# Patient Record
Sex: Female | Born: 1935
Health system: Southern US, Community
[De-identification: ages and names within clinical notes are randomized; demographics above are authoritative.]

## PROBLEM LIST (undated history)

## (undated) DIAGNOSIS — F329 Major depressive disorder, single episode, unspecified: Secondary | ICD-10-CM

## (undated) DIAGNOSIS — I509 Heart failure, unspecified: Secondary | ICD-10-CM

## (undated) DIAGNOSIS — I10 Essential (primary) hypertension: Secondary | ICD-10-CM

## (undated) DIAGNOSIS — I739 Peripheral vascular disease, unspecified: Secondary | ICD-10-CM

## (undated) DIAGNOSIS — L570 Actinic keratosis: Secondary | ICD-10-CM

## (undated) DIAGNOSIS — I8390 Asymptomatic varicose veins of unspecified lower extremity: Secondary | ICD-10-CM

## (undated) DIAGNOSIS — W009XXA Unspecified fall due to ice and snow, initial encounter: Secondary | ICD-10-CM

## (undated) DIAGNOSIS — I499 Cardiac arrhythmia, unspecified: Secondary | ICD-10-CM

## (undated) DIAGNOSIS — I6529 Occlusion and stenosis of unspecified carotid artery: Secondary | ICD-10-CM

## (undated) DIAGNOSIS — K635 Polyp of colon: Secondary | ICD-10-CM

## (undated) DIAGNOSIS — R7611 Nonspecific reaction to tuberculin skin test without active tuberculosis: Secondary | ICD-10-CM

## (undated) DIAGNOSIS — E079 Disorder of thyroid, unspecified: Secondary | ICD-10-CM

## (undated) DIAGNOSIS — I350 Nonrheumatic aortic (valve) stenosis: Secondary | ICD-10-CM

## (undated) DIAGNOSIS — R011 Cardiac murmur, unspecified: Secondary | ICD-10-CM

## (undated) DIAGNOSIS — J189 Pneumonia, unspecified organism: Secondary | ICD-10-CM

## (undated) DIAGNOSIS — J029 Acute pharyngitis, unspecified: Secondary | ICD-10-CM

## (undated) DIAGNOSIS — T7840XA Allergy, unspecified, initial encounter: Secondary | ICD-10-CM

## (undated) DIAGNOSIS — Z780 Asymptomatic menopausal state: Secondary | ICD-10-CM

## (undated) DIAGNOSIS — C9000 Multiple myeloma not having achieved remission: Secondary | ICD-10-CM

## (undated) HISTORY — DX: Cardiac arrhythmia, unspecified: I49.9

## (undated) HISTORY — DX: Pneumonia, unspecified organism: J18.9

## (undated) HISTORY — DX: Acute pharyngitis, unspecified: J02.9

## (undated) HISTORY — DX: Actinic keratosis: L57.0

## (undated) HISTORY — DX: Asymptomatic varicose veins of unspecified lower extremity: I83.90

## (undated) HISTORY — DX: Peripheral vascular disease, unspecified: I73.9

## (undated) HISTORY — DX: Cardiac murmur, unspecified: R01.1

## (undated) HISTORY — DX: Allergy, unspecified, initial encounter: T78.40XA

## (undated) HISTORY — DX: Occlusion and stenosis of unspecified carotid artery: I65.29

## (undated) HISTORY — DX: Nonrheumatic aortic (valve) stenosis: I35.0

## (undated) HISTORY — PX: TONSILLECTOMY: SUR1361

## (undated) HISTORY — PX: ABDOMINAL HYSTERECTOMY: SHX81

## (undated) HISTORY — DX: Essential (primary) hypertension: I10

## (undated) HISTORY — DX: Unspecified fall due to ice and snow, initial encounter: W00.9XXA

## (undated) HISTORY — DX: Polyp of colon: K63.5

## (undated) HISTORY — DX: Major depressive disorder, single episode, unspecified: F32.9

## (undated) HISTORY — DX: Nonspecific reaction to tuberculin skin test without active tuberculosis: R76.11

## (undated) HISTORY — DX: Disorder of thyroid, unspecified: E07.9

## (undated) HISTORY — DX: Multiple myeloma not having achieved remission: C90.00

## (undated) HISTORY — DX: Asymptomatic menopausal state: Z78.0

## (undated) HISTORY — PX: APPENDECTOMY: SHX54

## (undated) HISTORY — PX: VEIN SURGERY: SHX48

## (undated) HISTORY — DX: Heart failure, unspecified: I50.9

---

## 1962-10-02 HISTORY — PX: BILATERAL SALPINGOOPHORECTOMY: SHX1223

## 2001-12-17 ENCOUNTER — Encounter: Payer: Self-pay | Admitting: Orthopedic Surgery

## 2001-12-17 ENCOUNTER — Ambulatory Visit (HOSPITAL_COMMUNITY): Admission: RE | Admit: 2001-12-17 | Discharge: 2001-12-17 | Payer: Self-pay | Admitting: Orthopedic Surgery

## 2001-12-19 ENCOUNTER — Inpatient Hospital Stay (HOSPITAL_COMMUNITY): Admission: RE | Admit: 2001-12-19 | Discharge: 2001-12-24 | Payer: Self-pay | Admitting: Orthopedic Surgery

## 2001-12-23 ENCOUNTER — Encounter: Payer: Self-pay | Admitting: Orthopedic Surgery

## 2004-09-06 ENCOUNTER — Ambulatory Visit: Payer: Self-pay | Admitting: Unknown Physician Specialty

## 2004-11-23 ENCOUNTER — Ambulatory Visit: Payer: Self-pay | Admitting: Internal Medicine

## 2006-03-20 ENCOUNTER — Ambulatory Visit: Payer: Self-pay | Admitting: Internal Medicine

## 2006-04-01 ENCOUNTER — Emergency Department: Payer: Self-pay | Admitting: General Practice

## 2007-05-09 ENCOUNTER — Ambulatory Visit: Payer: Self-pay | Admitting: Internal Medicine

## 2007-12-13 ENCOUNTER — Ambulatory Visit: Payer: Self-pay | Admitting: Family Medicine

## 2008-05-11 ENCOUNTER — Ambulatory Visit: Payer: Self-pay | Admitting: Internal Medicine

## 2008-11-09 ENCOUNTER — Ambulatory Visit: Payer: Self-pay | Admitting: General Surgery

## 2008-11-18 ENCOUNTER — Ambulatory Visit: Payer: Self-pay | Admitting: General Surgery

## 2009-02-20 LAB — HM COLONOSCOPY: HM COLON: NORMAL

## 2009-05-12 ENCOUNTER — Ambulatory Visit: Payer: Self-pay | Admitting: General Surgery

## 2009-08-12 ENCOUNTER — Ambulatory Visit: Payer: Self-pay | Admitting: Internal Medicine

## 2009-10-07 ENCOUNTER — Ambulatory Visit: Payer: Self-pay | Admitting: Internal Medicine

## 2010-05-31 ENCOUNTER — Ambulatory Visit: Payer: Self-pay | Admitting: Internal Medicine

## 2011-06-19 ENCOUNTER — Ambulatory Visit: Payer: Medicare Other | Admitting: Internal Medicine

## 2011-08-04 ENCOUNTER — Other Ambulatory Visit: Payer: Self-pay

## 2011-08-04 DIAGNOSIS — I6529 Occlusion and stenosis of unspecified carotid artery: Secondary | ICD-10-CM

## 2011-08-18 ENCOUNTER — Encounter: Payer: Self-pay | Admitting: Vascular Surgery

## 2011-09-14 ENCOUNTER — Encounter: Payer: Self-pay | Admitting: Vascular Surgery

## 2011-09-15 ENCOUNTER — Other Ambulatory Visit (INDEPENDENT_AMBULATORY_CARE_PROVIDER_SITE_OTHER): Payer: Medicare Other | Admitting: *Deleted

## 2011-09-15 ENCOUNTER — Encounter: Payer: Self-pay | Admitting: Vascular Surgery

## 2011-09-15 ENCOUNTER — Ambulatory Visit (INDEPENDENT_AMBULATORY_CARE_PROVIDER_SITE_OTHER): Payer: Medicare Other | Admitting: Vascular Surgery

## 2011-09-15 VITALS — BP 170/87 | HR 65 | Resp 16 | Ht 64.0 in | Wt 127.0 lb

## 2011-09-15 DIAGNOSIS — I6529 Occlusion and stenosis of unspecified carotid artery: Secondary | ICD-10-CM

## 2011-09-15 DIAGNOSIS — I6523 Occlusion and stenosis of bilateral carotid arteries: Secondary | ICD-10-CM

## 2011-09-15 DIAGNOSIS — I739 Peripheral vascular disease, unspecified: Secondary | ICD-10-CM | POA: Insufficient documentation

## 2011-09-15 NOTE — Progress Notes (Signed)
[facility] [place]  New Carotid Patient  Referred by:  Dr. Beverely Risen  Reason for referral: B carotid stenosis  History of Present Illness  Deanna White is a 77 y.o. female who presents with chief complaint: abnormal carotid duplex.  Previous carotid studies demonstrated: RICA  minimal stenosis, LICA 50-60% stenosis.  Patient has no history of TIA or stroke symptom.  The patient has never had amaurosis fugax or monocular blindness.  The patient has never had facial drooping or hemiplegia.  The patient has never had receptive or expressive aphasia.   The patient's previous neurologic deficits have resolved.  The patient's risks factors for carotid disease include: HTN.  Past Medical History  Diagnosis Date  . Carotid artery occlusion   . Depression   . Hypertension   . Thyroid disease   . Menopause   . Allergy   . Heart murmur   . Varicose veins   . Sore throat   . Pneumonia     Past Surgical History  Procedure Date  . Tonsillectomy   . Cesarean section   . Appendectomy   . Abdominal hysterectomy     History   Social History  . Marital Status: Divorced    Spouse Name: N/A    Number of Children: N/A  . Years of Education: N/A   Occupational History  . Not on file.   Social History Main Topics  . Smoking status: Never Smoker   . Smokeless tobacco: Not on file  . Alcohol Use: No  . Drug Use: No  . Sexually Active:    Other Topics Concern  . Not on file   Social History Narrative  . No narrative on file    Family History  Problem Relation Age of Onset  . Heart disease Mother   . Kidney disease Father   . Alcohol abuse Brother   . Cancer Brother   . Other Other     epilepsy  . Hypertension Other     Current Outpatient Prescriptions on File Prior to Visit  Medication Sig Dispense Refill  . Ascorbic Acid (VITAMIN C PO) Take by mouth.        Marland Kitchen aspirin EC 81 MG tablet Take 81 mg by mouth daily.        . Cholecalciferol (VITAMIN  D PO) Take by mouth daily.        Marland Kitchen levothyroxine (SYNTHROID, LEVOTHROID) 75 MCG tablet Take 75 mcg by mouth daily.        . Multiple Vitamins-Calcium (ONE-A-DAY WOMENS PO) Take by mouth daily.        Marland Kitchen telmisartan (MICARDIS) 40 MG tablet Take 40 mg by mouth daily.        Marland Kitchen HYDRALAZINE-HCTZ PO Take by mouth daily.          Allergies  Allergen Reactions  . Adhesive (Tape)     REVIEW OF SYSTEMS:  (Positives checked otherwise negative)  CARDIOVASCULAR: [ ]  chest pain    [ ]  chest pressure    [ ]  palpitations    [ ]  orthopnea   [x]  dyspnea on exert. [ ]  claudication    [ ]  rest pain     [ ]  DVT     [ ]  phlebitis  PULMONARY:    [ ]  productive cough [ ]  asthma  [ ]  wheezing  NEUROLOGIC:    [ ]  weakness    [ ]  paresthesias   [ ]  aphasia    [ ]  amaurosis    [ ]   dizziness  HEMATOLOGIC:    [ ]  bleeding problems  [ ]  clotting disorders  MUSCULOSKEL: [ ]  joint pain     [ ]  joint swelling  GASTROINTEST:  [ ]   blood in stool   [ ]   hematemesis  GENITOURINARY:   [ ]   dysuria    [ ]   hematuria  PSYCHIATRIC:   [ ]  history of major depression  INTEGUMENTARY: [ ]  rashes    [ ]  ulcers  CONSTITUTIONAL:  [ ]  fever     [ ]  chills  Physical Examination  Filed Vitals:   09/15/11 1117 09/15/11 1120  BP: 167/76 170/87  Pulse: 70 65  Resp: 16   Height: 5\' 4"  (1.626 m)   Weight: 127 lb (57.607 kg)   SpO2: 99% 100%   Body mass index is 21.80 kg/(m^2).  General: A&O x 3, WDWN, elderly, Cachectic   Head: [place]/AT, B temporalis wasting  Ear/Nose/Throat: Hearing grossly intact, nares w/o erythema or drainage, oropharynx w/o Erythema/Exudate  Eyes: PERRLA, EOMI  Neck: Supple, no nuchal rigidity, no palpable LAD  Pulmonary: Sym exp, good air movt, CTAB, no rales, rhonchi, & wheezing  Cardiac: RRR, Nl S1, S2, no Murmurs, rubs or gallops  Vascular: Vessel Right Left  Radial Palpable Palpable  Brachial Palpable Palpable  Carotid Palpable, without bruit Palpable, without bruit  Aorta  Non-palpable N/A  Femoral Palpable Palpable  Popliteal Non-palpable Non-palpable  PT Palpable Palpable  DP Palpable Palpable   Gastrointestinal: soft, NTND, -G/R, - HSM, - masses, - CVAT B  Musculoskeletal: M/S 5/5 throughout , Extremities without ischemic changes   Neurologic: CN 2-12 intact , Pain and light touch intact in extremities , Motor exam as listed above  Psychiatric: Judgment intact, Mood & affect appropriate for pt's clinical situation  Dermatologic: See M/S exam for extremity exam, no rashes otherwise noted  Lymph : No Cervical, Axillary, or Inguinal lymphadenopathy   Non-Invasive Vascular Imaging  CAROTID DUPLEX (Date: 09/15/11):   R ICA stenosis: <50%  R VA: patent and antegrade  L ICA stenosis: <50%  L VA: patent and antegrade  Outside Studies/Documentation 6 pages of outside documents were reviewed including: clinic chart and outside duplex report.  Medical Decision Making  Deanna White is a 77 y.o. female who presents with: <50% B ICA stenosis.   Based on the patient's vascular studies and examination, I have offered the patient: continued carotid surveillance. Based on ACAS data, there is no indication for intervention with <50% lesions bilaterally.  I discussed in depth with the patient the nature of atherosclerosis, and emphasized the importance of maximal medical management including strict control of blood pressure, blood glucose, and lipid levels, obtaining regular exercise, antiplatelet agents, and cessation of smoking.  The patient is aware that without maximal medical management the underlying atherosclerotic disease process will progress, limiting the benefit of any interventions.  Thank you for allowing [place] to participate in this patient's care.  Leonides Sake, MD [facility] [place] Office: [phone] Pager: [phone]  09/15/2011, 12:37 PM

## 2011-09-22 NOTE — Procedures (Unsigned)
CAROTID DUPLEX EXAM  INDICATION:  Carotid disease.  HISTORY: Diabetes:  No. Cardiac:  No. Hypertension:  Yes. Smoking:  No. Previous Surgery:  No. CV History:  Currently asymptomatic. Amaurosis Fugax No, Paresthesias No, Hemiparesis No.                                      RIGHT             LEFT Brachial systolic pressure:         180               182 Brachial Doppler waveforms:         Normal            Normal Vertebral direction of flow:        Antegrade         Antegrade DUPLEX VELOCITIES (cm/sec) CCA peak systolic                   69                69 ECA peak systolic                   96                87 ICA peak systolic                   61                P = 81/M = 121 ICA end diastolic                   17                T = 20/M = 34 PLAQUE MORPHOLOGY:                  Heterogenous      Heterogenous PLAQUE AMOUNT:                      Mild              Mild PLAQUE LOCATION:                    Proximal ICA      CCA/proximal ICA  IMPRESSION:  No hemodynamically significant stenoses of the bilateral proximal internal carotid arteries with plaque formations as described above.  There is a mild increase in velocity of the left mid internal carotid artery which appears to be due to vessel tortuosity.    ___________________________________________ Fransisco Hertz, MD  CH/MEDQ  D:  09/15/2011  T:  09/15/2011  Job:  [id number]

## 2012-06-19 ENCOUNTER — Ambulatory Visit: Payer: Self-pay | Admitting: Internal Medicine

## 2012-09-18 ENCOUNTER — Other Ambulatory Visit: Payer: Self-pay | Admitting: *Deleted

## 2012-09-18 DIAGNOSIS — I6529 Occlusion and stenosis of unspecified carotid artery: Secondary | ICD-10-CM

## 2012-09-19 ENCOUNTER — Encounter: Payer: Self-pay | Admitting: Vascular Surgery

## 2012-09-20 ENCOUNTER — Other Ambulatory Visit (INDEPENDENT_AMBULATORY_CARE_PROVIDER_SITE_OTHER): Payer: Medicare Other | Admitting: *Deleted

## 2012-09-20 ENCOUNTER — Encounter: Payer: Self-pay | Admitting: Vascular Surgery

## 2012-09-20 ENCOUNTER — Ambulatory Visit (INDEPENDENT_AMBULATORY_CARE_PROVIDER_SITE_OTHER): Payer: Medicare Other | Admitting: Vascular Surgery

## 2012-09-20 VITALS — BP 191/72 | HR 72 | Ht 64.0 in | Wt 128.0 lb

## 2012-09-20 DIAGNOSIS — I6529 Occlusion and stenosis of unspecified carotid artery: Secondary | ICD-10-CM

## 2012-09-20 DIAGNOSIS — I658 Occlusion and stenosis of other precerebral arteries: Secondary | ICD-10-CM

## 2012-09-20 DIAGNOSIS — I6523 Occlusion and stenosis of bilateral carotid arteries: Secondary | ICD-10-CM

## 2012-09-20 NOTE — Progress Notes (Signed)
[facility] [place]  Established Carotid Patient  History of Present Illness  Deanna White is a 78 y.o. (1925/08/29) female who presents with chief complaint: left ear tinnitus.  Previous carotid studies demonstrated: RICA <50% stenosis, LICA <50% stenosis.  Patient has no history of TIA or stroke symptom.  The patient has never had amaurosis fugax or monocular blindness.  The patient has never had facial drooping or hemiplegia.  The patient has never had receptive or expressive aphasia.    Past Medical History, Past Surgical History, Social History, Family History, Medications, Allergies, and Review of Systems are unchanged from previous evaluation on 09/15/11.  Physical Examination  Filed Vitals:   09/20/12 1002 09/20/12 1004  BP: 186/57 191/72  Pulse: 72   Height: 5\' 4"  (1.626 m)   Weight: 128 lb (58.06 kg)   SpO2: 100%    Body mass index is 21.97 kg/(m^2).  General: A&O x 3, WD , elderly  Eyes: PERRLA, EOMI  Pulmonary: Sym exp, good air movt, CTAB, no rales, rhonchi, & wheezing  Cardiac: RRR, Nl S1, S2, no Murmurs, rubs or gallops  Vascular: Vessel Right Left  Radial Palpable Palpable  Ulnar Palpable Palpable  Brachial Palpable Palpable  Carotid Palpable, without bruit Palpable, without bruit  Aorta Not palpable N/A  Femoral Palpable Palpable  Popliteal Not palpable Not palpable  PT Palpable Palpable  DP Palpable Palpable   Gastrointestinal: soft, NTND, -G/R, - HSM, - masses, - CVAT B  Musculoskeletal: M/S 5/5 throughout , Extremities without ischemic changes   Neurologic: CN 2-12 intact , Pain and light touch intact in extremities , Motor exam as listed above  Non-Invasive Vascular Imaging  CAROTID DUPLEX (Date: 09/20/12  R ICA stenosis: <40%  R VA:  patent and antegrade  L ICA stenosis: <40%  L VA: patent and antegrade  Medical Decision Making  Deanna White is a 78 y.o. female who presents with: minimal B ICA  stenosis.   Based on the patient's vascular studies and examination, I have offered the patient: q2 year surveillance.  I discussed in depth with the patient the nature of atherosclerosis, and emphasized the importance of maximal medical management including strict control of blood pressure, blood glucose, and lipid levels, antiplatelet agents, obtaining regular exercise, and cessation of smoking.  The patient is aware that without maximal medical management the underlying atherosclerotic disease process will progress, limiting the benefit of any interventions.  Thank you for allowing [place] to participate in this patient's care.  Leonides Sake, MD [facility] [place] Office: [phone] Pager: [phone]  09/20/2012, 10:20 AM

## 2012-09-23 NOTE — Addendum Note (Signed)
Addended by: Sharee Pimple on: 09/23/2012 02:30 PM   Modules accepted: Orders

## 2013-06-20 ENCOUNTER — Ambulatory Visit: Payer: Self-pay

## 2013-06-23 LAB — HM MAMMOGRAPHY: HM Mammogram: NORMAL

## 2013-11-02 DIAGNOSIS — W009XXA Unspecified fall due to ice and snow, initial encounter: Secondary | ICD-10-CM

## 2013-11-02 HISTORY — DX: Unspecified fall due to ice and snow, initial encounter: W00.9XXA

## 2013-12-04 ENCOUNTER — Ambulatory Visit: Payer: Self-pay

## 2014-02-20 ENCOUNTER — Encounter: Payer: Self-pay | Admitting: Internal Medicine

## 2014-02-20 ENCOUNTER — Encounter (INDEPENDENT_AMBULATORY_CARE_PROVIDER_SITE_OTHER): Payer: Self-pay

## 2014-02-20 ENCOUNTER — Ambulatory Visit (INDEPENDENT_AMBULATORY_CARE_PROVIDER_SITE_OTHER): Payer: Medicare Other | Admitting: Internal Medicine

## 2014-02-20 VITALS — BP 150/60 | HR 70 | Temp 97.9°F | Resp 16 | Ht 62.0 in | Wt 119.5 lb

## 2014-02-20 DIAGNOSIS — Z8781 Personal history of (healed) traumatic fracture: Secondary | ICD-10-CM

## 2014-02-20 DIAGNOSIS — E785 Hyperlipidemia, unspecified: Secondary | ICD-10-CM

## 2014-02-20 DIAGNOSIS — Z23 Encounter for immunization: Secondary | ICD-10-CM

## 2014-02-20 DIAGNOSIS — E559 Vitamin D deficiency, unspecified: Secondary | ICD-10-CM

## 2014-02-20 DIAGNOSIS — M810 Age-related osteoporosis without current pathological fracture: Secondary | ICD-10-CM

## 2014-02-20 DIAGNOSIS — I658 Occlusion and stenosis of other precerebral arteries: Secondary | ICD-10-CM

## 2014-02-20 DIAGNOSIS — E039 Hypothyroidism, unspecified: Secondary | ICD-10-CM

## 2014-02-20 DIAGNOSIS — I10 Essential (primary) hypertension: Secondary | ICD-10-CM

## 2014-02-20 DIAGNOSIS — M5430 Sciatica, unspecified side: Secondary | ICD-10-CM

## 2014-02-20 DIAGNOSIS — I6529 Occlusion and stenosis of unspecified carotid artery: Secondary | ICD-10-CM

## 2014-02-20 DIAGNOSIS — R5383 Other fatigue: Secondary | ICD-10-CM

## 2014-02-20 DIAGNOSIS — R319 Hematuria, unspecified: Secondary | ICD-10-CM

## 2014-02-20 DIAGNOSIS — M5431 Sciatica, right side: Secondary | ICD-10-CM

## 2014-02-20 DIAGNOSIS — M5440 Lumbago with sciatica, unspecified side: Secondary | ICD-10-CM

## 2014-02-20 DIAGNOSIS — I6523 Occlusion and stenosis of bilateral carotid arteries: Secondary | ICD-10-CM

## 2014-02-20 DIAGNOSIS — R5381 Other malaise: Secondary | ICD-10-CM

## 2014-02-20 LAB — LIPID PANEL
Cholesterol: 171 mg/dL (ref 0–200)
HDL: 59 mg/dL (ref >39.00)
LDL Cholesterol: 96 mg/dL (ref 0–99)
TRIGLYCERIDES: 78 mg/dL (ref 0.0–149.0)
Total CHOL/HDL Ratio: 3
VLDL: 15.6 mg/dL (ref 0.0–40.0)

## 2014-02-20 LAB — COMPREHENSIVE METABOLIC PANEL
ALT: 14 U/L (ref 0–35)
AST: 20 U/L (ref 0–37)
Albumin: 3.9 g/dL (ref 3.5–5.2)
Alkaline Phosphatase: 54 U/L (ref 39–117)
BUN: 23 mg/dL (ref 6–23)
CHLORIDE: 105 meq/L (ref 96–112)
CO2: 26 meq/L (ref 19–32)
Calcium: 9.3 mg/dL (ref 8.4–10.5)
Creatinine, Ser: 0.9 mg/dL (ref 0.4–1.2)
GFR: 65.55 mL/min (ref >60.00)
Glucose, Bld: 87 mg/dL (ref 70–99)
Potassium: 4.7 mEq/L (ref 3.5–5.1)
Sodium: 142 mEq/L (ref 135–145)
Total Bilirubin: 0.7 mg/dL (ref 0.2–1.2)
Total Protein: 5.8 g/dL — ABNORMAL LOW (ref 6.0–8.3)

## 2014-02-20 LAB — TSH: TSH: 0.1 u[IU]/mL — ABNORMAL LOW (ref 0.35–4.50)

## 2014-02-20 MED ORDER — LOSARTAN POTASSIUM 50 MG PO TABS: 50.0000 mg | ORAL_TABLET | Freq: Every day | ORAL | Status: DC

## 2014-02-20 NOTE — Progress Notes (Signed)
Pre visit review using our clinic review tool, if applicable. No additional management support is needed unless otherwise documented below in the visit note. 

## 2014-02-20 NOTE — Patient Instructions (Addendum)
I recommend getting the majority of your calcium and Vitamin D  through diet rather than supplements given the recent association of calcium supplements with increased coronary artery calcium scores  Unsweetened almond/coconut milk is a great low calorie low carb way to increase your dietary calcium and vitamin D.  Try the [employer]    I recommend you try taking 2 ibuprofen and 1 tylenol at bedtime for you back pain.  You can repeat this in the morning and afternoon if it helps your back.  If this does not help, we should consider getting an MRI of yoru lumbar spine to rule out spinal stenosis   You revieced the new pneumonia vaccine  I have changed your blood pressure medication to losartan.  It is less strong and less $$$ than telmisartan

## 2014-02-20 NOTE — Progress Notes (Addendum)
[name], female   DOB: August 14, 1925, 80 y.o.   MRN: [id number] Patient Active Problem List   Diagnosis Date Noted  . Essential hypertension, benign 02/20/2014  . Carotid stenosis, bilateral 09/15/2011    Subjective:  CC:   Chief Complaint  Patient presents with  . Establish Care    HPI:   Deanna White a 80 y.o. female who presents with a Chief complaint of fatigue.    Occasional insomnia.  .  Starts feeling tired with activity "tired in my back"  Can't vacuum the house any more, with out having to stop to rest. Gets worn out grocery shopping,   Can't do her yard work anymore which included mowing her lawn with a riding lawn mower, still manageymptoms s to rake her own leaves.   Symptoms started after a fall on February 22 down icy steps,  Fell backward in her carport which resulted in vertebral fractures in the lumbar spine which were found several weeks later during an evaluation for proteinuria and hematuria found during screening by a home health RN .  Her pain is brought on by twisting, turning and stooping over. Pain is focused in the Right sciatic and goes to the  knee but not below.  She has had a prior trial of  Some daily medication for treatment of  osteopenia,  (not sure of name) but stopped it after a month,  Takes a One a Day vitamin,  No calcium or vitamin D supplementation .    Does drive.   Sisters are [employer] foster and Customer service manager.  Prior PCP was Dr Chancy Milroy but has been seeing her PA for many years.     Past Medical History  Diagnosis Date  . Carotid artery occlusion   . Depression   . Hypertension   . Thyroid disease   . Menopause   . Allergy   . Heart murmur   . Varicose veins   . Sore throat   . Pneumonia   . Colon polyps   . Positive TB test       Allergies  Allergen Reactions  . Adhesive [Tape]      Past Surgical History  Procedure Laterality Date  . Tonsillectomy    . Cesarean section    . Appendectomy    . Abdominal  hysterectomy    . Bilateral salpingoophorectomy  1964    History   Social History  . Marital Status: Divorced    Spouse Name: N/A    Number of Children: N/A  . Years of Education: N/A   Occupational History  . Not on file.   Social History Main Topics  . Smoking status: Never Smoker   . Smokeless tobacco: Never Used  . Alcohol Use: No  . Drug Use: No  . Sexual Activity: Not on file   Other Topics Concern  . Not on file   Social History Narrative  . No narrative on file   Family History  Problem Relation Age of Onset  . Heart disease Mother   . Hypertension Mother   . Kidney disease Father   . Alcohol abuse Brother   . Other Other     epilepsy  . Hypertension Other   . Cancer Paternal Grandmother     breast cancer  . Cancer Brother     Lung cancer  . Cancer Brother     kidney cancer   Review of Systems:   The rest of the review of systems was  negative except those addressed in the HPI.      Objective:  BP 150/60  Pulse 70  Temp(Src) 97.9 F (36.6 C) (Oral)  Resp 16  Ht 5\' 2"  (1.575 m)  Wt 119 lb 8 oz (54.205 kg)  BMI 21.85 kg/m2  SpO2 98%  General appearance: alert, cooperative and appears stated age Ears: normal TM's and external ear canals both ears Throat: lips, mucosa, and tongue normal; teeth and gums normal Neck: no adenopathy, no carotid bruit, supple, symmetrical, trachea midline and thyroid not enlarged, symmetric, no tenderness/mass/nodules Back: symmetric, no curvature. ROM normal. No CVA tenderness. Lungs: clear to auscultation bilaterally Heart: regular rate and rhythm, S1, S2 normal, no murmur, click, rub or gallop Abdomen: soft, non-tender; bowel sounds normal; no masses,  no organomegaly Pulses: 2+ and symmetric Skin: Skin color, texture, turgor normal. No rashes or lesions Lymph nodes: Cervical, supraclavicular, and axillary nodes normal.  Assessment and Plan:  Essential hypertension, benign Well controlled on current  regimen. Renal function normal, changing to losartan for cost savings.  Lab Results  Component Value Date   CREATININE 0.9 02/20/2014    Lab Results  Component Value Date   NA 142 02/20/2014   K 4.7 02/20/2014   CL 105 02/20/2014   CO2 26 02/20/2014     Carotid stenosis, bilateral Lipids are normal.  continue aspirin   Osteoporosis, unspecified With prior vertebral fracture after a fall.  Records requested,  Will need a DEXA scan   Lumbago with sciatica Neurologic exam is normal despite symptoms of sciatica .  Referral to PT/   Updated Medication List Outpatient Encounter Prescriptions as of 02/20/2014  Medication Sig  . Ascorbic Acid (VITAMIN C PO) Take 500 mg by mouth daily.   Marland Kitchen aspirin EC 81 MG tablet Take 81 mg by mouth daily.    Marland Kitchen ketoconazole (NIZORAL) 2 % cream Apply 1 application topically daily. Feet  . levothyroxine (SYNTHROID, LEVOTHROID) 75 MCG tablet Take 75 mcg by mouth daily.    . Multiple Vitamins-Minerals ([employer] SPECTRAVITE PO) Take 1 tablet by mouth daily.  . [DISCONTINUED] telmisartan (MICARDIS) 40 MG tablet Take 40 mg by mouth daily. 1/2 tablet  . losartan (COZAAR) 50 MG tablet Take 1 tablet (50 mg total) by mouth daily.  . [DISCONTINUED] calcium-vitamin D (OSCAL WITH D) 500-200 MG-UNIT per tablet Take 1 tablet by mouth daily.    . [DISCONTINUED] Cholecalciferol (VITAMIN D PO) Take by mouth daily.    . [DISCONTINUED] HYDRALAZINE-HCTZ PO Take by mouth daily.    . [DISCONTINUED] Multiple Vitamins-Calcium (ONE-A-DAY WOMENS PO) Take by mouth daily.

## 2014-02-21 LAB — VITAMIN D 25 HYDROXY (VIT D DEFICIENCY, FRACTURES): Vit D, 25-Hydroxy: 56 ng/mL (ref 30–89)

## 2014-02-22 DIAGNOSIS — M810 Age-related osteoporosis without current pathological fracture: Secondary | ICD-10-CM | POA: Insufficient documentation

## 2014-02-22 MED ORDER — LEVOTHYROXINE SODIUM 50 MCG PO TABS: 50.0000 ug | ORAL_TABLET | Freq: Every day | ORAL | Status: DC

## 2014-02-22 NOTE — Assessment & Plan Note (Signed)
Lipids are normal.  continue aspirin

## 2014-02-22 NOTE — Addendum Note (Signed)
Addended by: Crecencio Mc on: 02/22/2014 10:27 PM   Modules accepted: Orders

## 2014-02-22 NOTE — Assessment & Plan Note (Addendum)
Well controlled on current regimen. Renal function normal, changing to losartan for cost savings.  Lab Results  Component Value Date   CREATININE 0.9 02/20/2014    Lab Results  Component Value Date   NA 142 02/20/2014   K 4.7 02/20/2014   CL 105 02/20/2014   CO2 26 02/20/2014

## 2014-02-22 NOTE — Assessment & Plan Note (Signed)
With prior vertebral fracture after a fall.  Records requested,  Will need a DEXA scan

## 2014-02-22 NOTE — Assessment & Plan Note (Signed)
Neurologic exam is normal despite symptoms of sciatica .  Referral to PT/

## 2014-02-24 ENCOUNTER — Telehealth: Payer: Self-pay | Admitting: Internal Medicine

## 2014-02-24 NOTE — Telephone Encounter (Signed)
Relevant patient education mailed to patient.  

## 2014-03-04 ENCOUNTER — Encounter: Payer: Self-pay | Admitting: Internal Medicine

## 2014-04-06 ENCOUNTER — Other Ambulatory Visit (INDEPENDENT_AMBULATORY_CARE_PROVIDER_SITE_OTHER): Payer: Medicare Other

## 2014-04-06 ENCOUNTER — Telehealth: Payer: Self-pay | Admitting: *Deleted

## 2014-04-06 DIAGNOSIS — E039 Hypothyroidism, unspecified: Secondary | ICD-10-CM

## 2014-04-06 DIAGNOSIS — I10 Essential (primary) hypertension: Secondary | ICD-10-CM

## 2014-04-06 DIAGNOSIS — I6523 Occlusion and stenosis of bilateral carotid arteries: Secondary | ICD-10-CM

## 2014-04-06 DIAGNOSIS — Z7901 Long term (current) use of anticoagulants: Secondary | ICD-10-CM

## 2014-04-06 DIAGNOSIS — Z79899 Other long term (current) drug therapy: Secondary | ICD-10-CM

## 2014-04-06 DIAGNOSIS — E785 Hyperlipidemia, unspecified: Secondary | ICD-10-CM

## 2014-04-06 DIAGNOSIS — M810 Age-related osteoporosis without current pathological fracture: Secondary | ICD-10-CM

## 2014-04-06 LAB — COMPREHENSIVE METABOLIC PANEL
ALK PHOS: 52 U/L (ref 39–117)
ALT: 16 U/L (ref 0–35)
AST: 23 U/L (ref 0–37)
Albumin: 3.9 g/dL (ref 3.5–5.2)
BILIRUBIN TOTAL: 0.4 mg/dL (ref 0.2–1.2)
BUN: 16 mg/dL (ref 6–23)
CO2: 28 mEq/L (ref 19–32)
CREATININE: 1 mg/dL (ref 0.4–1.2)
Calcium: 9.6 mg/dL (ref 8.4–10.5)
Chloride: 104 mEq/L (ref 96–112)
GFR: 54.54 mL/min — ABNORMAL LOW (ref >60.00)
Glucose, Bld: 97 mg/dL (ref 70–99)
Potassium: 4.9 mEq/L (ref 3.5–5.1)
Sodium: 140 mEq/L (ref 135–145)
Total Protein: 5.8 g/dL — ABNORMAL LOW (ref 6.0–8.3)

## 2014-04-06 LAB — LIPID PANEL
Cholesterol: 189 mg/dL (ref 0–200)
HDL: 69 mg/dL (ref >39.00)
LDL CALC: 100 mg/dL — AB (ref 0–99)
NonHDL: 120
TRIGLYCERIDES: 99 mg/dL (ref 0.0–149.0)
Total CHOL/HDL Ratio: 3
VLDL: 19.8 mg/dL (ref 0.0–40.0)

## 2014-04-06 NOTE — Telephone Encounter (Signed)
What labs and dx?  

## 2014-04-09 ENCOUNTER — Encounter: Payer: Self-pay | Admitting: *Deleted

## 2014-05-28 ENCOUNTER — Ambulatory Visit (INDEPENDENT_AMBULATORY_CARE_PROVIDER_SITE_OTHER): Payer: Medicare Other | Admitting: Internal Medicine

## 2014-05-28 ENCOUNTER — Encounter: Payer: Self-pay | Admitting: Internal Medicine

## 2014-05-28 VITALS — BP 152/60 | HR 60 | Temp 98.1°F | Resp 14 | Ht 62.0 in | Wt 118.5 lb

## 2014-05-28 DIAGNOSIS — B9789 Other viral agents as the cause of diseases classified elsewhere: Secondary | ICD-10-CM

## 2014-05-28 DIAGNOSIS — E039 Hypothyroidism, unspecified: Secondary | ICD-10-CM

## 2014-05-28 DIAGNOSIS — I10 Essential (primary) hypertension: Secondary | ICD-10-CM

## 2014-05-28 DIAGNOSIS — R5381 Other malaise: Secondary | ICD-10-CM

## 2014-05-28 DIAGNOSIS — J029 Acute pharyngitis, unspecified: Secondary | ICD-10-CM

## 2014-05-28 DIAGNOSIS — M810 Age-related osteoporosis without current pathological fracture: Secondary | ICD-10-CM

## 2014-05-28 DIAGNOSIS — R5383 Other fatigue: Secondary | ICD-10-CM

## 2014-05-28 DIAGNOSIS — J028 Acute pharyngitis due to other specified organisms: Secondary | ICD-10-CM

## 2014-05-28 LAB — CBC WITH DIFFERENTIAL/PLATELET
BASOS PCT: 0.2 % (ref 0.0–3.0)
Basophils Absolute: 0 10*3/uL (ref 0.0–0.1)
Eosinophils Absolute: 0.4 10*3/uL (ref 0.0–0.7)
Eosinophils Relative: 4.6 % (ref 0.0–5.0)
HCT: 35.2 % — ABNORMAL LOW (ref 36.0–46.0)
HEMOGLOBIN: 11.7 g/dL — AB (ref 12.0–15.0)
LYMPHS ABS: 1.6 10*3/uL (ref 0.7–4.0)
Lymphocytes Relative: 20.8 % (ref 12.0–46.0)
MCHC: 33.3 g/dL (ref 30.0–36.0)
MCV: 97.5 fl (ref 78.0–100.0)
MONO ABS: 0.8 10*3/uL (ref 0.1–1.0)
Monocytes Relative: 10.5 % (ref 3.0–12.0)
Neutro Abs: 4.9 10*3/uL (ref 1.4–7.7)
Neutrophils Relative %: 63.9 % (ref 43.0–77.0)
Platelets: 158 10*3/uL (ref 150.0–400.0)
RBC: 3.61 Mil/uL — ABNORMAL LOW (ref 3.87–5.11)
RDW: 13.7 % (ref 11.5–15.5)
WBC: 7.7 10*3/uL (ref 4.0–10.5)

## 2014-05-28 MED ORDER — LOSARTAN POTASSIUM 100 MG PO TABS: 100.0000 mg | ORAL_TABLET | Freq: Every day | ORAL | Status: DC

## 2014-05-28 NOTE — Patient Instructions (Signed)
You have a viral pharyngitis.  You do not need antibiotics.  You can Try gargling with salt water to relieve the irritation in your throat  If you develop fever (t > 100.4) facial pain or ear pain,  You can Call for an antibiotic   You can try benadryl (dipenhydramine 25 mg ) 1 hour before bedtime for your sleep problems  I have increased your blood pressure medication to 100 mg daily.  You can take it at bedtime if you prefer.  I am ordering a bone density test to evaluate your risk for fracture   Please return in  afew weeks for your flu shot  6 months for your annual wellness exam   Hypertension Hypertension, commonly called high blood pressure, is when the force of blood pumping through your arteries is too strong. Your arteries are the blood vessels that carry blood from your heart throughout your body. A blood pressure reading consists of a higher number over a lower number, such as 110/72. The higher number (systolic) is the pressure inside your arteries when your heart pumps. The lower number (diastolic) is the pressure inside your arteries when your heart relaxes. Ideally you want your blood pressure below 120/80. Hypertension forces your heart to work harder to pump blood. Your arteries may become narrow or stiff. Having hypertension puts you at risk for heart disease, stroke, and other problems.  RISK FACTORS Some risk factors for high blood pressure are controllable. Others are not.  Risk factors you cannot control include:   Race. You may be at higher risk if you are African American.  Age. Risk increases with age.  Gender. Men are at higher risk than women before age 21 years. After age 40, women are at higher risk than men. Risk factors you can control include:  Not getting enough exercise or physical activity.  Being overweight.  Getting too much fat, sugar, calories, or salt in your diet.  Drinking too much alcohol. SIGNS AND SYMPTOMS Hypertension does not usually  cause signs or symptoms. Extremely high blood pressure (hypertensive crisis) may cause headache, anxiety, shortness of breath, and nosebleed. DIAGNOSIS  To check if you have hypertension, your health care provider will measure your blood pressure while you are seated, with your arm held at the level of your heart. It should be measured at least twice using the same arm. Certain conditions can cause a difference in blood pressure between your right and left arms. A blood pressure reading that is higher than normal on one occasion does not mean that you need treatment. If one blood pressure reading is high, ask your health care provider about having it checked again. TREATMENT  Treating high blood pressure includes making lifestyle changes and possibly taking medicine. Living a healthy lifestyle can help lower high blood pressure. You may need to change some of your habits. Lifestyle changes may include:  Following the DASH diet. This diet is high in fruits, vegetables, and whole grains. It is low in salt, red meat, and added sugars.  Getting at least 2 hours of brisk physical activity every week.  Losing weight if necessary.  Not smoking.  Limiting alcoholic beverages.  Learning ways to reduce stress. If lifestyle changes are not enough to get your blood pressure under control, your health care provider may prescribe medicine. You may need to take more than one. Work closely with your health care provider to understand the risks and benefits. HOME CARE INSTRUCTIONS  Have your blood pressure rechecked  as directed by your health care provider.   Take medicines only as directed by your health care provider. Follow the directions carefully. Blood pressure medicines must be taken as prescribed. The medicine does not work as well when you skip doses. Skipping doses also puts you at risk for problems.   Do not smoke.   Monitor your blood pressure at home as directed by your health care  provider. SEEK MEDICAL CARE IF:   You think you are having a reaction to medicines taken.  You have recurrent headaches or feel dizzy.  You have swelling in your ankles.  You have trouble with your vision. SEEK IMMEDIATE MEDICAL CARE IF:  You develop a severe headache or confusion.  You have unusual weakness, numbness, or feel faint.  You have severe chest or abdominal pain.  You vomit repeatedly.  You have trouble breathing. MAKE SURE YOU:   Understand these instructions.  Will watch your condition.  Will get help right away if you are not doing well or get worse. Document Released: 09/18/2005 Document Revised: 02/02/2014 Document Reviewed: 07/11/2013 [facility] Patient Information 2015 [place], [place]. This information is not intended to replace advice given to you by your health care provider. Make sure you discuss any questions you have with your health care provider.

## 2014-05-28 NOTE — Progress Notes (Signed)
Pre-visit discussion using our clinic review tool. No additional management support is needed unless otherwise documented below in the visit note.  

## 2014-05-28 NOTE — Assessment & Plan Note (Addendum)
With prior vertebral fracture after a fall.  Records requested,  Will repeat her  DEXA scan and order Prolia for therapy

## 2014-05-28 NOTE — Progress Notes (Addendum)
[name], female   DOB: [birth year], 80 y.o.   MRN: [id number]  Patient Active Problem List   Diagnosis Date Noted  . Acute viral pharyngitis 05/31/2014  . Postmenopausal osteoporosis 02/22/2014  . Vertebral fracture, osteoporotic 02/22/2014  . Essential hypertension, benign 02/20/2014  . Carotid stenosis, bilateral 09/15/2011    Subjective:  CC:   Chief Complaint  Patient presents with  . Acute Visit  . Laryngitis    scratchy throat.   . Cough    dry    HPI:   Deanna White is a 80 y.o. female who presents for Follow up on multiple chronic and acute conditions.  1) sore throat with cough,  Has been present for 2 or 3 days.  No fevers,  No trouble swallowing..  Not dyspneic no cough. sich contacts at [employer].   2) HTN: taking her medication as directed but BP remains high both here and at home.  3) Back pain: chronic, improved somewhat after PT for recent vertebral fracture in Feburary after fall down icy steps. Does not radiate down either leg but does make household chores difficult.    Past Medical History  Diagnosis Date  . Carotid artery occlusion   . Depression   . Hypertension   . Thyroid disease   . Menopause   . Allergy   . Heart murmur   . Varicose veins   . Sore throat   . Pneumonia   . Colon polyps   . Positive TB test   . Fall from slipping on ice Feb. 2015    Past Surgical History  Procedure Laterality Date  . Tonsillectomy    . Cesarean section    . Appendectomy    . Abdominal hysterectomy    . Bilateral salpingoophorectomy  1964       The following portions of the patient's history were reviewed and updated as appropriate: Allergies, current medications, and problem list.    Review of Systems:   Patient denies headache, fevers, malaise, unintentional weight loss, skin rash, eye pain, sinus congestion and sinus pain, sore throat, dysphagia,  hemoptysis , cough, dyspnea, wheezing, chest pain, palpitations, orthopnea,  edema, abdominal pain, nausea, melena, diarrhea, constipation, flank pain, dysuria, hematuria, urinary  Frequency, nocturia, numbness, tingling, seizures,  Focal weakness, Loss of consciousness,  Tremor, insomnia, depression, anxiety, and suicidal ideation.     History   Social History  . Marital Status: Widowed    Spouse Name: N/A    Number of Children: N/A  . Years of Education: N/A   Occupational History  . Not on file.   Social History Main Topics  . Smoking status: Never Smoker   . Smokeless tobacco: Never Used  . Alcohol Use: No  . Drug Use: No  . Sexual Activity: Not on file   Other Topics Concern  . Not on file   Social History Narrative    Objective:  Filed Vitals:   05/28/14 1041  BP: 152/60  Pulse: 60  Temp: 98.1 F (36.7 C)  Resp: 14     General appearance: alert, cooperative and appears stated age Ears: normal TM's and external ear canals both ears Throat: lips, mucosa, and tongue normal; teeth and gums normal Neck: no adenopathy, no carotid bruit, supple, symmetrical, trachea midline and thyroid not enlarged, symmetric, no tenderness/mass/nodules Back: symmetric, no curvature. ROM normal. No CVA tenderness. Lungs: clear to auscultation bilaterally Heart: regular rate and rhythm, S1, S2 normal, no murmur, click, rub or gallop Abdomen:  soft, non-tender; bowel sounds normal; no masses,  no organomegaly Pulses: 2+ and symmetric Skin: Skin color, texture, turgor normal. No rashes or lesions Lymph nodes: Cervical, supraclavicular, and axillary nodes normal.  Assessment and Plan:  Postmenopausal osteoporosis With prior vertebral fracture after a fall.  Records requested,  Will repeat her  DEXA scan and order Prolia for therapy      Essential hypertension, benign Elevated today.  Will increase losartan to 100 mg daily. Previously on telmisartan which was $$$   Acute viral pharyngitis Reassurance provided,  OTC meds discussed     Updated  Medication List Outpatient Encounter Prescriptions as of 05/28/2014  Medication Sig  . Ascorbic Acid (VITAMIN C PO) Take 500 mg by mouth daily.   Marland Kitchen aspirin EC 81 MG tablet Take 81 mg by mouth daily.    Marland Kitchen ketoconazole (NIZORAL) 2 % cream Apply 1 application topically daily. Feet  . Multiple Vitamins-Minerals ([employer]S SPECTRAVITE PO) Take 1 tablet by mouth daily.  . [DISCONTINUED] levothyroxine (SYNTHROID, LEVOTHROID) 50 MCG tablet Take 1 tablet (50 mcg total) by mouth daily.  . [DISCONTINUED] losartan (COZAAR) 100 MG tablet Take 1 tablet (100 mg total) by mouth daily.  . [DISCONTINUED] losartan (COZAAR) 50 MG tablet Take 1 tablet (50 mg total) by mouth daily.     Orders Placed This Encounter  Procedures  . DG Bone Density  . T4 AND TSH  . CBC with Differential    No Follow-up on file.

## 2014-05-29 LAB — T4 AND TSH
T4, Total: 5.4 ug/dL (ref 4.5–12.0)
TSH: 3.06 u[IU]/mL (ref 0.450–4.500)

## 2014-05-31 ENCOUNTER — Telehealth: Payer: Self-pay | Admitting: Internal Medicine

## 2014-05-31 ENCOUNTER — Encounter: Payer: Self-pay | Admitting: Internal Medicine

## 2014-05-31 DIAGNOSIS — B9789 Other viral agents as the cause of diseases classified elsewhere: Secondary | ICD-10-CM

## 2014-05-31 DIAGNOSIS — J028 Acute pharyngitis due to other specified organisms: Secondary | ICD-10-CM

## 2014-05-31 DIAGNOSIS — J029 Acute pharyngitis, unspecified: Secondary | ICD-10-CM | POA: Insufficient documentation

## 2014-05-31 NOTE — Assessment & Plan Note (Signed)
Elevated today.  Will increase losartan to 100 mg daily. Previously on telmisartan which was $$$

## 2014-05-31 NOTE — Assessment & Plan Note (Signed)
Reassurance provided,  OTC meds discussed

## 2014-05-31 NOTE — Telephone Encounter (Signed)
I have ordered a DEXA scan for this patient.  Please order.  thanks

## 2014-06-01 ENCOUNTER — Encounter: Payer: Self-pay | Admitting: *Deleted

## 2014-07-23 ENCOUNTER — Ambulatory Visit: Payer: Self-pay | Admitting: Internal Medicine

## 2014-07-25 LAB — HM DEXA SCAN

## 2014-07-26 ENCOUNTER — Telehealth: Payer: Self-pay | Admitting: Internal Medicine

## 2014-07-26 DIAGNOSIS — M810 Age-related osteoporosis without current pathological fracture: Secondary | ICD-10-CM

## 2014-07-26 NOTE — Telephone Encounter (Signed)
Patient has had recent DEXA at [facility] on Oct 22 .  Her T scores -2.4,  Prior vertebral fractures,  Requesting Prolia authorization .

## 2014-07-27 ENCOUNTER — Telehealth: Payer: Self-pay | Admitting: Internal Medicine

## 2014-07-27 NOTE — Telephone Encounter (Signed)
I have electronically sent pt's info for [facility] verification and will notify you as soon as I have a response. Thank you.

## 2014-07-27 NOTE — Telephone Encounter (Signed)
Patient notified as requested. 

## 2014-08-10 ENCOUNTER — Other Ambulatory Visit: Payer: Self-pay | Admitting: Internal Medicine

## 2014-08-10 NOTE — Telephone Encounter (Signed)
I have rec'd pt's Prolia insurance verification. Whether an OV is billed or not, pt will have an estimated responsibility of a $10 co-pay for the admin and a $50 co-pay for the Prolia, which means pt will have an estimated total responsibility of $60 co-pay. Please make pt aware this in an estimate and we won't know an exact amt until her insurance has paid. I have sent a copy of the summary of benefits to be scanned into pt's chart. If you have any questions, please let me know. Thank you.

## 2014-08-11 NOTE — Telephone Encounter (Signed)
Notified patient of cost of Prolia and patient prefers to wait til after January to decide as to proceed with Prolia patient is to call office with decision FYI.

## 2014-08-24 ENCOUNTER — Encounter: Payer: Self-pay | Admitting: Internal Medicine

## 2014-10-05 ENCOUNTER — Other Ambulatory Visit: Payer: Self-pay | Admitting: Internal Medicine

## 2014-10-08 ENCOUNTER — Encounter: Payer: Self-pay | Admitting: Family

## 2014-10-09 ENCOUNTER — Encounter: Payer: Self-pay | Admitting: Family

## 2014-10-09 ENCOUNTER — Other Ambulatory Visit: Payer: Self-pay | Admitting: Vascular Surgery

## 2014-10-09 ENCOUNTER — Other Ambulatory Visit: Payer: Self-pay | Admitting: Internal Medicine

## 2014-10-09 ENCOUNTER — Ambulatory Visit (INDEPENDENT_AMBULATORY_CARE_PROVIDER_SITE_OTHER): Payer: 59 | Admitting: Family

## 2014-10-09 ENCOUNTER — Ambulatory Visit (HOSPITAL_COMMUNITY)
Admission: RE | Admit: 2014-10-09 | Discharge: 2014-10-09 | Disposition: A | Discharge status: Home / Self Care | Payer: Medicare Other | Source: Ambulatory Visit | Attending: Family | Admitting: Family

## 2014-10-09 VITALS — BP 172/72 | HR 70 | Resp 16 | Ht 62.5 in | Wt 117.0 lb

## 2014-10-09 DIAGNOSIS — I6523 Occlusion and stenosis of bilateral carotid arteries: Secondary | ICD-10-CM | POA: Insufficient documentation

## 2014-10-09 DIAGNOSIS — R0989 Other specified symptoms and signs involving the circulatory and respiratory systems: Secondary | ICD-10-CM

## 2014-10-09 NOTE — Progress Notes (Addendum)
Established Carotid Patient   History of Present Illness  Deanna White is a 81 y.o. female patient of Dr. Bridgett Larsson who presents with chief complaint: left ear tinnitus. She returns for 2 year follow up of history of minimal carotid artery stenosis.  This is the patient's first evaluation by me.  Previous carotid studies demonstrated: RICA <02% stenosis, LICA <63% stenosis. Patient has no history of TIA or stroke symptom. The patient has never had amaurosis fugax or monocular blindness. The patient has never had facial drooping or hemiplegia. The patient has never had receptive or expressive aphasia.  The patient denies tingling, numbness, pain, or weakness in either upper extremity. She is a vegetarian, does her own yard work, grows her own food as much as possible.  Patient has not had previous carotid artery intervention.  Pt denies claudication symptoms in legs with walking, denies non healing wounds.  The patient denies New Medical or Surgical History.  Pt Diabetic: No Pt smoker: non-smoker  Pt meds include: Statin : No, states her cholesterol is good ASA: Yes Other anticoagulants/antiplatelets: no   Past Medical History  Diagnosis Date  . Carotid artery occlusion   . Depression   . Hypertension   . Thyroid disease   . Menopause   . Allergy   . Heart murmur   . Varicose veins   . Sore throat   . Pneumonia   . Colon polyps   . Positive TB test   . Fall from slipping on ice Feb. 2015    Social History History  Substance Use Topics  . Smoking status: Never Smoker   . Smokeless tobacco: Never Used  . Alcohol Use: No    Family History Family History  Problem Relation Age of Onset  . Heart disease Mother     After age 46  . Hypertension Mother   . Deep vein thrombosis Mother   . Heart attack Mother   . Kidney disease Father   . Alcohol abuse Brother   . Heart disease Brother   . Other Other     epilepsy  . Hypertension Other   . Cancer Paternal  Grandmother     breast cancer  . Cancer Brother     Lung cancer  . Cancer Brother     kidney cancer  . Varicose Veins Sister   . Heart disease Sister     After age 43  . Heart attack Sister   . Heart disease Sister     After age 82    Surgical History Past Surgical History  Procedure Laterality Date  . Tonsillectomy    . Cesarean section    . Appendectomy    . Abdominal hysterectomy    . Bilateral salpingoophorectomy  1964    Allergies  Allergen Reactions  . Adhesive [Tape] Rash    Peels skin    Current Outpatient Prescriptions  Medication Sig Dispense Refill  . Ascorbic Acid (VITAMIN C PO) Take 500 mg by mouth daily.     Marland Kitchen aspirin EC 81 MG tablet Take 81 mg by mouth daily.      Marland Kitchen ketoconazole (NIZORAL) 2 % cream Apply 1 application topically daily. Feet    . levothyroxine (SYNTHROID, LEVOTHROID) 50 MCG tablet TAKE 1 TABLET BY MOUTH EVERY DAY 90 tablet 1  . levothyroxine (SYNTHROID, LEVOTHROID) 50 MCG tablet TAKE 1 TABLET BY MOUTH EVERY DAY 90 tablet 1  . losartan (COZAAR) 100 MG tablet TAKE 1 TABLET BY MOUTH ONCE A DAY 30 tablet  5  . Multiple Vitamins-Minerals (CVS SPECTRAVITE PO) Take 1 tablet by mouth daily.     No current facility-administered medications for this visit.    Review of Systems : See HPI for pertinent positives and negatives.  Physical Examination  Filed Vitals:   10/09/14 1122 10/09/14 1128  BP: 157/74 172/72  Pulse: 70 70  Resp:  16  Height:  5' 2.5" (1.588 m)  Weight:  117 lb (53.071 kg)  SpO2:  98%   Body mass index is 21.05 kg/(m^2).  General: A&O x 3, WD , elderly  Eyes: PERRLA, EOMI  Pulmonary: Sym exp, good air movt, CTAB, no rales, rhonchi, & wheezing  Cardiac: RRR, Nl S1, S2, + murmur  Vascular: Vessel Right Left  Radial Palpable Palpable  Carotid Palpable, with bruit Palpable, with bruit  Aorta Not palpable N/A  Femoral Palpable Palpable  Popliteal Not palpable Not palpable  PT Not Palpable Not  Palpable  DP Not Palpable Not Palpable   Gastrointestinal: soft, NTND, -G/R, - HSM, - palpable masses, - CVAT B  Musculoskeletal: M/S 5/5 throughout , Extremities without ischemic changes   Neurologic: CN 2-12 intact , Pain and light touch intact in extremities , Motor exam as listed above    Non-Invasive Vascular Imaging CAROTID DUPLEX 10/09/2014   CEREBROVASCULAR DUPLEX EVALUATION    INDICATION: Carotid disease    PREVIOUS INTERVENTION(S):     DUPLEX EXAM:     RIGHT  LEFT  Peak Systolic Velocities (cm/s) End Diastolic Velocities (cm/s) Plaque LOCATION Peak Systolic Velocities (cm/s) End Diastolic Velocities (cm/s) Plaque  83 11  CCA PROXIMAL 73 13   74 11  CCA MID 84 14   73 13  CCA DISTAL 86 16 HT  120 11  ECA 98 8   68 18 HT ICA PROXIMAL 62 17 HT  143 30  ICA MID 94 21   79 18  ICA DISTAL 138 27     1.96 ICA / CCA Ratio (PSV) 1.6  Antegrade Vertebral Flow Antegrade  741 Brachial Systolic Pressure (mmHg) 638  Multiphasic (subclavian artery) Brachial Artery Waveforms Multiphasic (subclavian artery)    Plaque Morphology:  HM = Homogeneous, HT = Heterogeneous, CP = Calcific Plaque, SP = Smooth Plaque, IP = Irregular Plaque     ADDITIONAL FINDINGS: . No significant stenosis of the bilateral external or common carotid arteries. . Tortuous bilateral mid to distal internal carotid arteries noted.    IMPRESSION: Doppler velocities suggest less than 40% stenoses of the bilateral proximal internal carotid arteries.    Compared to the previous exam:  No significant change noted when compared to the previous exam on 09/20/12.         Assessment: Deanna White is a 81 y.o. female who presents with asymptomatic minimal bilateral ICA stenoses; no significant change noted when compared to the previous exam on 09/20/12. However, multiphasic bilateral brachial artery waveforms are a new finding. She denies dizziness, blood pressure systolic gradient is not significant at this  time. Greater than 50% of the 30 minutes face to face time with pt was spent on counseling and coordination of care.  Plan: Follow-up in 1 year with Carotid Duplex.   I discussed in depth with the patient the nature of atherosclerosis, and emphasized the importance of maximal medical management including strict control of blood pressure, blood glucose, and lipid levels, obtaining regular exercise, and continued cessation of smoking.  The patient is aware that without maximal medical management the underlying atherosclerotic disease process  will progress, limiting the benefit of any interventions. The patient was given information about stroke prevention and what symptoms should prompt the patient to seek immediate medical care. Thank you for allowing [place] to participate in this patient's care.  Clemon Chambers, RN, MSN, FNP-C [facility] Office: [phone]  Clinic Physician: Bridgett Larsson  10/09/2014 11:41 AM

## 2014-10-09 NOTE — Addendum Note (Signed)
Addended by: Mena Goes on: 10/09/2014 02:38 PM   Modules accepted: Orders

## 2014-10-09 NOTE — Patient Instructions (Signed)
Stroke Prevention Some medical conditions and behaviors are associated with an increased chance of having a stroke. You may prevent a stroke by making healthy choices and managing medical conditions. HOW CAN I REDUCE MY RISK OF HAVING A STROKE?   Stay physically active. Get at least 30 minutes of activity on most or all days.  Do not smoke. It may also be helpful to avoid exposure to secondhand smoke.  Limit alcohol use. Moderate alcohol use is considered to be:  No more than 2 drinks per day for men.  No more than 1 drink per day for nonpregnant women.  Eat healthy foods. This involves:  Eating 5 or more servings of fruits and vegetables a day.  Making dietary changes that address high blood pressure (hypertension), high cholesterol, diabetes, or obesity.  Manage your cholesterol levels.  Making food choices that are high in fiber and low in saturated fat, trans fat, and cholesterol may control cholesterol levels.  Take any prescribed medicines to control cholesterol as directed by your health care provider.  Manage your diabetes.  Controlling your carbohydrate and sugar intake is recommended to manage diabetes.  Take any prescribed medicines to control diabetes as directed by your health care provider.  Control your hypertension.  Making food choices that are low in salt (sodium), saturated fat, trans fat, and cholesterol is recommended to manage hypertension.  Take any prescribed medicines to control hypertension as directed by your health care provider.  Maintain a healthy weight.  Reducing calorie intake and making food choices that are low in sodium, saturated fat, trans fat, and cholesterol are recommended to manage weight.  Stop drug abuse.  Avoid taking birth control pills.  Talk to your health care provider about the risks of taking birth control pills if you are over 35 years old, smoke, get migraines, or have ever had a blood clot.  Get evaluated for sleep  disorders (sleep apnea).  Talk to your health care provider about getting a sleep evaluation if you snore a lot or have excessive sleepiness.  Take medicines only as directed by your health care provider.  For some people, aspirin or blood thinners (anticoagulants) are helpful in reducing the risk of forming abnormal blood clots that can lead to stroke. If you have the irregular heart rhythm of atrial fibrillation, you should be on a blood thinner unless there is a good reason you cannot take them.  Understand all your medicine instructions.  Make sure that other conditions (such as anemia or atherosclerosis) are addressed. SEEK IMMEDIATE MEDICAL CARE IF:   You have sudden weakness or numbness of the face, arm, or leg, especially on one side of the body.  Your face or eyelid droops to one side.  You have sudden confusion.  You have trouble speaking (aphasia) or understanding.  You have sudden trouble seeing in one or both eyes.  You have sudden trouble walking.  You have dizziness.  You have a loss of balance or coordination.  You have a sudden, severe headache with no known cause.  You have new chest pain or an irregular heartbeat. Any of these symptoms may represent a serious problem that is an emergency. Do not wait to see if the symptoms will go away. Get medical help at once. Call your local emergency services (911 in [place] Do not drive yourself to the hospital. Document Released: 10/26/2004 Document Revised: 02/02/2014 Document Reviewed: 03/21/2013 ExitCare Patient Information 2015 [employer]. This information is not intended to replace advice given   to you by your health care provider. Make sure you discuss any questions you have with your health care provider.  

## 2014-10-27 ENCOUNTER — Telehealth: Payer: Self-pay | Admitting: Internal Medicine

## 2014-11-03 ENCOUNTER — Encounter: Payer: Self-pay | Admitting: Internal Medicine

## 2014-12-01 HISTORY — PX: EYE SURGERY: SHX253

## 2015-01-20 DIAGNOSIS — H59022 Cataract (lens) fragments in eye following cataract surgery, left eye: Secondary | ICD-10-CM | POA: Insufficient documentation

## 2015-02-22 ENCOUNTER — Ambulatory Visit (INDEPENDENT_AMBULATORY_CARE_PROVIDER_SITE_OTHER): Payer: Medicare Other | Admitting: Internal Medicine

## 2015-02-22 ENCOUNTER — Encounter: Payer: Self-pay | Admitting: Internal Medicine

## 2015-02-22 VITALS — BP 154/60 | HR 70 | Temp 98.0°F | Resp 14 | Ht 63.0 in | Wt 117.8 lb

## 2015-02-22 DIAGNOSIS — I10 Essential (primary) hypertension: Secondary | ICD-10-CM | POA: Diagnosis not present

## 2015-02-22 DIAGNOSIS — E034 Atrophy of thyroid (acquired): Secondary | ICD-10-CM | POA: Diagnosis not present

## 2015-02-22 DIAGNOSIS — M810 Age-related osteoporosis without current pathological fracture: Secondary | ICD-10-CM

## 2015-02-22 DIAGNOSIS — D692 Other nonthrombocytopenic purpura: Secondary | ICD-10-CM

## 2015-02-22 DIAGNOSIS — R5382 Chronic fatigue, unspecified: Secondary | ICD-10-CM | POA: Diagnosis not present

## 2015-02-22 DIAGNOSIS — E038 Other specified hypothyroidism: Secondary | ICD-10-CM

## 2015-02-22 DIAGNOSIS — R636 Underweight: Secondary | ICD-10-CM

## 2015-02-22 DIAGNOSIS — E559 Vitamin D deficiency, unspecified: Secondary | ICD-10-CM

## 2015-02-22 DIAGNOSIS — Z23 Encounter for immunization: Secondary | ICD-10-CM

## 2015-02-22 LAB — CBC WITH DIFFERENTIAL/PLATELET
Basophils Absolute: 0 10*3/uL (ref 0.0–0.1)
Basophils Relative: 0.2 % (ref 0.0–3.0)
EOS ABS: 0.2 10*3/uL (ref 0.0–0.7)
Eosinophils Relative: 3.1 % (ref 0.0–5.0)
HCT: 31.4 % — ABNORMAL LOW (ref 36.0–46.0)
Hemoglobin: 10.4 g/dL — ABNORMAL LOW (ref 12.0–15.0)
LYMPHS ABS: 1.5 10*3/uL (ref 0.7–4.0)
Lymphocytes Relative: 29.3 % (ref 12.0–46.0)
MCHC: 33.3 g/dL (ref 30.0–36.0)
MCV: 97.4 fl (ref 78.0–100.0)
Monocytes Absolute: 0.6 10*3/uL (ref 0.1–1.0)
Monocytes Relative: 10.8 % (ref 3.0–12.0)
Neutro Abs: 3 10*3/uL (ref 1.4–7.7)
Neutrophils Relative %: 56.6 % (ref 43.0–77.0)
Platelets: 142 10*3/uL — ABNORMAL LOW (ref 150.0–400.0)
RBC: 3.22 Mil/uL — ABNORMAL LOW (ref 3.87–5.11)
RDW: 13.8 % (ref 11.5–15.5)
WBC: 5.3 10*3/uL (ref 4.0–10.5)

## 2015-02-22 LAB — COMPREHENSIVE METABOLIC PANEL
ALT: 13 U/L (ref 0–35)
AST: 21 U/L (ref 0–37)
Albumin: 4.1 g/dL (ref 3.5–5.2)
Alkaline Phosphatase: 54 U/L (ref 39–117)
BILIRUBIN TOTAL: 0.2 mg/dL (ref 0.2–1.2)
BUN: 19 mg/dL (ref 6–23)
CHLORIDE: 104 meq/L (ref 96–112)
CO2: 31 meq/L (ref 19–32)
CREATININE: 0.95 mg/dL (ref 0.40–1.20)
Calcium: 9.3 mg/dL (ref 8.4–10.5)
GFR: 59.08 mL/min — ABNORMAL LOW (ref >60.00)
GLUCOSE: 83 mg/dL (ref 70–99)
Potassium: 4.8 mEq/L (ref 3.5–5.1)
SODIUM: 138 meq/L (ref 135–145)
TOTAL PROTEIN: 5.9 g/dL — AB (ref 6.0–8.3)

## 2015-02-22 NOTE — Patient Instructions (Addendum)
There is no evidence of a tick bite or other insect bite on your left eyelid  You may have scratched yourself , and the redness is due to bruising   You can use benadryl for the itching, an 1% hydrocortisone cream if needed     You are considered underweight:  Please add more protein to your daily regimen :  Try Ensure,   Boost,  Or Premier Protein  As a daily supplement

## 2015-02-22 NOTE — Progress Notes (Signed)
Pre-visit discussion using our clinic review tool. No additional management support is needed unless otherwise documented below in the visit note.  

## 2015-02-22 NOTE — Progress Notes (Signed)
Subjective:  [name], female    DOB: [birth year]  Age: 81 y.o. MRN: [id number]  CC: The primary encounter diagnosis was Vitamin D deficiency. Diagnoses of Hypothyroidism due to acquired atrophy of thyroid, Accelerated hypertension, Chronic fatigue, Need for prophylactic vaccination against Streptococcus pneumoniae (pneumococcus), Patient underweight, Postmenopausal osteoporosis, and Purpura were also pertinent to this visit.  HPI LATIFFANY HARWICK presents for follow up on chronic conditions including hypothyroidism. She also reports   sudden onset of left inner eyelid redness which started after working out in the yard this morning pickup up limbs,  picking up compost.  She reports pruritus but no history of insect bite,  Although she has fond nonengorged tiks in the rectn past on her legs.   Some joint pain aggravted by yard work but not enough to take medications or limit her activity  No history of falls in the past year. Appetite good,   Outpatient Prescriptions Prior to Visit  Medication Sig Dispense Refill  . aspirin EC 81 MG tablet Take 81 mg by mouth daily.      Marland Kitchen ketoconazole (NIZORAL) 2 % cream Apply 1 application topically daily. Feet    . levothyroxine (SYNTHROID, LEVOTHROID) 50 MCG tablet TAKE 1 TABLET BY MOUTH EVERY DAY 90 tablet 1  . losartan (COZAAR) 100 MG tablet TAKE 1 TABLET BY MOUTH ONCE A DAY 30 tablet 5  . Ascorbic Acid (VITAMIN C PO) Take 500 mg by mouth daily.     Marland Kitchen levothyroxine (SYNTHROID, LEVOTHROID) 50 MCG tablet TAKE 1 TABLET BY MOUTH EVERY DAY 90 tablet 1  . Multiple Vitamins-Minerals (CVS SPECTRAVITE PO) Take 1 tablet by mouth daily.     No facility-administered medications prior to visit.    Review of Systems;  Patient denies headache, fevers, malaise, unintentional weight loss, skin rash, eye pain, sinus congestion and sinus pain, sore throat, dysphagia,  hemoptysis , cough, dyspnea, wheezing, chest pain, palpitations, orthopnea, edema,  abdominal pain, nausea, melena, diarrhea, constipation, flank pain, dysuria, hematuria, urinary  Frequency, nocturia, numbness, tingling, seizures,  Focal weakness, Loss of consciousness,  Tremor, insomnia, depression, anxiety, and suicidal ideation.      Objective:  BP 154/60 mmHg  Pulse 70  Temp(Src) 98 F (36.7 C) (Oral)  Resp 14  Ht 5\' 3"  (1.6 m)  Wt 117 lb 12 oz (53.411 kg)  BMI 20.86 kg/m2  SpO2 98%  BP Readings from Last 3 Encounters:  02/22/15 154/60  10/09/14 172/72  05/28/14 152/60    Wt Readings from Last 3 Encounters:  02/22/15 117 lb 12 oz (53.411 kg)  10/09/14 117 lb (53.071 kg)  05/28/14 118 lb 8 oz (53.751 kg)    General appearance: alert, cooperative and appears stated age Ears: normal TM's and external ear canals both ears Eyes: clear noninjected,  Left upper eyelid inner canthus with small ecchymosis surrounded by bruising withut edema or warmth. So gins of insect bite. Throat: lips, mucosa, and tongue normal; teeth and gums normal Neck: no adenopathy, no carotid bruit, supple, symmetrical, trachea midline and thyroid not enlarged, symmetric, no tenderness/mass/nodules Back: symmetric, no curvature. ROM normal. No CVA tenderness. Lungs: clear to auscultation bilaterally Heart: regular rate and rhythm, S1, S2 normal, no murmur, click, rub or gallop Abdomen: soft, non-tender; bowel sounds normal; no masses,  no organomegaly Pulses: 2+ and symmetric Skin: Skin color, texture, turgor normal. No rashes or lesions Lymph nodes: Cervical, supraclavicular, and axillary nodes normal.  No results found for: HGBA1C  Lab Results  Component Value Date   CREATININE 1.0 04/06/2014   CREATININE 0.9 02/20/2014    Lab Results  Component Value Date   WBC 7.7 05/28/2014   HGB 11.7* 05/28/2014   HCT 35.2* 05/28/2014   PLT 158.0 05/28/2014   GLUCOSE 97 04/06/2014   CHOL 189 04/06/2014   TRIG 99.0 04/06/2014   HDL 69.00 04/06/2014   LDLCALC 100* 04/06/2014   ALT  16 04/06/2014   AST 23 04/06/2014   NA 140 04/06/2014   K 4.9 04/06/2014   CL 104 04/06/2014   CREATININE 1.0 04/06/2014   BUN 16 04/06/2014   CO2 28 04/06/2014   TSH 3.060 05/28/2014    No results found.  Assessment & Plan:   Problem List Items Addressed This Visit    Postmenopausal osteoporosis    With prior vertebral fracture after a fall. Repeat  DEXA scan Oct 2015 T score -2.4 will order Prolia for therapy           Relevant Medications   denosumab (PROLIA) 60 MG/ML SOLN injection   Patient underweight     I have reviewed her diet and recommended that she increase her protein and fat intake while monitoring her carbohydrates.       Hypothyroidism    Thyroid function is WNL on current dose.  No current changes, repeat tsh is  needed.   Lab Results  Component Value Date   TSH 3.060 05/28/2014         Relevant Orders   TSH   Purpura    Left upper eyelid, inner canthus,  Likely secondary to trauma from scratching,   No tick or insect bite seem,  No evidence of cellulitis. Checking CBC for platelet count.  Patient advised to call for doxycycline rx if she develops fever and headache        Other Visit Diagnoses    Vitamin D deficiency    -  Primary    Relevant Orders    Vit D  25 hydroxy (rtn osteoporosis monitoring)    Accelerated hypertension        Relevant Orders    Comprehensive metabolic panel    Chronic fatigue        Relevant Orders    CBC with Differential/Platelet    Need for prophylactic vaccination against Streptococcus pneumoniae (pneumococcus)        Relevant Orders    Pneumococcal polysaccharide vaccine 23-valent greater than or equal to 2yo subcutaneous/IM (Completed)       I have discontinued Ms. Moreland's Multiple Vitamins-Minerals (CVS SPECTRAVITE PO). I am also having her start on denosumab. Additionally, I am having her maintain her aspirin EC, Ascorbic Acid (VITAMIN C PO), ketoconazole, levothyroxine, losartan, PRESERVISION AREDS 2,  and Polyethyl Glycol-Propyl Glycol.  Meds ordered this encounter  Medications  . Multiple Vitamins-Minerals (PRESERVISION AREDS 2) CAPS    Sig: Take 2 capsules by mouth 2 (two) times daily.  Vladimir Faster Glycol-Propyl Glycol 0.4-0.3 % SOLN    Sig: Apply 1 drop to eye 3 (three) times daily as needed.  Marland Kitchen denosumab (PROLIA) 60 MG/ML SOLN injection    Sig: Inject 60 mg into the skin every 6 (six) months. Administer in upper arm, thigh, or abdomen    Dispense:  180 mL    Refill:  1    Medications Discontinued During This Encounter  Medication Reason  . levothyroxine (SYNTHROID, LEVOTHROID) 50 MCG tablet Duplicate  . Multiple Vitamins-Minerals (CVS SPECTRAVITE PO) Change in therapy    Follow-up: No Follow-up  on file.   Crecencio Mc, MD

## 2015-02-23 ENCOUNTER — Telehealth: Payer: Self-pay | Admitting: Internal Medicine

## 2015-02-23 DIAGNOSIS — R636 Underweight: Secondary | ICD-10-CM | POA: Insufficient documentation

## 2015-02-23 DIAGNOSIS — D692 Other nonthrombocytopenic purpura: Secondary | ICD-10-CM | POA: Insufficient documentation

## 2015-02-23 DIAGNOSIS — E039 Hypothyroidism, unspecified: Secondary | ICD-10-CM | POA: Insufficient documentation

## 2015-02-23 LAB — VITAMIN D 25 HYDROXY (VIT D DEFICIENCY, FRACTURES): VITD: 39.88 ng/mL (ref 30.00–100.00)

## 2015-02-23 LAB — TSH: TSH: 2.2 u[IU]/mL (ref 0.35–4.50)

## 2015-02-23 MED ORDER — DENOSUMAB 60 MG/ML ~~LOC~~ SOLN: 60.0000 mg | SUBCUTANEOUS | Status: DC

## 2015-02-23 NOTE — Telephone Encounter (Signed)
Need a PA started for patient for Prolia.

## 2015-02-23 NOTE — Assessment & Plan Note (Addendum)
With prior vertebral fracture after a fall. Repeat  DEXA scan Oct 2015 T score -2.4 will order Prolia for therapy

## 2015-02-23 NOTE — Assessment & Plan Note (Signed)
Thyroid function is WNL on current dose.  No current changes, repeat tsh is  needed.   Lab Results  Component Value Date   TSH 3.060 05/28/2014

## 2015-02-23 NOTE — Assessment & Plan Note (Signed)
I have reviewed her diet and recommended that she increase her protein and fat intake while monitoring her carbohydrates.  

## 2015-02-23 NOTE — Assessment & Plan Note (Addendum)
Left upper eyelid, inner canthus,  Likely secondary to trauma from scratching,   No tick or insect bite seem,  No evidence of cellulitis. Checking CBC for platelet count.  Patient advised to call for doxycycline rx if she develops fever and headache

## 2015-02-24 NOTE — Telephone Encounter (Signed)
FYI

## 2015-02-24 NOTE — Telephone Encounter (Signed)
I have electronically sent pt's info for Prolia insurance verification and will notify you once I have a response. Thank you. °

## 2015-02-25 ENCOUNTER — Other Ambulatory Visit: Payer: Self-pay | Admitting: Internal Medicine

## 2015-02-25 DIAGNOSIS — D649 Anemia, unspecified: Secondary | ICD-10-CM

## 2015-02-25 DIAGNOSIS — D519 Vitamin B12 deficiency anemia, unspecified: Secondary | ICD-10-CM | POA: Insufficient documentation

## 2015-02-25 HISTORY — DX: Anemia, unspecified: D64.9

## 2015-02-26 ENCOUNTER — Telehealth: Payer: Self-pay | Admitting: Internal Medicine

## 2015-02-26 NOTE — Telephone Encounter (Signed)
Pt returning call about lab results [phone] 02/26/15 maf

## 2015-02-26 NOTE — Telephone Encounter (Signed)
Deanna White Please advise?

## 2015-02-26 NOTE — Telephone Encounter (Signed)
Patient advised of results.

## 2015-03-04 NOTE — Telephone Encounter (Signed)
Left message for patient to return call to office. 

## 2015-03-04 NOTE — Telephone Encounter (Signed)
I have rec'd pt's insurance verification for Prolia.  Whether an OV is billed or not pt will have an estimated responsibility for a $15 co-pay which covers the admin and OV (if billed) plus a $50 co-pay which covers Prolia for a total estimated pt responsibility of $65.  Please advise her this is an estimate and we will not know an exact amt until her insurance has paid.  I have sent a copy of the summary of benefits to be scanned into her chart. Please let me know if you have any questions. Thank you.

## 2015-03-04 NOTE — Telephone Encounter (Signed)
Left message for patient to call office.  

## 2015-03-08 ENCOUNTER — Telehealth: Payer: Self-pay

## 2015-03-08 NOTE — Telephone Encounter (Signed)
Pt notified, would like to think about cost and will call us back.

## 2015-03-08 NOTE — Telephone Encounter (Signed)
Pt notified of lab results, appt scheduled for 03/11/15

## 2015-03-08 NOTE — Telephone Encounter (Signed)
The pt called and is hoping to have her lab work results given to her.  Pt callback - [phone]

## 2015-03-11 ENCOUNTER — Other Ambulatory Visit (INDEPENDENT_AMBULATORY_CARE_PROVIDER_SITE_OTHER): Payer: Medicare Other

## 2015-03-11 DIAGNOSIS — D649 Anemia, unspecified: Secondary | ICD-10-CM | POA: Diagnosis not present

## 2015-03-11 DIAGNOSIS — Z79899 Other long term (current) drug therapy: Secondary | ICD-10-CM | POA: Diagnosis not present

## 2015-03-11 LAB — FERRITIN: Ferritin: 13.2 ng/mL (ref 10.0–291.0)

## 2015-03-11 LAB — VITAMIN B12: Vitamin B-12: 582 pg/mL (ref 211–911)

## 2015-03-12 LAB — IBC PANEL
IRON: 64 ug/dL (ref 42–145)
Saturation Ratios: 18.7 % — ABNORMAL LOW (ref 20.0–50.0)
Transferrin: 245 mg/dL (ref 212.0–360.0)

## 2015-03-12 LAB — FOLATE RBC: RBC FOLATE: 1556 ng/mL (ref >280)

## 2015-03-12 NOTE — Addendum Note (Signed)
Addended by: Karlene Einstein D on: 03/12/2015 11:16 AM   Modules accepted: Orders

## 2015-03-15 ENCOUNTER — Encounter: Payer: Self-pay | Admitting: *Deleted

## 2015-03-15 LAB — IRON AND TIBC

## 2015-03-17 ENCOUNTER — Other Ambulatory Visit (INDEPENDENT_AMBULATORY_CARE_PROVIDER_SITE_OTHER): Payer: Medicare Other

## 2015-03-17 DIAGNOSIS — D649 Anemia, unspecified: Secondary | ICD-10-CM

## 2015-03-17 LAB — FECAL OCCULT BLOOD, IMMUNOCHEMICAL: Fecal Occult Bld: NEGATIVE

## 2015-04-08 ENCOUNTER — Other Ambulatory Visit: Payer: Self-pay | Admitting: Internal Medicine

## 2015-04-25 ENCOUNTER — Other Ambulatory Visit: Payer: Self-pay | Admitting: Internal Medicine

## 2015-06-07 NOTE — Telephone Encounter (Signed)
Encounter closed

## 2015-07-15 ENCOUNTER — Other Ambulatory Visit: Payer: Self-pay

## 2015-07-15 MED ORDER — LOSARTAN POTASSIUM 100 MG PO TABS: 100.0000 mg | ORAL_TABLET | Freq: Every day | ORAL | Status: DC

## 2015-10-08 ENCOUNTER — Encounter: Payer: Self-pay | Admitting: Family

## 2015-10-14 ENCOUNTER — Other Ambulatory Visit: Payer: Self-pay | Admitting: Internal Medicine

## 2015-10-15 ENCOUNTER — Ambulatory Visit (HOSPITAL_COMMUNITY)
Admission: RE | Admit: 2015-10-15 | Discharge: 2015-10-15 | Disposition: A | Discharge status: Home / Self Care | Payer: Medicare Other | Source: Ambulatory Visit | Attending: Family | Admitting: Family

## 2015-10-15 ENCOUNTER — Ambulatory Visit (INDEPENDENT_AMBULATORY_CARE_PROVIDER_SITE_OTHER): Payer: Medicare Other | Admitting: Family

## 2015-10-15 ENCOUNTER — Encounter: Payer: Self-pay | Admitting: Family

## 2015-10-15 VITALS — BP 172/74 | HR 68 | Temp 97.6°F | Resp 16 | Ht 62.5 in | Wt 120.0 lb

## 2015-10-15 DIAGNOSIS — I6523 Occlusion and stenosis of bilateral carotid arteries: Secondary | ICD-10-CM | POA: Diagnosis present

## 2015-10-15 DIAGNOSIS — R0989 Other specified symptoms and signs involving the circulatory and respiratory systems: Secondary | ICD-10-CM

## 2015-10-15 DIAGNOSIS — IMO0001 Reserved for inherently not codable concepts without codable children: Secondary | ICD-10-CM

## 2015-10-15 DIAGNOSIS — R030 Elevated blood-pressure reading, without diagnosis of hypertension: Secondary | ICD-10-CM | POA: Diagnosis not present

## 2015-10-15 NOTE — Progress Notes (Signed)
Filed Vitals:   10/15/15 1126 10/15/15 1130 10/15/15 1137 10/15/15 1138  BP: 162/71 162/67 151/71 172/74  Pulse: 70 69 69 68  Temp:  97.6 F (36.4 C)    TempSrc:  Oral    Resp:  16    Height:  5' 2.5" (1.588 m)    Weight:  120 lb (54.432 kg)    SpO2:  100%

## 2015-10-15 NOTE — Patient Instructions (Signed)
Stroke Prevention Some medical conditions and behaviors are associated with an increased chance of having a stroke. You may prevent a stroke by making healthy choices and managing medical conditions. HOW CAN I REDUCE MY RISK OF HAVING A STROKE?   Stay physically active. Get at least 30 minutes of activity on most or all days.  Do not smoke. It may also be helpful to avoid exposure to secondhand smoke.  Limit alcohol use. Moderate alcohol use is considered to be:  No more than 2 drinks per day for men.  No more than 1 drink per day for nonpregnant women.  Eat healthy foods. This involves:  Eating 5 or more servings of fruits and vegetables a day.  Making dietary changes that address high blood pressure (hypertension), high cholesterol, diabetes, or obesity.  Manage your cholesterol levels.  Making food choices that are high in fiber and low in saturated fat, trans fat, and cholesterol may control cholesterol levels.  Take any prescribed medicines to control cholesterol as directed by your health care provider.  Manage your diabetes.  Controlling your carbohydrate and sugar intake is recommended to manage diabetes.  Take any prescribed medicines to control diabetes as directed by your health care provider.  Control your hypertension.  Making food choices that are low in salt (sodium), saturated fat, trans fat, and cholesterol is recommended to manage hypertension.  Ask your health care provider if you need treatment to lower your blood pressure. Take any prescribed medicines to control hypertension as directed by your health care provider.  If you are 18-39 years of age, have your blood pressure checked every 3-5 years. If you are 40 years of age or older, have your blood pressure checked every year.  Maintain a healthy weight.  Reducing calorie intake and making food choices that are low in sodium, saturated fat, trans fat, and cholesterol are recommended to manage  weight.  Stop drug abuse.  Avoid taking birth control pills.  Talk to your health care provider about the risks of taking birth control pills if you are over 35 years old, smoke, get migraines, or have ever had a blood clot.  Get evaluated for sleep disorders (sleep apnea).  Talk to your health care provider about getting a sleep evaluation if you snore a lot or have excessive sleepiness.  Take medicines only as directed by your health care provider.  For some people, aspirin or blood thinners (anticoagulants) are helpful in reducing the risk of forming abnormal blood clots that can lead to stroke. If you have the irregular heart rhythm of atrial fibrillation, you should be on a blood thinner unless there is a good reason you cannot take them.  Understand all your medicine instructions.  Make sure that other conditions (such as anemia or atherosclerosis) are addressed. SEEK IMMEDIATE MEDICAL CARE IF:   You have sudden weakness or numbness of the face, arm, or leg, especially on one side of the body.  Your face or eyelid droops to one side.  You have sudden confusion.  You have trouble speaking (aphasia) or understanding.  You have sudden trouble seeing in one or both eyes.  You have sudden trouble walking.  You have dizziness.  You have a loss of balance or coordination.  You have a sudden, severe headache with no known cause.  You have new chest pain or an irregular heartbeat. Any of these symptoms may represent a serious problem that is an emergency. Do not wait to see if the symptoms will   go away. Get medical help at once. Call your local emergency services (911 in [place] Do not drive yourself to the hospital.   This information is not intended to replace advice given to you by your health care provider. Make sure you discuss any questions you have with your health care provider.   Document Released: 10/26/2004 Document Revised: 10/09/2014 Document Reviewed:  03/21/2013 Elsevier Interactive Patient Education 2016 Elsevier Inc.  

## 2015-10-15 NOTE — Progress Notes (Signed)
Chief Complaint: Extracranial Carotid Artery Stenosis   History of Present Illness  Deanna White is a 82 y.o. female patient of Dr. Bridgett Larsson who presents with chief complaint:  follow up for history of minimal carotid artery stenosis.  Previous carotid studies demonstrated: RICA Q000111Q stenosis, LICA Q000111Q stenosis.   The patient denies any history of TIA or stroke symptoms. Specifically she denies a hx of amaurosis fugax or monocular blindness, unilateral facial drooping, hemiparesis, or receptive or expressive aphasia.   The patient denies tingling, numbness, pain, or weakness in either upper extremity, but both hands have always felt cold.  She is a vegetarian since about 2003, does her own yard work, grows her own food as much as possible.  Patient has not had previous carotid artery intervention.  Pt denies claudication symptoms in legs with walking, denies non healing wounds.  The patient reports New Medical or Surgical History: cataract extraction left eye with IOL.  Pt Diabetic: No Pt smoker: non-smoker  Pt meds include: Statin : No, states her cholesterol is good ASA: Yes Other anticoagulants/antiplatelets: no   Past Medical History  Diagnosis Date  . Carotid artery occlusion   . Depression   . Hypertension   . Thyroid disease   . Menopause   . Allergy   . Heart murmur   . Varicose veins   . Sore throat   . Pneumonia   . Colon polyps   . Positive TB test   . Fall from slipping on ice Feb. 2015    Social History Social History  Substance Use Topics  . Smoking status: Never Smoker   . Smokeless tobacco: Never Used  . Alcohol Use: No    Family History Family History  Problem Relation Age of Onset  . Heart disease Mother     After age 53  . Hypertension Mother   . Deep vein thrombosis Mother   . Heart attack Mother   . Kidney disease Father   . Alcohol abuse Brother   . Heart disease Brother   . Other Other     epilepsy  . Hypertension Other    . Cancer Paternal Grandmother     breast cancer  . Cancer Brother     Lung cancer  . Cancer Brother     kidney cancer  . Varicose Veins Sister   . Heart disease Sister     After age 24  . Heart attack Sister   . Heart disease Sister     After age 73    Surgical History Past Surgical History  Procedure Laterality Date  . Tonsillectomy    . Cesarean section    . Appendectomy    . Abdominal hysterectomy    . Bilateral salpingoophorectomy  1964  . Eye surgery Right March 2016    Cataract    Allergies  Allergen Reactions  . Brimonidine Other (See Comments)    Pt felt as if throat was closing up, pt also had rash  . Adhesive [Tape] Rash    Peels skin    Current Outpatient Prescriptions  Medication Sig Dispense Refill  . aspirin EC 81 MG tablet Take 81 mg by mouth daily.      Marland Kitchen ketoconazole (NIZORAL) 2 % cream Apply 1 application topically daily. Feet    . levothyroxine (SYNTHROID, LEVOTHROID) 50 MCG tablet TAKE 1 TABLET BY MOUTH EVERY DAY 90 tablet 1  . losartan (COZAAR) 100 MG tablet Take 1 tablet (100 mg total) by mouth daily. Morrison  tablet 3  . Multiple Vitamins-Minerals (PRESERVISION AREDS 2) CAPS Take 2 capsules by mouth 2 (two) times daily.    Vladimir Faster Glycol-Propyl Glycol 0.4-0.3 % SOLN Apply 1 drop to eye 3 (three) times daily as needed.    . Ascorbic Acid (VITAMIN C PO) Take 500 mg by mouth daily. Reported on 10/15/2015    . denosumab (PROLIA) 60 MG/ML SOLN injection Inject 60 mg into the skin every 6 (six) months. Administer in upper arm, thigh, or abdomen (Patient not taking: Reported on 10/15/2015) 180 mL 1  . levothyroxine (SYNTHROID, LEVOTHROID) 50 MCG tablet TAKE 1 TABLET BY MOUTH EVERY DAY (Patient not taking: Reported on 10/15/2015) 90 tablet 1   No current facility-administered medications for this visit.    Review of Systems : See HPI for pertinent positives and negatives.  Physical Examination  Filed Vitals:   10/15/15 1126 10/15/15 1130 10/15/15 1137  10/15/15 1138  BP: 162/71 162/67 151/71 172/74  Pulse: 70 69 69 68  Temp:  97.6 F (36.4 C)    TempSrc:  Oral    Resp:  16    Height:  5' 2.5" (1.588 m)    Weight:  120 lb (54.432 kg)    SpO2:  100%     Body mass index is 21.59 kg/(m^2).  General: A&O x 3, WD   Eyes: PERRLA  Pulmonary: Sym exp, good air movt, CTAB, no rales, rhonchi, or wheezing  Cardiac: RRR, Nl S1, S2, + murmur  Vascular: Vessel Right Left  Radial Palpable Palpable  Carotid Palpable, with bruit Palpable, with bruit  Aorta Not palpable N/A  Femoral Palpable Palpable  Popliteal Not palpable Not palpable  PT Not Palpable Not Palpable  DP  Palpable Palpable   Gastrointestinal: soft, NTND, -G/R, - HSM, - palpable masses, - CVAT B  Musculoskeletal: M/S 5/5 throughout , Extremities without ischemic changes   Neurologic: CN 2-12 intact , Pain and light touch intact in extremities , Motor exam as listed above           Non-Invasive Vascular Imaging CAROTID DUPLEX 10/15/2015   Right ICA: 1 - 39 % stenosis. Left ICA: 1 - 39 % stenosis. No significant change compared to exam on 10/09/14.     Assessment: Deanna White is a 82 y.o. female who has no hx of stroke or TIA. She appears younger and healthier than her stated age. Today's carotid duplex suggests minimal bilateral ICA stenoses. No significant change compared to exam on 10/09/14.   I advised pt to see her PCP as soon as possible re her elevated blood pressure.    Plan: Follow-up in 2 years with Carotid Duplex scan, at pt request, as she had been on a 2 year surveillance schedule.    I discussed in depth with the patient the nature of atherosclerosis, and emphasized the importance of maximal medical management including strict control of blood pressure, blood glucose, and lipid levels, obtaining regular exercise, and continued cessation of smoking.  The patient is aware that without maximal medical  management the underlying atherosclerotic disease process will progress, limiting the benefit of any interventions. The patient was given information about stroke prevention and what symptoms should prompt the patient to seek immediate medical care. Thank you for allowing [place] to participate in this patient's care.  Clemon Chambers, RN, MSN, FNP-C [facility] Office: [phone]  Clinic Physician: Scot Dock  10/15/2015 11:58 AM

## 2015-12-22 DIAGNOSIS — H40053 Ocular hypertension, bilateral: Secondary | ICD-10-CM | POA: Insufficient documentation

## 2015-12-22 DIAGNOSIS — H40013 Open angle with borderline findings, low risk, bilateral: Secondary | ICD-10-CM | POA: Insufficient documentation

## 2015-12-22 DIAGNOSIS — H469 Unspecified optic neuritis: Secondary | ICD-10-CM | POA: Insufficient documentation

## 2015-12-22 DIAGNOSIS — H35443 Age-related reticular degeneration of retina, bilateral: Secondary | ICD-10-CM | POA: Insufficient documentation

## 2015-12-22 DIAGNOSIS — H353190 Nonexudative age-related macular degeneration, unspecified eye, stage unspecified: Secondary | ICD-10-CM | POA: Insufficient documentation

## 2015-12-29 ENCOUNTER — Other Ambulatory Visit: Payer: Self-pay | Admitting: *Deleted

## 2015-12-29 MED ORDER — OSELTAMIVIR PHOSPHATE 75 MG PO CAPS: 75.0000 mg | ORAL_CAPSULE | Freq: Every day | ORAL | Status: DC

## 2015-12-29 NOTE — Telephone Encounter (Signed)
Sent!

## 2015-12-29 NOTE — Telephone Encounter (Signed)
Patient stated that she was exposed to the flu, her sister Jesusita Oka was diagnosed with the flu, at this office. Patient has requested tamiflu for preventative care. She currently has no symptoms.  [phone]

## 2015-12-29 NOTE — Telephone Encounter (Signed)
Please advise, thanks.

## 2016-03-01 ENCOUNTER — Ambulatory Visit (INDEPENDENT_AMBULATORY_CARE_PROVIDER_SITE_OTHER): Payer: Medicare Other | Admitting: Internal Medicine

## 2016-03-01 ENCOUNTER — Encounter: Payer: Self-pay | Admitting: Internal Medicine

## 2016-03-01 VITALS — BP 160/70 | HR 64 | Temp 98.3°F | Resp 12 | Ht 62.0 in | Wt 117.5 lb

## 2016-03-01 DIAGNOSIS — E034 Atrophy of thyroid (acquired): Secondary | ICD-10-CM | POA: Diagnosis not present

## 2016-03-01 DIAGNOSIS — Z0000 Encounter for general adult medical examination without abnormal findings: Secondary | ICD-10-CM | POA: Diagnosis not present

## 2016-03-01 DIAGNOSIS — E038 Other specified hypothyroidism: Secondary | ICD-10-CM | POA: Diagnosis not present

## 2016-03-01 DIAGNOSIS — D508 Other iron deficiency anemias: Secondary | ICD-10-CM | POA: Diagnosis not present

## 2016-03-01 DIAGNOSIS — E785 Hyperlipidemia, unspecified: Secondary | ICD-10-CM

## 2016-03-01 DIAGNOSIS — R011 Cardiac murmur, unspecified: Secondary | ICD-10-CM | POA: Diagnosis not present

## 2016-03-01 DIAGNOSIS — I10 Essential (primary) hypertension: Secondary | ICD-10-CM

## 2016-03-01 DIAGNOSIS — E559 Vitamin D deficiency, unspecified: Secondary | ICD-10-CM | POA: Diagnosis not present

## 2016-03-01 DIAGNOSIS — R55 Syncope and collapse: Secondary | ICD-10-CM

## 2016-03-01 LAB — CBC WITH DIFFERENTIAL/PLATELET
BASOS PCT: 0.2 % (ref 0.0–3.0)
Basophils Absolute: 0 10*3/uL (ref 0.0–0.1)
EOS PCT: 3 % (ref 0.0–5.0)
Eosinophils Absolute: 0.2 10*3/uL (ref 0.0–0.7)
HEMATOCRIT: 33 % — AB (ref 36.0–46.0)
Hemoglobin: 11.1 g/dL — ABNORMAL LOW (ref 12.0–15.0)
LYMPHS ABS: 1.6 10*3/uL (ref 0.7–4.0)
LYMPHS PCT: 31.8 % (ref 12.0–46.0)
MCHC: 33.5 g/dL (ref 30.0–36.0)
MCV: 97 fl (ref 78.0–100.0)
MONOS PCT: 12.3 % — AB (ref 3.0–12.0)
Monocytes Absolute: 0.6 10*3/uL (ref 0.1–1.0)
NEUTROS ABS: 2.6 10*3/uL (ref 1.4–7.7)
Neutrophils Relative %: 52.7 % (ref 43.0–77.0)
PLATELETS: 141 10*3/uL — AB (ref 150.0–400.0)
RBC: 3.41 Mil/uL — ABNORMAL LOW (ref 3.87–5.11)
RDW: 14.1 % (ref 11.5–15.5)
WBC: 5 10*3/uL (ref 4.0–10.5)

## 2016-03-01 LAB — COMPREHENSIVE METABOLIC PANEL
ALT: 14 U/L (ref 0–35)
AST: 23 U/L (ref 0–37)
Albumin: 4.1 g/dL (ref 3.5–5.2)
Alkaline Phosphatase: 50 U/L (ref 39–117)
BUN: 19 mg/dL (ref 6–23)
CHLORIDE: 106 meq/L (ref 96–112)
CO2: 28 meq/L (ref 19–32)
Calcium: 9.4 mg/dL (ref 8.4–10.5)
Creatinine, Ser: 1.07 mg/dL (ref 0.40–1.20)
GFR: 51.38 mL/min — AB (ref >60.00)
GLUCOSE: 92 mg/dL (ref 70–99)
POTASSIUM: 4.5 meq/L (ref 3.5–5.1)
SODIUM: 138 meq/L (ref 135–145)
TOTAL PROTEIN: 6 g/dL (ref 6.0–8.3)
Total Bilirubin: 0.3 mg/dL (ref 0.2–1.2)

## 2016-03-01 LAB — LIPID PANEL
CHOL/HDL RATIO: 3
Cholesterol: 178 mg/dL (ref 0–200)
HDL: 64.2 mg/dL (ref >39.00)
LDL Cholesterol: 100 mg/dL — ABNORMAL HIGH (ref 0–99)
NONHDL: 114.14
TRIGLYCERIDES: 73 mg/dL (ref 0.0–149.0)
VLDL: 14.6 mg/dL (ref 0.0–40.0)

## 2016-03-01 LAB — VITAMIN D 25 HYDROXY (VIT D DEFICIENCY, FRACTURES): VITD: 44.52 ng/mL (ref 30.00–100.00)

## 2016-03-01 LAB — TSH: TSH: 2.66 u[IU]/mL (ref 0.35–4.50)

## 2016-03-01 MED ORDER — AMLODIPINE BESYLATE 2.5 MG PO TABS: 2.5000 mg | ORAL_TABLET | Freq: Every day | ORAL | Status: DC

## 2016-03-01 NOTE — Progress Notes (Signed)
Pre-visit discussion using our clinic review tool. No additional management support is needed unless otherwise documented below in the visit note.  

## 2016-03-01 NOTE — Assessment & Plan Note (Signed)
Adding amlodipine 2.5  To losartan 100 mg

## 2016-03-01 NOTE — Progress Notes (Signed)
[name], female    DOB: Jun 09, 1925  Age: 82 y.o. MRN: [id number]  The patient is here for annual Medicare wellness examination and management of other chronic and acute problems.  Carotid dopplers done < 40% stenosis . 2 yr follow up advised  Referred her for elevated blood pressure Never started the Prolia  Due to concerns. About safety and cost Last meal 6 am cereal with flax seed /granola and fresh fruit  Lives alone in a one story house wit basement.  The stairs are wooden and indoors. Well lit,  Keeps light on ,  No falls Wears a Financial controller .  Has recurrent episodes of presyncope with exertion, not occurring daily      The risk factors are reflected in the social history.  The roster of all physicians providing medical care to patient - is listed in the Snapshot section of the chart.  Activities of daily living:  The patient is 100% independent in all ADLs: dressing, toileting, feeding as well as independent mobility  Home safety : The patient has smoke detectors in the home. They wear seatbelts.  There are no firearms at home. There is no violence in the home.   There is no risks for hepatitis, STDs or HIV. There is no   history of blood transfusion. They have no travel history to infectious disease endemic areas of the world.  The patient has seen their dentist in the last six month. They have seen their eye doctor in the last year . Had cataract surgery in one eay with complications,  Q000111Q.  Has been seeing ophthalmology at [facility] ever since,  . They admit to slight hearing difficulty with regard to whispered voices and some television programs.  They have deferred audiologic testing in the last year.  They do not  have excessive sun exposure. Discussed the need for sun protection: hats, long sleeves and use of sunscreen if there is significant sun exposure.   Diet: the importance of a healthy diet is discussed. They do have a healthy diet.  The benefits of  regular aerobic exercise were discussed. She walks 4 times per week ,  20 minutes.   Depression screen: there are no signs or vegative symptoms of depression- irritability, change in appetite, anhedonia, sadness/tearfullness.  Cognitive assessment: the patient manages all their financial and personal affairs and is actively engaged. They could relate day,date,year and events; recalled 2/3 objects at 3 minutes; performed clock-face test normally.  The following portions of the patient's history were reviewed and updated as appropriate: allergies, current medications, past family history, past medical history,  past surgical history, past social history  and problem list.  Visual acuity was not assessed per patient preference since she has regular follow up with her ophthalmologist. Hearing and body mass index were assessed and reviewed.   During the course of the visit the patient was educated and counseled about appropriate screening and preventive services including : fall prevention , diabetes screening, nutrition counseling, colorectal cancer screening, and recommended immunizations.    CC: The primary encounter diagnosis was Essential hypertension, benign. Diagnoses of Heart murmur, systolic, Pre-syncope, Hypothyroidism due to acquired atrophy of thyroid, Other iron deficiency anemias, Vitamin D deficiency, Hyperlipidemia, Medicare annual wellness visit, subsequent, and Encounter for preventive health examination were also pertinent to this visit.  Hypertension:   Elevated on last 2 OV's  .  Home readings not done    History Deanna White has a past medical history of Carotid  artery occlusion; Depression; Hypertension; Thyroid disease; Menopause; Allergy; Heart murmur; Varicose veins; Sore throat; Pneumonia; Colon polyps; Positive TB test; and Fall from slipping on ice (Feb. 2015).   She has past surgical history that includes Tonsillectomy; Cesarean section; Appendectomy; Abdominal hysterectomy;  Bilateral salpingoophorectomy (1964); and Eye surgery (Right, March 2016).   Her family history includes Alcohol abuse in her brother; Cancer in her brother, brother, and paternal grandmother; Deep vein thrombosis in her mother; Heart attack in her mother and sister; Heart disease in her brother, mother, sister, and sister; Hypertension in her mother and other; Kidney disease in her father; Other in her other; Varicose Veins in her sister.She reports that she has never smoked. She has never used smokeless tobacco. She reports that she does not drink alcohol or use illicit drugs.  Outpatient Prescriptions Prior to Visit  Medication Sig Dispense Refill  . aspirin EC 81 MG tablet Take 81 mg by mouth daily.      Marland Kitchen ketoconazole (NIZORAL) 2 % cream Apply 1 application topically daily. Feet    . levothyroxine (SYNTHROID, LEVOTHROID) 50 MCG tablet TAKE 1 TABLET BY MOUTH EVERY DAY 90 tablet 1  . losartan (COZAAR) 100 MG tablet Take 1 tablet (100 mg total) by mouth daily. 90 tablet 3  . Multiple Vitamins-Minerals (PRESERVISION AREDS 2) CAPS Take 2 capsules by mouth 2 (two) times daily.    Deanna White Glycol-Propyl Glycol 0.4-0.3 % SOLN Apply 1 drop to eye 3 (three) times daily as needed.    Marland Kitchen denosumab (PROLIA) 60 MG/ML SOLN injection Inject 60 mg into the skin every 6 (six) months. Administer in upper arm, thigh, or abdomen (Patient not taking: Reported on 10/15/2015) 180 mL 1  . Ascorbic Acid (VITAMIN C PO) Take 500 mg by mouth daily. Reported on 10/15/2015    . levothyroxine (SYNTHROID, LEVOTHROID) 50 MCG tablet TAKE 1 TABLET BY MOUTH EVERY DAY (Patient not taking: Reported on 10/15/2015) 90 tablet 1  . oseltamivir (TAMIFLU) 75 MG capsule Take 1 capsule (75 mg total) by mouth daily. 10 capsule 0   No facility-administered medications prior to visit.    Review of Systems   Patient denies headache, fevers, malaise, unintentional weight loss, skin rash, eye pain, sinus congestion and sinus pain, sore  throat, dysphagia,  hemoptysis , cough, dyspnea, wheezing, chest pain, palpitations, orthopnea, edema, abdominal pain, nausea, melena, diarrhea, constipation, flank pain, dysuria, hematuria, urinary  Frequency, nocturia, numbness, tingling, seizures,  Focal weakness, Loss of consciousness,  Tremor, insomnia, depression, anxiety, and suicidal ideation.      Objective:  BP 160/70 mmHg  Pulse 64  Temp(Src) 98.3 F (36.8 C) (Oral)  Resp 12  Ht 5\' 2"  (1.575 m)  Wt 117 lb 8 oz (53.298 kg)  BMI 21.49 kg/m2  SpO2 98%  Physical Exam  General appearance: alert, cooperative and appears stated age Head: Normocephalic, without obvious abnormality, atraumatic Eyes: conjunctivae/corneas clear. PERRL, EOM's intact. Fundi benign. Ears: normal TM's and external ear canals both ears Nose: Nares normal. Septum midline. Mucosa normal. No drainage or sinus tenderness. Throat: lips, mucosa, and tongue normal; teeth and gums normal Neck: no adenopathy, no carotid bruit, no JVD, supple, symmetrical, trachea midline and thyroid not enlarged, symmetric, no tenderness/mass/nodules Lungs: clear to auscultation bilaterally Breasts: normal appearance, no masses or tenderness Heart: regular rate and rhythm, S1, S2 normal, no murmur, click, rub or gallop Abdomen: soft, non-tender; bowel sounds normal; no masses,  no organomegaly Extremities: extremities normal, atraumatic, no cyanosis or edema Pulses: 2+ and  symmetric Skin: Skin color, texture, turgor normal. No rashes or lesions Neurologic: Alert and oriented X 3, normal strength and tone. Normal symmetric reflexes. Normal coordination and gait.     Assessment & Plan:   Problem List Items Addressed This Visit    Medicare annual wellness visit, subsequent    Annual Medicare wellness  exam was done as well as a comprehensive physical exam and management of acute and chronic conditions .  During the course of the visit the patient was educated and counseled about  appropriate screening and preventive services including : fall prevention , diabetes screening, nutrition counseling, colorectal cancer screening, and recommended immunizations.  Printed recommendations for health maintenance screenings was given.       Encounter for preventive health examination    Annual comprehensive preventive exam was done as well as an evaluation and management of chronic conditions .  During the course of the visit the patient was educated and counseled about appropriate screening and preventive services including :  diabetes screening, lipid analysis with projected  10 year  risk for CAD , nutrition counseling, breast, cervical and colorectal cancer screening, and recommended immunizations.  Printed recommendations for health maintenance screenings was give      Essential hypertension, benign - Primary    Adding amlodipine 2.5  To losartan 100 mg       Relevant Medications   amLODipine (NORVASC) 2.5 MG tablet   Other Relevant Orders   Comprehensive metabolic panel (Completed)   Anemia   Relevant Orders   CBC with Differential/Platelet (Completed)   Hypothyroidism   Relevant Orders   TSH (Completed)    Other Visit Diagnoses    Heart murmur, systolic        Relevant Orders    Ambulatory referral to Cardiology    Pre-syncope        Relevant Medications    amLODipine (NORVASC) 2.5 MG tablet    Other Relevant Orders    Ambulatory referral to Cardiology    Vitamin D deficiency        Relevant Orders    VITAMIN D 25 Hydroxy (Vit-D Deficiency, Fractures) (Completed)    Hyperlipidemia        Relevant Medications    amLODipine (NORVASC) 2.5 MG tablet    Other Relevant Orders    Lipid panel (Completed)       I have discontinued Deanna White's Ascorbic Acid (VITAMIN C PO) and oseltamivir. I am also having her start on amLODipine. Additionally, I am having her maintain her aspirin EC, ketoconazole, levothyroxine, PRESERVISION AREDS 2, Polyethyl Glycol-Propyl Glycol,  denosumab, and losartan.  Meds ordered this encounter  Medications  . amLODipine (NORVASC) 2.5 MG tablet    Sig: Take 1 tablet (2.5 mg total) by mouth daily.    Dispense:  90 tablet    Refill:  3    Medications Discontinued During This Encounter  Medication Reason  . Ascorbic Acid (VITAMIN C PO)   . levothyroxine (SYNTHROID, LEVOTHROID) 50 MCG tablet Duplicate  . oseltamivir (TAMIFLU) 75 MG capsule Completed Course    Follow-up: Return in about 6 months (around 08/31/2016) for follow up hypertension .   Crecencio Mc, MD

## 2016-03-01 NOTE — Patient Instructions (Signed)
Referral to Dr Erin Fulling to check out your heart  I have added amlodipine 2.5 mg daily for your blood pressure.  Continue taking losartan as well  Mammogram has been ordered  Menopause is a normal process in which your reproductive ability comes to an end. This process happens gradually over a span of months to years, usually between the ages of 38 and 80. Menopause is complete when you have missed 12 consecutive menstrual periods. It is important to talk with your health care provider about some of the most common conditions that affect postmenopausal women, such as heart disease, cancer, and bone loss (osteoporosis). Adopting a healthy lifestyle and getting preventive care can help to promote your health and wellness. Those actions can also lower your chances of developing some of these common conditions. WHAT SHOULD I KNOW ABOUT MENOPAUSE? During menopause, you may experience a number of symptoms, such as:  Moderate-to-severe hot flashes.  Night sweats.  Decrease in sex drive.  Mood swings.  Headaches.  Tiredness.  Irritability.  Memory problems.  Insomnia. Choosing to treat or not to treat menopausal changes is an individual decision that you make with your health care provider. WHAT SHOULD I KNOW ABOUT HORMONE REPLACEMENT THERAPY AND SUPPLEMENTS? Hormone therapy products are effective for treating symptoms that are associated with menopause, such as hot flashes and night sweats. Hormone replacement carries certain risks, especially as you become older. If you are thinking about using estrogen or estrogen with progestin treatments, discuss the benefits and risks with your health care provider. WHAT SHOULD I KNOW ABOUT HEART DISEASE AND STROKE? Heart disease, heart attack, and stroke become more likely as you age. This may be due, in part, to the hormonal changes that your body experiences during menopause. These can affect how your body processes dietary fats, triglycerides, and  cholesterol. Heart attack and stroke are both medical emergencies. There are many things that you can do to help prevent heart disease and stroke:  Have your blood pressure checked at least every 1-2 years. High blood pressure causes heart disease and increases the risk of stroke.  If you are 77-21 years old, ask your health care provider if you should take aspirin to prevent a heart attack or a stroke.  Do not use any tobacco products, including cigarettes, chewing tobacco, or electronic cigarettes. If you need help quitting, ask your health care provider.  It is important to eat a healthy diet and maintain a healthy weight.  Be sure to include plenty of vegetables, fruits, low-fat dairy products, and lean protein.  Avoid eating foods that are high in solid fats, added sugars, or salt (sodium).  Get regular exercise. This is one of the most important things that you can do for your health.  Try to exercise for at least 150 minutes each week. The type of exercise that you do should increase your heart rate and make you sweat. This is known as moderate-intensity exercise.  Try to do strengthening exercises at least twice each week. Do these in addition to the moderate-intensity exercise.  Know your numbers.Ask your health care provider to check your cholesterol and your blood glucose. Continue to have your blood tested as directed by your health care provider. WHAT SHOULD I KNOW ABOUT CANCER SCREENING? There are several types of cancer. Take the following steps to reduce your risk and to catch any cancer development as early as possible. Breast Cancer  Practice breast self-awareness.  This means understanding how your breasts normally appear and feel.  It also means doing regular breast self-exams. Let your health care provider know about any changes, no matter how small.  If you are 21 or older, have a clinician do a breast exam (clinical breast exam or CBE) every year. Depending on  your age, family history, and medical history, it may be recommended that you also have a yearly breast X-ray (mammogram).  If you have a family history of breast cancer, talk with your health care provider about genetic screening.  If you are at high risk for breast cancer, talk with your health care provider about having an MRI and a mammogram every year.  Breast cancer (BRCA) gene test is recommended for women who have family members with BRCA-related cancers. Results of the assessment will determine the need for genetic counseling and BRCA1 and for BRCA2 testing. BRCA-related cancers include these types:  Breast. This occurs in males or females.  Ovarian.  Tubal. This may also be called fallopian tube cancer.  Cancer of the abdominal or pelvic lining (peritoneal cancer).  Prostate.  Pancreatic. Cervical, Uterine, and Ovarian Cancer Your health care provider may recommend that you be screened regularly for cancer of the pelvic organs. These include your ovaries, uterus, and vagina. This screening involves a pelvic exam, which includes checking for microscopic changes to the surface of your cervix (Pap test).  For women ages 21-65, health care providers may recommend a pelvic exam and a Pap test every three years. For women ages 40-65, they may recommend the Pap test and pelvic exam, combined with testing for human papilloma virus (HPV), every five years. Some types of HPV increase your risk of cervical cancer. Testing for HPV may also be done on women of any age who have unclear Pap test results.  Other health care providers may not recommend any screening for nonpregnant women who are considered low risk for pelvic cancer and have no symptoms. Ask your health care provider if a screening pelvic exam is right for you.  If you have had past treatment for cervical cancer or a condition that could lead to cancer, you need Pap tests and screening for cancer for at least 20 years after your  treatment. If Pap tests have been discontinued for you, your risk factors (such as having a new sexual partner) need to be reassessed to determine if you should start having screenings again. Some women have medical problems that increase the chance of getting cervical cancer. In these cases, your health care provider may recommend that you have screening and Pap tests more often.  If you have a family history of uterine cancer or ovarian cancer, talk with your health care provider about genetic screening.  If you have vaginal bleeding after reaching menopause, tell your health care provider.  There are currently no reliable tests available to screen for ovarian cancer. Lung Cancer Lung cancer screening is recommended for adults 93-51 years old who are at high risk for lung cancer because of a history of smoking. A yearly low-dose CT scan of the lungs is recommended if you:  Currently smoke.  Have a history of at least 30 pack-years of smoking and you currently smoke or have quit within the past 15 years. A pack-year is smoking an average of one pack of cigarettes per day for one year. Yearly screening should:  Continue until it has been 15 years since you quit.  Stop if you develop a health problem that would prevent you from having lung cancer treatment. Colorectal Cancer  This type of cancer can be detected and can often be prevented.  Routine colorectal cancer screening usually begins at age 47 and continues through age 39.  If you have risk factors for colon cancer, your health care provider may recommend that you be screened at an earlier age.  If you have a family history of colorectal cancer, talk with your health care provider about genetic screening.  Your health care provider may also recommend using home test kits to check for hidden blood in your stool.  A small camera at the end of a tube can be used to examine your colon directly (sigmoidoscopy or colonoscopy). This is  done to check for the earliest forms of colorectal cancer.  Direct examination of the colon should be repeated every 5-10 years until age 65. However, if early forms of precancerous polyps or small growths are found or if you have a family history or genetic risk for colorectal cancer, you may need to be screened more often. Skin Cancer  Check your skin from head to toe regularly.  Monitor any moles. Be sure to tell your health care provider:  About any new moles or changes in moles, especially if there is a change in a mole's shape or color.  If you have a mole that is larger than the size of a pencil eraser.  If any of your family members has a history of skin cancer, especially at a young age, talk with your health care provider about genetic screening.  Always use sunscreen. Apply sunscreen liberally and repeatedly throughout the day.  Whenever you are outside, protect yourself by wearing long sleeves, pants, a wide-brimmed hat, and sunglasses. WHAT SHOULD I KNOW ABOUT OSTEOPOROSIS? Osteoporosis is a condition in which bone destruction happens more quickly than new bone creation. After menopause, you may be at an increased risk for osteoporosis. To help prevent osteoporosis or the bone fractures that can happen because of osteoporosis, the following is recommended:  If you are 59-59 years old, get at least 1,000 mg of calcium and at least 600 mg of vitamin D per day.  If you are older than age 74 but younger than age 79, get at least 1,200 mg of calcium and at least 600 mg of vitamin D per day.  If you are older than age 3, get at least 1,200 mg of calcium and at least 800 mg of vitamin D per day. Smoking and excessive alcohol intake increase the risk of osteoporosis. Eat foods that are rich in calcium and vitamin D, and do weight-bearing exercises several times each week as directed by your health care provider. WHAT SHOULD I KNOW ABOUT HOW MENOPAUSE AFFECTS [place]? Depression may occur at any age, but it is more common as you become older. Common symptoms of depression include:  Low or sad mood.  Changes in sleep patterns.  Changes in appetite or eating patterns.  Feeling an overall lack of motivation or enjoyment of activities that you previously enjoyed.  Frequent crying spells. Talk with your health care provider if you think that you are experiencing depression. WHAT SHOULD I KNOW ABOUT IMMUNIZATIONS? It is important that you get and maintain your immunizations. These include:  Tetanus, diphtheria, and pertussis (Tdap) booster vaccine.  Influenza every year before the flu season begins.  Pneumonia vaccine.  Shingles vaccine. Your health care provider may also recommend other immunizations.   This information is not intended to replace advice given to you by your health care  provider. Make sure you discuss any questions you have with your health care provider.   Document Released: 11/10/2005 Document Revised: 10/09/2014 Document Reviewed: 05/21/2014 Elsevier Interactive Patient Education 2016 [employer]  

## 2016-03-03 DIAGNOSIS — Z0000 Encounter for general adult medical examination without abnormal findings: Secondary | ICD-10-CM | POA: Insufficient documentation

## 2016-03-03 NOTE — Assessment & Plan Note (Signed)

## 2016-03-03 NOTE — Assessment & Plan Note (Signed)
Annual comprehensive preventive exam was done as well as an evaluation and management of chronic conditions .  During the course of the visit the patient was educated and counseled about appropriate screening and preventive services including :  diabetes screening, lipid analysis with projected  10 year  risk for CAD , nutrition counseling, breast, cervical and colorectal cancer screening, and recommended immunizations.  Printed recommendations for health maintenance screenings was give 

## 2016-03-07 ENCOUNTER — Encounter: Payer: Self-pay | Admitting: *Deleted

## 2016-03-27 DIAGNOSIS — E782 Mixed hyperlipidemia: Secondary | ICD-10-CM | POA: Insufficient documentation

## 2016-04-06 ENCOUNTER — Other Ambulatory Visit: Payer: Self-pay | Admitting: Internal Medicine

## 2016-04-13 DIAGNOSIS — I480 Paroxysmal atrial fibrillation: Secondary | ICD-10-CM | POA: Insufficient documentation

## 2016-08-04 ENCOUNTER — Other Ambulatory Visit: Payer: Self-pay | Admitting: Internal Medicine

## 2016-08-31 ENCOUNTER — Ambulatory Visit (INDEPENDENT_AMBULATORY_CARE_PROVIDER_SITE_OTHER): Payer: Medicare Other | Admitting: Internal Medicine

## 2016-08-31 ENCOUNTER — Encounter: Payer: Self-pay | Admitting: Internal Medicine

## 2016-08-31 VITALS — BP 160/68 | HR 64 | Temp 97.6°F | Resp 12 | Ht 62.0 in | Wt 120.0 lb

## 2016-08-31 DIAGNOSIS — R143 Flatulence: Secondary | ICD-10-CM | POA: Diagnosis not present

## 2016-08-31 DIAGNOSIS — I10 Essential (primary) hypertension: Secondary | ICD-10-CM

## 2016-08-31 DIAGNOSIS — I6523 Occlusion and stenosis of bilateral carotid arteries: Secondary | ICD-10-CM | POA: Diagnosis not present

## 2016-08-31 DIAGNOSIS — E034 Atrophy of thyroid (acquired): Secondary | ICD-10-CM

## 2016-08-31 DIAGNOSIS — M810 Age-related osteoporosis without current pathological fracture: Secondary | ICD-10-CM

## 2016-08-31 DIAGNOSIS — I480 Paroxysmal atrial fibrillation: Secondary | ICD-10-CM

## 2016-08-31 LAB — COMPREHENSIVE METABOLIC PANEL
ALBUMIN: 4 g/dL (ref 3.5–5.2)
ALT: 11 U/L (ref 0–35)
AST: 21 U/L (ref 0–37)
Alkaline Phosphatase: 57 U/L (ref 39–117)
BUN: 21 mg/dL (ref 6–23)
CHLORIDE: 107 meq/L (ref 96–112)
CO2: 28 mEq/L (ref 19–32)
CREATININE: 1.02 mg/dL (ref 0.40–1.20)
Calcium: 9.3 mg/dL (ref 8.4–10.5)
GFR: 54.24 mL/min — ABNORMAL LOW (ref >60.00)
GLUCOSE: 87 mg/dL (ref 70–99)
Potassium: 4.8 mEq/L (ref 3.5–5.1)
SODIUM: 141 meq/L (ref 135–145)
TOTAL PROTEIN: 5.9 g/dL — AB (ref 6.0–8.3)
Total Bilirubin: 0.3 mg/dL (ref 0.2–1.2)

## 2016-08-31 LAB — TSH: TSH: 4.08 u[IU]/mL (ref 0.35–4.50)

## 2016-08-31 LAB — LDL CHOLESTEROL, DIRECT: LDL DIRECT: 76 mg/dL

## 2016-08-31 NOTE — Progress Notes (Signed)
Pre-visit discussion using our clinic review tool. No additional management support is needed unless otherwise documented below in the visit note.  

## 2016-08-31 NOTE — Progress Notes (Signed)
Subjective:  [name], female    DOB: [birth year]  Age: 82 y.o. MRN: [id number]  CC: The primary encounter diagnosis was Carotid stenosis, bilateral. Diagnoses of Essential hypertension, benign, Hypothyroidism due to acquired atrophy of thyroid, Flatulence, Paroxysmal atrial fibrillation (HCC), and Postmenopausal osteoporosis were also pertinent to this visit.  HPI Deanna White presents for follow up on hypothyroidism,  Hypertension,osteoporosis,  and presyncope . Last seen in June for wellness visit.   Amlodipine 2.5 mg was added to losartan. Cardiology referral was made for evaluation of presyncope/ severe dizziness occurring with exertion .   Underwent ECHO, stress test and wore a Holter monitor In October for evaluation of syncope  Holter monitor noted atrial fib, PVCs and PACs.  Stress test was normal . ECHO was negative for valvular abnormalities.  A  Beta blocker  Was started  For qhs use  by Nehemiah Massed,  but was stopped  She continues to have  Elevated blood pressure readings in the office.  ,  and amlodipine was also stopped by Dr Nehemiah Massed.   Last BP in his office was 127/78 on losartan alone     Having Right hip pain that radiates to knee. Present for the past week .  Not constant .  attributes it to a remote fall onto ice ,  Fell onto right sacrum,  Years ago.   afradi to take anythign for it.  Has normal Cr   Patient has deferred treatment for osteoporosis (Prolia) due to fear of medication and cost.  Has a history of  vertebral fracture in 2015 and remote left tibial fracture.  After discussion today,  She is willing to reconsider .  Taking Preservision vitamin ,  No additional calcium.  Diet reviewed,she drinks almond milk daily and has several servings od dairy daily as well  .  Vit D was normal in May at 92. She is  also drinking a protein drink   having excessive gas without constipation, nausea or abdominal pain.    Outpatient Medications Prior to Visit    Medication Sig Dispense Refill  . aspirin EC 81 MG tablet Take 81 mg by mouth daily.      Marland Kitchen ketoconazole (NIZORAL) 2 % cream Apply 1 application topically daily. Feet    . levothyroxine (SYNTHROID, LEVOTHROID) 50 MCG tablet TAKE 1 TABLET BY MOUTH EVERY DAY 90 tablet 1  . losartan (COZAAR) 100 MG tablet TAKE 1 TABLET (100 MG TOTAL) BY MOUTH DAILY. 90 tablet 1  . Multiple Vitamins-Minerals (PRESERVISION AREDS 2) CAPS Take 2 capsules by mouth 2 (two) times daily.    Marland Kitchen amLODipine (NORVASC) 2.5 MG tablet Take 1 tablet (2.5 mg total) by mouth daily. (Patient not taking: Reported on 08/31/2016) 90 tablet 3  . denosumab (PROLIA) 60 MG/ML SOLN injection Inject 60 mg into the skin every 6 (six) months. Administer in upper arm, thigh, or abdomen (Patient not taking: Reported on 08/31/2016) 180 mL 1  . levothyroxine (SYNTHROID, LEVOTHROID) 50 MCG tablet TAKE 1 TABLET BY MOUTH EVERY DAY 90 tablet 3  . Polyethyl Glycol-Propyl Glycol 0.4-0.3 % SOLN Apply 1 drop to eye 3 (three) times daily as needed.     No facility-administered medications prior to visit.     Review of Systems;  Patient denies headache, fevers, malaise, unintentional weight loss, skin rash, eye pain, sinus congestion and sinus pain, sore throat, dysphagia,  hemoptysis , cough, dyspnea, wheezing, chest pain, palpitations, orthopnea, edema, abdominal pain, nausea, melena, diarrhea, constipation, flank  pain, dysuria, hematuria, urinary  Frequency, nocturia, numbness, tingling, seizures,  Focal weakness, Loss of consciousness,  Tremor, insomnia, depression, anxiety, and suicidal ideation.      Objective:  BP (!) 160/68   Pulse 64   Temp 97.6 F (36.4 C) (Oral)   Resp 12   Ht 5\' 2"  (1.575 m)   Wt 120 lb (54.4 kg)   SpO2 98%   BMI 21.95 kg/m   BP Readings from Last 3 Encounters:  08/31/16 (!) 160/68  03/01/16 (!) 160/70  10/15/15 (!) 172/74    Wt Readings from Last 3 Encounters:  08/31/16 120 lb (54.4 kg)  03/01/16 117 lb 8 oz  (53.3 kg)  10/15/15 120 lb (54.4 kg)    General appearance: alert, cooperative and appears stated age Ears: normal TM's and external ear canals both ears Throat: lips, mucosa, and tongue normal; teeth and gums normal Neck: no adenopathy, no carotid bruit, supple, symmetrical, trachea midline and thyroid not enlarged, symmetric, no tenderness/mass/nodules Back: symmetric, no curvature. ROM normal. No CVA tenderness. Lungs: clear to auscultation bilaterally Heart: regular rate and rhythm, S1, S2 normal, no murmur, click, rub or gallop Abdomen: soft, non-tender; bowel sounds normal; no masses,  no organomegaly Pulses: 2+ and symmetric Skin: Skin color, texture, turgor normal. No rashes or lesions Lymph nodes: Cervical, supraclavicular, and axillary nodes normal.  No results found for: HGBA1C  Lab Results  Component Value Date   CREATININE 1.02 08/31/2016   CREATININE 1.07 03/01/2016   CREATININE 0.95 02/22/2015    Lab Results  Component Value Date   WBC 5.0 03/01/2016   HGB 11.1 (L) 03/01/2016   HCT 33.0 (L) 03/01/2016   PLT 141.0 (L) 03/01/2016   GLUCOSE 87 08/31/2016   CHOL 178 03/01/2016   TRIG 73.0 03/01/2016   HDL 64.20 03/01/2016   LDLDIRECT 76.0 08/31/2016   LDLCALC 100 (H) 03/01/2016   ALT 11 08/31/2016   AST 21 08/31/2016   NA 141 08/31/2016   K 4.8 08/31/2016   CL 107 08/31/2016   CREATININE 1.02 08/31/2016   BUN 21 08/31/2016   CO2 28 08/31/2016   TSH 4.08 08/31/2016    No results found.  Assessment & Plan:   Problem List Items Addressed This Visit    Carotid stenosis, bilateral - Primary   Relevant Orders   LDL cholesterol, direct (Completed)   Essential hypertension, benign    Elevated in office , but not at home per patient.  she reports compliance with medication regimen  And has been asked to bring her home monitor to the office for calibration  Renal function is unchanged and low end of normal on losartan   Lab Results  Component Value Date    CREATININE 1.02 08/31/2016   Lab Results  Component Value Date   NA 141 08/31/2016   K 4.8 08/31/2016   CL 107 08/31/2016   CO2 28 08/31/2016         Relevant Orders   Comprehensive metabolic panel (Completed)   Flatulence    Advised to try adding Beano with any meal containing legumes (hard beans,  Or cruciferous vegetables  (broccoli, cauliflower, brussel sprouts); Lactase with any meal containing  Dairy; and Phasyme or Gas X for gas that occurs after a meal       Hypothyroidism    Thyroid function is WNL on current dose.  No current changes, repeat tsh  in May.   Lab Results  Component Value Date   TSH 4.08 08/31/2016  Relevant Orders   TSH (Completed)   Paroxysmal atrial fibrillation ([place])    She did not tolerate a beta blockade trial by dr Nehemiah Massed after comprehensive noninvasive workup showed nonvalvular atrial fibrillation . Anticoagulation is contraindicated due to history of falls.       Postmenopausal osteoporosis    She has had prior fractures .  Given her age, only [place] can be considered safe.  She has had concern about side effects that  Have been allayed today and is willing to consider treatment if affordable.        A total of 40 minutes was spent with patient more than half of which was spent in counseling patient on the above mentioned issues , reviewing and explaining recent labs and imaging studies done, and coordination of care.  I have discontinued Ms. Randa's Polyethyl Glycol-Propyl Glycol, denosumab, and amLODipine. I am also having her maintain her aspirin EC, ketoconazole, levothyroxine, PRESERVISION AREDS 2, losartan, and dorzolamide.  Meds ordered this encounter  Medications  . dorzolamide (TRUSOPT) 2 % ophthalmic solution    Sig: PLACE 1 DROP INTO BOTH EYES 2 (TWO) TIMES DAILY.    Refill:  12    Medications Discontinued During This Encounter  Medication Reason  . levothyroxine (SYNTHROID, LEVOTHROID) 50 MCG tablet Duplicate    . denosumab (PROLIA) 60 MG/ML SOLN injection Change in therapy  . Polyethyl Glycol-Propyl Glycol 0.4-0.3 % SOLN Change in therapy  . amLODipine (NORVASC) 2.5 MG tablet Change in therapy    Follow-up: Return in about 6 months (around 02/28/2017).   Crecencio Mc, MD

## 2016-08-31 NOTE — Patient Instructions (Addendum)
1) Your blood pressure is HIGH TODAY   Bring your home machine back to the office for a nurse visit to calibrate your machine   2) Please reconsider treatment for your osteoporosis with Prolia,  We will find out the cost   1000 Ius D3 daily  Is needed for bone health, and 1800 mg calcium through  diet and  supplements    3) To prevent gas:  Take Beano with any meal containing legumes (hard beans0,  Or cruciferous vegetables  (broccoli, cauliflower, brussel sprouts)  Take Lactase with any meal containing  dairy  You can try Phasyme or Gas X for gas that occurs after a meal

## 2016-09-03 DIAGNOSIS — I480 Paroxysmal atrial fibrillation: Secondary | ICD-10-CM

## 2016-09-03 DIAGNOSIS — R143 Flatulence: Secondary | ICD-10-CM | POA: Insufficient documentation

## 2016-09-03 NOTE — Assessment & Plan Note (Signed)
She has had prior fractures .  Given her age, only Deanna White can be considered safe.  She has had concern about side effects that  Have been allayed today and is willing to consider treatment if affordable.

## 2016-09-03 NOTE — Assessment & Plan Note (Signed)
Advised to try adding Beano with any meal containing legumes (hard beans,  Or cruciferous vegetables  (broccoli, cauliflower, brussel sprouts); Lactase with any meal containing  Dairy; and Phasyme or Gas X for gas that occurs after a meal

## 2016-09-03 NOTE — Assessment & Plan Note (Addendum)
She did not tolerate a beta blockade trial by dr kowalski after comprehensive noninvasive workup showed nonvalvular atrial fibrillation . Anticoagulation is contraindicated due to history of falls.  

## 2016-09-03 NOTE — Assessment & Plan Note (Signed)
Thyroid function is WNL on current dose.  No current changes, repeat tsh  in May.   Lab Results  Component Value Date   TSH 4.08 08/31/2016

## 2016-09-03 NOTE — Assessment & Plan Note (Signed)
Elevated in office , but not at home per patient.  she reports compliance with medication regimen  And has been asked to bring her home monitor to the office for calibration  Renal function is unchanged and low end of normal on losartan   Lab Results  Component Value Date   CREATININE 1.02 08/31/2016   Lab Results  Component Value Date   NA 141 08/31/2016   K 4.8 08/31/2016   CL 107 08/31/2016   CO2 28 08/31/2016

## 2016-09-05 ENCOUNTER — Encounter: Payer: Self-pay | Admitting: *Deleted

## 2016-09-21 NOTE — Progress Notes (Signed)
prolia will be submitted in January.

## 2016-10-12 ENCOUNTER — Telehealth: Payer: Self-pay | Admitting: Internal Medicine

## 2016-10-12 DIAGNOSIS — M810 Age-related osteoporosis without current pathological fracture: Secondary | ICD-10-CM

## 2016-10-12 NOTE — Telephone Encounter (Signed)
Called to schedule patient for prolia injection , patient declined due to all the side effects she read about and saw on television , feels at her age she is better to proceed without Prolia, patient is approved for Prolia cost would be $80

## 2016-10-12 NOTE — Assessment & Plan Note (Signed)
Patient was approved for Prolia but declined therapy due to risk of side effects

## 2017-01-29 ENCOUNTER — Other Ambulatory Visit: Payer: Self-pay | Admitting: Internal Medicine

## 2017-02-28 ENCOUNTER — Encounter: Payer: Self-pay | Admitting: Internal Medicine

## 2017-02-28 ENCOUNTER — Ambulatory Visit (INDEPENDENT_AMBULATORY_CARE_PROVIDER_SITE_OTHER): Payer: Medicare Other | Admitting: Internal Medicine

## 2017-02-28 VITALS — BP 162/70 | HR 69 | Temp 97.8°F | Resp 15 | Ht 62.0 in | Wt 117.8 lb

## 2017-02-28 DIAGNOSIS — I739 Peripheral vascular disease, unspecified: Secondary | ICD-10-CM

## 2017-02-28 DIAGNOSIS — M810 Age-related osteoporosis without current pathological fracture: Secondary | ICD-10-CM

## 2017-02-28 DIAGNOSIS — E034 Atrophy of thyroid (acquired): Secondary | ICD-10-CM | POA: Diagnosis not present

## 2017-02-28 DIAGNOSIS — I10 Essential (primary) hypertension: Secondary | ICD-10-CM

## 2017-02-28 DIAGNOSIS — K5901 Slow transit constipation: Secondary | ICD-10-CM | POA: Diagnosis not present

## 2017-02-28 LAB — BASIC METABOLIC PANEL
BUN: 16 mg/dL (ref 6–23)
CHLORIDE: 106 meq/L (ref 96–112)
CO2: 29 meq/L (ref 19–32)
Calcium: 9.2 mg/dL (ref 8.4–10.5)
Creatinine, Ser: 1.01 mg/dL (ref 0.40–1.20)
GFR: 54.8 mL/min — ABNORMAL LOW (ref >60.00)
GLUCOSE: 93 mg/dL (ref 70–99)
POTASSIUM: 4.5 meq/L (ref 3.5–5.1)
Sodium: 139 mEq/L (ref 135–145)

## 2017-02-28 LAB — TSH: TSH: 1.93 u[IU]/mL (ref 0.35–4.50)

## 2017-02-28 NOTE — Progress Notes (Signed)
Subjective:  [name], female    DOB: [birth year]  Age: 83 y.o. MRN: [id number]  CC: The primary encounter diagnosis was Essential hypertension. Diagnoses of Hypothyroidism due to acquired atrophy of thyroid, Osteoporosis of vertebra, Essential hypertension, benign, Peripheral vascular disease ([place]), and Slow transit constipation were also pertinent to this visit.  HPI Deanna White presents for follow up on hypertension and other issues.   Home  readings typically have been <  601 systolic .  Home machine brought in today for calibration.  machine overestimating readings  by 15 pts.    Currently constipated.  Eating home made granola made from oatmeal , nuts, Coconut and 1/2 cup honey  and fruits   history of vertebral  Fracture march 2015, L1 , possibly L2,  Had PT.  Pain has improved significantly. Last DEXA was repeated in 2015. T score -2.4  .  She has refused Prolia injections.   Outpatient Medications Prior to Visit  Medication Sig Dispense Refill  . aspirin EC 81 MG tablet Take 81 mg by mouth daily.      . dorzolamide (TRUSOPT) 2 % ophthalmic solution PLACE 1 DROP INTO BOTH EYES 2 (TWO) TIMES DAILY.  12  . levothyroxine (SYNTHROID, LEVOTHROID) 50 MCG tablet TAKE 1 TABLET BY MOUTH EVERY DAY 90 tablet 1  . losartan (COZAAR) 100 MG tablet TAKE 1 TABLET (100 MG TOTAL) BY MOUTH DAILY. 90 tablet 0  . Multiple Vitamins-Minerals (PRESERVISION AREDS 2) CAPS Take 2 capsules by mouth 2 (two) times daily.    Marland Kitchen ketoconazole (NIZORAL) 2 % cream Apply 1 application topically daily. Feet     No facility-administered medications prior to visit.     Review of Systems;  Patient denies headache, fevers, malaise, unintentional weight loss, skin rash, eye pain, sinus congestion and sinus pain, sore throat, dysphagia,  hemoptysis , cough, dyspnea, wheezing, chest pain, palpitations, orthopnea, edema, abdominal pain, nausea, melena, diarrhea, constipation, flank pain, dysuria,  hematuria, urinary  Frequency, nocturia, numbness, tingling, seizures,  Focal weakness, Loss of consciousness,  Tremor, insomnia, depression, anxiety, and suicidal ideation.      Objective:  BP (!) 162/70 (BP Location: Left Arm, Patient Position: Sitting, Cuff Size: Normal)   Pulse 69   Temp 97.8 F (36.6 C) (Oral)   Resp 15   Ht 5\' 2"  (1.575 m)   Wt 117 lb 12.8 oz (53.4 kg)   SpO2 97%   BMI 21.55 kg/m   BP Readings from Last 3 Encounters:  02/28/17 (!) 162/70  08/31/16 (!) 160/68  03/01/16 (!) 160/70    Wt Readings from Last 3 Encounters:  02/28/17 117 lb 12.8 oz (53.4 kg)  08/31/16 120 lb (54.4 kg)  03/01/16 117 lb 8 oz (53.3 kg)    General appearance: alert, cooperative and appears stated age Ears: normal TM's and external ear canals both ears Throat: lips, mucosa, and tongue normal; teeth and gums normal Neck: no adenopathy, no carotid bruit, supple, symmetrical, trachea midline and thyroid not enlarged, symmetric, no tenderness/mass/nodules Back: symmetric, no curvature. ROM normal. No CVA tenderness. Lungs: clear to auscultation bilaterally Heart: regular rate and rhythm, S1, S2 normal, no murmur, click, rub or gallop Abdomen: soft, non-tender; bowel sounds normal; no masses,  no organomegaly Pulses: 2+ and symmetric Skin: Skin color, texture, turgor normal. No rashes or lesions Lymph nodes: Cervical, supraclavicular, and axillary nodes normal.  No results found for: HGBA1C  Lab Results  Component Value Date   CREATININE 1.01 02/28/2017  CREATININE 1.02 08/31/2016   CREATININE 1.07 03/01/2016    Lab Results  Component Value Date   WBC 5.0 03/01/2016   HGB 11.1 (L) 03/01/2016   HCT 33.0 (L) 03/01/2016   PLT 141.0 (L) 03/01/2016   GLUCOSE 93 02/28/2017   CHOL 178 03/01/2016   TRIG 73.0 03/01/2016   HDL 64.20 03/01/2016   LDLDIRECT 76.0 08/31/2016   LDLCALC 100 (H) 03/01/2016   ALT 11 08/31/2016   AST 21 08/31/2016   NA 139 02/28/2017   K 4.5  02/28/2017   CL 106 02/28/2017   CREATININE 1.01 02/28/2017   BUN 16 02/28/2017   CO2 29 02/28/2017   TSH 1.93 02/28/2017    No results found.  Assessment & Plan:   Problem List Items Addressed This Visit    Peripheral vascular disease ([place])    <40% stenosis bilateral by Jan 2017 carotid ultrasound/dopplers.  No change since 2013.  Continue aspiring,  Statin advised but deferred by patient.       Osteoporosis of vertebra    With prior lumbar fracture during a fall. Urged to reconsider Prolia      Hypothyroidism    Thyroid function is WNL on current dose.  No current changes needed.   Lab Results  Component Value Date   TSH 1.93 02/28/2017         Relevant Orders   TSH (Completed)   Essential hypertension, benign    Readings are elevated here but hoe readings have been < 150   Will not treat more aggressively unless her systolic is over 588 at home       Constipation    Recommended an increase in fiber intake to 25 to 35 grams daily.  Made several dietary suggestions of high fiber content including [place] low carb whole wheat tortillas which have 26 g fiber/serving, Toufayan flatbread which has around 10 g fiber,   Daily serving of any type of nuts,  and Atkins protein bars which have 8 to 10 g fiber.   Dispelled the popularly held notion that [employer] oatmeal, whole grain bread and most commericially made cereals have adequate fiber.  Finally I recommended daily use of Miralax., metamucil, citrucel, benefiber  or fibercon.        Other Visit Diagnoses    Essential hypertension    -  Primary   Relevant Orders   Basic metabolic panel (Completed)    A total of 25 minutes of face to face time was spent with patient more than half of which was spent in counselling about the above mentioned conditions  and coordination of care   I have discontinued Ms. Samons's ketoconazole. I am also having her maintain her aspirin EC, levothyroxine, PRESERVISION AREDS 2, dorzolamide,  and losartan.  No orders of the defined types were placed in this encounter.   Medications Discontinued During This Encounter  Medication Reason  . ketoconazole (NIZORAL) 2 % cream Patient has not taken in last 30 days    Follow-up: No Follow-up on file.   Crecencio Mc, MD

## 2017-02-28 NOTE — Patient Instructions (Addendum)
Your home blood pressure machine is reading about 15 points lower than ours.  So as long as  Your  home readings are < 155/90 ,  You are under good control and can continue your current medications  (losartan)       For your constipation:  The first course is to increase the fiber in your diet to 25 g daily . Fruit and vegetables are good sources,  The [employer] and Atkins protein bars , and the low carb breads   (see below)  are all heavy on  fiber,    You can also take miralax, metamucil, fibercon, or citrucel daily to supplement your fiber. These are gentle and work in 1 to 2 days to relieve constipation, and  you can also combine them daily with colace,  A stool softener .  Also,  make 3 16 ounce servings of wtaer your minimum goal for water intake     HERE ARE THE LOW CARB  BREAD CHOICES  THAT ARE HIGHER IN FIBER THAN THE WELL KNOWN BREADS.  The [place]A is  MADE FROM WHOLE WHEAT and HAS 26 G FIBER ! THESE CAN ALL  ALSO  BE BAKED TO CREATE PITA CHIPS

## 2017-03-02 ENCOUNTER — Encounter: Payer: Self-pay | Admitting: *Deleted

## 2017-03-03 DIAGNOSIS — K5900 Constipation, unspecified: Secondary | ICD-10-CM | POA: Insufficient documentation

## 2017-03-03 NOTE — Assessment & Plan Note (Signed)
Recommended an increase in fiber intake to 25 to 35 grams daily.  Made several dietary suggestions of high fiber content including [place] low carb whole wheat tortillas which have 26 g fiber/serving, Toufayan flatbread which has around 10 g fiber,   Daily serving of any type of nuts,  and Atkins protein bars which have 8 to 10 g fiber.   Dispelled the popularly held notion that Quaker oats oatmeal, whole grain bread and most commericially made cereals have adequate fiber.  Finally I recommended daily use of Miralax., metamucil, citrucel, benefiber  or fibercon.  

## 2017-03-03 NOTE — Assessment & Plan Note (Signed)
With prior lumbar fracture during a fall. Urged to reconsider Prolia

## 2017-03-03 NOTE — Assessment & Plan Note (Signed)
Thyroid function is WNL on current dose.  No current changes needed.   Lab Results  Component Value Date   TSH 1.93 02/28/2017

## 2017-03-03 NOTE — Assessment & Plan Note (Signed)
Readings are elevated here but hoe readings have been < 150   Will not treat more aggressively unless her systolic is over 872 at home

## 2017-03-03 NOTE — Assessment & Plan Note (Signed)
<  40% stenosis bilateral by Jan 2017 carotid ultrasound/dopplers.  No change since 2013.  Continue aspiring,  Statin advised but deferred by patient.

## 2017-03-08 ENCOUNTER — Ambulatory Visit: Payer: Self-pay | Admitting: Podiatry

## 2017-04-02 ENCOUNTER — Other Ambulatory Visit: Payer: Self-pay | Admitting: Internal Medicine

## 2017-04-17 DIAGNOSIS — I340 Nonrheumatic mitral (valve) insufficiency: Secondary | ICD-10-CM | POA: Insufficient documentation

## 2017-04-27 ENCOUNTER — Other Ambulatory Visit: Payer: Self-pay | Admitting: Internal Medicine

## 2017-08-01 ENCOUNTER — Ambulatory Visit (INDEPENDENT_AMBULATORY_CARE_PROVIDER_SITE_OTHER): Payer: Medicare Other

## 2017-08-01 VITALS — BP 160/72 | HR 70 | Temp 98.2°F | Resp 12 | Ht 62.0 in | Wt 119.1 lb

## 2017-08-01 DIAGNOSIS — Z0000 Encounter for general adult medical examination without abnormal findings: Secondary | ICD-10-CM | POA: Diagnosis not present

## 2017-08-01 NOTE — Patient Instructions (Addendum)
  Deanna White , Thank you for taking time to come for your Medicare Wellness Visit. I appreciate your ongoing commitment to your health goals. Please review the following plan we discussed and let me know if I can assist you in the future.   Follow up with Dr. Derrel Nip as needed.    Bring a copy of your [place] and/or Living Will to be scanned into chart once completed.  Have a great day!  These are the goals we discussed: Goals    . Increase water intake          Stay hydrated. Finish entire cup of water when taking medications.       This is a list of the screening recommended for you and due dates:  Health Maintenance  Topic Date Due  . Tetanus Vaccine  10/02/2016  . Flu Shot  Completed  . DEXA scan (bone density measurement)  Completed  . Pneumonia vaccines  Completed

## 2017-08-01 NOTE — Progress Notes (Signed)
Subjective:   Deanna White is a 83 y.o. female who presents for Medicare Annual (Subsequent) preventive examination.  Review of Systems:  No ROS.  Medicare Wellness Visit. Additional risk factors are reflected in the social history.  Cardiac Risk Factors include: advanced age (>49men, >29 women);hypertension     Objective:     Vitals: BP (!) 160/72 (BP Location: Left Arm, Patient Position: Sitting, Cuff Size: Normal)   Pulse 70   Temp 98.2 F (36.8 C) (Oral)   Resp 12   Ht 5\' 2"  (1.575 m)   Wt 119 lb 1.9 oz (54 kg)   SpO2 98%   BMI 21.79 kg/m   Body mass index is 21.79 kg/m.   Tobacco History  Smoking Status  . Never Smoker  Smokeless Tobacco  . Never Used     Counseling given: Not Answered   Past Medical History:  Diagnosis Date  . Allergy   . Carotid artery occlusion   . Colon polyps   . Depression   . Fall from slipping on ice Feb. 2015  . Heart murmur   . Hypertension   . Menopause   . Pneumonia   . Positive TB test   . Sore throat   . Thyroid disease   . Varicose veins    Past Surgical History:  Procedure Laterality Date  . ABDOMINAL HYSTERECTOMY    . APPENDECTOMY    . BILATERAL SALPINGOOPHORECTOMY  1964  . CESAREAN SECTION    . EYE SURGERY Right March 2016   Cataract  . TONSILLECTOMY     Family History  Problem Relation Age of Onset  . Heart disease Mother        After age 68  . Hypertension Mother   . Deep vein thrombosis Mother   . Heart attack Mother   . Kidney disease Father   . Alcohol abuse Brother   . Heart disease Brother   . Cancer Brother        Lung cancer  . Cancer Brother        kidney cancer  . Varicose Veins Sister   . Heart disease Sister        After age 64  . Heart attack Sister   . Heart disease Sister        After age 27  . Other Other        epilepsy  . Hypertension Other   . Cancer Paternal Grandmother        breast cancer   History  Sexual Activity  . Sexual activity: No    Outpatient  Encounter Prescriptions as of 08/01/2017  Medication Sig  . aspirin EC 81 MG tablet Take 81 mg by mouth daily.    . dorzolamide (TRUSOPT) 2 % ophthalmic solution PLACE 1 DROP INTO BOTH EYES 2 (TWO) TIMES DAILY.  Marland Kitchen levothyroxine (SYNTHROID, LEVOTHROID) 50 MCG tablet TAKE 1 TABLET BY MOUTH EVERY DAY  . losartan (COZAAR) 100 MG tablet TAKE 1 TABLET (100 MG TOTAL) BY MOUTH DAILY.  . Multiple Vitamins-Minerals (AIRBORNE GUMMIES) CHEW Chew 1 Piece by mouth daily.  . Multiple Vitamins-Minerals (PRESERVISION AREDS 2) CAPS Take 2 capsules by mouth 2 (two) times daily.  . [DISCONTINUED] levothyroxine (SYNTHROID, LEVOTHROID) 50 MCG tablet TAKE 1 TABLET BY MOUTH EVERY DAY   No facility-administered encounter medications on file as of 08/01/2017.     Activities of Daily Living In your present state of health, do you have any difficulty performing the following activities: 08/01/2017  Hearing?  Y  Comment L ear hearing aid  Vision? N  Difficulty concentrating or making decisions? N  Walking or climbing stairs? N  Dressing or bathing? N  Doing errands, shopping? N  Preparing Food and eating ? N  Using the Toilet? N  In the past six months, have you accidently leaked urine? N  Do you have problems with loss of bowel control? N  Managing your Medications? N  Managing your Finances? N  Housekeeping or managing your Housekeeping? N  Some recent data might be hidden    Patient Care Team: Crecencio Mc, MD as PCP - General (Internal Medicine)    Assessment:    This is a routine wellness examination for [name]. The goal of the wellness visit is to assist the patient how to close the gaps in care and create a preventative care plan for the patient.   The roster of all physicians providing medical care to patient is listed in the Snapshot section of the chart.  Osteoporosis reviewed.    Safety issues reviewed; lives alone. Life alert, smoke and carbon monoxide detectors in the home. No firearms  in the home.  Wears seatbelts when driving or riding with others. Patient does wear sunscreen or protective clothing when in direct sunlight. No violence in the home.  Patient is alert, normal appearance, oriented to person/place/and time. Correctly identified the president of the [place], recall of 2/3 words, and performing simple calculations. Displays appropriate judgement and can read correct time from watch face.   No new identified risk were noted.  No failures at ADL's or IADL's.    BMI- discussed the importance of a healthy diet, water intake and the benefits of aerobic exercise. Educational material provided.   24 hour diet recall: Vegetable diet  Daily fluid intake: 3 cups of caffeine, 1 cups of water  Dental- every 6 months.  Dr. Jacobo Forest.  Eye- Visual acuity not assessed per patient preference since they have regular follow up with the ophthalmologist.  Wears corrective lenses.  Sleep patterns- Sleeps 8 hours at night.  Wakes feeling rested.  TDAP vaccine deferred per patient preference.  Follow up with insurance.  Educational material provided.  Patient Concerns: None at this time. Follow up with PCP as needed.  Exercise Activities and Dietary recommendations Current Exercise Habits: Home exercise routine, Type of exercise: walking (active in her yard/garden), Time (Minutes): 20, Frequency (Times/Week): 4, Weekly Exercise (Minutes/Week): 80, Intensity: Mild  Goals    . Increase water intake          Stay hydrated. Finish entire cup of water when taking medications.      Fall Risk Fall Risk  08/01/2017 03/01/2016 05/28/2014 02/20/2014  Falls in the past year? No No Yes Yes  Number falls in past yr: - - 1 1  Injury with Fall? - - Yes Yes  Risk Factor Category  - - - High Fall Risk  Risk for fall due to : - - - History of fall(s)   Depression Screen PHQ 2/9 Scores 08/01/2017 03/01/2016 05/28/2014 02/20/2014  PHQ - 2 Score 0 0 0 0  PHQ- 9 Score 0 - - -     Cognitive  Function     6CIT Screen 08/01/2017  What Year? 0 points  What month? 0 points  What time? 0 points  Count back from 20 0 points  Months in reverse 0 points  Repeat phrase 0 points  Total Score 0    Immunization History  Administered Date(s)  Administered  . Influenza Split 07/01/2014  . Influenza-Unspecified 07/05/2016, 07/04/2017  . Pneumococcal Conjugate-13 02/20/2014  . Pneumococcal Polysaccharide-23 10/03/2003, 02/22/2015  . Td 10/02/2006   Screening Tests Health Maintenance  Topic Date Due  . TETANUS/TDAP  10/02/2016  . INFLUENZA VACCINE  Completed  . DEXA SCAN  Completed  . PNA vac Low Risk Adult  Completed      Plan:   End of life planning; Advanced aging; Advanced directives discussed.  No HCPOA/Living Will.  Additional information provided to help them start the conversation with family.  Copy of HCPOA/Living Will requested upon completion. Time spent on this topic is 22 minutes.  I have personally reviewed and noted the following in the patient's chart:   . Medical and social history . Use of alcohol, tobacco or illicit drugs  . Current medications and supplements . Functional ability and status . Nutritional status . Physical activity . Advanced directives . List of other physicians . Hospitalizations, surgeries, and ER visits in previous 12 months . Vitals . Screenings to include cognitive, depression, and falls . Referrals and appointments  In addition, I have reviewed and discussed with patient certain preventive protocols, quality metrics, and best practice recommendations. A written personalized care plan for preventive services as well as general preventive health recommendations were provided to patient.     OBrien-Blaney, Alexis Mizuno L, LPN  [id number]    I have reviewed the above information and agree with above.   Deborra Medina, MD

## 2017-08-20 ENCOUNTER — Ambulatory Visit: Payer: Medicare Other | Admitting: Internal Medicine

## 2017-09-05 ENCOUNTER — Ambulatory Visit: Payer: Medicare Other | Admitting: Internal Medicine

## 2017-09-19 ENCOUNTER — Other Ambulatory Visit: Payer: Self-pay

## 2017-09-19 DIAGNOSIS — I6529 Occlusion and stenosis of unspecified carotid artery: Secondary | ICD-10-CM

## 2017-10-16 ENCOUNTER — Encounter: Payer: Self-pay | Admitting: Internal Medicine

## 2017-10-16 ENCOUNTER — Ambulatory Visit: Payer: Medicare Other | Admitting: Internal Medicine

## 2017-10-16 VITALS — BP 166/74 | HR 73 | Temp 98.0°F | Resp 15 | Ht 62.0 in | Wt 117.2 lb

## 2017-10-16 DIAGNOSIS — D649 Anemia, unspecified: Secondary | ICD-10-CM

## 2017-10-16 DIAGNOSIS — I480 Paroxysmal atrial fibrillation: Secondary | ICD-10-CM

## 2017-10-16 DIAGNOSIS — E559 Vitamin D deficiency, unspecified: Secondary | ICD-10-CM

## 2017-10-16 DIAGNOSIS — I10 Essential (primary) hypertension: Secondary | ICD-10-CM

## 2017-10-16 DIAGNOSIS — E034 Atrophy of thyroid (acquired): Secondary | ICD-10-CM | POA: Diagnosis not present

## 2017-10-16 LAB — COMPREHENSIVE METABOLIC PANEL
ALBUMIN: 4 g/dL (ref 3.5–5.2)
ALK PHOS: 60 U/L (ref 39–117)
ALT: 11 U/L (ref 0–35)
AST: 18 U/L (ref 0–37)
BILIRUBIN TOTAL: 0.3 mg/dL (ref 0.2–1.2)
BUN: 17 mg/dL (ref 6–23)
CO2: 29 mEq/L (ref 19–32)
Calcium: 9.1 mg/dL (ref 8.4–10.5)
Chloride: 104 mEq/L (ref 96–112)
Creatinine, Ser: 0.93 mg/dL (ref 0.40–1.20)
GFR: 60.18 mL/min (ref >60.00)
GLUCOSE: 83 mg/dL (ref 70–99)
POTASSIUM: 4.6 meq/L (ref 3.5–5.1)
SODIUM: 139 meq/L (ref 135–145)
TOTAL PROTEIN: 6.3 g/dL (ref 6.0–8.3)

## 2017-10-16 LAB — CBC WITH DIFFERENTIAL/PLATELET
Basophils Absolute: 0 10*3/uL (ref 0.0–0.1)
Basophils Relative: 0.1 % (ref 0.0–3.0)
EOS PCT: 2.2 % (ref 0.0–5.0)
Eosinophils Absolute: 0.1 10*3/uL (ref 0.0–0.7)
HCT: 34.4 % — ABNORMAL LOW (ref 36.0–46.0)
Hemoglobin: 11.5 g/dL — ABNORMAL LOW (ref 12.0–15.0)
LYMPHS ABS: 1.5 10*3/uL (ref 0.7–4.0)
Lymphocytes Relative: 27.1 % (ref 12.0–46.0)
MCHC: 33.3 g/dL (ref 30.0–36.0)
MCV: 100.3 fl — ABNORMAL HIGH (ref 78.0–100.0)
MONO ABS: 0.6 10*3/uL (ref 0.1–1.0)
Monocytes Relative: 11 % (ref 3.0–12.0)
NEUTROS PCT: 59.6 % (ref 43.0–77.0)
Neutro Abs: 3.3 10*3/uL (ref 1.4–7.7)
PLATELETS: 158 10*3/uL (ref 150.0–400.0)
RBC: 3.42 Mil/uL — ABNORMAL LOW (ref 3.87–5.11)
RDW: 12.7 % (ref 11.5–15.5)
WBC: 5.6 10*3/uL (ref 4.0–10.5)

## 2017-10-16 LAB — MICROALBUMIN / CREATININE URINE RATIO
Creatinine,U: 26.2 mg/dL
Microalb Creat Ratio: 2.7 mg/g (ref 0.0–30.0)
Microalb, Ur: <0.7 mg/dL (ref 0.0–1.9)

## 2017-10-16 LAB — VITAMIN D 25 HYDROXY (VIT D DEFICIENCY, FRACTURES): VITD: 55.01 ng/mL (ref 30.00–100.00)

## 2017-10-16 LAB — VITAMIN B12: Vitamin B-12: 814 pg/mL (ref 211–911)

## 2017-10-16 LAB — TSH: TSH: 3.43 u[IU]/mL (ref 0.35–4.50)

## 2017-10-16 MED ORDER — TETANUS-DIPHTH-ACELL PERTUSSIS 5-2.5-18.5 LF-MCG/0.5 IM SUSP: 0.5000 mL | Freq: Once | INTRAMUSCULAR | 0 refills | Status: AC

## 2017-10-16 MED ORDER — ZOSTER VAC RECOMB ADJUVANTED 50 MCG/0.5ML IM SUSR: 0.5000 mL | Freq: Once | INTRAMUSCULAR | 1 refills | Status: AC

## 2017-10-16 MED ORDER — TETANUS-DIPHTH-ACELL PERTUSSIS 5-2.5-18.5 LF-MCG/0.5 IM SUSP: 0.5000 mL | Freq: Once | INTRAMUSCULAR | 0 refills | Status: DC

## 2017-10-16 NOTE — Progress Notes (Signed)
Subjective:  [name], female    DOB: [birth year]  Age: 84 y.o. MRN: [id number]  CC: The primary encounter diagnosis was Anemia, unspecified type. Diagnoses of Hypothyroidism due to acquired atrophy of thyroid, Essential hypertension, benign, Vitamin D deficiency, and Paroxysmal atrial fibrillation ([place]) were also pertinent to this visit.  HPI AMAIA LAVALLIE presents for follow up on hypertension,  Hypothyroidism,  PAF ,  Mild normocytic anemia etc  Hypertension: patient checks blood pressure twice weekly at home.  Readings have been for the most part > 140/80 at rest . Patient is following a reduce salt diet most days and is taking medications as prescribed  Seeing ophthalmology for  Cataracts and glaucoma . No headaches or eye pain .   No falls,   Appetite good ,  bowelsl and bladder ok.  Sleeping well.     Lab Results  Component Value Date   TSH 3.43 10/16/2017   Lab Results  Component Value Date   CREATININE 0.93 10/16/2017   Lab Results  Component Value Date   WBC 5.6 10/16/2017   HGB 11.5 (L) 10/16/2017   HCT 34.4 (L) 10/16/2017   MCV 100.3 (H) 10/16/2017   PLT 158.0 10/16/2017   bp is elevated today     Outpatient Medications Prior to Visit  Medication Sig Dispense Refill  . aspirin EC 81 MG tablet Take 81 mg by mouth daily.      . dorzolamide (TRUSOPT) 2 % ophthalmic solution PLACE 1 DROP INTO BOTH EYES 2 (TWO) TIMES DAILY.  12  . levothyroxine (SYNTHROID, LEVOTHROID) 50 MCG tablet TAKE 1 TABLET BY MOUTH EVERY DAY 90 tablet 3  . losartan (COZAAR) 100 MG tablet TAKE 1 TABLET (100 MG TOTAL) BY MOUTH DAILY. 90 tablet 2  . Multiple Vitamins-Minerals (AIRBORNE GUMMIES) CHEW Chew 1 Piece by mouth daily.    . Multiple Vitamins-Minerals (PRESERVISION AREDS 2) CAPS Take 2 capsules by mouth 2 (two) times daily.     No facility-administered medications prior to visit.     Review of Systems;  Patient denies headache, fevers, malaise, unintentional weight  loss, skin rash, eye pain, sinus congestion and sinus pain, sore throat, dysphagia,  hemoptysis , cough, dyspnea, wheezing, chest pain, palpitations, orthopnea, edema, abdominal pain, nausea, melena, diarrhea, constipation, flank pain, dysuria, hematuria, urinary  Frequency, nocturia, numbness, tingling, seizures,  Focal weakness, Loss of consciousness,  Tremor, insomnia, depression, anxiety, and suicidal ideation.      Objective:  BP (!) 166/74 (BP Location: Left Arm, Patient Position: Sitting, Cuff Size: Normal)   Pulse 73   Temp 98 F (36.7 C) (Oral)   Resp 15   Ht 5\' 2"  (1.575 m)   Wt 117 lb 3.2 oz (53.2 kg)   SpO2 97%   BMI 21.44 kg/m   BP Readings from Last 3 Encounters:  10/16/17 (!) 166/74  08/01/17 (!) 160/72  02/28/17 (!) 162/70    Wt Readings from Last 3 Encounters:  10/16/17 117 lb 3.2 oz (53.2 kg)  08/01/17 119 lb 1.9 oz (54 kg)  02/28/17 117 lb 12.8 oz (53.4 kg)    General appearance: alert, cooperative and appears stated age Ears: normal TM's and external ear canals both ears Throat: lips, mucosa, and tongue normal; teeth and gums normal Neck: no adenopathy, no carotid bruit, supple, symmetrical, trachea midline and thyroid not enlarged, symmetric, no tenderness/mass/nodules Back: symmetric, no curvature. ROM normal. No CVA tenderness. Lungs: clear to auscultation bilaterally Heart: regular rate and rhythm, S1, S2  normal, no murmur, click, rub or gallop Abdomen: soft, non-tender; bowel sounds normal; no masses,  no organomegaly Pulses: 2+ and symmetric Skin: Skin color, texture, turgor normal. No rashes or lesions Lymph nodes: Cervical, supraclavicular, and axillary nodes normal.  No results found for: HGBA1C  Lab Results  Component Value Date   CREATININE 0.93 10/16/2017   CREATININE 1.01 02/28/2017   CREATININE 1.02 08/31/2016    Lab Results  Component Value Date   WBC 5.6 10/16/2017   HGB 11.5 (L) 10/16/2017   HCT 34.4 (L) 10/16/2017   PLT 158.0  10/16/2017   GLUCOSE 83 10/16/2017   CHOL 178 03/01/2016   TRIG 73.0 03/01/2016   HDL 64.20 03/01/2016   LDLDIRECT 76.0 08/31/2016   LDLCALC 100 (H) 03/01/2016   ALT 11 10/16/2017   AST 18 10/16/2017   NA 139 10/16/2017   K 4.6 10/16/2017   CL 104 10/16/2017   CREATININE 0.93 10/16/2017   BUN 17 10/16/2017   CO2 29 10/16/2017   TSH 3.43 10/16/2017   MICROALBUR <0.7 10/16/2017    No results found.  Assessment & Plan:   Problem List Items Addressed This Visit    Anemia - Primary   Relevant Orders   CBC with Differential/Platelet (Completed)   Vitamin B12 (Completed)   Essential hypertension, benign    She reports compliance with medication regimen  but has an elevated reading today in office.  He has been asked to check her home machine against the readings at her vascular appointment for calibration . Renal function will be checked today  Lab Results  Component Value Date   CREATININE 0.93 10/16/2017   Lab Results  Component Value Date   NA 139 10/16/2017   K 4.6 10/16/2017   CL 104 10/16/2017   CO2 29 10/16/2017         Relevant Orders   Microalbumin / creatinine urine ratio (Completed)   Comprehensive metabolic panel (Completed)   Hypothyroidism    Thyroid function is WNL on current dose.  No current changes needed.  Lab Results  Component Value Date   TSH 3.43 10/16/2017         Relevant Orders   TSH (Completed)   Paroxysmal atrial fibrillation ([facility])    She did not tolerate a beta blockade trial by dr Nehemiah Massed after comprehensive noninvasive workup showed nonvalvular atrial fibrillation . Anticoagulation is contraindicated due to history of falls.        Other Visit Diagnoses    Vitamin D deficiency       Relevant Orders   VITAMIN D 25 Hydroxy (Vit-D Deficiency, Fractures) (Completed)      I have discontinued Van Clines. Merkel's Tdap. I am also having her start on Zoster Vaccine Adjuvanted and Tdap. Additionally, I am having her maintain her  aspirin EC, PRESERVISION AREDS 2, dorzolamide, levothyroxine, losartan, and AIRBORNE GUMMIES.  Meds ordered this encounter  Medications  . DISCONTD: Tdap (BOOSTRIX) 5-2.5-18.5 LF-MCG/0.5 injection    Sig: Inject 0.5 mLs into the muscle once for 1 dose.    Dispense:  0.5 mL    Refill:  0  . Zoster Vaccine Adjuvanted [facility]) injection    Sig: Inject 0.5 mLs into the muscle once for 1 dose.    Dispense:  1 each    Refill:  1  . Tdap (BOOSTRIX) 5-2.5-18.5 LF-MCG/0.5 injection    Sig: Inject 0.5 mLs into the muscle once for 1 dose.    Dispense:  0.5 mL    Refill:  0   A total of 25 minutes of face to face time was spent with patient more than half of which was spent in counselling and coordination of care   Medications Discontinued During This Encounter  Medication Reason  . Tdap ([place]) 5-2.5-18.5 LF-MCG/0.5 injection     Follow-up: Return in about 6 months (around 04/15/2018).   Crecencio Mc, MD

## 2017-10-16 NOTE — Patient Instructions (Signed)
I am concerned about your blood pressure being too high  Please take your home machine with you to your vascular appointment and have them check it   A normal reading is < 140/80    You are due for your tetanus-diptheria-pertussis vaccine (TDaP) but you can get it for less $$$ at a local pharmacy with the script I have provided you.    The ShingRx vaccine is now available in local pharmacies and is much more protective thant Zostavaxs,  It is therefore ADVISED for all  Adults who have a history of chicken  pox,  to prevent shingles

## 2017-10-17 NOTE — Assessment & Plan Note (Signed)
She did not tolerate a beta blockade trial by dr Nehemiah Massed after comprehensive noninvasive workup showed nonvalvular atrial fibrillation . Anticoagulation is contraindicated due to history of falls.

## 2017-10-17 NOTE — Assessment & Plan Note (Signed)
She reports compliance with medication regimen  but has an elevated reading today in office.  He has been asked to check her home machine against the readings at her vascular appointment for calibration . Renal function will be checked today  Lab Results  Component Value Date   CREATININE 0.93 10/16/2017   Lab Results  Component Value Date   NA 139 10/16/2017   K 4.6 10/16/2017   CL 104 10/16/2017   CO2 29 10/16/2017

## 2017-10-17 NOTE — Assessment & Plan Note (Signed)
Thyroid function is WNL on current dose.  No current changes needed.  Lab Results  Component Value Date   TSH 3.43 10/16/2017

## 2017-10-19 ENCOUNTER — Encounter: Payer: Self-pay | Admitting: Family

## 2017-10-19 ENCOUNTER — Encounter: Payer: Self-pay | Admitting: *Deleted

## 2017-10-19 ENCOUNTER — Other Ambulatory Visit: Payer: Self-pay

## 2017-10-19 ENCOUNTER — Ambulatory Visit (HOSPITAL_COMMUNITY)
Admission: RE | Admit: 2017-10-19 | Discharge: 2017-10-19 | Disposition: A | Discharge status: Home / Self Care | Payer: Medicare Other | Source: Ambulatory Visit | Attending: Family | Admitting: Family

## 2017-10-19 ENCOUNTER — Ambulatory Visit (INDEPENDENT_AMBULATORY_CARE_PROVIDER_SITE_OTHER): Payer: Medicare Other | Admitting: Family

## 2017-10-19 VITALS — BP 185/77 | HR 70 | Temp 97.5°F | Resp 14 | Ht 62.0 in | Wt 114.0 lb

## 2017-10-19 DIAGNOSIS — I6523 Occlusion and stenosis of bilateral carotid arteries: Secondary | ICD-10-CM

## 2017-10-19 DIAGNOSIS — I6529 Occlusion and stenosis of unspecified carotid artery: Secondary | ICD-10-CM | POA: Diagnosis present

## 2017-10-19 NOTE — Progress Notes (Signed)
Chief Complaint: Follow up Extracranial Carotid Artery Stenosis   History of Present Illness  Deanna White is a 84 y.o. female whom Dr. Bridgett Larsson has been monitoring for minimal carotid artery stenosis.  Previous carotid studies demonstrated: RICA <24% stenosis, LICA <23% stenosis.   The patient denies any history of TIA or stroke symptoms. Specifically she denies a history of amaurosis fugax or monocular blindness, unilateral facial drooping, hemiparesis, or receptive or expressive aphasia.   She denies tingling, numbness, pain, or weakness in either upper extremity, but both hands have always felt cold.  She denies headache, denies dizziness, denies chest pain or dyspnea.  Pt states her cardiologist, Dr. Serafina Royals with [facility], told her she has intermittent atrial fib, she wore a monitor for a while; states she feels unsteady when she has atrial fib, which last only 1-2 seconds.   She is a vegetarian since about 2003, does her own yard work, grows her own food as much as possible.  Patient has not had previous carotid artery intervention.  Pt denies claudication symptoms in legs with walking, denies non healing wounds.  She had cataract extraction left eye with IOL.  Pt Diabetic: No Pt smoker: non-smoker  Pt meds include: Statin : No, states her cholesterol is good ASA: Yes Other anticoagulants/antiplatelets: no  Past Medical History:  Diagnosis Date  . Allergy   . Carotid artery occlusion   . Colon polyps   . Depression   . Fall from slipping on ice Feb. 2015  . Heart murmur   . Hypertension   . Menopause   . Pneumonia   . Positive TB test   . Sore throat   . Thyroid disease   . Varicose veins     Social History Social History   Tobacco Use  . Smoking status: Never Smoker  . Smokeless tobacco: Never Used  Substance Use Topics  . Alcohol use: No  . Drug use: No    Family History Family History  Problem Relation Age of Onset  .  Heart disease Mother        After age 64  . Hypertension Mother   . Deep vein thrombosis Mother   . Heart attack Mother   . Kidney disease Father   . Alcohol abuse Brother   . Heart disease Brother   . Cancer Brother        Lung cancer  . Cancer Brother        kidney cancer  . Varicose Veins Sister   . Heart disease Sister        After age 65  . Heart attack Sister   . Heart disease Sister        After age 28  . Other Other        epilepsy  . Hypertension Other   . Cancer Paternal Grandmother        breast cancer    Surgical History Past Surgical History:  Procedure Laterality Date  . ABDOMINAL HYSTERECTOMY    . APPENDECTOMY    . BILATERAL SALPINGOOPHORECTOMY  1964  . CESAREAN SECTION    . EYE SURGERY Right March 2016   Cataract  . TONSILLECTOMY      Allergies  Allergen Reactions  . Brimonidine Other (See Comments)    Pt felt as if throat was closing up, pt also had rash  . Adhesive [Tape] Rash    Peels skin    Current Outpatient Medications  Medication Sig Dispense Refill  .  aspirin EC 81 MG tablet Take 81 mg by mouth daily.      . dorzolamide (TRUSOPT) 2 % ophthalmic solution PLACE 1 DROP INTO BOTH EYES 2 (TWO) TIMES DAILY.  12  . levothyroxine (SYNTHROID, LEVOTHROID) 50 MCG tablet TAKE 1 TABLET BY MOUTH EVERY DAY 90 tablet 3  . losartan (COZAAR) 100 MG tablet TAKE 1 TABLET (100 MG TOTAL) BY MOUTH DAILY. 90 tablet 2  . Multiple Vitamins-Minerals (AIRBORNE GUMMIES) CHEW Chew 1 Piece by mouth daily.    . Multiple Vitamins-Minerals (PRESERVISION AREDS 2) CAPS Take 2 capsules by mouth 2 (two) times daily.     No current facility-administered medications for this visit.     Review of Systems : See HPI for pertinent positives and negatives.  Physical Examination  Vitals:   10/19/17 1130 10/19/17 1144 10/19/17 1145  BP: (!) 188/77 (!) 186/82 (!) 185/77  Pulse: 70 70 70  Resp: 14    Temp: (!) 97.5 F (36.4 C)    TempSrc: Oral    SpO2: 98%    Weight: 114  lb (51.7 kg)    Height: 5\' 2"  (1.575 m)     Body mass index is 20.85 kg/m.  General: A&O x 3, WD   HENT: No gross abnormalities   Eyes: PERRLA  Pulmonary: Sym exp, good air movt, CTAB, no rales, rhonchi, or wheezing  Cardiac: Regular rhythm and rate, + murmur  Vascular: Vessel Right Left  Radial Palpable Palpable  Carotid Palpable, with bruit Palpable, with bruit  Aorta Not palpable N/A  Femoral Palpable Palpable  Popliteal Not palpable Not palpable  PT Not Palpable Not Palpable  DP  Palpable Palpable   Gastrointestinal: soft, NTND, -G/R, - HSM, - palpable masses, - CVAT B  Musculoskeletal: M/S 5/5 throughout , Extremities without ischemic changes   Skin: No rash, no cellulitis, no ulcers noted.   Neurologic: CN 2-12 intact , Pain and light touch intact in extremities , Motor exam as listed above  Psychiatric: Normal thought content, mood appropriate to clinical situation.       Assessment: Deanna White is a 84 y.o. female who has no hx of stroke or TIA. She appears younger and healthier than her stated age.  I advised pt to see her PCP or cardiologist as soon as possible re her elevated blood pressure; however, she uses and automated wrist sphygmomanometer at home, states her systolic blood pressure this morning was 213, was 086 systolic with her device in our office.   Her internal carotid artery stenosis has remained minimal for several years. She is currently age 16 and very healthy for her age, I offered her and her daughter the option to not return for any more carotid duplex monitoring if she chooses.    DATA Carotid Duplex (10/19/17): Bilateral ICA with 1-39% stenosis. Bilateral vertebral artery flow is antegrade.  No significant change compared to exams on 10/09/14 and 10-15-15.   Plan: Follow-up in 2 years with Carotid Duplex scan, at pt request, as she had been on a 2 year surveillance schedule.      I discussed in depth with the patient the nature of atherosclerosis, and emphasized the importance of maximal medical management including strict control of blood pressure, blood glucose, and lipid levels, obtaining regular exercise, and continued cessation of smoking.  The patient is aware that without maximal medical management the underlying atherosclerotic disease process will progress, limiting the benefit of any interventions. The patient was given information about stroke prevention and what  symptoms should prompt the patient to seek immediate medical care. Thank you for allowing [place] to participate in this patient's care.  Clemon Chambers, RN, MSN, FNP-C [facility] Office: [phone]  Clinic Physician: Bridgett Larsson  10/19/17 11:52 AM

## 2017-10-19 NOTE — Progress Notes (Signed)
Vitals:   10/19/17 1130 10/19/17 1144  BP: (!) 188/77 (!) 186/82  Pulse: 70 70  Resp: 14   Temp: (!) 97.5 F (36.4 C)   TempSrc: Oral   SpO2: 98%   Weight: 114 lb (51.7 kg)   Height: 5\' 2"  (1.575 m)

## 2017-10-19 NOTE — Patient Instructions (Signed)

## 2017-11-06 ENCOUNTER — Ambulatory Visit: Payer: Self-pay | Admitting: *Deleted

## 2017-11-06 MED ORDER — AMLODIPINE BESYLATE 2.5 MG PO TABS: 2.5000 mg | ORAL_TABLET | Freq: Every day | ORAL | 3 refills | Status: DC

## 2017-11-06 NOTE — Telephone Encounter (Signed)
[facility] regarding Ms. Nofsinger's b/p being elevated. She was 197/80 and 185/80. She denied having any cardiac symptoms other then feeling a little weak.  She was advised to go to UC to be evaluated and will schedule an appointment for her to be evaluated at the her primary. I will give her a call back and give her the appointment time. Home care advice given to patient with verbal understanding.  Reason for Disposition . Systolic BP  >= 696 OR Diastolic >= 789  Answer Assessment - Initial Assessment Questions 1. BLOOD PRESSURE: "What is the blood pressure?" "Did you take at least two measurements 5 minutes apart?"     1805/80 and 197/80 2. ONSET: "When did you take your blood pressure?"     now 3. HOW: "How did you obtain the blood pressure?" (e.g., visiting nurse, automatic home BP monitor)     [facility] 4. HISTORY: "Do you have a history of high blood pressure?"     yes 5. MEDICATIONS: "Are you taking any medications for blood pressure?" "Have you missed any doses recently?"     Yes, not missed any doses 6. OTHER SYMPTOMS: "Do you have any symptoms?" (e.g., headache, chest pain, blurred vision, difficulty breathing, weakness)     Little weakness 7. PREGNANCY: "Is there any chance you are pregnant?" "When was your last menstrual period?"     no  Protocols used: HIGH BLOOD PRESSURE-A-AH

## 2017-11-06 NOTE — Telephone Encounter (Signed)
Calling in amlodipine 2.5 mg take a dose tonight.

## 2017-11-06 NOTE — Telephone Encounter (Signed)
Left voicemail on home phone advising script called in. Also left voicemail on cell phone advising script called in.

## 2017-11-06 NOTE — Telephone Encounter (Signed)
fyi

## 2017-11-07 ENCOUNTER — Other Ambulatory Visit: Payer: Self-pay

## 2017-11-07 ENCOUNTER — Ambulatory Visit: Payer: Medicare Other | Admitting: Family Medicine

## 2017-11-07 ENCOUNTER — Encounter: Payer: Self-pay | Admitting: Family Medicine

## 2017-11-07 ENCOUNTER — Telehealth: Payer: Self-pay | Admitting: Internal Medicine

## 2017-11-07 VITALS — BP 214/90 | HR 74 | Temp 97.8°F | Wt 117.2 lb

## 2017-11-07 DIAGNOSIS — I10 Essential (primary) hypertension: Secondary | ICD-10-CM

## 2017-11-07 LAB — COMPREHENSIVE METABOLIC PANEL
ALT: 12 U/L (ref 0–35)
AST: 22 U/L (ref 0–37)
Albumin: 4 g/dL (ref 3.5–5.2)
Alkaline Phosphatase: 56 U/L (ref 39–117)
BUN: 22 mg/dL (ref 6–23)
CALCIUM: 9.1 mg/dL (ref 8.4–10.5)
CHLORIDE: 106 meq/L (ref 96–112)
CO2: 27 mEq/L (ref 19–32)
CREATININE: 0.96 mg/dL (ref 0.40–1.20)
GFR: 58.01 mL/min — ABNORMAL LOW (ref >60.00)
Glucose, Bld: 94 mg/dL (ref 70–99)
Potassium: 4.3 mEq/L (ref 3.5–5.1)
SODIUM: 141 meq/L (ref 135–145)
Total Bilirubin: 0.4 mg/dL (ref 0.2–1.2)
Total Protein: 6.3 g/dL (ref 6.0–8.3)

## 2017-11-07 LAB — CBC
HCT: 33.7 % — ABNORMAL LOW (ref 36.0–46.0)
Hemoglobin: 11.3 g/dL — ABNORMAL LOW (ref 12.0–15.0)
MCHC: 33.4 g/dL (ref 30.0–36.0)
MCV: 99.7 fl (ref 78.0–100.0)
Platelets: 144 10*3/uL — ABNORMAL LOW (ref 150.0–400.0)
RBC: 3.38 Mil/uL — AB (ref 3.87–5.11)
RDW: 13.1 % (ref 11.5–15.5)
WBC: 5.8 10*3/uL (ref 4.0–10.5)

## 2017-11-07 LAB — TSH: TSH: 5.26 u[IU]/mL — AB (ref 0.35–4.50)

## 2017-11-07 MED ORDER — AMLODIPINE BESYLATE 2.5 MG PO TABS: 2.5000 mg | ORAL_TABLET | Freq: Every day | ORAL | 3 refills | Status: DC

## 2017-11-07 MED ORDER — LOSARTAN POTASSIUM 100 MG PO TABS: 100.0000 mg | ORAL_TABLET | Freq: Every day | ORAL | 2 refills | Status: DC

## 2017-11-07 NOTE — Telephone Encounter (Signed)
Contacted [facility] to verify if prescription had been received for Norvasc per Dr Caryl Bis; spoke with Thurmond Butts and he states that they have not yet received prescription; will resent prescription; attempted to contact pt at [phone] but no answer.

## 2017-11-07 NOTE — Telephone Encounter (Signed)
Spoke to [place] at pharmacy and patient has already picked up script.Deanna White

## 2017-11-07 NOTE — Assessment & Plan Note (Signed)
Blood pressure noted to be more elevated recently.  Elevated today into hypertensive urgency range likely related to not taking her losartan.  She is asymptomatic.  She has a benign exam.  Given benign exam and lack of symptoms we will treat as an outpatient.  We will check lab work as outlined below.  We will start her on amlodipine 2.5 mg daily as previously advised by her PCP.  She will continue her losartan and take both of these today.  She will return in 1 week for BP check with nursing.  She will monitor her blood pressure with a new cuff at home given that hers appears to be inaccurate when compared to ours today as it read 144/99.  Discussed that if she were to develop any symptoms she would need to go to the emergency department.  She voiced understanding.

## 2017-11-07 NOTE — Patient Instructions (Addendum)
Nice to see you. We will place you on the amlodipine.  I sent this to your pharmacy again.  Please continue your losartan.  We will check lab work today. If you develop any numbness, focal weakness, vision changes, chest pain, shortness of breath, worsening headache, abdominal pain, or any new or changing symptoms please seek medical attention immediately.

## 2017-11-07 NOTE — Telephone Encounter (Signed)
It appears that the prescription was checked off as phone in.  I have changed this and sent this to her pharmacy.  Please contact the pharmacy and the patient to let them know that this was recent.  Thanks.

## 2017-11-07 NOTE — Telephone Encounter (Signed)
Copied from [place] [phone]. Topic: Quick Communication - See Telephone Encounter >> Nov 07, 2017 11:48 AM Ivar Drape wrote: CRM for notification. See Telephone encounter for:  11/07/17. Patient is at the [facility].  The pharmacist said they have received the losartan medication but they have not received the amLODipine (NORVASC) 2.5 MG tablet medication.  Please send over.

## 2017-11-07 NOTE — Progress Notes (Signed)
Tommi Rumps, MD Phone: [phone]  Deanna White is a 84 y.o. female who presents today for same-day visit.  Patient notes recently her blood pressure has been more elevated when checked not at home.  Notes it has been running up into the 098J-191Y systolically over 78G.  She notes she has had a very mild headache with this.  Overall feels a little weak though no focal weakness.  No numbness.  No vision changes.  No chest pain or shortness of breath.  No abdominal pain. She notes she has been taking her losartan though did not take it today as she did not have it when she went to spend the night at her sister's house last night.  Social History   Tobacco Use  Smoking Status Never Smoker  Smokeless Tobacco Never Used     ROS see history of present illness  Objective  Physical Exam Vitals:   11/07/17 1028  BP: (!) 214/90  Pulse: 74  Temp: 97.8 F (36.6 C)  SpO2: 98%  Blood pressure rechecked on several occasions and was similar  BP Readings from Last 3 Encounters:  11/07/17 (!) 214/90  10/19/17 (!) 185/77  10/16/17 (!) 166/74   Wt Readings from Last 3 Encounters:  11/07/17 117 lb 3.2 oz (53.2 kg)  10/19/17 114 lb (51.7 kg)  10/16/17 117 lb 3.2 oz (53.2 kg)    Physical Exam  Constitutional: No distress.  Eyes: Conjunctivae are normal. Pupils are equal, round, and reactive to light.  Fundus exam attempted though difficult with cataract in right eye and pupillary constriction and left  Cardiovascular: Normal rate, regular rhythm and normal heart sounds.  Pulmonary/Chest: Effort normal and breath sounds normal.  Abdominal: Soft. Bowel sounds are normal. She exhibits no distension. There is no tenderness.  Musculoskeletal: She exhibits no edema.  Neurological: She is alert. Gait normal.  Skin: Skin is warm and dry. She is not diaphoretic.     Assessment/Plan: Please see individual problem list.  Essential hypertension, benign Blood pressure noted to be more  elevated recently.  Elevated today into hypertensive urgency range likely related to not taking her losartan.  She is asymptomatic.  She has a benign exam.  Given benign exam and lack of symptoms we will treat as an outpatient.  We will check lab work as outlined below.  We will start her on amlodipine 2.5 mg daily as previously advised by her PCP.  She will continue her losartan and take both of these today.  She will return in 1 week for BP check with nursing.  She will monitor her blood pressure with a new cuff at home given that hers appears to be inaccurate when compared to ours today as it read 144/99.  Discussed that if she were to develop any symptoms she would need to go to the emergency department.  She voiced understanding.   Orders Placed This Encounter  Procedures  . Comp Met (CMET)  . CBC  . TSH    Meds ordered this encounter  Medications  . DISCONTD: amLODipine (NORVASC) 2.5 MG tablet    Sig: Take 1 tablet (2.5 mg total) by mouth daily.    Dispense:  90 tablet    Refill:  3  . losartan (COZAAR) 100 MG tablet    Sig: Take 1 tablet (100 mg total) by mouth daily.    Dispense:  90 tablet    Refill:  2  . amLODipine (NORVASC) 2.5 MG tablet    Sig: Take 1  tablet (2.5 mg total) by mouth daily.    Dispense:  90 tablet    Refill:  [place], MD [place]

## 2017-11-11 ENCOUNTER — Emergency Department
Admission: EM | Admit: 2017-11-11 | Discharge: 2017-11-11 | Disposition: A | Discharge status: Home / Self Care | Payer: Medicare Other | Attending: Emergency Medicine | Admitting: Emergency Medicine

## 2017-11-11 ENCOUNTER — Other Ambulatory Visit: Payer: Self-pay

## 2017-11-11 ENCOUNTER — Emergency Department: Payer: Medicare Other

## 2017-11-11 DIAGNOSIS — Z79899 Other long term (current) drug therapy: Secondary | ICD-10-CM | POA: Insufficient documentation

## 2017-11-11 DIAGNOSIS — I10 Essential (primary) hypertension: Secondary | ICD-10-CM | POA: Insufficient documentation

## 2017-11-11 DIAGNOSIS — E039 Hypothyroidism, unspecified: Secondary | ICD-10-CM | POA: Diagnosis not present

## 2017-11-11 DIAGNOSIS — Z7982 Long term (current) use of aspirin: Secondary | ICD-10-CM | POA: Diagnosis not present

## 2017-11-11 DIAGNOSIS — R200 Anesthesia of skin: Secondary | ICD-10-CM | POA: Insufficient documentation

## 2017-11-11 LAB — COMPREHENSIVE METABOLIC PANEL
ALK PHOS: 61 U/L (ref 38–126)
ALT: 14 U/L (ref 14–54)
ANION GAP: 11 (ref 5–15)
AST: 27 U/L (ref 15–41)
Albumin: 4.1 g/dL (ref 3.5–5.0)
BUN: 22 mg/dL — ABNORMAL HIGH (ref 6–20)
CALCIUM: 9.3 mg/dL (ref 8.9–10.3)
CHLORIDE: 105 mmol/L (ref 101–111)
CO2: 23 mmol/L (ref 22–32)
Creatinine, Ser: 0.93 mg/dL (ref 0.44–1.00)
GFR calc non Af Amer: 53 mL/min — ABNORMAL LOW (ref >60)
Glucose, Bld: 102 mg/dL — ABNORMAL HIGH (ref 65–99)
Potassium: 3.9 mmol/L (ref 3.5–5.1)
Sodium: 139 mmol/L (ref 135–145)
Total Bilirubin: 0.4 mg/dL (ref 0.3–1.2)
Total Protein: 6.2 g/dL — ABNORMAL LOW (ref 6.5–8.1)

## 2017-11-11 LAB — CBC WITH DIFFERENTIAL/PLATELET
Basophils Absolute: 0 10*3/uL (ref 0–0.1)
Basophils Relative: 0 %
EOS ABS: 0.3 10*3/uL (ref 0–0.7)
EOS PCT: 5 %
HCT: 36.7 % (ref 35.0–47.0)
Hemoglobin: 12.2 g/dL (ref 12.0–16.0)
LYMPHS ABS: 1.7 10*3/uL (ref 1.0–3.6)
Lymphocytes Relative: 29 %
MCH: 32.8 pg (ref 26.0–34.0)
MCHC: 33.2 g/dL (ref 32.0–36.0)
MCV: 98.8 fL (ref 80.0–100.0)
MONOS PCT: 14 %
Monocytes Absolute: 0.8 10*3/uL (ref 0.2–0.9)
Neutro Abs: 3 10*3/uL (ref 1.4–6.5)
Neutrophils Relative %: 52 %
PLATELETS: 158 10*3/uL (ref 150–440)
RBC: 3.71 MIL/uL — ABNORMAL LOW (ref 3.80–5.20)
RDW: 13.2 % (ref 11.5–14.5)
WBC: 5.7 10*3/uL (ref 3.6–11.0)

## 2017-11-11 LAB — TROPONIN I

## 2017-11-11 MED ORDER — AMLODIPINE BESYLATE 5 MG PO TABS: 5.0000 mg | ORAL_TABLET | Freq: Every day | ORAL | 1 refills | Status: DC

## 2017-11-11 MED ORDER — HYDRALAZINE HCL 20 MG/ML IJ SOLN
5.0000 mg | Freq: Once | INTRAMUSCULAR | Status: AC
  Administered 2017-11-11: 5 mg via INTRAVENOUS
  Filled 2017-11-11: qty 1

## 2017-11-11 MED ORDER — AMLODIPINE BESYLATE 5 MG PO TABS
2.5000 mg | ORAL_TABLET | Freq: Once | ORAL | Status: AC
Start: 2017-11-11 — End: 2017-11-11
  Administered 2017-11-11: 2.5 mg via ORAL
  Filled 2017-11-11: qty 1

## 2017-11-11 NOTE — ED Triage Notes (Signed)
Pt states is here with htn. Pt states blood pressure at home of 220/?. Pt denies pain. Pt denies dizziness, pain, shob.

## 2017-11-11 NOTE — ED Notes (Signed)
Pt back from ct and bp updated. Pt has no complaints and is resting comfortably in bed. Sister at bedside.

## 2017-11-11 NOTE — ED Provider Notes (Signed)
[facility] Emergency Department Provider Note    ____________________________________________   I have reviewed the triage vital signs and the nursing notes.   HISTORY  Chief Complaint Hypertension   History limited by: Not Limited   HPI Deanna White is a 84 y.o. female who presents to the emergency department today with primary concern for high blood pressure.  Patient states she does have a history of hypertension and has been on medication for this for a long time.  However 5 days ago when she was in a pharmacy she checked it and it was found to be quite elevated.  She then went and saw her primary care doctor 4 days ago her blood pressure was greater than 200.  They did add on Norvasc and she has been taking Norvasc for the past couple of days.  She is continued to feel somewhat off.  She describes it is sensitive head discomfort although not pain.  She also has had intermittent numbness around her lips.  She denies any chest pain.     Per medical record review patient has a history of HTN  Past Medical History:  Diagnosis Date  . Allergy   . Carotid artery occlusion   . Colon polyps   . Depression   . Fall from slipping on ice Feb. 2015  . Heart murmur   . Hypertension   . Menopause   . Pneumonia   . Positive TB test   . Sore throat   . Thyroid disease   . Varicose veins     Patient Active Problem List   Diagnosis Date Noted  . Constipation 03/03/2017  . Flatulence 09/03/2016  . Paroxysmal atrial fibrillation ([place]) 09/03/2016  . Medicare annual wellness visit, subsequent 03/03/2016  . Encounter for preventive health examination 03/03/2016  . Anemia 02/25/2015  . Patient underweight 02/23/2015  . Hypothyroidism 02/23/2015  . Postmenopausal osteoporosis 02/22/2014  . Osteoporosis of vertebra 02/22/2014  . Essential hypertension, benign 02/20/2014  . Peripheral vascular disease ([place]) 09/15/2011    Past Surgical History:  Procedure  Laterality Date  . ABDOMINAL HYSTERECTOMY    . APPENDECTOMY    . BILATERAL SALPINGOOPHORECTOMY  1964  . CESAREAN SECTION    . EYE SURGERY Right March 2016   Cataract  . TONSILLECTOMY      Prior to Admission medications   Medication Sig Start Date End Date Taking? Authorizing Provider  amLODipine (NORVASC) 2.5 MG tablet Take 1 tablet (2.5 mg total) by mouth daily. 11/07/17   Leone Haven, MD  aspirin EC 81 MG tablet Take 81 mg by mouth daily.      [provider]  dorzolamide (TRUSOPT) 2 % ophthalmic solution PLACE 1 DROP INTO BOTH EYES 2 (TWO) TIMES DAILY. 07/18/16   [provider]  levothyroxine (SYNTHROID, LEVOTHROID) 50 MCG tablet TAKE 1 TABLET BY MOUTH EVERY DAY 04/02/17   Crecencio Mc, MD  losartan (COZAAR) 100 MG tablet Take 1 tablet (100 mg total) by mouth daily. 11/07/17   Leone Haven, MD  Multiple Vitamins-Minerals (AIRBORNE GUMMIES) CHEW Chew 1 Piece by mouth daily.    [provider]  Multiple Vitamins-Minerals (PRESERVISION AREDS 2) CAPS Take 2 capsules by mouth 2 (two) times daily.    [provider]    Allergies Brimonidine and Adhesive [tape]  Family History  Problem Relation Age of Onset  . Heart disease Mother        After age 56  . Hypertension Mother   .  Deep vein thrombosis Mother   . Heart attack Mother   . Kidney disease Father   . Alcohol abuse Brother   . Heart disease Brother   . Cancer Brother        Lung cancer  . Cancer Brother        kidney cancer  . Varicose Veins Sister   . Heart disease Sister        After age 37  . Heart attack Sister   . Heart disease Sister        After age 14  . Other Other        epilepsy  . Hypertension Other   . Cancer Paternal Grandmother        breast cancer    Social History Social History   Tobacco Use  . Smoking status: Never Smoker  . Smokeless tobacco: Never Used  Substance Use Topics  . Alcohol use: No  . Drug use: No    Review of  Systems Constitutional: No fever/chills Eyes: No visual changes. ENT: No sore throat. Cardiovascular: Denies chest pain. Respiratory: Denies shortness of breath. Gastrointestinal: No abdominal pain.  No nausea, no vomiting.  No diarrhea.  Genitourinary: Negative for dysuria. Musculoskeletal: Negative for back pain. Skin: Negative for rash. Neurological: Positive for lip numbness  ____________________________________________   PHYSICAL EXAM:  VITAL SIGNS: ED Triage Vitals  Enc Vitals Group     BP 11/11/17 2215 (!) 270/104     Pulse Rate 11/11/17 2215 77     Resp 11/11/17 2215 16     Temp --      Temp Source 11/11/17 2215 Oral     SpO2 11/11/17 2215 98 %     Weight 11/11/17 2216 117 lb (53.1 kg)     Height 11/11/17 2216 5\' 2"  (1.575 m)    Constitutional: Alert and oriented. Well appearing and in no distress. Eyes: Conjunctivae are normal.  ENT   Head: Normocephalic and atraumatic.   Nose: No congestion/rhinnorhea.   Mouth/Throat: Mucous membranes are moist.   Neck: No stridor. Hematological/Lymphatic/Immunilogical: No cervical lymphadenopathy. Cardiovascular: Normal rate, regular rhythm.  No murmurs, rubs, or gallops. Respiratory: Normal respiratory effort without tachypnea nor retractions. Breath sounds are clear and equal bilaterally. No wheezes/rales/rhonchi. Gastrointestinal: Soft and non tender. No rebound. No guarding.  Genitourinary: Deferred Musculoskeletal: Normal range of motion in all extremities. No lower extremity edema. Neurologic:  Normal speech and language. No gross focal neurologic deficits are appreciated.  Skin:  Skin is warm, dry and intact. No rash noted. Psychiatric: Mood and affect are normal. Speech and behavior are normal. Patient exhibits appropriate insight and judgment.  ____________________________________________    LABS (pertinent positives/negatives)  Trop <0.03 CMP k 3.9, cr 0.93 CBC wbc 5.7, hgb 12.2, plt  158  ____________________________________________   EKG  I, Nance Pear, attending physician, personally viewed and interpreted this EKG  EKG Time: 2217 Rate: 76 Rhythm: sinus rhythm Axis: normal Intervals: qtc 449 QRS: narrow ST changes: no st elevation Impression: normal ekg   ____________________________________________    RADIOLOGY  CT head No acute disease  ____________________________________________   PROCEDURES  Procedures  ____________________________________________   INITIAL IMPRESSION / ASSESSMENT AND PLAN / ED COURSE  Pertinent labs & imaging results that were available during my care of the patient were reviewed by me and considered in my medical decision making (see chart for details).  Patient presented to the emergency department today because of concerns for high blood pressure.  Patient feels a little  off and has some vague numbness around her lips.  This has come and gone over the past week as they have been trying to manage her high blood pressure.  Concern for endorgan injury however blood work and head CT without concerning findings.  Patient's blood pressure did come down with hydralazine.  Will plan on increasing her dose of Norvasc to 5 mg.  Will give her a extra dose of 2.5 here in the emergency department.  Will have patient follow-up with primary care. Discussed plan with patient.    ____________________________________________   FINAL CLINICAL IMPRESSION(S) / ED DIAGNOSES  Final diagnoses:  Hypertension, unspecified type     Note: This dictation was prepared with Dragon dictation. Any transcriptional errors that result from this process are unintentional     Nance Pear, MD 11/11/17 2309

## 2017-11-11 NOTE — Discharge Instructions (Signed)
Please seek medical attention for any high fevers, chest pain, shortness of breath, change in behavior, persistent vomiting, bloody stool or any other new or concerning symptoms.  

## 2017-11-14 ENCOUNTER — Ambulatory Visit (INDEPENDENT_AMBULATORY_CARE_PROVIDER_SITE_OTHER): Payer: Medicare Other | Admitting: *Deleted

## 2017-11-14 VITALS — BP 202/78 | HR 75 | Resp 16

## 2017-11-14 DIAGNOSIS — I10 Essential (primary) hypertension: Secondary | ICD-10-CM

## 2017-11-14 MED ORDER — AMLODIPINE BESYLATE 10 MG PO TABS: 10.0000 mg | ORAL_TABLET | Freq: Every day | ORAL | 3 refills | Status: DC

## 2017-11-14 NOTE — Progress Notes (Addendum)
Patient in for 1 week follow up  on blood pressure, patient BP taken in left arm 210/84 pulse 78, patient allowed to rest additional 15 minutes BP taken in left arm 202/78 pulse 75. Spoke with PCP regarding patient current BP and patient BP at ED on 11/11/17 and that patient amlodipine was increased to 5 mg daily at ED . PCP advised to increase amlodipine to 10 mg daily and follow up in one week in nurse visit or PCP follow up ? Script sent to pharmacy.   I have reviewed the above information and agree with above.   Deborra Medina, MD

## 2017-11-16 ENCOUNTER — Telehealth: Payer: Self-pay | Admitting: Internal Medicine

## 2017-11-16 NOTE — Telephone Encounter (Signed)
Patient scheduled.

## 2017-11-16 NOTE — Telephone Encounter (Signed)
Patient needs RN follow up 1 week after amlodipine increase to 10 mg

## 2017-11-22 ENCOUNTER — Ambulatory Visit (INDEPENDENT_AMBULATORY_CARE_PROVIDER_SITE_OTHER): Payer: Medicare Other | Admitting: Internal Medicine

## 2017-11-22 ENCOUNTER — Encounter: Payer: Self-pay | Admitting: Internal Medicine

## 2017-11-22 ENCOUNTER — Ambulatory Visit: Payer: Medicare Other

## 2017-11-22 VITALS — BP 138/62 | HR 80 | Temp 98.0°F | Resp 15

## 2017-11-22 DIAGNOSIS — I10 Essential (primary) hypertension: Secondary | ICD-10-CM

## 2017-11-22 DIAGNOSIS — I480 Paroxysmal atrial fibrillation: Secondary | ICD-10-CM

## 2017-11-22 DIAGNOSIS — I4891 Unspecified atrial fibrillation: Secondary | ICD-10-CM

## 2017-11-22 DIAGNOSIS — D649 Anemia, unspecified: Secondary | ICD-10-CM | POA: Diagnosis not present

## 2017-11-22 NOTE — Patient Instructions (Signed)
appt cardiology (Dr Nehemiah Massed) - 10:00 11/23/17 (Friday)

## 2017-11-22 NOTE — Progress Notes (Signed)
[name], female   DOB: 1925-08-13, 84 y.o.   MRN: [id number]   Subjective:    [name], female    DOB: Feb 21, 1925, 84 y.o.   MRN: [id number]  HPI  Patient here as a work in for f/u of her blood pressure.  Pt of Dr Derrel Nip.  She is accompanied by her daughter.  History obtained from both of them.  She came in for a nursing visit.  Was asked about her blood pressure and treatment.  On questioning the pt, she reports she has a history of afib.  Was diagnosed approximately one year ago.  States over the last two weeks, she feels she has noticed more.  Describes a sensation of feeling as if "something is coming over her".  She has to hold on to something.  No actual syncope.  No chest pain.  Did have episode of back pain yesterday.  Unclear if related.  No back pain or chest pain now.  Breathing overall stable.  No nausea or vomiting.  No diarrhea.  No abdominal pain.  Brings in outside blood pressure readings.  Varying blood pressure.  Range between - 140-190/70-80.  In reviewing her pressures in the office recently, they have been elevated.  Recently had amlodipine increased to 10mg  q day.  Taking her medication regularly.     Past Medical History:  Diagnosis Date  . Allergy   . Carotid artery occlusion   . Colon polyps   . Depression   . Fall from slipping on ice Feb. 2015  . Heart murmur   . Hypertension   . Menopause   . Pneumonia   . Positive TB test   . Sore throat   . Thyroid disease   . Varicose veins    Past Surgical History:  Procedure Laterality Date  . ABDOMINAL HYSTERECTOMY    . APPENDECTOMY    . BILATERAL SALPINGOOPHORECTOMY  1964  . CESAREAN SECTION    . EYE SURGERY Right March 2016   Cataract  . TONSILLECTOMY     Family History  Problem Relation Age of Onset  . Heart disease Mother        After age 85  . Hypertension Mother   . Deep vein thrombosis Mother   . Heart attack Mother   . Kidney disease Father   . Alcohol abuse  Brother   . Heart disease Brother   . Cancer Brother        Lung cancer  . Cancer Brother        kidney cancer  . Varicose Veins Sister   . Heart disease Sister        After age 30  . Heart attack Sister   . Heart disease Sister        After age 16  . Other Other        epilepsy  . Hypertension Other   . Cancer Paternal Grandmother        breast cancer   Social History   Socioeconomic History  . Marital status: Widowed    Spouse name: None  . Number of children: None  . Years of education: None  . Highest education level: None  Social Needs  . Financial resource strain: None  . Food insecurity - worry: None  . Food insecurity - inability: None  . Transportation needs - medical: None  . Transportation needs - non-medical: None  Occupational History  . None  Tobacco Use  .  Smoking status: Never Smoker  . Smokeless tobacco: Never Used  Substance and Sexual Activity  . Alcohol use: No  . Drug use: No  . Sexual activity: No  Other Topics Concern  . None  Social History Narrative  . None    Outpatient Encounter Medications as of 11/22/2017  Medication Sig  . amLODipine (NORVASC) 10 MG tablet Take 1 tablet (10 mg total) by mouth daily.  Marland Kitchen aspirin EC 81 MG tablet Take 81 mg by mouth daily.    . dorzolamide (TRUSOPT) 2 % ophthalmic solution PLACE 1 DROP INTO BOTH EYES 2 (TWO) TIMES DAILY.  Marland Kitchen levothyroxine (SYNTHROID, LEVOTHROID) 50 MCG tablet TAKE 1 TABLET BY MOUTH EVERY DAY  . losartan (COZAAR) 100 MG tablet Take 1 tablet (100 mg total) by mouth daily.  . Multiple Vitamins-Minerals (AIRBORNE GUMMIES) CHEW Chew 1 Piece by mouth daily.  . Multiple Vitamins-Minerals (PRESERVISION AREDS 2) CAPS Take 2 capsules by mouth 2 (two) times daily.   No facility-administered encounter medications on file as of 11/22/2017.     Review of Systems  Constitutional: Negative for appetite change and unexpected weight change.  HENT: Negative for congestion.   Respiratory: Negative for  cough and chest tightness.   Cardiovascular: Negative for chest pain and leg swelling.  Gastrointestinal: Negative for abdominal pain, diarrhea, nausea and vomiting.  Genitourinary: Negative for difficulty urinating and dysuria.  Musculoskeletal: Negative for myalgias.       One episode of back pain yesterday.  No pain now.    Skin: Negative for rash.  Neurological: Negative for dizziness, light-headedness and headaches.  Psychiatric/Behavioral: Negative for agitation and dysphoric mood.       Objective:    Physical Exam  Constitutional: She appears well-developed and well-nourished. No distress.  Neck: Neck supple.  Cardiovascular: Normal rate and regular rhythm.  Pulmonary/Chest: Breath sounds normal. No respiratory distress. She has no wheezes.  Abdominal: Soft. There is no tenderness.  Musculoskeletal: She exhibits no edema or tenderness.  Lymphadenopathy:    She has no cervical adenopathy.  Skin: No rash noted. No erythema.  Psychiatric: She has a normal mood and affect.    BP 138/62 (BP Location: Left Arm, Patient Position: Sitting, Cuff Size: Normal)   Pulse 80   Temp 98 F (36.7 C) (Oral)   Resp 15   SpO2 97%  Wt Readings from Last 3 Encounters:  11/11/17 117 lb (53.1 kg)  11/07/17 117 lb 3.2 oz (53.2 kg)  10/19/17 114 lb (51.7 kg)     Lab Results  Component Value Date   WBC 5.7 11/11/2017   HGB 12.2 11/11/2017   HCT 36.7 11/11/2017   PLT 158 11/11/2017   GLUCOSE 102 (H) 11/11/2017   CHOL 178 03/01/2016   TRIG 73.0 03/01/2016   HDL 64.20 03/01/2016   LDLDIRECT 76.0 08/31/2016   LDLCALC 100 (H) 03/01/2016   ALT 14 11/11/2017   AST 27 11/11/2017   NA 139 11/11/2017   K 3.9 11/11/2017   CL 105 11/11/2017   CREATININE 0.93 11/11/2017   BUN 22 (H) 11/11/2017   CO2 23 11/11/2017   TSH 5.26 (H) 11/07/2017   MICROALBUR <0.7 10/16/2017    Ct Head Wo Contrast  Result Date: 11/11/2017 CLINICAL DATA:  Pt states is here with htn. Pt states blood pressure  at home of 220/?. Pt denies pain. Pt denies dizziness, pain, shob. EXAM: CT HEAD WITHOUT CONTRAST TECHNIQUE: Contiguous axial images were obtained from the base of the skull through the vertex without  intravenous contrast. COMPARISON:  None. FINDINGS: Brain: No evidence of acute infarction, hemorrhage, hydrocephalus, extra-axial collection or mass lesion/mass effect. Vascular: No hyperdense vessel or unexpected calcification. Skull: Normal. Negative for fracture or focal lesion. Sinuses/Orbits: Globes and orbits are unremarkable. Mucosal thickening mostly opacifies the right sphenoid sinus. Remaining visualized sinuses are clear. Clear mastoid air cells. Other: None. IMPRESSION: 1. No intracranial abnormality. 2. Mucosal thickening mostly opacifies the right sphenoid sinus. Electronically Signed   By: Lajean Manes M.D.   On: 11/11/2017 22:48       Assessment & Plan:   Problem List Items Addressed This Visit    Anemia    Has a documented history of anemia.  Recent cbc wnl (11/11/17).        Essential hypertension, benign    Blood pressures varying as outlined.  BP check today by me 138-142/70s.  Not orthostatic on exam.  Will continue current medication regimen.  Does appear to have varying blood pressures on outside checks and on recent checks here in the office.  Amlodipine was increased to 10mg  q day recently.  Follow pressures and hold on making changes in her medication regimen.  Cardiac w/up as outlined.        Paroxysmal atrial fibrillation (HCC)    Has a history of paroxysmal atrial fib.  States diagnosed approximately one year ago.  Has noticed more symptoms over the last two weeks, which she relates to her afib.  In SR today.  Unclear as to the etiology of her intermittent "episodes" as outlined.  EKG - SR with no acute ischemic changes when compared to recent EKG.  She sees Dr Nehemiah Massed.  Discussed with her today regarding f/u.  Feel needs f/u cardiac evaluation to try and help determine  etiology.  No actual syncopal episodes.  Unclear if related to afib.  May need event monitor and further cardiac w/up.  She is in agreement.  Pt to see Dr Nehemiah Massed tomorrow.         Other Visit Diagnoses    Atrial fibrillation, unspecified type ([place])    -  Primary   Relevant Orders   EKG 12-Lead (Completed)       Einar Pheasant, MD

## 2017-11-23 ENCOUNTER — Encounter: Payer: Self-pay | Admitting: Internal Medicine

## 2017-11-23 NOTE — Assessment & Plan Note (Signed)
Has a documented history of anemia.  Recent cbc wnl (11/11/17).

## 2017-11-23 NOTE — Assessment & Plan Note (Signed)
Has a history of paroxysmal atrial fib.  States diagnosed approximately one year ago.  Has noticed more symptoms over the last two weeks, which she relates to her afib.  In SR today.  Unclear as to the etiology of her intermittent "episodes" as outlined.  EKG - SR with no acute ischemic changes when compared to recent EKG.  She sees Dr Nehemiah Massed.  Discussed with her today regarding f/u.  Feel needs f/u cardiac evaluation to try and help determine etiology.  No actual syncopal episodes.  Unclear if related to afib.  May need event monitor and further cardiac w/up.  She is in agreement.  Pt to see Dr Nehemiah Massed tomorrow.

## 2017-11-23 NOTE — Assessment & Plan Note (Signed)
Blood pressures varying as outlined.  BP check today by me 138-142/70s.  Not orthostatic on exam.  Will continue current medication regimen.  Does appear to have varying blood pressures on outside checks and on recent checks here in the office.  Amlodipine was increased to 10mg  q day recently.  Follow pressures and hold on making changes in her medication regimen.  Cardiac w/up as outlined.

## 2017-12-03 DIAGNOSIS — I119 Hypertensive heart disease without heart failure: Secondary | ICD-10-CM | POA: Insufficient documentation

## 2017-12-04 ENCOUNTER — Telehealth: Payer: Self-pay

## 2017-12-04 DIAGNOSIS — I482 Chronic atrial fibrillation, unspecified: Secondary | ICD-10-CM

## 2017-12-04 NOTE — Telephone Encounter (Signed)
Please advise 

## 2017-12-04 NOTE — Telephone Encounter (Signed)
Copied from [place] [id number]. Topic: Referral - Request >> Dec 04, 2017 11:43 AM Aurelio Brash B wrote: Reason for CRM:  Pt would like a referral to a different cardiologist as she feels that Dr Nehemiah Massed isn't a good fit for her.

## 2017-12-05 NOTE — Telephone Encounter (Signed)
Left detailed message to let pt know that the referral has been placed and she should be hearing from the cardiology appt in the next week and if not to give our office a call so we can check on the referral.

## 2017-12-05 NOTE — Telephone Encounter (Signed)
Please advise 

## 2017-12-05 NOTE — Telephone Encounter (Signed)
Pt called back and is aware of referral.

## 2017-12-05 NOTE — Telephone Encounter (Signed)
Referral is in process as requested 

## 2018-01-16 DIAGNOSIS — Z961 Presence of intraocular lens: Secondary | ICD-10-CM | POA: Insufficient documentation

## 2018-01-26 DIAGNOSIS — R55 Syncope and collapse: Secondary | ICD-10-CM | POA: Insufficient documentation

## 2018-01-26 NOTE — Progress Notes (Addendum)
Cardiology Office Note  Date:  01/28/2018   ID:  [name], DOB 08-22-1925, MRN [id number]  PCP:  Deanna Mc, MD   Chief Complaint  Patient presents with  . other    Former patient of Dr. Nehemiah White with a history A-fib. Meds reviewed by the pt. verbally. Pt. c/o LE edema and shortness of breath with over exertion.     HPI:  Ms Deanna White is a 84 yo woman with PMH of  Svt/atrial fibrillation per Holter monitor read in 2017 by Dr. Louisa White (results not available for review) Previous carotid studies demonstrated: <1-39% Hypertension Anemia Hyperlipidemia Who presents by referral from Dr. Nicki White for consultation of atrial tachycardia, and  possible paroxysmal atrial fibrillation   She reports that several years ago she had syncopal episode while at church Taken to the Er Reports work-up was benign Had follow-up with cardiology [facility],  Had echocardiogram stress test and Holter Was told the Holter showed atrial fibrillation Reading the notes indicates she had short runs of SVT but it was labeled atrial fibrillation Results unavailable for review but have been requested  In general reports no further episodes of syncope or near syncope  In the ER 11/2017 For high blood pressure, did not feel well Given hydralazine norvasc increased for discharge  Monitors blood pressure at home continues to run high 150-160 One episode 967 systolic Has worsening leg swelling on the higher dose amlodipine  In the past had fatigue on metoprolol  blood pressure.   Range between - 140-190/70-80. amlodipine increased to 10mg  q day.   Taking her medication regularly.    Outpatient records reviewed Holter monitor June 2017 short runs of SVT consistent with paroxysmal atrial fibrillation Felt at that time to had CHADS VASC of 2 Not treated with anticoagulation  Carotid ultrasound minimal bilateral carotid disease less than 39%  Echocardiogram ejection fraction of 89% grade 2  diastolic dysfunction moderate MR  Stress test July 2017  EKG personally reviewed by myself on todays visit Shows normal sinus rhythm rate 73 bpm APCs no significant ST or T wave changes  PMH:   has a past medical history of Allergy, Carotid artery occlusion, Colon polyps, Depression, Fall from slipping on ice (Feb. 2015), Heart murmur, Hypertension, Menopause, Pneumonia, Positive TB test, Sore throat, Thyroid disease, and Varicose veins.  PSH:    Past Surgical History:  Procedure Laterality Date  . ABDOMINAL HYSTERECTOMY    . APPENDECTOMY    . BILATERAL SALPINGOOPHORECTOMY  1964  . CESAREAN SECTION    . EYE SURGERY Right March 2016   Cataract  . TONSILLECTOMY      Current Outpatient Medications  Medication Sig Dispense Refill  . amLODipine (NORVASC) 10 MG tablet Take 1 tablet (10 mg total) by mouth daily. 90 tablet 3  . aspirin EC 81 MG tablet Take 81 mg by mouth daily.      . dorzolamide (TRUSOPT) 2 % ophthalmic solution PLACE 1 DROP INTO BOTH EYES 2 (TWO) TIMES DAILY.  12  . levothyroxine (SYNTHROID, LEVOTHROID) 50 MCG tablet TAKE 1 TABLET BY MOUTH EVERY DAY 90 tablet 3  . losartan (COZAAR) 100 MG tablet Take 1 tablet (100 mg total) by mouth daily. 90 tablet 2  . Multiple Vitamins-Minerals (PRESERVISION AREDS 2) CAPS Take 2 capsules by mouth 2 (two) times daily.     No current facility-administered medications for this visit.      Allergies:   Brimonidine and Adhesive [tape]   Social History:  The patient  reports that she has never smoked. She has never used smokeless tobacco. She reports that she does not drink alcohol or use drugs.   Family History:   family history includes Alcohol abuse in her brother; Cancer in her brother, brother, and paternal grandmother; Deep vein thrombosis in her mother; Heart attack in her mother and sister; Heart disease in her brother, mother, sister, and sister; Hypertension in her mother and other; Kidney disease in her father; Other in her  other; Varicose Veins in her sister.    Review of Systems: Review of Systems  Constitutional: Negative.   Respiratory: Negative.   Cardiovascular: Negative.   Gastrointestinal: Negative.   Musculoskeletal: Negative.   Neurological: Negative.   Psychiatric/Behavioral: Negative.   All other systems reviewed and are negative.    PHYSICAL EXAM: VS:  BP (!) 160/60 (BP Location: Right Arm, Patient Position: Sitting, Cuff Size: Normal)   Pulse 73   Ht 5\' 2"  (1.575 m)   Wt 119 lb (54 kg)   BMI 21.77 kg/m  , BMI Body mass index is 21.77 kg/m. GEN: Well nourished, well developed, in no acute distress  HEENT: normal  Neck: no JVD, carotid bruits, or masses Cardiac: RRR; no murmurs, rubs, or gallops,no edema  Respiratory:  clear to auscultation bilaterally, normal work of breathing GI: soft, nontender, nondistended, + BS MS: no deformity or atrophy  Skin: warm and dry, no rash Neuro:  Strength and sensation are intact Psych: euthymic mood, full affect    Recent Labs: 11/07/2017: TSH 5.26 11/11/2017: ALT 14; BUN 22; Creatinine, Ser 0.93; Hemoglobin 12.2; Platelets 158; Potassium 3.9; Sodium 139    Lipid Panel Lab Results  Component Value Date   CHOL 178 03/01/2016   HDL 64.20 03/01/2016   LDLCALC 100 (H) 03/01/2016   TRIG 73.0 03/01/2016      Wt Readings from Last 3 Encounters:  01/28/18 119 lb (54 kg)  11/11/17 117 lb (53.1 kg)  11/07/17 117 lb 3.2 oz (53.2 kg)       ASSESSMENT AND PLAN:  Paroxysmal atrial fibrillation ([place]) - Plan: EKG 12-Lead No clear evidence of atrial fibrillation based on Holter review 2019 Previous Holter monitor has been requested from 2019 (no atrial fibrillation, only short runs of atrial tachycardia noted) we have also requested 2017 Holter.  Notes from outside cardiology indicated SVT but then also reported atrial fibrillation short runs Hopefully they will send rhythm strips from 2017 for review Currently not on anticoagulation agree  with holding any anticoagulation until there is any confirmation of atrial fibrillation.    Peripheral vascular disease ([place]) - Plan: EKG 12-Lead Minimal carotid disease, repeat ultrasound not indicated   Atrial tachycardia  short runs, Asymptomatic We will monitor for now could consider adding low-dose beta-blocker if she becomes symptomatic  Essential hypertension, benign Blood pressure running high and having worsening leg swelling on amlodipine Recommended she stop the amlodipine Discussed various treatment options for her blood pressure including clonidine, Cardura, hydralazine, isosorbide She does not want a beta-blocker We will try clonidine 0.1 mg twice daily and continue losartan She will monitor blood pressure and call us with numbers and for any side effects  Anemia, unspecified type Improved over the past several years most recent hemoglobin 12.2  Near syncope Last episode 2017, etiology unclear, possibly prerenal  ADDENDUM: Holter monitor from February 2019 reviewed showing short runs of atrial tachycardia, rate up to 120 bpm,  rare PVCs,  No atrial fibrillation noted  Holter for 2017 has been requested  but we have difficulty receiving this from [facility].  My concern is that 2017 may have shown atrial tachycardia and not atrial fibrillation  Disposition:   F/U 6 months if needed for blood pressure review  Extensive notes and records reviewed and requested on today's visit  Total encounter time more than 60 minutes  Greater than 50% was spent in counseling and coordination of care with the patient    Orders Placed This Encounter  Procedures  . EKG 12-Lead     Signed, Esmond Plants, M.D., Ph.D. 01/28/2018  [place], [place]

## 2018-01-28 ENCOUNTER — Ambulatory Visit: Payer: Medicare Other | Admitting: Cardiovascular Disease

## 2018-01-28 ENCOUNTER — Encounter: Payer: Self-pay | Admitting: Cardiovascular Disease

## 2018-01-28 VITALS — BP 160/60 | HR 73 | Ht 62.0 in | Wt 119.0 lb

## 2018-01-28 DIAGNOSIS — R55 Syncope and collapse: Secondary | ICD-10-CM

## 2018-01-28 DIAGNOSIS — D649 Anemia, unspecified: Secondary | ICD-10-CM

## 2018-01-28 DIAGNOSIS — I739 Peripheral vascular disease, unspecified: Secondary | ICD-10-CM

## 2018-01-28 DIAGNOSIS — I10 Essential (primary) hypertension: Secondary | ICD-10-CM

## 2018-01-28 DIAGNOSIS — I480 Paroxysmal atrial fibrillation: Secondary | ICD-10-CM

## 2018-01-28 MED ORDER — CLONIDINE HCL 0.1 MG PO TABS: 0.1000 mg | ORAL_TABLET | Freq: Two times a day (BID) | ORAL | 11 refills | Status: DC

## 2018-01-28 MED ORDER — AMLODIPINE BESYLATE 2.5 MG PO TABS: 2.5000 mg | ORAL_TABLET | Freq: Every day | ORAL | 3 refills | Status: DC

## 2018-01-28 NOTE — Patient Instructions (Addendum)
Medication Instructions:  Your physician has recommended you make the following change in your medication:  1. HOLD Amlodipine 10 mg 2. START Clonidine 0.1 mg twice a day 3. IF blood pressure continues to run high, consistently 150-160 on the top number  START Amlodipine 2.5 mg once daily   For emergency high blood pressure You could take 1/2 or whole amlodipine Or take extra clonidine   Labwork:  No new labs needed  Testing/Procedures:  No further testing at this time   Follow-Up: It was a pleasure seeing you in the office today. Please call us if you have new issues that need to be addressed before your next appt.  [phone]  Your physician wants you to follow-up in: 6 months.  You will receive a reminder letter in the mail two months in advance. If you don't receive a letter, please call our office to schedule the follow-up appointment.  If you need a refill on your cardiac medications before your next appointment, please call your pharmacy.  For educational health videos Log in to : [email or web address] Or : [email or web address], password : [email or web address]

## 2018-03-21 ENCOUNTER — Other Ambulatory Visit: Payer: Self-pay | Admitting: Internal Medicine

## 2018-04-15 ENCOUNTER — Ambulatory Visit: Payer: Medicare Other | Admitting: Internal Medicine

## 2018-04-15 VITALS — BP 178/70 | HR 72 | Temp 98.2°F | Resp 18 | Wt 121.2 lb

## 2018-04-15 DIAGNOSIS — I872 Venous insufficiency (chronic) (peripheral): Secondary | ICD-10-CM | POA: Diagnosis not present

## 2018-04-15 DIAGNOSIS — I10 Essential (primary) hypertension: Secondary | ICD-10-CM

## 2018-04-15 LAB — COMPREHENSIVE METABOLIC PANEL
ALBUMIN: 4 g/dL (ref 3.5–5.2)
ALT: 14 U/L (ref 0–35)
AST: 23 U/L (ref 0–37)
Alkaline Phosphatase: 58 U/L (ref 39–117)
BILIRUBIN TOTAL: 0.2 mg/dL (ref 0.2–1.2)
BUN: 20 mg/dL (ref 6–23)
CO2: 26 meq/L (ref 19–32)
CREATININE: 1.15 mg/dL (ref 0.40–1.20)
Calcium: 9.1 mg/dL (ref 8.4–10.5)
Chloride: 103 mEq/L (ref 96–112)
GFR: 47.05 mL/min — ABNORMAL LOW (ref >60.00)
GLUCOSE: 97 mg/dL (ref 70–99)
POTASSIUM: 4.6 meq/L (ref 3.5–5.1)
SODIUM: 136 meq/L (ref 135–145)
TOTAL PROTEIN: 6.1 g/dL (ref 6.0–8.3)

## 2018-04-15 NOTE — Progress Notes (Signed)
Subjective:  [name], female    DOB: 01-Sep-1925  Age: 84 y.o. MRN: [id number]  CC: The primary encounter diagnosis was Essential hypertension. Diagnoses of Essential hypertension, benign and Venous insufficiency of both lower extremities were also pertinent to this visit.  HPI ELNITA SURPRENANT presents for 6 month follow up on hypertension. Accompanied by daughter   Seen by ES in Feb for hypertensive urgency  and advised to start the amlodipine at 2.5 , then seen in ED on Feb 10 for same . Hydralazine   IV given,  Amlodipine Dose  Increased to   5 mg .  At RN visit 3 days later dose increased to 10 mg for SBP  Of 210  Saw Dr Nicki Reaper , referred to Dr Rockey Situ for possible PAF .Marland Kitchen  Saw Nehemiah Massed first   In Feb,  Beta blocker planned pending results of  holter monitor ordered   Saw cardiology,  Amlodipine stopped by TG in April. Decided on trial of 0.1 bid of  clonidine and continued losartan  Has been checking her blood pressure at home and readings are quite labile, usually 545 to 625 systolic,  But dropped  to 90/60  This morning   Takes losartan in the am and the first dose of clonidine.   Not having dizziness   Some LE edema mild, managed with stockings (not worn today ) and leg elevation.  Denies orthopnea and weight gain.   Some ankle pain right lateral at night.    Still very active in the garden .  No falls  No walking stick or cane.    Sleep is interrupted by bladder .  2 trips per night .  Drinks caffeinated tea at lunchttime.  Doesn't drink coffee. Took and extra d1/2 tablet of clonidine last night and had a bladder accident.   Outpatient Medications Prior to Visit  Medication Sig Dispense Refill  . aspirin EC 81 MG tablet Take 81 mg by mouth daily.      . cloNIDine (CATAPRES) 0.1 MG tablet Take 1 tablet (0.1 mg total) by mouth 2 (two) times daily. 60 tablet 11  . dorzolamide (TRUSOPT) 2 % ophthalmic solution PLACE 1 DROP INTO BOTH EYES 2 (TWO) TIMES DAILY.  12  .  levothyroxine (SYNTHROID, LEVOTHROID) 50 MCG tablet TAKE 1 TABLET BY MOUTH EVERY DAY 90 tablet 3  . losartan (COZAAR) 100 MG tablet Take 1 tablet (100 mg total) by mouth daily. 90 tablet 2  . Multiple Vitamins-Minerals (PRESERVISION AREDS 2) CAPS Take 2 capsules by mouth 2 (two) times daily.    Marland Kitchen amLODipine (NORVASC) 2.5 MG tablet Take 1 tablet (2.5 mg total) by mouth daily. 180 tablet 3   No facility-administered medications prior to visit.     Review of Systems;  Patient denies headache, fevers, malaise, unintentional weight loss, skin rash, eye pain, sinus congestion and sinus pain, sore throat, dysphagia,  hemoptysis , cough, dyspnea, wheezing, chest pain, palpitations, orthopnea, edema, abdominal pain, nausea, melena, diarrhea, constipation, flank pain, dysuria, hematuria, urinary  Frequency, nocturia, numbness, tingling, seizures,  Focal weakness, Loss of consciousness,  Tremor, insomnia, depression, anxiety, and suicidal ideation.      Objective:  BP (!) 178/70 (BP Location: Left Arm, Patient Position: Sitting, Cuff Size: Normal)   Pulse 72   Temp 98.2 F (36.8 C) (Oral)   Resp 18   Wt 121 lb 3.2 oz (55 kg)   SpO2 98%   BMI 22.17 kg/m   BP Readings from  Last 3 Encounters:  04/15/18 (!) 178/70  01/28/18 (!) 160/60  11/22/17 138/62    Wt Readings from Last 3 Encounters:  04/15/18 121 lb 3.2 oz (55 kg)  01/28/18 119 lb (54 kg)  11/11/17 117 lb (53.1 kg)    General appearance: alert, cooperative and appears stated age Ears: normal TM's and external ear canals both ears Throat: lips, mucosa, and tongue normal; teeth and gums normal Neck: no adenopathy, no carotid bruit, supple, symmetrical, trachea midline and thyroid not enlarged, symmetric, no tenderness/mass/nodules Back: symmetric, no curvature. ROM normal. No CVA tenderness. Lungs: clear to auscultation bilaterally Heart: regular rate and rhythm, S1, S2 normal, no murmur, click, rub or gallop Abdomen: soft,  non-tender; bowel sounds normal; no masses,  no organomegaly Pulses: 2+ and symmetric Skin: Skin color, texture, turgor normal. No rashes or lesions Lymph nodes: Cervical, supraclavicular, and axillary nodes normal.  No results found for: HGBA1C  Lab Results  Component Value Date   CREATININE 1.15 04/15/2018   CREATININE 0.93 11/11/2017   CREATININE 0.96 11/07/2017    Lab Results  Component Value Date   WBC 5.7 11/11/2017   HGB 12.2 11/11/2017   HCT 36.7 11/11/2017   PLT 158 11/11/2017   GLUCOSE 97 04/15/2018   CHOL 178 03/01/2016   TRIG 73.0 03/01/2016   HDL 64.20 03/01/2016   LDLDIRECT 76.0 08/31/2016   LDLCALC 100 (H) 03/01/2016   ALT 14 04/15/2018   AST 23 04/15/2018   NA 136 04/15/2018   K 4.6 04/15/2018   CL 103 04/15/2018   CREATININE 1.15 04/15/2018   BUN 20 04/15/2018   CO2 26 04/15/2018   TSH 5.26 (H) 11/07/2017   MICROALBUR <0.7 10/16/2017    Ct Head Wo Contrast  Result Date: 11/11/2017 CLINICAL DATA:  Pt states is here with htn. Pt states blood pressure at home of 220/?. Pt denies pain. Pt denies dizziness, pain, shob. EXAM: CT HEAD WITHOUT CONTRAST TECHNIQUE: Contiguous axial images were obtained from the base of the skull through the vertex without intravenous contrast. COMPARISON:  None. FINDINGS: Brain: No evidence of acute infarction, hemorrhage, hydrocephalus, extra-axial collection or mass lesion/mass effect. Vascular: No hyperdense vessel or unexpected calcification. Skull: Normal. Negative for fracture or focal lesion. Sinuses/Orbits: Globes and orbits are unremarkable. Mucosal thickening mostly opacifies the right sphenoid sinus. Remaining visualized sinuses are clear. Clear mastoid air cells. Other: None. IMPRESSION: 1. No intracranial abnormality. 2. Mucosal thickening mostly opacifies the right sphenoid sinus. Electronically Signed   By: Lajean Manes M.D.   On: 11/11/2017 22:48    Assessment & Plan:   Problem List Items Addressed This Visit     Essential hypertension, benign    Labile,  explained to patient that "perfect is the enemy of good" so no dose changes today except to stagger meds and take losartan at lunch time , continue clonidine 0.1 mg bid   Advised to avoid extra doses of clonidine due to side effects.  Will use amlodipine 2.5 mg daily  for SBP > 160       Venous insufficiency of both lower extremities    elevate legs when at home sitting whenever it is possible.  [place] suggested for stockings.        Other Visit Diagnoses    Essential hypertension    -  Primary   Relevant Orders   Comprehensive metabolic panel (Completed)    A total of 25 minutes of face to face time was spent with patient more than  half of which was spent in counselling about the above mentioned conditions  and coordination of care   I have discontinued Tahliyah Anagnos. [place]'s amLODipine. I am also having her maintain her aspirin EC, PRESERVISION AREDS 2, dorzolamide, losartan, cloNIDine, and levothyroxine.  No orders of the defined types were placed in this encounter.   Medications Discontinued During This Encounter  Medication Reason  . amLODipine (NORVASC) 2.5 MG tablet Error    Follow-up: Return in about 6 months (around 10/16/2018) for hypertension .   Crecencio Mc, MD

## 2018-04-15 NOTE — Patient Instructions (Addendum)
You are doing well!  No medication changes were made today.   However,  To avoid low blood pressure in the morning:    Try taking the losartan at lunchtime ,  Continue the clonidine with breakfast and dinner    Your leg swelling is due to venous insufficiency .  This is not managed with diuretics,  But with leg elevation and compression  stockings when  You can tolerate it.    If your blood pressure spikes at night ,  Use amlodipine ,  Not more clonidine to bring it down.

## 2018-04-16 ENCOUNTER — Other Ambulatory Visit: Payer: Self-pay | Admitting: Internal Medicine

## 2018-04-16 DIAGNOSIS — R944 Abnormal results of kidney function studies: Secondary | ICD-10-CM

## 2018-04-17 ENCOUNTER — Encounter: Payer: Self-pay | Admitting: Internal Medicine

## 2018-04-17 DIAGNOSIS — I872 Venous insufficiency (chronic) (peripheral): Secondary | ICD-10-CM | POA: Insufficient documentation

## 2018-04-17 NOTE — Assessment & Plan Note (Signed)
elevate legs when at home sitting whenever it is possible.  [place] suggested for stockings.

## 2018-04-17 NOTE — Assessment & Plan Note (Addendum)
Labile,  explained to patient that "perfect is the enemy of good" so no dose changes today except to stagger meds and take losartan at lunch time , continue clonidine 0.1 mg bid   Advised to avoid extra doses of clonidine due to side effects.  Will use amlodipine 2.5 mg daily  for SBP > 160

## 2018-04-25 ENCOUNTER — Other Ambulatory Visit (INDEPENDENT_AMBULATORY_CARE_PROVIDER_SITE_OTHER): Payer: Medicare Other

## 2018-04-25 DIAGNOSIS — R944 Abnormal results of kidney function studies: Secondary | ICD-10-CM

## 2018-04-25 LAB — BASIC METABOLIC PANEL
BUN: 20 mg/dL (ref 6–23)
CHLORIDE: 100 meq/L (ref 96–112)
CO2: 28 meq/L (ref 19–32)
Calcium: 9 mg/dL (ref 8.4–10.5)
Creatinine, Ser: 1.08 mg/dL (ref 0.40–1.20)
GFR: 50.59 mL/min — ABNORMAL LOW (ref >60.00)
Glucose, Bld: 98 mg/dL (ref 70–99)
Potassium: 4.5 mEq/L (ref 3.5–5.1)
SODIUM: 134 meq/L — AB (ref 135–145)

## 2018-04-28 ENCOUNTER — Other Ambulatory Visit: Payer: Self-pay | Admitting: Internal Medicine

## 2018-04-28 DIAGNOSIS — N183 Chronic kidney disease, stage 3 unspecified: Secondary | ICD-10-CM

## 2018-04-28 MED ORDER — AMLODIPINE BESYLATE 5 MG PO TABS: 5.0000 mg | ORAL_TABLET | Freq: Every day | ORAL | 0 refills | Status: DC

## 2018-05-14 ENCOUNTER — Other Ambulatory Visit (INDEPENDENT_AMBULATORY_CARE_PROVIDER_SITE_OTHER): Payer: Medicare Other

## 2018-05-14 DIAGNOSIS — N183 Chronic kidney disease, stage 3 unspecified: Secondary | ICD-10-CM

## 2018-05-14 LAB — BASIC METABOLIC PANEL
BUN: 18 mg/dL (ref 6–23)
CHLORIDE: 102 meq/L (ref 96–112)
CO2: 28 meq/L (ref 19–32)
CREATININE: 1.2 mg/dL (ref 0.40–1.20)
Calcium: 9.3 mg/dL (ref 8.4–10.5)
GFR: 44.79 mL/min — ABNORMAL LOW (ref >60.00)
Glucose, Bld: 95 mg/dL (ref 70–99)
Potassium: 4.5 mEq/L (ref 3.5–5.1)
Sodium: 136 mEq/L (ref 135–145)

## 2018-05-17 ENCOUNTER — Other Ambulatory Visit: Payer: Self-pay | Admitting: Internal Medicine

## 2018-05-17 DIAGNOSIS — N183 Chronic kidney disease, stage 3 unspecified: Secondary | ICD-10-CM

## 2018-05-21 ENCOUNTER — Telehealth: Payer: Self-pay

## 2018-05-21 DIAGNOSIS — N183 Chronic kidney disease, stage 3 unspecified: Secondary | ICD-10-CM

## 2018-05-21 NOTE — Telephone Encounter (Signed)
Copied from [place] [id number]. Topic: Referral - Request >> May 21, 2018 10:09 AM Yvette Rack wrote: Reason for CRM: pt calling stating that she need a referral to a urologist for kidney function being low she would like to go to the [facility] urologist Dr Elenor Quinones

## 2018-05-22 NOTE — Addendum Note (Signed)
Addended by: Crecencio Mc on: 05/22/2018 05:03 PM   Modules accepted: Orders

## 2018-05-22 NOTE — Telephone Encounter (Signed)
He is not the right kind of kidney specialist . She needs a referral to a nephrologist  I will make the referral to [place]

## 2018-05-22 NOTE — Telephone Encounter (Signed)
Pt would like a referral to Dr. Darlys Gales for low kidney function.

## 2018-05-23 NOTE — Telephone Encounter (Signed)
Spoke with pt and informed her that Dr. Derrel Nip has referred her to [place] because Dr. Darlys Gales was not the specialist she needed to see. Pt gave a verbal understanding. Explained to pt that if she does not here anything from their office by next Thursday to please give our office a call so we can check on the referral. Pt gave a verbal understanding.

## 2018-07-16 ENCOUNTER — Other Ambulatory Visit: Payer: Self-pay | Admitting: Internal Medicine

## 2018-07-27 ENCOUNTER — Emergency Department
Admission: EM | Admit: 2018-07-27 | Discharge: 2018-07-27 | Disposition: A | Discharge status: Home / Self Care | Payer: Medicare Other | Attending: Emergency Medicine | Admitting: Emergency Medicine

## 2018-07-27 ENCOUNTER — Emergency Department: Payer: Medicare Other

## 2018-07-27 ENCOUNTER — Encounter: Payer: Self-pay | Admitting: Emergency Medicine

## 2018-07-27 DIAGNOSIS — Z79899 Other long term (current) drug therapy: Secondary | ICD-10-CM | POA: Diagnosis not present

## 2018-07-27 DIAGNOSIS — I10 Essential (primary) hypertension: Secondary | ICD-10-CM | POA: Diagnosis present

## 2018-07-27 LAB — BASIC METABOLIC PANEL
Anion gap: 9 (ref 5–15)
BUN: 24 mg/dL — ABNORMAL HIGH (ref 8–23)
CALCIUM: 8.8 mg/dL — AB (ref 8.9–10.3)
CO2: 25 mmol/L (ref 22–32)
CREATININE: 1.08 mg/dL — AB (ref 0.44–1.00)
Chloride: 105 mmol/L (ref 98–111)
GFR calc Af Amer: 51 mL/min — ABNORMAL LOW (ref >60)
GFR, EST NON AFRICAN AMERICAN: 44 mL/min — AB (ref >60)
Glucose, Bld: 99 mg/dL (ref 70–99)
POTASSIUM: 3.8 mmol/L (ref 3.5–5.1)
Sodium: 139 mmol/L (ref 135–145)

## 2018-07-27 LAB — CBC
HCT: 34.7 % — ABNORMAL LOW (ref 36.0–46.0)
Hemoglobin: 11.6 g/dL — ABNORMAL LOW (ref 12.0–15.0)
MCH: 32.9 pg (ref 26.0–34.0)
MCHC: 33.4 g/dL (ref 30.0–36.0)
MCV: 98.3 fL (ref 80.0–100.0)
Platelets: 152 10*3/uL (ref 150–400)
RBC: 3.53 MIL/uL — ABNORMAL LOW (ref 3.87–5.11)
RDW: 12.5 % (ref 11.5–15.5)
WBC: 5.7 10*3/uL (ref 4.0–10.5)
nRBC: 0 % (ref 0.0–0.2)

## 2018-07-27 LAB — TROPONIN I

## 2018-07-27 MED ORDER — CLONIDINE HCL 0.2 MG PO TABS: 0.2000 mg | ORAL_TABLET | Freq: Two times a day (BID) | ORAL | 11 refills | Status: DC

## 2018-07-27 MED ORDER — ONDANSETRON 4 MG PO TBDP
4.0000 mg | ORAL_TABLET | Freq: Once | ORAL | Status: DC
  Filled 2018-07-27: qty 1

## 2018-07-27 NOTE — ED Provider Notes (Signed)
[facility] Emergency Department Provider Note       Time seen: ----------------------------------------- 9:56 PM on 07/27/2018 -----------------------------------------   I have reviewed the triage vital signs and the nursing notes.  HISTORY   Chief Complaint Hypertension    HPI Deanna White is a 84 y.o. female with a history of depression, hypertension, pneumonia, thyroid disease who presents to the ED for hypertension.  Family states blood pressure at home was 197/92.  Patient states she is been struggling with her blood pressure intermittently since January.  She reports a headache with some nausea but denies any dizziness or other complaints.  She took an extra amlodipine today.  Past Medical History:  Diagnosis Date  . Allergy   . Carotid artery occlusion   . Colon polyps   . Depression   . Fall from slipping on ice Feb. 2015  . Heart murmur   . Hypertension   . Menopause   . Pneumonia   . Positive TB test   . Sore throat   . Thyroid disease   . Varicose veins     Patient Active Problem List   Diagnosis Date Noted  . Venous insufficiency of both lower extremities 04/17/2018  . Near syncope 01/26/2018  . Constipation 03/03/2017  . Flatulence 09/03/2016  . Paroxysmal atrial fibrillation ([place]) 09/03/2016  . Medicare annual wellness visit, subsequent 03/03/2016  . Encounter for preventive health examination 03/03/2016  . Anemia 02/25/2015  . Patient underweight 02/23/2015  . Hypothyroidism 02/23/2015  . Postmenopausal osteoporosis 02/22/2014  . Osteoporosis of vertebra 02/22/2014  . Essential hypertension, benign 02/20/2014  . Peripheral vascular disease ([place]) 09/15/2011    Past Surgical History:  Procedure Laterality Date  . ABDOMINAL HYSTERECTOMY    . APPENDECTOMY    . BILATERAL SALPINGOOPHORECTOMY  1964  . CESAREAN SECTION    . EYE SURGERY Right March 2016   Cataract  . TONSILLECTOMY      Allergies Brimonidine and  Adhesive [tape]  Social History Social History   Tobacco Use  . Smoking status: Never Smoker  . Smokeless tobacco: Never Used  Substance Use Topics  . Alcohol use: No  . Drug use: No   Review of Systems Constitutional: Negative for fever. Cardiovascular: Negative for chest pain. Respiratory: Negative for shortness of breath. Gastrointestinal: Negative for abdominal pain, positive for nausea Musculoskeletal: Negative for back pain. Skin: Negative for rash. Neurological: Negative for headaches, focal weakness or numbness.  All systems negative/normal/unremarkable except as stated in the HPI  ____________________________________________   PHYSICAL EXAM:  VITAL SIGNS: ED Triage Vitals  Enc Vitals Group     BP 07/27/18 1831 (!) 200/69     Pulse Rate 07/27/18 1831 84     Resp 07/27/18 1831 18     Temp 07/27/18 1831 98.3 F (36.8 C)     Temp Source 07/27/18 1831 Oral     SpO2 07/27/18 1831 99 %     Weight 07/27/18 1832 117 lb (53.1 kg)     Height 07/27/18 1832 5\' 2"  (1.575 m)     Head Circumference --      Peak Flow --      Pain Score 07/27/18 1832 0     Pain Loc --      Pain Edu? --      Excl. in [place]? --    Constitutional: Alert and oriented. Well appearing and in no distress. Eyes: Conjunctivae are normal. Normal extraocular movements. Cardiovascular: Normal rate, regular rhythm. No murmurs, rubs, or  gallops. Respiratory: Normal respiratory effort without tachypnea nor retractions. Breath sounds are clear and equal bilaterally. No wheezes/rales/rhonchi. Gastrointestinal: Soft and nontender. Normal bowel sounds Musculoskeletal: Nontender with normal range of motion in extremities. No lower extremity tenderness nor edema. Neurologic:  Normal speech and language. No gross focal neurologic deficits are appreciated.  Skin:  Skin is warm, dry and intact. No rash noted. Psychiatric: Mood and affect are normal. Speech and behavior are normal.   ____________________________________________  EKG: Interpreted by me.  Sinus rhythm rate 81 bpm, normal PR interval, normal QRS, normal QT  ____________________________________________  ED COURSE:  As part of my medical decision making, I reviewed the following data within the [place] History obtained from family if available, nursing notes, old chart and ekg, as well as notes from prior ED visits. Patient presented for hypertension, we will assess with labs and imaging as indicated at this time.   Procedures ____________________________________________   LABS (pertinent positives/negatives)  Labs Reviewed  BASIC METABOLIC PANEL - Abnormal; Notable for the following components:      Result Value   BUN 24 (*)    Creatinine, Ser 1.08 (*)    Calcium 8.8 (*)    GFR calc non Af Amer 44 (*)    GFR calc Af Amer 51 (*)    All other components within normal limits  CBC - Abnormal; Notable for the following components:   RBC 3.53 (*)    Hemoglobin 11.6 (*)    HCT 34.7 (*)    All other components within normal limits  TROPONIN I    RADIOLOGY  Chest x-ray reveals IMPRESSION: Age-indeterminate compression fracture of a lower thoracic or upper lumbar vertebral body. Recommend clinical correlation. No other acute abnormalities noted. ____________________________________________  DIFFERENTIAL DIAGNOSIS   Essential hypertension, renal insufficiency, medication noncompliance, occult infection, dehydration, electrolyte abnormality  FINAL ASSESSMENT AND PLAN  Hypertension   Plan: The patient had presented for hypertension. Patient's labs did not reveal any acute process. Patient's imaging revealed compression fractures but she is not having any back pain.  Patient blood pressure improved to 157/77 after she took her clonidine.  She is stable for outpatient follow-up.   Laurence Aly, MD   Note: This note was generated in part or whole with voice  recognition software. Voice recognition is usually quite accurate but there are transcription errors that can and very often do occur. I apologize for any typographical errors that were not detected and corrected.     Earleen Newport, MD 07/27/18 [phone]

## 2018-07-27 NOTE — ED Triage Notes (Signed)
Patient presents to the ED with a complaint of hypertension. Family states blood pressure at home was 197/92.   Patient states she has been struggling with her blood pressure being intermittently high since January.  Patient reports headache states not the worst of her life and some nausea.  Denies dizziness and blurry vision.  Patient took an extra amlodipine today which she states improved blood pressure for a little while but then it rose again. Patient states she had "a little chest pain earlier, nothing severe."

## 2018-07-27 NOTE — ED Notes (Signed)
Patient is eating graham crackers and applesauce and denies pain or nausea at this time.

## 2018-07-29 ENCOUNTER — Ambulatory Visit: Payer: Self-pay | Admitting: *Deleted

## 2018-07-29 NOTE — Telephone Encounter (Signed)
Pt called with her b/p continuing to be up since leaving the ED on Saturday. She was prescribed an  increase in her clonidine. She took the extra dose yesterday but only took half the dosage today. Her b/p had dropped to 126/?, and having some lethargy. Now her b/p is up to 177/74. She denies nausea, headache, blurred vision and weakness. She has had some blurry vision when she was trying to read.  FU appointment scheduled for this week.   Pt also reminded that if she starts having other symptoms including headache, blurred vision, nausea, vomiting or weakness to get to the hospital. Pt voiced understanding.  Reason for Disposition . Systolic BP  >= 364 OR Diastolic >= 680  Answer Assessment - Initial Assessment Questions 1. BLOOD PRESSURE: "What is the blood pressure?" "Did you take at least two measurements 5 minutes apart?"     177/74 2. ONSET: "When did you take your blood pressure?"     About an hour ago 3. HOW: "How did you obtain the blood pressure?" (e.g., visiting nurse, automatic home BP monitor)     Automatic home BP monitor 4. HISTORY: "Do you have a history of high blood pressure?"     yes 5. MEDICATIONS: "Are you taking any medications for blood pressure?" "Have you missed any doses recently?"     Yes, not missed any doses 6. OTHER SYMPTOMS: "Do you have any symptoms?" (e.g., headache, chest pain, blurred vision, difficulty breathing, weakness)     Some blurred vision  Protocols used: HIGH BLOOD PRESSURE-A-AH

## 2018-07-30 ENCOUNTER — Ambulatory Visit: Payer: Self-pay

## 2018-07-30 NOTE — Telephone Encounter (Signed)
Pt returned call and note per Dr Derrel Nip  was explained to patient to patient. Pt states her BP this am 180/79 and at noon 164/83. Pt verbalized understanding of all instructions per Dr Lupita Dawn note.

## 2018-07-30 NOTE — Telephone Encounter (Addendum)
Left message to call , [facility] may speak with patient

## 2018-07-30 NOTE — Telephone Encounter (Signed)
Follow up triage call from yesterday. Elevated BP yesterday  Spoke with patient , she states last night BP reading before bedtime 134/72 did take Clonidine 0.2 1 1/2 tablets.  Today am reading 180/79 no BP medication, will take after BP medication after  eating.    ED follow up appointment 08/01/18 See below triage note

## 2018-07-30 NOTE — Telephone Encounter (Signed)
I warned her in July about not taking highre does of clonidine because it would cause lethargy,  She should not take more than 0.2 mg twice daily of clonidine ,  And she was told in July to use the amlodipine 2.5 mg if her bp got too high.

## 2018-08-01 ENCOUNTER — Encounter: Payer: Self-pay | Admitting: Internal Medicine

## 2018-08-01 ENCOUNTER — Ambulatory Visit: Payer: Medicare Other | Admitting: Internal Medicine

## 2018-08-01 VITALS — BP 130/58 | HR 66 | Temp 97.6°F | Resp 14 | Ht 62.0 in | Wt 123.0 lb

## 2018-08-01 DIAGNOSIS — K5901 Slow transit constipation: Secondary | ICD-10-CM

## 2018-08-01 DIAGNOSIS — I10 Essential (primary) hypertension: Secondary | ICD-10-CM | POA: Diagnosis not present

## 2018-08-01 DIAGNOSIS — E034 Atrophy of thyroid (acquired): Secondary | ICD-10-CM

## 2018-08-01 LAB — TSH: TSH: 10.54 u[IU]/mL — ABNORMAL HIGH (ref 0.35–4.50)

## 2018-08-01 MED ORDER — CLONIDINE HCL 0.1 MG PO TABS: 0.1000 mg | ORAL_TABLET | Freq: Two times a day (BID) | ORAL | 11 refills | Status: DC

## 2018-08-01 MED ORDER — TRAZODONE HCL 50 MG PO TABS: 25.0000 mg | ORAL_TABLET | Freq: Every evening | ORAL | 3 refills | Status: DC | PRN

## 2018-08-01 NOTE — Patient Instructions (Addendum)
For today only;  Take 2.5 mg amldoipine at dinner time and 0.1 mg clonidine  Starting tomorrow :  Resume the lower dose clonidine  Continue 0.1 mg twice daily   Increase the amlodipine to 5 mg daily  Use the torsemide every other  daily  For fluid retention  If you do not tolerate this regimen  Or if your blood pressure is still > 140/90,  We will add losartan back.   You can safely Use colace and/or metamucil /miralax daily if needed for constipation     I recommend getting Gaviscon  or Mylanta Gas  To use the next time you have chest pain due to indigestion or gas     Trial of trazodone at bedtime .  Start with 1/2 tablet daily.

## 2018-08-01 NOTE — Progress Notes (Signed)
Subjective:  [name], female    DOB: Mar 29, 1925  Age: 84 y.o. MRN: [id number]  CC: The primary encounter diagnosis was Hypothyroidism due to acquired atrophy of thyroid. Diagnoses of Essential hypertension, benign and Slow transit constipation were also pertinent to this visit.  HPI Deanna White presents for ER follow up.  Treated in ER oct 26 for elevated blood pressure,  Hr dose of clonidine was increased to 0.2 mg bid  ,  Which has made her feel very lethargic   Currently taking 0.2 mg clonidine and 2.5 mg amlodipine daily.   Outpatient Medications Prior to Visit  Medication Sig Dispense Refill  . amLODipine (NORVASC) 5 MG tablet TAKE 1 TABLET BY MOUTH EVERY DAY (Patient taking differently: Take 2.5 mg by mouth daily. ) 90 tablet 1  . aspirin EC 81 MG tablet Take 81 mg by mouth daily.      . dorzolamide (TRUSOPT) 2 % ophthalmic solution PLACE 1 DROP INTO BOTH EYES 2 (TWO) TIMES DAILY.  12  . levothyroxine (SYNTHROID, LEVOTHROID) 50 MCG tablet TAKE 1 TABLET BY MOUTH EVERY DAY 90 tablet 3  . Multiple Vitamins-Minerals (PRESERVISION AREDS 2) CAPS Take 2 capsules by mouth 2 (two) times daily.    Marland Kitchen torsemide (DEMADEX) 5 MG tablet TAKE 1 TABLET BY MOUTH EVERY DAY IN THE MORNING AS NEEDED  5  . cloNIDine (CATAPRES) 0.2 MG tablet Take 1 tablet (0.2 mg total) by mouth 2 (two) times daily. 60 tablet 11   No facility-administered medications prior to visit.     Review of Systems;  Patient denies headache, fevers, malaise, unintentional weight loss, skin rash, eye pain, sinus congestion and sinus pain, sore throat, dysphagia,  hemoptysis , cough, dyspnea, wheezing, chest pain, palpitations, orthopnea, edema, abdominal pain, nausea, melena, diarrhea, constipation, flank pain, dysuria, hematuria, urinary  Frequency, nocturia, numbness, tingling, seizures,  Focal weakness, Loss of consciousness,  Tremor, insomnia, depression, anxiety, and suicidal ideation.      Objective:  BP  (!) 130/58 (BP Location: Left Arm, Patient Position: Sitting, Cuff Size: Normal)   Pulse 66   Temp 97.6 F (36.4 C) (Oral)   Resp 14   Ht 5\' 2"  (1.575 m)   Wt 123 lb (55.8 kg)   SpO2 98%   BMI 22.50 kg/m   BP Readings from Last 3 Encounters:  08/01/18 (!) 130/58  07/27/18 (!) 169/71  04/15/18 (!) 178/70    Wt Readings from Last 3 Encounters:  08/01/18 123 lb (55.8 kg)  07/27/18 117 lb (53.1 kg)  04/15/18 121 lb 3.2 oz (55 kg)    General appearance: alert, cooperative and appears stated age Ears: normal TM's and external ear canals both ears Throat: lips, mucosa, and tongue normal; teeth and gums normal Neck: no adenopathy, no carotid bruit, supple, symmetrical, trachea midline and thyroid not enlarged, symmetric, no tenderness/mass/nodules Back: symmetric, no curvature. ROM normal. No CVA tenderness. Lungs: clear to auscultation bilaterally Heart: regular rate and rhythm, S1, S2 normal, no murmur, click, rub or gallop Abdomen: soft, non-tender; bowel sounds normal; no masses,  no organomegaly Pulses: 2+ and symmetric Skin: Skin color, texture, turgor normal. No rashes or lesions Lymph nodes: Cervical, supraclavicular, and axillary nodes normal.  No results found for: HGBA1C  Lab Results  Component Value Date   CREATININE 1.08 (H) 07/27/2018   CREATININE 1.20 05/14/2018   CREATININE 1.08 04/25/2018    Lab Results  Component Value Date   WBC 5.7 07/27/2018   HGB 11.6 (  L) 07/27/2018   HCT 34.7 (L) 07/27/2018   PLT 152 07/27/2018   GLUCOSE 99 07/27/2018   CHOL 178 03/01/2016   TRIG 73.0 03/01/2016   HDL 64.20 03/01/2016   LDLDIRECT 76.0 08/31/2016   LDLCALC 100 (H) 03/01/2016   ALT 14 04/15/2018   AST 23 04/15/2018   NA 139 07/27/2018   K 3.8 07/27/2018   CL 105 07/27/2018   CREATININE 1.08 (H) 07/27/2018   BUN 24 (H) 07/27/2018   CO2 25 07/27/2018   TSH 10.54 (H) 08/01/2018   MICROALBUR <0.7 10/16/2017    Dg Chest 2 View  Result Date:  07/27/2018 CLINICAL DATA:  Hypertension. EXAM: CHEST - 2 VIEW COMPARISON:  None. FINDINGS: There is a compression fracture of a lower thoracic or upper lumbar vertebral body, age indeterminate. The heart, hila, mediastinum, lungs, and pleura otherwise unremarkable. IMPRESSION: Age-indeterminate compression fracture of a lower thoracic or upper lumbar vertebral body. Recommend clinical correlation. No other acute abnormalities noted. Electronically Signed   By: Dorise Bullion III M.D   On: 07/27/2018 19:25    Assessment & Plan:   Problem List Items Addressed This Visit    Constipation     Encouraged to  increase the fiber in her diet to 25 g daily using Fruit and vegetables , [employer] and Atkins protein bars , and the low carb breads .  Also recommended adding  miralax, metamucil, fibercon, or citrucel daily to supplement her fiber. She was advised that she  can also combine them daily with colace,  Also,  make 4 16 ounce servings of water a daily goal       Essential hypertension, benign    Advised to reduce clonidine dose to 0.1mg  bid and increase amlodipine to 5 mg daily.  Will add back  losartan if another adjustment is needed      Relevant Medications   torsemide (DEMADEX) 5 MG tablet   cloNIDine (CATAPRES) 0.1 MG tablet   Hypothyroidism - Primary   Relevant Orders   TSH (Completed)     A total of 25 minutes of face to face time was spent with patient more than half of which was spent in counselling about the above mentioned conditions  and coordination of care  I have changed Deanna White's cloNIDine. I am also having her start on traZODone. Additionally, I am having her maintain her aspirin EC, PRESERVISION AREDS 2, dorzolamide, levothyroxine, amLODipine, and torsemide.  Meds ordered this encounter  Medications  . cloNIDine (CATAPRES) 0.1 MG tablet    Sig: Take 1 tablet (0.1 mg total) by mouth 2 (two) times daily.    Dispense:  60 tablet    Refill:  11  . traZODone  (DESYREL) 50 MG tablet    Sig: Take 0.5-1 tablets (25-50 mg total) by mouth at bedtime as needed for sleep.    Dispense:  30 tablet    Refill:  3    Medications Discontinued During This Encounter  Medication Reason  . cloNIDine (CATAPRES) 0.2 MG tablet     Follow-up: Return in about 4 weeks (around 08/29/2018).   Crecencio Mc, MD

## 2018-08-01 NOTE — Telephone Encounter (Signed)
Patient advised of below and verbalized understanding.  

## 2018-08-02 ENCOUNTER — Ambulatory Visit: Payer: Medicare Other

## 2018-08-03 NOTE — Assessment & Plan Note (Signed)
Encouraged to  increase the fiber in her diet to 25 g daily using Fruit and vegetables , [employer] and Atkins protein bars , and the low carb breads .  Also recommended adding  miralax, metamucil, fibercon, or citrucel daily to supplement her fiber. She was advised that she  can also combine them daily with colace,  Also,  make 4 16 ounce servings of water a daily goal  

## 2018-08-03 NOTE — Assessment & Plan Note (Addendum)
Advised to reduce clonidine dose to 0.1mg  bid and increase amlodipine to 5 mg daily.  Will add back  losartan if another adjustment is needed

## 2018-08-05 ENCOUNTER — Telehealth: Payer: Self-pay | Admitting: Internal Medicine

## 2018-08-05 DIAGNOSIS — E034 Atrophy of thyroid (acquired): Secondary | ICD-10-CM

## 2018-08-05 MED ORDER — LEVOTHYROXINE SODIUM 75 MCG PO TABS: 75.0000 ug | ORAL_TABLET | Freq: Every day | ORAL | 1 refills | Status: DC

## 2018-08-05 NOTE — Telephone Encounter (Signed)
I am increasing her dose to 75 mcg immediately.  She needs a repeat TSH in 6 weeks.  please schedule lab visit

## 2018-08-06 NOTE — Telephone Encounter (Signed)
LMTCB. PEC may speak with pt.  

## 2018-08-08 ENCOUNTER — Ambulatory Visit (INDEPENDENT_AMBULATORY_CARE_PROVIDER_SITE_OTHER): Payer: Medicare Other

## 2018-08-08 ENCOUNTER — Other Ambulatory Visit (INDEPENDENT_AMBULATORY_CARE_PROVIDER_SITE_OTHER): Payer: Self-pay | Admitting: Nephrology

## 2018-08-08 DIAGNOSIS — N183 Chronic kidney disease, stage 3 unspecified: Secondary | ICD-10-CM

## 2018-08-08 DIAGNOSIS — I129 Hypertensive chronic kidney disease with stage 1 through stage 4 chronic kidney disease, or unspecified chronic kidney disease: Secondary | ICD-10-CM

## 2018-08-23 ENCOUNTER — Other Ambulatory Visit: Payer: Self-pay | Admitting: Internal Medicine

## 2018-08-26 ENCOUNTER — Ambulatory Visit: Payer: Medicare Other

## 2018-09-17 ENCOUNTER — Other Ambulatory Visit (INDEPENDENT_AMBULATORY_CARE_PROVIDER_SITE_OTHER): Payer: Medicare Other

## 2018-09-17 ENCOUNTER — Ambulatory Visit (INDEPENDENT_AMBULATORY_CARE_PROVIDER_SITE_OTHER): Payer: Medicare Other

## 2018-09-17 ENCOUNTER — Ambulatory Visit (INDEPENDENT_AMBULATORY_CARE_PROVIDER_SITE_OTHER): Payer: Medicare Other | Admitting: Family Medicine

## 2018-09-17 ENCOUNTER — Encounter: Payer: Self-pay | Admitting: Family Medicine

## 2018-09-17 VITALS — BP 142/72 | HR 79 | Temp 99.1°F | Resp 16 | Ht 61.2 in | Wt 127.4 lb

## 2018-09-17 VITALS — BP 142/72 | HR 79 | Temp 99.1°F | Ht 61.2 in | Wt 127.4 lb

## 2018-09-17 DIAGNOSIS — Z0000 Encounter for general adult medical examination without abnormal findings: Secondary | ICD-10-CM

## 2018-09-17 DIAGNOSIS — R059 Cough, unspecified: Secondary | ICD-10-CM

## 2018-09-17 DIAGNOSIS — E034 Atrophy of thyroid (acquired): Secondary | ICD-10-CM

## 2018-09-17 DIAGNOSIS — R0982 Postnasal drip: Secondary | ICD-10-CM

## 2018-09-17 DIAGNOSIS — R600 Localized edema: Secondary | ICD-10-CM

## 2018-09-17 DIAGNOSIS — R05 Cough: Secondary | ICD-10-CM | POA: Diagnosis not present

## 2018-09-17 DIAGNOSIS — J90 Pleural effusion, not elsewhere classified: Secondary | ICD-10-CM

## 2018-09-17 DIAGNOSIS — I739 Peripheral vascular disease, unspecified: Secondary | ICD-10-CM

## 2018-09-17 DIAGNOSIS — L03116 Cellulitis of left lower limb: Secondary | ICD-10-CM

## 2018-09-17 DIAGNOSIS — E871 Hypo-osmolality and hyponatremia: Secondary | ICD-10-CM

## 2018-09-17 LAB — BASIC METABOLIC PANEL
BUN: 12 mg/dL (ref 6–23)
CALCIUM: 8.7 mg/dL (ref 8.4–10.5)
CO2: 25 meq/L (ref 19–32)
CREATININE: 0.84 mg/dL (ref 0.40–1.20)
Chloride: 100 mEq/L (ref 96–112)
GFR: 67.55 mL/min (ref >60.00)
Glucose, Bld: 115 mg/dL — ABNORMAL HIGH (ref 70–99)
Potassium: 3.9 mEq/L (ref 3.5–5.1)
SODIUM: 133 meq/L — AB (ref 135–145)

## 2018-09-17 LAB — CBC
HEMATOCRIT: 33.2 % — AB (ref 36.0–46.0)
HEMOGLOBIN: 11.2 g/dL — AB (ref 12.0–15.0)
MCHC: 33.6 g/dL (ref 30.0–36.0)
MCV: 95.2 fl (ref 78.0–100.0)
Platelets: 235 10*3/uL (ref 150.0–400.0)
RBC: 3.49 Mil/uL — ABNORMAL LOW (ref 3.87–5.11)
RDW: 13 % (ref 11.5–15.5)
WBC: 6.9 10*3/uL (ref 4.0–10.5)

## 2018-09-17 LAB — TSH: TSH: 2.8 u[IU]/mL (ref 0.35–4.50)

## 2018-09-17 MED ORDER — TORSEMIDE 5 MG PO TABS: ORAL_TABLET | ORAL | 5 refills | Status: DC

## 2018-09-17 MED ORDER — BENZONATATE 100 MG PO CAPS: 100.0000 mg | ORAL_CAPSULE | Freq: Three times a day (TID) | ORAL | 0 refills | Status: DC | PRN

## 2018-09-17 MED ORDER — MUPIROCIN 2 % EX OINT: TOPICAL_OINTMENT | CUTANEOUS | 0 refills | Status: DC

## 2018-09-17 NOTE — Progress Notes (Signed)
Subjective:    [name], female    DOB: 1925/03/02, 84 y.o.   MRN: [id number]  HPI   Patient presents to clinic complaining of cough with annoying tickle in back of throat, swelling in bilateral lower extremities, left worse than right and some redness and left leg for past week or so.  Patient's daughter reports that she recently finished a course of steroids and antibiotics on 4 December prescribed by the ENT.  Patient states the antibiotics seem to be tough on her stomach, and she would prefer not to have to go through that again.  Patient does take torsemide 5 mg once per day due to chronic lower extremity swelling related to peripheral vascular disease.  Patient states she did bump the side of her left leg and broke the skin on LLE.  Denies any fever or chills.  Denies any nausea, vomiting or diarrhea.  Denies any phlegm production with cough.  Denies any wheezing.  Patient Active Problem List   Diagnosis Date Noted  . Venous insufficiency of both lower extremities 04/17/2018  . Near syncope 01/26/2018  . Constipation 03/03/2017  . Flatulence 09/03/2016  . Paroxysmal atrial fibrillation ([place]) 09/03/2016  . Medicare annual wellness visit, subsequent 03/03/2016  . Encounter for preventive health examination 03/03/2016  . Anemia 02/25/2015  . Patient underweight 02/23/2015  . Hypothyroidism 02/23/2015  . Postmenopausal osteoporosis 02/22/2014  . Osteoporosis of vertebra 02/22/2014  . Essential hypertension, benign 02/20/2014  . Peripheral vascular disease ([place]) 09/15/2011   Social History   Tobacco Use  . Smoking status: Never Smoker  . Smokeless tobacco: Never Used  Substance Use Topics  . Alcohol use: No   Review of Systems   Constitutional: Negative for chills, fatigue and fever.  HENT: Negative for congestion, ear pain, sinus pain and sore throat.   Eyes: Negative.   Respiratory: +cough. Negative for shortness of breath and wheezing.     Cardiovascular: Negative for chest pain, palpitations. +leg swelling.  Gastrointestinal: Negative for abdominal pain, diarrhea, nausea and vomiting.  Genitourinary: Negative for dysuria, frequency and urgency.  Musculoskeletal: Negative for arthralgias and myalgias.  Skin: pink color on LLE w/ swelling Neurological: Negative for syncope, light-headedness and headaches.  Psychiatric/Behavioral: The patient is not nervous/anxious.       Objective:   Physical Exam Constitutional:      General: She is not in acute distress.    Appearance: She is not toxic-appearing or diaphoretic.  HENT:     Nose:     Comments: Mild post nasal drip    Mouth/Throat:     Mouth: Mucous membranes are moist.  Eyes:     General: No scleral icterus.    Extraocular Movements: Extraocular movements intact.     Conjunctiva/sclera: Conjunctivae normal.  Neck:     Musculoskeletal: Normal range of motion and neck supple. No neck rigidity.  Cardiovascular:     Rate and Rhythm: Normal rate and regular rhythm.     Comments: +pedal pulses bilat Pulmonary:     Effort: Pulmonary effort is normal. No respiratory distress.     Breath sounds: Normal breath sounds. No wheezing, rhonchi or rales.  Musculoskeletal:     Right lower leg: Edema (+1 pitting) present.     Left lower leg: Edema (+2 pitting) present.  Skin:    General: Skin is warm and dry.     Comments: Pink coloration of skin on LLE, small scabbed over break in skin consistent with story of patient  bumping leg. Skin is not red or hot  Neurological:     Mental Status: She is alert and oriented to person, place, and time.  Psychiatric:        Mood and Affect: Mood normal.        Behavior: Behavior normal.       BP Readings from Last 3 Encounters:  09/17/18 (!) 142/72  09/17/18 (!) 142/72  08/01/18 (!) 130/58   Today's Vitals   09/17/18 1408  BP: (!) 142/72  Pulse: 79  Temp: 99.1 F (37.3 C)  TempSrc: Oral  SpO2: 93%  Weight: 127 lb 6.4 oz  (57.8 kg)  Height: 5' 1.2" (1.554 m)   Body mass index is 23.91 kg/m.  Assessment & Plan:    Peripheral vascular disease/bilateral leg edema- patient will increase her torsemide to 10 mg/day for 3 doses, then resume 5 mg/day dosing.  Patient also advised to keep legs elevated whenever sitting.  If torsemide is not effective in reducing edema, and she continues to have the redness and swelling of left lower extremity we can do Doppler study.  Cellulitis left lower leg- faint pink discoloration to skin could indicate beginning of a cellulitis infection.  Patient and daughter would prefer not to go back on antibiotic due to her just finishing one from her ENT.  Patient will put a thin layer mupirocin ointment on left lower extremity for 5 days and monitor for any worsening redness.  Cough/postnasal drip- lungs are clear on exam I suspect cough is related to postnasal drip.  We will get chest x-ray in clinic to further evaluate due to patient also having leg edema.  Patient will use Tessalon Perles as needed to help calm cough and also advised she can use a cough drops to calm cough as well.  Patient will keep regularly scheduled follow-up with PCP as planned.  She will return to clinic sooner if new issues arise or if current symptoms persist or worsen.

## 2018-09-17 NOTE — Patient Instructions (Addendum)
  Ms. Mesta , Thank you for taking time to come for your Medicare Wellness Visit. I appreciate your ongoing commitment to your health goals. Please review the following plan we discussed and let me know if I can assist you in the future.   Follow up as needed.    Bring a copy of your [place] and/or Living Will to be scanned into chart.  Keep all maintenance routine appointments.   Happy Holidays!  These are the goals we discussed: Goals    . Eat a Healthy Diet     3 Meals Daily       This is a list of the screening recommended for you and due dates:  Health Maintenance  Topic Date Due  . Tetanus Vaccine  02/06/2028  . Flu Shot  Completed  . DEXA scan (bone density measurement)  Completed  . Pneumonia vaccines  Completed

## 2018-09-17 NOTE — Progress Notes (Addendum)
Subjective:   Deanna White is a 84 y.o. female who presents for Medicare Annual (Subsequent) preventive examination.  Review of Systems:  No ROS.  Medicare Wellness Visit. Additional risk factors are reflected in the social history. Cardiac Risk Factors include: advanced age (>15men, >59 women);hypertension     Objective:     Vitals: BP (!) 142/72 (BP Location: Left Arm, Patient Position: Sitting, Cuff Size: Normal)   Pulse 79   Temp 99.1 F (37.3 C) (Oral)   Resp 16   Ht 5' 1.2" (1.554 m)   Wt 127 lb 6.4 oz (57.8 kg)   SpO2 93% Comment: 91-93  BMI 23.91 kg/m   Body mass index is 23.91 kg/m.  Advanced Directives 09/17/2018 07/27/2018 11/11/2017 08/01/2017 10/15/2015 10/09/2014  Does Patient Have a Medical Advance Directive? Yes No Yes No No No  Type of Advance Directive Deanna White  Does patient want to make changes to medical advance directive? No - Patient declined - - - - -  Copy of Deanna White in Chart? No - copy requested - - - - -  Would patient like information on creating a medical advance directive? - No - Patient declined - Yes (Deanna White/Ambulatory/Procedural Areas - Information given) - Yes - Educational materials given    Tobacco Social History   Tobacco Use  Smoking Status Never Smoker  Smokeless Tobacco Never Used     Counseling given: Not Answered   Clinical Intake:  Pre-visit preparation completed: Yes  Pain : 0-10 Pain Score: 2  Pain Location: Leg Pain Orientation: Left, Right Pain Descriptors / Indicators: Tender, Sore Pain Onset: 1 to 4 weeks ago Pain Relieving Factors: Rest. Taking fluid pill to relieve swelling. Applies vaseline.  Effect of Pain on Daily Activities: It does not affect her ADLs.  Pain Relieving Factors: Rest. Taking fluid pill to relieve swelling. Applies vaseline.   Diabetes: No  How often do you need to have someone help you when you read instructions, pamphlets, or other written  materials from your doctor or pharmacy?: 1 - Never  Interpreter Needed?: No     Past Medical History:  Diagnosis Date  . Allergy   . Carotid artery occlusion   . Colon polyps   . Depression   . Fall from slipping on ice Feb. 2015  . Heart murmur   . Hypertension   . Menopause   . Pneumonia   . Positive TB test   . Sore throat   . Thyroid disease   . Varicose veins    Past Surgical History:  Procedure Laterality Date  . ABDOMINAL HYSTERECTOMY    . APPENDECTOMY    . BILATERAL SALPINGOOPHORECTOMY  1964  . CESAREAN SECTION    . EYE SURGERY Right March 2016   Cataract  . TONSILLECTOMY     Family History  Problem Relation Age of Onset  . Heart disease Mother        After age 58  . Hypertension Mother   . Deep vein thrombosis Mother   . Heart attack Mother   . Kidney disease Father   . Alcohol abuse Brother   . Heart disease Brother   . Cancer Brother        Lung cancer  . Cancer Brother        kidney cancer  . Varicose Veins Sister   . Heart disease Sister        After age 48  . Heart  attack Sister   . Heart disease Sister        After age 88  . Other Other        epilepsy  . Hypertension Other   . Cancer Paternal Grandmother        breast cancer   Social History   Socioeconomic History  . Marital status: Widowed    Spouse name: Not on file  . Number of children: Not on file  . Years of education: Not on file  . Highest education level: Not on file  Occupational History  . Not on file  Social Needs  . Financial resource strain: Not on file  . Food insecurity:    Worry: Not on file    Inability: Not on file  . Transportation needs:    Medical: Not on file    Non-medical: Not on file  Tobacco Use  . Smoking status: Never Smoker  . Smokeless tobacco: Never Used  Substance and Sexual Activity  . Alcohol use: No  . Drug use: No  . Sexual activity: Never  Lifestyle  . Physical activity:    Days per week: Not on file    Minutes per session: Not  on file  . Stress: Not on file  Relationships  . Social connections:    Talks on phone: Not on file    Gets together: Not on file    Attends religious service: Not on file    Active member of club or organization: Not on file    Attends meetings of clubs or organizations: Not on file    Relationship status: Not on file  Other Topics Concern  . Not on file  Social History Narrative  . Not on file    Outpatient Encounter Medications as of 09/17/2018  Medication Sig  . amLODipine (NORVASC) 5 MG tablet TAKE 1 TABLET BY MOUTH EVERY DAY (Patient taking differently: Take 2.5 mg by mouth daily. )  . aspirin EC 81 MG tablet Take 81 mg by mouth daily.    . cloNIDine (CATAPRES) 0.1 MG tablet Take 1 tablet (0.1 mg total) by mouth 2 (two) times daily.  . dorzolamide (TRUSOPT) 2 % ophthalmic solution PLACE 1 DROP INTO BOTH EYES 2 (TWO) TIMES DAILY.  Marland Kitchen levothyroxine (SYNTHROID, LEVOTHROID) 75 MCG tablet Take 1 tablet (75 mcg total) by mouth daily.  Marland Kitchen losartan (COZAAR) 50 MG tablet Take 50 mg by mouth daily.  . Multiple Vitamins-Minerals (PRESERVISION AREDS 2) CAPS Take 2 capsules by mouth 2 (two) times daily.  . traZODone (DESYREL) 50 MG tablet TAKE 1/2 TO 1 TABLET BY MOUTH AT BEDTIME AS NEEDED FOR SLEEP  . [DISCONTINUED] torsemide (DEMADEX) 5 MG tablet TAKE 1 TABLET BY MOUTH EVERY DAY IN THE MORNING AS NEEDED   No facility-administered encounter medications on file as of 09/17/2018.     Activities of Daily Living In your present state of health, do you have any difficulty performing the following activities: 09/17/2018  Hearing? Y  Comment Hearing aid, L ear  Vision? N  Difficulty concentrating or making decisions? N  Walking or climbing stairs? Y  Dressing or bathing? N  Doing errands, shopping? N  Preparing Food and eating ? N  Using the Toilet? N  In the past six months, have you accidently leaked urine? N  Do you have problems with loss of bowel control? N  Managing your  Medications? N  Managing your Finances? N  Housekeeping or managing your Housekeeping? N  Some recent data might  be hidden    Patient Care Team: Deanna Mc, MD as PCP - General (Internal Medicine) Deanna Skains, MD as Consulting Physician (Cardiology)    Assessment:   This is a routine wellness examination for Deanna White.  Presents with daughter Deanna White. HIPAA compliant. Reports Losartan 50mg  added by kidney specialist, Dr. Candiss Norse,  for elevated blood pressure; home readings placed in primary care provider box per patient request. Notes coughing and shortness of breath every other word since medication started.  Decrease in appetite. Lower leg bilateral swelling with redness and tenderness.  Taking torsemide daily. Same day appointment scheduled with Philis Nettle, NP for follow up.   Send for repeat TSH per order.   Keep all maintenance routine appointments.   Next visit with her doctor 10/18/2018.  Health Screenings  Mammogram -06/23/13 Colonoscopy-02/20/2009 Bone Density-07/25/14. Osteoporosis. Glaucoma-yes. Visits every 6 months Hearing-Left ear hearing aid worn Glucose-07/27/18-99 Direct LDL-08/31/16 (76.0) B12-10/16/17 (814)  Social  Alcohol intake-no Smoking history- no Smokers in home?none Illicit drug use?none Exercise walking Diet-low sodium. Vegetarian.   Safety  Patient feels safe at home.  Patient does have smoke detectors at home  Patient does wear sunscreen or protective clothing when in direct sunlight  Patient does wear seat belt when driving or riding with others.   Activities of Daily Living Patient can do their own household chores. Denies needing assistance with: driving, feeding themselves, getting from bed to chair, getting to the toilet, bathing/showering, dressing, managing money, climbing flight of stairs, or preparing meals.   Depression Screen Patient denies losing interest in daily life, feeling hopeless, or crying easily over simple problems.    Fall Screen Patient denies being afraid of falling or falling in the last year.   Memory Screen Patient denies problems with memory, misplacing items, and is able to balance checkbook/bank accounts.  Patient is alert, normal appearance, oriented to person/place/and time. Correctly identified the president of the [place], recall of 3/3 objects, and performing simple calculations.  Patient displays appropriate judgement and can read correct time from watch face.   Immunizations The following Immunizations are up to date: Influenza, shingles, pneumonia, and tetanus.   Other Providers Patient Care Team: Deanna Mc, MD as PCP - General (Internal Medicine) Deanna Skains, MD as Consulting Physician (Cardiology)  Exercise Activities and Dietary recommendations Current Exercise Habits: Home exercise routine, Type of exercise: walking, Intensity: Mild  Goals    . Eat a Healthy Diet     3 Meals Daily       Fall Risk Fall Risk  09/17/2018 08/01/2017 03/01/2016 05/28/2014 02/20/2014  Falls in the past year? 0 No No Yes Yes  Number falls in past yr: - - - 1 1  Injury with Fall? - - - Yes Yes  Risk Factor Category  - - - - High Fall Risk  Risk for fall due to : - - - - History of fall(s)   Depression Screen PHQ 2/9 Scores 09/17/2018 08/01/2017 03/01/2016 05/28/2014  PHQ - 2 Score 0 0 0 0  PHQ- 9 Score - 0 - -     Cognitive Function     6CIT Screen 09/17/2018 08/01/2017  What Year? 0 points 0 points  What month? 0 points 0 points  What time? 0 points 0 points  Count back from 20 0 points 0 points  Months in reverse 0 points 0 points  Repeat phrase 0 points 0 points  Total Score 0 0    Immunization History  Administered Date(s)  Administered  . Influenza Split 07/01/2014  . Influenza-Unspecified 07/05/2016, 07/04/2017, 06/29/2018  . Pneumococcal Conjugate-13 02/20/2014  . Pneumococcal Polysaccharide-23 10/03/2003, 02/22/2015  . Td 10/02/2006  . Tdap 02/05/2018    Screening Tests Health Maintenance  Topic Date Due  . TETANUS/TDAP  02/06/2028  . INFLUENZA VACCINE  Completed  . DEXA SCAN  Completed  . PNA vac Low Risk Adult  Completed      Plan:    End of life planning; Advance aging; Advanced directives discussed. Copy of current HCPOA/Living Will requested.    I have personally reviewed and noted the following in the patient's chart:   . Medical and social history . Use of alcohol, tobacco or illicit drugs  . Current medications and supplements . Functional ability and status . Nutritional status . Physical activity . Advanced directives . List of other physicians . Hospitalizations, surgeries, and ER visits in previous 12 months . Vitals . Screenings to include cognitive, depression, and falls . Referrals and appointments  In addition, I have reviewed and discussed with patient certain preventive protocols, quality metrics, and best practice recommendations. A written personalized care plan for preventive services as well as general preventive health recommendations were provided to patient.     OBrien-Blaney, Cauy Melody L, LPN  [id number]   I have reviewed the above information and agree with above.   Deborra Medina, MD

## 2018-09-18 NOTE — Addendum Note (Signed)
Addended by: Philis Nettle on: 09/18/2018 11:30 AM   Modules accepted: Orders

## 2018-09-23 ENCOUNTER — Ambulatory Visit (INDEPENDENT_AMBULATORY_CARE_PROVIDER_SITE_OTHER): Payer: Medicare Other

## 2018-09-23 ENCOUNTER — Other Ambulatory Visit (INDEPENDENT_AMBULATORY_CARE_PROVIDER_SITE_OTHER): Payer: Medicare Other

## 2018-09-23 DIAGNOSIS — E871 Hypo-osmolality and hyponatremia: Secondary | ICD-10-CM

## 2018-09-23 DIAGNOSIS — R600 Localized edema: Secondary | ICD-10-CM

## 2018-09-23 DIAGNOSIS — R05 Cough: Secondary | ICD-10-CM | POA: Diagnosis not present

## 2018-09-23 DIAGNOSIS — R059 Cough, unspecified: Secondary | ICD-10-CM

## 2018-09-23 DIAGNOSIS — J90 Pleural effusion, not elsewhere classified: Secondary | ICD-10-CM | POA: Diagnosis not present

## 2018-09-23 LAB — BASIC METABOLIC PANEL
BUN: 12 mg/dL (ref 6–23)
CO2: 25 mEq/L (ref 19–32)
Calcium: 8.6 mg/dL (ref 8.4–10.5)
Chloride: 99 mEq/L (ref 96–112)
Creatinine, Ser: 0.91 mg/dL (ref 0.40–1.20)
GFR: 61.58 mL/min (ref >60.00)
Glucose, Bld: 116 mg/dL — ABNORMAL HIGH (ref 70–99)
Potassium: 4.4 mEq/L (ref 3.5–5.1)
Sodium: 133 mEq/L — ABNORMAL LOW (ref 135–145)

## 2018-09-24 ENCOUNTER — Encounter: Payer: Self-pay | Admitting: Family Medicine

## 2018-09-24 ENCOUNTER — Ambulatory Visit: Payer: Medicare Other | Admitting: Family Medicine

## 2018-09-24 VITALS — BP 152/62 | HR 90 | Temp 98.3°F | Ht 61.0 in | Wt 123.4 lb

## 2018-09-24 DIAGNOSIS — R059 Cough, unspecified: Secondary | ICD-10-CM

## 2018-09-24 DIAGNOSIS — R05 Cough: Secondary | ICD-10-CM

## 2018-09-24 DIAGNOSIS — L03116 Cellulitis of left lower limb: Secondary | ICD-10-CM | POA: Diagnosis not present

## 2018-09-24 DIAGNOSIS — J90 Pleural effusion, not elsewhere classified: Secondary | ICD-10-CM

## 2018-09-24 DIAGNOSIS — R600 Localized edema: Secondary | ICD-10-CM

## 2018-09-24 NOTE — Progress Notes (Addendum)
Subjective:    [name], female    DOB: 1925-02-18, 84 y.o.   MRN: [id number]  HPI  Presents to clinic for recheck on leg edema. She was treated with increased dose of torsemide 10mg  for 3 days, after 3 days she resumed 5 mg per day dosing. The skin looked faintly pink, possible cellulitis suspected. However, patient had just finished oral antibiotics from ENT so she did not want oral antibiotics again, but did agree to topical mupirocin ointment.   CXR was done 09/17/18 and showed left pleural effusion, repeat CXR done 09/23/18 did show volume decrease in pleural effusion.   Patient states the swelling in both of her legs has improved, still does have some in the left.  Also has been using the topical mupirocin ointment on left lower extremity, and pinkness of skin has improved as well.  Still does have a little bit of a dry cough, but this seems improved as well.  Recent labs reviewed: BMP Latest Ref Rng & Units 09/23/2018 09/17/2018 07/27/2018  Glucose 70 - 99 mg/dL 116(H) 115(H) 99  BUN 6 - 23 mg/dL 12 12 24(H)  Creatinine 0.40 - 1.20 mg/dL 0.91 0.84 1.08(H)  Sodium 135 - 145 mEq/L 133(L) 133(L) 139  Potassium 3.5 - 5.1 mEq/L 4.4 3.9 3.8  Chloride 96 - 112 mEq/L 99 100 105  CO2 19 - 32 mEq/L 25 25 25   Calcium 8.4 - 10.5 mg/dL 8.6 8.7 8.8(L)   CBC Latest Ref Rng & Units 09/17/2018 07/27/2018 11/11/2017  WBC 4.0 - 10.5 K/uL 6.9 5.7 5.7  Hemoglobin 12.0 - 15.0 g/dL 11.2(L) 11.6(L) 12.2  Hematocrit 36.0 - 46.0 % 33.2(L) 34.7(L) 36.7  Platelets 150.0 - 400.0 K/uL 235.0 152 158   Patient Active Problem List   Diagnosis Date Noted  . Venous insufficiency of both lower extremities 04/17/2018  . Near syncope 01/26/2018  . Constipation 03/03/2017  . Flatulence 09/03/2016  . Paroxysmal atrial fibrillation ([place]) 09/03/2016  . Medicare annual wellness visit, subsequent 03/03/2016  . Encounter for preventive health examination 03/03/2016  . Anemia 02/25/2015  . Patient  underweight 02/23/2015  . Hypothyroidism 02/23/2015  . Postmenopausal osteoporosis 02/22/2014  . Osteoporosis of vertebra 02/22/2014  . Essential hypertension, benign 02/20/2014  . Peripheral vascular disease ([place]) 09/15/2011   Social History   Tobacco Use  . Smoking status: Never Smoker  . Smokeless tobacco: Never Used  Substance Use Topics  . Alcohol use: No   Review of Systems  Constitutional: Negative for chills, fatigue and fever.  HENT: Negative for congestion, ear pain, sinus pain and sore throat.   Eyes: Negative.   Respiratory: +cough. Negative for shortness of breath and wheezing.   Cardiovascular: Negative for chest pain, palpitations. +leg swelling.  Gastrointestinal: Negative for abdominal pain, diarrhea, nausea and vomiting.  Genitourinary: Negative for dysuria, frequency and urgency.  Musculoskeletal: Negative for arthralgias and myalgias.  Skin: Negative for color change, pallor and rash.  Neurological: Negative for syncope, light-headedness and headaches.  Psychiatric/Behavioral: The patient is not nervous/anxious.       Objective:   Physical Exam   Constitutional:      General: She is not in acute distress.    Appearance: She is not toxic-appearing or diaphoretic.  HENT:     Nose:     Comments: Mild post nasal drip    Mouth/Throat:     Mouth: Mucous membranes are moist.  Eyes:     General: No scleral icterus.    Extraocular  Movements: Extraocular movements intact.     Conjunctiva/sclera: Conjunctivae normal.  Neck:     Musculoskeletal: Normal range of motion and neck supple. No neck rigidity.  Cardiovascular:     Rate and Rhythm: Normal rate and regular rhythm.     Comments: +pedal pulses bilat Pulmonary:     Effort: Pulmonary effort is normal. No respiratory distress.     Breath sounds: Normal breath sounds. No wheezing, rhonchi or rales.  Musculoskeletal:     Right lower leg: Edema (trace) present.     Left lower leg: Edema (+1 pitting)  present.  Skin:    General: Skin is warm and dry.     Comments: Pink coloration on LLE looks improved. Skin is not hot or red. No open or draining areas on skin.   Neurological:     Mental Status: She is alert and oriented to person, place, and time. Gait is normal.  Psychiatric:        Mood and Affect: Mood normal.        Behavior: Behavior normal.   Wt Readings from Last 3 Encounters:  09/24/18 123 lb 6.4 oz (56 kg)  09/17/18 127 lb 6.4 oz (57.8 kg)  09/17/18 127 lb 6.4 oz (57.8 kg)   3# weight loss, most likely from diuresis of fluid.  Vitals:   09/24/18 1035  BP: (!) 152/62  Pulse: 90  Temp: 98.3 F (36.8 C)  SpO2: 94%      Assessment & Plan:   Cellulitis left lower leg - very faint pink coloration of skin still present, but is improved from last visit.  Patient will continue mupirocin ointment for the next 3 days.  Bilateral leg edema, left worse than right-edema does appear improved from last visit. Patient again declines doppler to rule out DVT  Left pleural effusion/cough - pleural effusion size is decreased on repeat chest x-ray.  Due to patient still having some swelling and minor cough we will do another 3 days of torsemide 10 mg, then she will go down to 5 mg for 4 days, then after that she will only use torsemide as needed for days when she has increased leg swelling.  Patient has upcoming nephrology appointment on January 4, with repeat lab work planned at that time.  I offered to do repeat lab work in clinic today, but she states she will have this done at her nephrology appointment.  We will also plan to do a repeat chest x-ray to follow-up on left pleural effusion, future order for this placed.  Patient has upcoming appointment with PCP on January 17, she will keep this appointment for her regular follow-up.  Advised to return to clinic sooner if any issues arise

## 2018-09-24 NOTE — Patient Instructions (Signed)
Swelling looks better, but still some present  Take 10 mg of torsemide for 3 days starting today  Then take 5 mg of torsemide for 4 days,  Then go back to using 5 mg torsemide as needed when having leg swelling  Your CXR does show improved pleural effusion when compared to last CXR, I will put in future order for repeat imaging in about 2 weeks to look for continued improvement

## 2018-09-27 ENCOUNTER — Ambulatory Visit (INDEPENDENT_AMBULATORY_CARE_PROVIDER_SITE_OTHER): Payer: Medicare Other

## 2018-09-27 DIAGNOSIS — J90 Pleural effusion, not elsewhere classified: Secondary | ICD-10-CM | POA: Diagnosis not present

## 2018-09-30 ENCOUNTER — Other Ambulatory Visit: Payer: Self-pay | Admitting: Family Medicine

## 2018-09-30 DIAGNOSIS — R05 Cough: Secondary | ICD-10-CM

## 2018-09-30 DIAGNOSIS — R059 Cough, unspecified: Secondary | ICD-10-CM

## 2018-10-01 NOTE — Telephone Encounter (Signed)
Refilled: 09/17/2018 Last OV: 08/01/2018 Next OV: 10/18/2018

## 2018-10-08 ENCOUNTER — Telehealth: Payer: Self-pay | Admitting: Internal Medicine

## 2018-10-08 DIAGNOSIS — M7989 Other specified soft tissue disorders: Secondary | ICD-10-CM

## 2018-10-08 NOTE — Telephone Encounter (Signed)
Her sister Peggie is concerned about the calf pain Deanna White   has been having  /  She may have a DVT which leg is bothering her?  I will order an ultrasound to rule out DVT

## 2018-10-09 ENCOUNTER — Ambulatory Visit
Admission: RE | Admit: 2018-10-09 | Discharge: 2018-10-09 | Disposition: A | Discharge status: Home / Self Care | Payer: Medicare Other | Source: Ambulatory Visit | Attending: Internal Medicine | Admitting: Internal Medicine

## 2018-10-09 DIAGNOSIS — M7989 Other specified soft tissue disorders: Secondary | ICD-10-CM | POA: Insufficient documentation

## 2018-10-09 NOTE — Telephone Encounter (Signed)
Melissa I just ordered  Stat US OF MS  Shutt'S LEFT LOWER LEG.

## 2018-10-09 NOTE — Telephone Encounter (Signed)
I've called her and her sisters number. I have left VM for both to please rtc so I can get this scheduled for them.

## 2018-10-09 NOTE — Telephone Encounter (Addendum)
[facility] sister returned call and states she will wait for Melissa to call her back. Mrs. Rachell Cipro advised please call Chistine on home #  [phone] due to her just leaving her house.

## 2018-10-09 NOTE — Telephone Encounter (Signed)
I have already contacted Ondine and she is aware to go to [facility] for her ultrasound today. She was on her way when I spoke to her.

## 2018-10-09 NOTE — Addendum Note (Signed)
Addended by: Crecencio Mc on: 10/09/2018 11:42 AM   Modules accepted: Orders

## 2018-10-09 NOTE — Telephone Encounter (Signed)
Left leg in the calf that is pretty painful, redness has gone down some and not quite as swollen.

## 2018-10-18 ENCOUNTER — Encounter: Payer: Self-pay | Admitting: Internal Medicine

## 2018-10-18 ENCOUNTER — Ambulatory Visit: Payer: Medicare Other | Admitting: Internal Medicine

## 2018-10-18 DIAGNOSIS — I10 Essential (primary) hypertension: Secondary | ICD-10-CM | POA: Diagnosis not present

## 2018-10-18 DIAGNOSIS — R600 Localized edema: Secondary | ICD-10-CM

## 2018-10-18 DIAGNOSIS — J069 Acute upper respiratory infection, unspecified: Secondary | ICD-10-CM

## 2018-10-18 DIAGNOSIS — I872 Venous insufficiency (chronic) (peripheral): Secondary | ICD-10-CM

## 2018-10-18 DIAGNOSIS — I739 Peripheral vascular disease, unspecified: Secondary | ICD-10-CM | POA: Diagnosis not present

## 2018-10-18 MED ORDER — AMLODIPINE BESYLATE 5 MG PO TABS: 5.0000 mg | ORAL_TABLET | Freq: Every day | ORAL | 1 refills | Status: DC

## 2018-10-18 MED ORDER — TORSEMIDE 5 MG PO TABS: ORAL_TABLET | ORAL | 5 refills | Status: DC

## 2018-10-18 MED ORDER — LOSARTAN POTASSIUM 100 MG PO TABS: 100.0000 mg | ORAL_TABLET | Freq: Every day | ORAL | 1 refills | Status: DC

## 2018-10-18 NOTE — Patient Instructions (Addendum)
Increase the losartan dose to 100 mg daily for your blood pressure  Keep your  leg wound covered with nonstick telfa until it is completely healed.  You do not need any more antibitotic  Continue torsemide every 3rd day     elevate legs when possible and always  Elevated for 15 mintues before putting on  compression stockings  Light pressure is better than no pressure for leg stockings ( 15 mm hg)   [email or web address]   Has a great selection    Your runny nose may be fron a viral infection .  The gentamycin will not help.  .you can use Benadryl at night to help stop the running  Flush sinuses daily with saline to reduce viral and bacterial burden

## 2018-10-18 NOTE — Progress Notes (Signed)
Subjective:  [name], female    DOB: 08/06/1925  Age: 85 y.o. MRN: [id number]  CC: Diagnoses of Peripheral vascular disease ([place]), Bilateral leg edema, Venous insufficiency of both lower extremities, Essential hypertension, benign, and Viral URI were pertinent to this visit.  HPI Deanna White presents for follow up on calf swelling and redness.  Treated for cellulitis Dec 24 by Tiffany Kocher  And diuretics used/  Declined [place] at that time. Complained to sister peggie Royce Macadamia or persistent pain , was sent Jan 7 for U/S ,  No DVT.  Symptoms  now much improved.  Wound is healing but still tender,  Has not started wearing compression stockings yet.    Saw nephrology in December ,  Resumed losartan at 50 mg .  Home readings are high.  has been using torsemide about every 3 days.      URI symptoms with nasal drainage.  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 aspirin EC 81 MG tablet Take 81 mg by mouth daily.      . benzonatate (TESSALON) 100 MG capsule TAKE 1 CAPSULE BY MOUTH THREE TIMES A DAY AS NEEDED 30 capsule 0  . cloNIDine (CATAPRES) 0.1 MG tablet Take 1 tablet (0.1 mg total) by mouth 2 (two) times daily. 60 tablet 11  . dorzolamide (TRUSOPT) 2 % ophthalmic solution PLACE 1 DROP INTO BOTH EYES 2 (TWO) TIMES DAILY.  12  . levothyroxine (SYNTHROID, LEVOTHROID) 75 MCG tablet Take 1 tablet (75 mcg total) by mouth daily. 90 tablet 1  . Multiple Vitamins-Minerals (PRESERVISION AREDS 2) CAPS Take 2 capsules by mouth 2 (two) times daily.    . mupirocin ointment (BACTROBAN) 2 % Apply thin layer to affected area on left lower leg once daily for 5 days 22 g 0  . amLODipine (NORVASC) 5 MG tablet TAKE 1 TABLET BY MOUTH EVERY DAY (Patient taking differently: Take 2.5 mg by mouth daily. )  90 tablet 1  . losartan (COZAAR) 50 MG tablet Take 50 mg by mouth daily.    Marland Kitchen torsemide (DEMADEX) 5 MG tablet Take 2 tablets by mouth daily for 3 days, then take 1 tablet by mouth daily PRN leg swelling. 30 tablet 5  . gentamicin ointment (GARAMYCIN) 0.1 % APPLY TO NOSE THREE TIMES DAILY     No facility-administered medications prior to visit.     Review of Systems;  Patient denies headache, fevers, malaise, unintentional weight loss, skin rash, eye pain,  sinus pain, sore throat, dysphagia,  hemoptysis , cough, dyspnea, wheezing, chest pain, palpitations, orthopnea, edema, abdominal pain, nausea, melena, diarrhea, constipation, flank pain, dysuria, hematuria, urinary  Frequency, nocturia, numbness, tingling, seizures,  Focal weakness, Loss of consciousness,  Tremor, insomnia, depression, anxiety, and suicidal ideation.      Objective:  BP (!) 160/66 (BP Location: Left Arm, Patient Position: Sitting, Cuff Size: Normal)   Pulse 64   Temp 97.9 F (36.6 C) (Oral)   Resp 15   Ht 5\' 1"  (1.549 m)   Wt 124 lb 6.4 oz (56.4 kg)   SpO2 98%   BMI 23.51 kg/m   BP Readings from Last 3 Encounters:  10/18/18 (!) 160/66  09/24/18 (!) 152/62  09/17/18 (!) 142/72    Wt Readings from Last  3 Encounters:  10/18/18 124 lb 6.4 oz (56.4 kg)  09/24/18 123 lb 6.4 oz (56 kg)  09/17/18 127 lb 6.4 oz (57.8 kg)    General appearance: alert, cooperative and appears stated age Ears: normal TM's and external ear canals both ears Throat: lips, mucosa, and tongue normal; teeth and gums normal Neck: no adenopathy, no carotid bruit, supple, symmetrical, trachea midline and thyroid not enlarged, symmetric, no tenderness/mass/nodules Back: symmetric, no curvature. ROM normal. No CVA tenderness. Lungs: clear to auscultation bilaterally Heart: regular rate and rhythm, S1, S2 normal, no murmur, click, rub or gallop Abdomen: soft, non-tender; bowel sounds normal; no masses,  no organomegaly Pulses: 2+ and  symmetric Skin: Left calf with healing venous ulcer, , no erythema. No rashes or lesions Lymph nodes: Cervical, supraclavicular, and axillary nodes normal.  No results found for: HGBA1C  Lab Results  Component Value Date   CREATININE 0.91 09/23/2018   CREATININE 0.84 09/17/2018   CREATININE 1.08 (H) 07/27/2018    Lab Results  Component Value Date   WBC 6.9 09/17/2018   HGB 11.2 (L) 09/17/2018   HCT 33.2 (L) 09/17/2018   PLT 235.0 09/17/2018   GLUCOSE 116 (H) 09/23/2018   CHOL 178 03/01/2016   TRIG 73.0 03/01/2016   HDL 64.20 03/01/2016   LDLDIRECT 76.0 08/31/2016   LDLCALC 100 (H) 03/01/2016   ALT 14 04/15/2018   AST 23 04/15/2018   NA 133 (L) 09/23/2018   K 4.4 09/23/2018   CL 99 09/23/2018   CREATININE 0.91 09/23/2018   BUN 12 09/23/2018   CO2 25 09/23/2018   TSH 2.80 09/17/2018   MICROALBUR <0.7 10/16/2017    US Venous Img Lower Unilateral Left  Result Date: 10/09/2018 CLINICAL DATA:  85 year-old female with left calf tenderness and swelling for 1 month. EXAM: LEFT LOWER EXTREMITY VENOUS DOPPLER ULTRASOUND TECHNIQUE: Gray-scale sonography with graded compression, as well as color Doppler and duplex ultrasound were performed to evaluate the lower extremity deep venous systems from the level of the common femoral vein and including the common femoral, femoral, profunda femoral, popliteal and calf veins including the posterior tibial, peroneal and gastrocnemius veins when visible. The superficial great saphenous vein was also interrogated. Spectral Doppler was utilized to evaluate flow at rest and with distal augmentation maneuvers in the common femoral, femoral and popliteal veins. COMPARISON:  None. FINDINGS: Contralateral Common Femoral Vein: Respiratory phasicity is normal and symmetric with the symptomatic side. No evidence of thrombus. Normal compressibility. Common Femoral Vein: No evidence of thrombus. Normal compressibility, respiratory phasicity and response to  augmentation. Saphenofemoral Junction: No evidence of thrombus. Normal compressibility and flow on color Doppler imaging. Profunda Femoral Vein: No evidence of thrombus. Normal compressibility and flow on color Doppler imaging. Femoral Vein: No evidence of thrombus. Normal compressibility, respiratory phasicity and response to augmentation. Popliteal Vein: No evidence of thrombus. Normal compressibility, respiratory phasicity and response to augmentation. Calf Veins: No evidence of thrombus. Normal compressibility and flow on color Doppler imaging. Superficial Great Saphenous Vein: No evidence of thrombus. Normal compressibility. Venous Reflux:  None. Other Findings:  None. IMPRESSION: No evidence of deep venous thrombosis. Electronically Signed   By: Jacqulynn Cadet M.D.   On: 10/09/2018 16:46    Assessment & Plan:   Problem List Items Addressed This Visit    Essential hypertension, benign    Not at goal.   Advised to increase losartan dose from 50 mg to 100 mg daily.       Relevant Medications  losartan (COZAAR) 100 MG tablet   torsemide (DEMADEX) 5 MG tablet   amLODipine (NORVASC) 5 MG tablet   Peripheral vascular disease ([facility])   Relevant Medications   losartan (COZAAR) 100 MG tablet   torsemide (DEMADEX) 5 MG tablet   amLODipine (NORVASC) 5 MG tablet   Venous insufficiency of both lower extremities    With healing venous ulcer on left calf.  Advised to keep wound covered until completely healed.  Elevate legs whever possible and start using light compression knee highs.  Limit torsemide use to once every 3 days for now.  May need to dc amlodipine in the future if edema becomes difficult to manage      Relevant Medications   losartan (COZAAR) 100 MG tablet   torsemide (DEMADEX) 5 MG tablet   amLODipine (NORVASC) 5 MG tablet   Viral URI    URI is most likely viral given the mild HEENT Symptoms  And normal exam.   I have explained that in viral URIS, an antibiotic will not help the  symptoms and will increase the risk of developing diarrhea.,  Trial of benadryl for PND,  prn tylenol 650 mq 8 hrs for aches and pains,  Sinus flushes with Milta Deiters Med's rinse, and cough suppressant available OTC prn. .         Relevant Medications   gentamicin ointment (GARAMYCIN) 0.1 %    Other Visit Diagnoses    Bilateral leg edema       Relevant Medications   torsemide (DEMADEX) 5 MG tablet    .A total of 25 minutes of face to face time was spent with patient more than half of which was spent in counselling about the above mentioned conditions  and coordination of care   I have changed Inez Catalina S. Vosler's losartan, torsemide, and amLODipine. I am also having her maintain her aspirin EC, PRESERVISION AREDS 2, dorzolamide, cloNIDine, levothyroxine, mupirocin ointment, benzonatate, and gentamicin ointment.  Meds ordered this encounter  Medications  . losartan (COZAAR) 100 MG tablet    Sig: Take 1 tablet (100 mg total) by mouth daily.    Dispense:  90 tablet    Refill:  1    KEEP ON FILE FOR FUTURE REFILLS  . torsemide (DEMADEX) 5 MG tablet    Sig: 1 tablet by mouth daily PRN leg swelling.    Dispense:  30 tablet    Refill:  5    KEEP ON FILE FOR FUTURE REFILLS  . amLODipine (NORVASC) 5 MG tablet    Sig: Take 1 tablet (5 mg total) by mouth daily.    Dispense:  90 tablet    Refill:  1    KEEP ON FILE FOR FUTURE REFILLS    Medications Discontinued During This Encounter  Medication Reason  . losartan (COZAAR) 50 MG tablet   . torsemide (DEMADEX) 5 MG tablet   . amLODipine (NORVASC) 5 MG tablet     Follow-up: Return in about 3 months (around 01/17/2019).   Crecencio Mc, MD

## 2018-10-20 DIAGNOSIS — J069 Acute upper respiratory infection, unspecified: Secondary | ICD-10-CM | POA: Insufficient documentation

## 2018-10-20 NOTE — Assessment & Plan Note (Addendum)
With healing venous ulcer on left calf.  Advised to keep wound covered until completely healed.  Elevate legs whever possible and start using light compression knee highs.  Limit torsemide use to once every 3 days for now.  May need to dc amlodipine in the future if edema becomes difficult to manage

## 2018-10-20 NOTE — Assessment & Plan Note (Addendum)
Not at goal.   Advised to increase losartan dose from 50 mg to 100 mg daily.

## 2018-10-20 NOTE — Assessment & Plan Note (Signed)
URI is most likely viral given the mild HEENT Symptoms  And normal exam.   I have explained that in viral URIS, an antibiotic will not help the symptoms and will increase the risk of developing diarrhea.,  Trial of benadryl for PND,  prn tylenol 650 mq 8 hrs for aches and pains,  Sinus flushes with Milta Deiters Med's rinse, and cough suppressant available OTC prn. Marland Kitchen

## 2018-11-06 DIAGNOSIS — H02051 Trichiasis without entropian right upper eyelid: Secondary | ICD-10-CM | POA: Insufficient documentation

## 2018-12-04 LAB — BASIC METABOLIC PANEL
BUN: 16 (ref 4–21)
Creatinine: 1 (ref 0.5–1.1)
Glucose: 94
Potassium: 4.2 (ref 3.4–5.3)
Sodium: 137 (ref 137–147)

## 2018-12-13 ENCOUNTER — Other Ambulatory Visit: Payer: Self-pay

## 2019-01-14 ENCOUNTER — Other Ambulatory Visit: Payer: Self-pay

## 2019-01-14 NOTE — Telephone Encounter (Signed)
Called patient from recall.  No answer. [place] Patient is past due for a 6 month follow up.  Patient will need an EVISIT

## 2019-01-16 ENCOUNTER — Ambulatory Visit (INDEPENDENT_AMBULATORY_CARE_PROVIDER_SITE_OTHER): Payer: Medicare Other | Admitting: Family Medicine

## 2019-01-16 ENCOUNTER — Other Ambulatory Visit: Payer: Self-pay | Admitting: Internal Medicine

## 2019-01-16 DIAGNOSIS — L03116 Cellulitis of left lower limb: Secondary | ICD-10-CM | POA: Diagnosis not present

## 2019-01-16 MED ORDER — CEPHALEXIN 500 MG PO CAPS: 500.0000 mg | ORAL_CAPSULE | Freq: Two times a day (BID) | ORAL | 0 refills | Status: AC

## 2019-01-16 NOTE — Progress Notes (Signed)
[name], female   DOB: 1925/06/30, 85 y.o.   MRN: [id number]  Virtual Visit via Video Note  This visit type was conducted due to national recommendations for restrictions regarding the COVID-19 pandemic (e.g. social distancing). This format is felt to be most appropriate for this patient at this time.  All issues noted in this document were discussed and addressed.  No physical exam was performed (except for noted visual exam findings with Video Visits).   I connected with Kelton Pillar on 01/16/19 at  2:00 PM EDT by a video enabled telemedicine application and verified that I am speaking with the correct person using two identifiers. Location patient: home Location provider: [place] Persons participating in the virtual visit: patient, provider  I discussed the limitations, risks, security and privacy concerns of performing an evaluation and management service by telephone and the availability of in person appointments. I also discussed with the patient that there may be a patient responsible charge related to this service. The patient expressed understanding and agreed to proceed.  HPI:  Patient and I connected via video to discuss a sore on the back of her left lower leg that has been present off and on since December 2019.  Patient states she did have a small opening of the area that healed up pretty well with a small scab, but then a small scab fell off and on the area has begun to drain clear fluid slightly and is sore to touch with surrounding redness.  Denies fever or chills.  Denies edema in lower extremities that is greater than the sometimes trace edema she will have on occasion.  Denies cough or shortness of breath.  Denies chest pain.  Denies GI or GU issues.   ROS: See pertinent positives and negatives per HPI.  Past Medical History:  Diagnosis Date  . Allergy   . Carotid artery occlusion   . Colon polyps   . Depression   . Fall from slipping on ice  Feb. 2015  . Heart murmur   . Hypertension   . Menopause   . Pneumonia   . Positive TB test   . Sore throat   . Thyroid disease   . Varicose veins     Past Surgical History:  Procedure Laterality Date  . ABDOMINAL HYSTERECTOMY    . APPENDECTOMY    . BILATERAL SALPINGOOPHORECTOMY  1964  . CESAREAN SECTION    . EYE SURGERY Right March 2016   Cataract  . TONSILLECTOMY      Family History  Problem Relation Age of Onset  . Heart disease Mother        After age 82  . Hypertension Mother   . Deep vein thrombosis Mother   . Heart attack Mother   . Kidney disease Father   . Alcohol abuse Brother   . Heart disease Brother   . Cancer Brother        Lung cancer  . Cancer Brother        kidney cancer  . Varicose Veins Sister   . Heart disease Sister        After age 36  . Heart attack Sister   . Heart disease Sister        After age 75  . Other Other        epilepsy  . Hypertension Other   . Cancer Paternal Grandmother        breast cancer   Social History   Tobacco  Use  . Smoking status: Never Smoker  . Smokeless tobacco: Never Used  Substance Use Topics  . Alcohol use: No    Current Outpatient Medications:  .  amLODipine (NORVASC) 5 MG tablet, Take 1 tablet (5 mg total) by mouth daily., Disp: 90 tablet, Rfl: 1 .  aspirin EC 81 MG tablet, Take 81 mg by mouth daily.  , Disp: , Rfl:  .  benzonatate (TESSALON) 100 MG capsule, TAKE 1 CAPSULE BY MOUTH THREE TIMES A DAY AS NEEDED, Disp: 30 capsule, Rfl: 0 .  cloNIDine (CATAPRES) 0.1 MG tablet, Take 1 tablet (0.1 mg total) by mouth 2 (two) times daily., Disp: 60 tablet, Rfl: 11 .  dorzolamide (TRUSOPT) 2 % ophthalmic solution, PLACE 1 DROP INTO BOTH EYES 2 (TWO) TIMES DAILY., Disp: , Rfl: 12 .  gentamicin ointment (GARAMYCIN) 0.1 %, APPLY TO NOSE THREE TIMES DAILY, Disp: , Rfl:  .  levothyroxine (SYNTHROID, LEVOTHROID) 75 MCG tablet, Take 1 tablet (75 mcg total) by mouth daily., Disp: 90 tablet, Rfl: 1 .  losartan  (COZAAR) 100 MG tablet, Take 1 tablet (100 mg total) by mouth daily., Disp: 90 tablet, Rfl: 1 .  Multiple Vitamins-Minerals (PRESERVISION AREDS 2) CAPS, Take 2 capsules by mouth 2 (two) times daily., Disp: , Rfl:  .  mupirocin ointment (BACTROBAN) 2 %, Apply thin layer to affected area on left lower leg once daily for 5 days, Disp: 22 g, Rfl: 0 .  torsemide (DEMADEX) 5 MG tablet, 1 tablet by mouth daily PRN leg swelling., Disp: 30 tablet, Rfl: 5  EXAM:   GENERAL: alert, oriented, appears well and in no acute distress  HEENT: atraumatic, conjunttiva clear, no obvious abnormalities on inspection of external nose and ears  NECK: normal movements of the head and neck  LUNGS: on inspection no signs of respiratory distress, breathing rate appears normal, no obvious gross SOB, gasping or wheezing  CV: no obvious cyanosis  MS: moves all visible extremities without noticeable abnormality.   SKIN: Patient unable to show me the back of her left calf through the video, she has a video camera on her desktop computer and is unable to orient the camera appropriately so we get a visual of the area.  Patient is able to describe area well to me, it is approximately the size of a dime with a small open area in the middle that is draining some clear liquid. The surrounding skin is slightly red and warm.  PSYCH/NEURO: pleasant and cooperative, no obvious depression or anxiety, speech and thought processing grossly intact  ASSESSMENT AND PLAN:  Discussed the following assessment and plan:  Cellulitis of left lower extremity - Plan: cephALEXin (KEFLEX) 500 MG capsule  Due to patient description of skin of left lower extremity, I suspect she does have a cellulitis.  We will treat with Keflex twice daily for 10 days.  Advised patient to keep the skin clean and dry.  Advised to splint skin daily with mild soap, apply thin layer of bacitracin ointment over small open area and cover with either a Band-Aid or a  small piece of gauze.  Advised to monitor for any increasing redness, increase in size of open area, development of fever, increasing edema and to call office right away if any of these issues occur.   I discussed the assessment and treatment plan with the patient. The patient was provided an opportunity to ask questions and all were answered. The patient agreed with the plan and demonstrated an understanding of the  instructions.   The patient was advised to call back or seek an in-person evaluation if the symptoms worsen or if the condition fails to improve as anticipated.    Jodelle Green, FNP

## 2019-01-24 ENCOUNTER — Ambulatory Visit: Payer: Medicare Other | Admitting: Internal Medicine

## 2019-01-29 ENCOUNTER — Ambulatory Visit (INDEPENDENT_AMBULATORY_CARE_PROVIDER_SITE_OTHER): Payer: Medicare Other | Admitting: Internal Medicine

## 2019-01-29 ENCOUNTER — Other Ambulatory Visit: Payer: Self-pay

## 2019-01-29 DIAGNOSIS — L97909 Non-pressure chronic ulcer of unspecified part of unspecified lower leg with unspecified severity: Secondary | ICD-10-CM

## 2019-01-29 DIAGNOSIS — I872 Venous insufficiency (chronic) (peripheral): Secondary | ICD-10-CM

## 2019-01-29 MED ORDER — HYDROCHLOROTHIAZIDE 25 MG PO TABS: 25.0000 mg | ORAL_TABLET | Freq: Every day | ORAL | 0 refills | Status: DC

## 2019-01-29 NOTE — Progress Notes (Signed)
Virtual Visit via Telephone Note  This visit type was conducted due to national recommendations for restrictions regarding the COVID-19 pandemic (e.g. social distancing).  This format is felt to be most appropriate for this patient at this time.  All issues noted in this document were discussed and addressed.  No physical exam was performed (except for noted visual exam findings with Video Visits).   I connected with@ on 01/29/19 at  9:30 AM EDT by telephone  verified that I am speaking with the correct person using two identifiers. Location patient: home Location provider: work or home office Persons participating in the virtual visit: patient, provider  I discussed the limitations, risks, security and privacy concerns of performing an evaluation and management service by telephone and the availability of in person appointments. I also discussed with the patient that there may be a patient responsible charge related to this service. The patient expressed understanding and agreed to proceed.  Interactive audio and video telecommunications were attempted between this provider and patient, however failed, due to patient having technical difficulties OR patient did not have access to video capability.  We continued and completed visit with audio only.   Reason for visit: FOLLOW UP ON HYPERTENSION AND OTHER CHRONIC CONDITIONS   HPI:   TREATED FOR CELLULITIS OF left calf with keflex  April 16 But has now developed a sore spot on her left lateral malleolus  that is not open or draining but is raised slightly red and scab like,  Not warm .  Has been present since before December   that is really tender and disrupted her sleeping .  She has venous insufficiency and ankle swelling. Not elevating her legs because it hurts to prop her legs on pillows.  She is taking  furosemide every other day but is not wearing compression stockings.  Hypertension: patient checks blood pressure twice weekly at home.   Readings have been for the most part > 140/80 at rest . Patient is following a reduce salt diet most days and is taking medications as prescribed  ROS: See pertinent positives and negatives per HPI.  Past Medical History:  Diagnosis Date  . Allergy   . Carotid artery occlusion   . Colon polyps   . Depression   . Fall from slipping on ice Feb. 2015  . Heart murmur   . Hypertension   . Menopause   . Pneumonia   . Positive TB test   . Sore throat   . Thyroid disease   . Varicose veins     Past Surgical History:  Procedure Laterality Date  . ABDOMINAL HYSTERECTOMY    . APPENDECTOMY    . BILATERAL SALPINGOOPHORECTOMY  1964  . CESAREAN SECTION    . EYE SURGERY Right March 2016   Cataract  . TONSILLECTOMY      Family History  Problem Relation Age of Onset  . Heart disease Mother        After age 53  . Hypertension Mother   . Deep vein thrombosis Mother   . Heart attack Mother   . Kidney disease Father   . Alcohol abuse Brother   . Heart disease Brother   . Cancer Brother        Lung cancer  . Cancer Brother        kidney cancer  . Varicose Veins Sister   . Heart disease Sister        After age 22  . Heart attack Sister   .  Heart disease Sister        After age 50  . Other Other        epilepsy  . Hypertension Other   . Cancer Paternal Grandmother        breast cancer    SOCIAL HX: widowed,  Lives alone   Current Outpatient Medications:  .  amLODipine (NORVASC) 5 MG tablet, Take 1 tablet (5 mg total) by mouth daily., Disp: 90 tablet, Rfl: 1 .  aspirin EC 81 MG tablet, Take 81 mg by mouth daily.  , Disp: , Rfl:  .  cloNIDine (CATAPRES) 0.1 MG tablet, Take 1 tablet (0.1 mg total) by mouth 2 (two) times daily., Disp: 60 tablet, Rfl: 11 .  dorzolamide (TRUSOPT) 2 % ophthalmic solution, PLACE 1 DROP INTO BOTH EYES 2 (TWO) TIMES DAILY., Disp: , Rfl: 12 .  ketoconazole (NIZORAL) 2 % cream, APPLY TO FEET, TOE WEBS, AND TOENAILS AT BEDTIME, Disp: , Rfl:  .   levothyroxine (SYNTHROID) 75 MCG tablet, TAKE 1 TABLET BY MOUTH EVERY DAY, Disp: 90 tablet, Rfl: 1 .  losartan (COZAAR) 100 MG tablet, Take 1 tablet (100 mg total) by mouth daily., Disp: 90 tablet, Rfl: 1 .  Multiple Vitamins-Minerals (PRESERVISION AREDS 2) CAPS, Take 2 capsules by mouth 2 (two) times daily., Disp: , Rfl:  .  torsemide (DEMADEX) 5 MG tablet, 1 tablet by mouth daily PRN leg swelling., Disp: 30 tablet, Rfl: 5 .  hydrochlorothiazide (HYDRODIURIL) 25 MG tablet, Take 1 tablet (25 mg total) by mouth daily., Disp: 90 tablet, Rfl: 0 .  triamcinolone cream (KENALOG) 0.1 %, Apply 1 application topically 2 (two) times daily. To left ankle, Disp: 30 g, Rfl: 0  EXAM:  General impression: alert, cooperative and articulate.  No signs of being in distress  Lungs: speech is fluent sentence length suggests that patient is not short of breath and not punctuated by cough, sneezing or sniffing. Marland Kitchen   Psych: affect normal.  speech is articulate and non pressured .  Denies suicidal thoughts   ASSESSMENT AND PLAN:  Discussed the following assessment and plan:  Venous ulcer of lower leg without varicose veins (HCC)  Venous ulcer of lower leg without varicose veins ([place]) She has a persistent ulcer on her calf that was treated with antibiotics with moderate improvement in the erythema .  She may be developing a second ulcer based on her report of increased pain in the ankle and needs to be evaluated in person     I discussed the assessment and treatment plan with the patient. The patient was provided an opportunity to ask questions and all were answered. The patient agreed with the plan and demonstrated an understanding of the instructions.   The patient was advised to call back or seek an in-person evaluation if the symptoms worsen or if the condition fails to improve as anticipated.  I provided 20 minutes of non-face-to-face time during this encounter.   Crecencio Mc, MD

## 2019-01-30 ENCOUNTER — Encounter: Payer: Self-pay | Admitting: Internal Medicine

## 2019-01-30 ENCOUNTER — Ambulatory Visit (INDEPENDENT_AMBULATORY_CARE_PROVIDER_SITE_OTHER): Payer: Medicare Other | Admitting: Internal Medicine

## 2019-01-30 VITALS — BP 170/66 | HR 75 | Temp 97.7°F | Resp 15 | Ht 61.0 in | Wt 120.8 lb

## 2019-01-30 DIAGNOSIS — I872 Venous insufficiency (chronic) (peripheral): Secondary | ICD-10-CM | POA: Insufficient documentation

## 2019-01-30 DIAGNOSIS — L97909 Non-pressure chronic ulcer of unspecified part of unspecified lower leg with unspecified severity: Secondary | ICD-10-CM | POA: Diagnosis not present

## 2019-01-30 DIAGNOSIS — I83023 Varicose veins of left lower extremity with ulcer of ankle: Secondary | ICD-10-CM | POA: Diagnosis not present

## 2019-01-30 DIAGNOSIS — L97329 Non-pressure chronic ulcer of left ankle with unspecified severity: Secondary | ICD-10-CM

## 2019-01-30 MED ORDER — TRIAMCINOLONE ACETONIDE 0.1 % EX CREA: 1.0000 "application " | TOPICAL_CREAM | Freq: Two times a day (BID) | CUTANEOUS | 0 refills | Status: DC

## 2019-01-30 NOTE — Progress Notes (Signed)
Subjective:  [name], female    DOB: 07/19/1925  Age: 85 y.o. MRN: [id number]  CC: The primary encounter diagnosis was Venous ulcer of ankle, left ([place]). A diagnosis of Venous ulcer of lower leg without varicose veins (HCC) was also pertinent to this visit.  HPI Deanna White presents for evaluation of painful left ankle.   She has venous insufficiency and has had a  slow to heal ulcer on the posterior left calf.  She has a new painful nodule on her lateral malleolus that is tender to touch  She has difficulty sleeping due to leg pain that occurs with even the lightest pressure on the ankle and  the enitre lower half of her leg excluding the heel. He pain is relieved by walking   Outpatient Medications Prior to Visit  Medication Sig Dispense Refill   amLODipine (NORVASC) 5 MG tablet Take 1 tablet (5 mg total) by mouth daily. 90 tablet 1   aspirin EC 81 MG tablet Take 81 mg by mouth daily.       cloNIDine (CATAPRES) 0.1 MG tablet Take 1 tablet (0.1 mg total) by mouth 2 (two) times daily. 60 tablet 11   dorzolamide (TRUSOPT) 2 % ophthalmic solution PLACE 1 DROP INTO BOTH EYES 2 (TWO) TIMES DAILY.  12   hydrochlorothiazide (HYDRODIURIL) 25 MG tablet Take 1 tablet (25 mg total) by mouth daily. 90 tablet 0   ketoconazole (NIZORAL) 2 % cream APPLY TO FEET, TOE WEBS, AND TOENAILS AT BEDTIME     levothyroxine (SYNTHROID) 75 MCG tablet TAKE 1 TABLET BY MOUTH EVERY DAY 90 tablet 1   losartan (COZAAR) 100 MG tablet Take 1 tablet (100 mg total) by mouth daily. 90 tablet 1   Multiple Vitamins-Minerals (PRESERVISION AREDS 2) CAPS Take 2 capsules by mouth 2 (two) times daily.     torsemide (DEMADEX) 5 MG tablet 1 tablet by mouth daily PRN leg swelling. 30 tablet 5   No facility-administered medications prior to visit.     Review of Systems;  Patient denies headache, fevers, malaise, unintentional weight loss, skin rash, eye pain, sinus congestion and sinus pain, sore  throat, dysphagia,  hemoptysis , cough, dyspnea, wheezing, chest pain, palpitations, orthopnea, edema, abdominal pain, nausea, melena, diarrhea, constipation, flank pain, dysuria, hematuria, urinary  Frequency, nocturia, numbness, tingling, seizures,  Focal weakness, Loss of consciousness,  Tremor, insomnia, depression, anxiety, and suicidal ideation.      Objective:  BP (!) 170/66 (BP Location: Left Arm, Patient Position: Sitting, Cuff Size: Normal)    Pulse 75    Temp 97.7 F (36.5 C) (Oral)    Resp 15    Ht 5\' 1"  (1.549 m)    Wt 120 lb 12.8 oz (54.8 kg)    SpO2 96%    BMI 22.82 kg/m   BP Readings from Last 3 Encounters:  01/30/19 (!) 170/66  10/18/18 (!) 160/66  09/24/18 (!) 152/62    Wt Readings from Last 3 Encounters:  01/30/19 120 lb 12.8 oz (54.8 kg)  10/18/18 124 lb 6.4 oz (56.4 kg)  09/24/18 123 lb 6.4 oz (56 kg)    General appearance: alert, cooperative and appears stated age  Neck: no adenopathy, no carotid bruit, supple, symmetrical, trachea midline and thyroid not enlarged, symmetric, no tenderness/mass/nodules Back: symmetric, no curvature. ROM normal. No CVA tenderness. Lungs: clear to auscultation bilaterally Heart: regular rate and rhythm, S1, S2 normal, no murmur, click, rub or gallop Abdomen: soft, non-tender; bowel sounds normal; no  masses,  no organomegaly Pulses: 2+ and symmetric Skin: mild venous stasis changes.  Two small hyperkeratotic papules noted on left lateral malleolus, without erythema or drainage.   left posteriori calf with dime sized ulcer with slough covering the granulating bed Lymph nodes: Cervical, supraclavicular, and axillary nodes normal.  No results found for: HGBA1C  Lab Results  Component Value Date   CREATININE 1.0 12/04/2018   CREATININE 0.91 09/23/2018   CREATININE 0.84 09/17/2018    Lab Results  Component Value Date   WBC 6.9 09/17/2018   HGB 11.2 (L) 09/17/2018   HCT 33.2 (L) 09/17/2018   PLT 235.0 09/17/2018   GLUCOSE  116 (H) 09/23/2018   CHOL 178 03/01/2016   TRIG 73.0 03/01/2016   HDL 64.20 03/01/2016   LDLDIRECT 76.0 08/31/2016   LDLCALC 100 (H) 03/01/2016   ALT 14 04/15/2018   AST 23 04/15/2018   NA 137 12/04/2018   K 4.2 12/04/2018   CL 99 09/23/2018   CREATININE 1.0 12/04/2018   BUN 16 12/04/2018   CO2 25 09/23/2018   TSH 2.80 09/17/2018   MICROALBUR <0.7 10/16/2017    US Venous Img Lower Unilateral Left  Result Date: 10/09/2018 CLINICAL DATA:  85 year-old female with left calf tenderness and swelling for 1 month. EXAM: LEFT LOWER EXTREMITY VENOUS DOPPLER ULTRASOUND TECHNIQUE: Gray-scale sonography with graded compression, as well as color Doppler and duplex ultrasound were performed to evaluate the lower extremity deep venous systems from the level of the common femoral vein and including the common femoral, femoral, profunda femoral, popliteal and calf veins including the posterior tibial, peroneal and gastrocnemius veins when visible. The superficial great saphenous vein was also interrogated. Spectral Doppler was utilized to evaluate flow at rest and with distal augmentation maneuvers in the common femoral, femoral and popliteal veins. COMPARISON:  None. FINDINGS: Contralateral Common Femoral Vein: Respiratory phasicity is normal and symmetric with the symptomatic side. No evidence of thrombus. Normal compressibility. Common Femoral Vein: No evidence of thrombus. Normal compressibility, respiratory phasicity and response to augmentation. Saphenofemoral Junction: No evidence of thrombus. Normal compressibility and flow on color Doppler imaging. Profunda Femoral Vein: No evidence of thrombus. Normal compressibility and flow on color Doppler imaging. Femoral Vein: No evidence of thrombus. Normal compressibility, respiratory phasicity and response to augmentation. Popliteal Vein: No evidence of thrombus. Normal compressibility, respiratory phasicity and response to augmentation. Calf Veins: No evidence of  thrombus. Normal compressibility and flow on color Doppler imaging. Superficial Great Saphenous Vein: No evidence of thrombus. Normal compressibility. Venous Reflux:  None. Other Findings:  None. IMPRESSION: No evidence of deep venous thrombosis. Electronically Signed   By: Jacqulynn Cadet M.D.   On: 10/09/2018 16:46    Assessment & Plan:   Problem List Items Addressed This Visit    Venous ulcer of lower leg without varicose veins ([place])    Referring to wound center for management which will likely include debridement of slough filled ulcer on calf.  Treating lateral malleolar pain with leg elevation using ankle floaters and triamcinolone cream for the inflammation       Other Visit Diagnoses    Venous ulcer of ankle, left ([place])    -  Primary   Relevant Orders   Ambulatory referral to Pain Clinic   DME Other see comment      I am having Van Clines. Powless start on triamcinolone cream. I am also having her maintain her aspirin EC, PreserVision AREDS 2, dorzolamide, cloNIDine, losartan, torsemide, amLODipine, levothyroxine, ketoconazole, and  hydrochlorothiazide.  Meds ordered this encounter  Medications   triamcinolone cream (KENALOG) 0.1 %    Sig: Apply 1 application topically 2 (two) times daily. To left ankle    Dispense:  30 g    Refill:  0    There are no discontinued medications.  Follow-up: No follow-ups on file.   Crecencio Mc, MD

## 2019-01-30 NOTE — Assessment & Plan Note (Signed)
She has a persistent ulcer on her calf that was treated with antibiotics with moderate improvement in the erythema .  She may be developing a second ulcer based on her report of increased pain in the ankle and needs to be evaluated in person

## 2019-01-30 NOTE — Patient Instructions (Addendum)
The "roll" that your son I n law  Gave you is TOO HARD and may cause another ulcer   I am referring you to the [facility] to address both spots on your legs   I am prescribing a steroid ointment to use on the bottom spot, NOT THE TOP ONE

## 2019-02-01 NOTE — Assessment & Plan Note (Signed)
Referring to wound center for management which will likely include debridement of slough filled ulcer on calf.  Treating lateral malleolar pain with leg elevation using ankle floaters and triamcinolone cream for the inflammation

## 2019-02-04 ENCOUNTER — Telehealth: Payer: Self-pay

## 2019-02-04 NOTE — Telephone Encounter (Signed)
Melissa do you know if this referral has been sent to the wound center?

## 2019-02-04 NOTE — Telephone Encounter (Signed)
Copied from [place] [id number]. Topic: General - Other >> Feb 04, 2019 11:31 AM Carolyn Stare wrote:   Pt call to say she has not heard from the [facility] is saying pain clinic ,need to clarify please

## 2019-02-05 NOTE — Telephone Encounter (Signed)
I just corrected the referral-it was sent to pain management-not wound. They will call her today to get her scheduled. Lenna Sciara

## 2019-02-12 ENCOUNTER — Encounter: Payer: Medicare Other | Attending: Internal Medicine | Admitting: Internal Medicine

## 2019-02-12 ENCOUNTER — Other Ambulatory Visit: Payer: Self-pay

## 2019-02-12 DIAGNOSIS — I70242 Atherosclerosis of native arteries of left leg with ulceration of calf: Secondary | ICD-10-CM | POA: Diagnosis not present

## 2019-02-12 DIAGNOSIS — Z8249 Family history of ischemic heart disease and other diseases of the circulatory system: Secondary | ICD-10-CM | POA: Diagnosis not present

## 2019-02-12 DIAGNOSIS — H409 Unspecified glaucoma: Secondary | ICD-10-CM | POA: Diagnosis not present

## 2019-02-12 DIAGNOSIS — N183 Chronic kidney disease, stage 3 (moderate): Secondary | ICD-10-CM | POA: Diagnosis not present

## 2019-02-12 DIAGNOSIS — I129 Hypertensive chronic kidney disease with stage 1 through stage 4 chronic kidney disease, or unspecified chronic kidney disease: Secondary | ICD-10-CM | POA: Diagnosis not present

## 2019-02-12 DIAGNOSIS — I87332 Chronic venous hypertension (idiopathic) with ulcer and inflammation of left lower extremity: Secondary | ICD-10-CM | POA: Diagnosis not present

## 2019-02-12 DIAGNOSIS — L97221 Non-pressure chronic ulcer of left calf limited to breakdown of skin: Secondary | ICD-10-CM | POA: Insufficient documentation

## 2019-02-12 NOTE — Progress Notes (Signed)
AVEENA, BARI ([id number]) Visit Report for 02/12/2019 Abuse/Suicide Risk Screen Details Patient Name: Deanna White, Deanna White Date of Service: 02/12/2019 10:45 AM Medical Record Number: [id number] Patient Account Number: [id number] Date of Birth/Sex: 1925/01/07 (85 y.o. F) Treating RN: Harold Barban Primary Care Trayton Szabo: Deborra Medina Other Clinician: Referring Alter Moss: Deborra Medina Treating Domanique Huesman/Extender: Tito Dine in Treatment: 0 Abuse/Suicide Risk Screen Items Answer ABUSE RISK SCREEN: Has anyone close to you tried to hurt or harm you recentlyo No Do you feel uncomfortable with anyone in your familyo No Has anyone forced you do things that you didnot want to doo No Electronic Signature(s) Signed: 02/12/2019 4:23:05 PM By: Harold Barban Entered By: Harold Barban on 02/12/2019 10:57:55 [name] ([id number]) -------------------------------------------------------------------------------- Activities of Daily Living Details Patient Name: [name] Date of Service: 02/12/2019 10:45 AM Medical Record Number: [id number] Patient Account Number: [id number] Date of Birth/Sex: 01-18-1925 (85 y.o. F) Treating RN: Harold Barban Primary Care Stephinie Battisti: Deborra Medina Other Clinician: Referring Jamiracle Avants: Deborra Medina Treating Leonidas Boateng/Extender: Tito Dine in Treatment: 0 Activities of Daily Living Items Answer Activities of Daily Living (Please select one for each item) Drive Automobile Completely Able Take Medications Completely Able Use Telephone Completely Able Care for Appearance Completely Able Use Toilet Completely Able Bath / Shower Completely Able Dress Self Completely Able Feed Self Completely Able Walk Completely Able Get In / Out Bed Completely Able Housework Completely Able Prepare Meals Completely Able Handle Money Completely Able Shop for Self Completely Able Electronic Signature(s) Signed: 02/12/2019 4:23:05 PM By: Harold Barban Entered By: Harold Barban on 02/12/2019 10:58:09 [name] ([id number]) -------------------------------------------------------------------------------- Education Screening Details Patient Name: [name] Date of Service: 02/12/2019 10:45 AM Medical Record Number: [id number] Patient Account Number: [id number] Date of Birth/Sex: 1924/11/11 (85 y.o. F) Treating RN: Harold Barban Primary Care Kemia Wendel: Deborra Medina Other Clinician: Referring Sharene Krikorian: Deborra Medina Treating Sable Knoles/Extender: Tito Dine in Treatment: 0 Primary Learner Assessed: Patient Learning Preferences/Education Level/Primary Language Learning Preference: Explanation Highest Education Level: High School Preferred Language: English Cognitive Barrier Language Barrier: No Translator Needed: No Memory Deficit: No Emotional Barrier: No Cultural/Religious Beliefs Affecting Medical Care: No Physical Barrier Impaired Vision: No Impaired Hearing: No Decreased Hand dexterity: No Knowledge/Comprehension Knowledge Level: High Comprehension Level: High Ability to understand written High instructions: Ability to understand verbal High instructions: Motivation Anxiety Level: Calm Cooperation: Cooperative Education Importance: Acknowledges Need Interest in Health Problems: Asks Questions Perception: Coherent Willingness to Engage in Self- High Management Activities: Readiness to Engage in Self- High Management Activities: Electronic Signature(s) Signed: 02/12/2019 4:23:05 PM By: Harold Barban Entered By: Harold Barban on 02/12/2019 10:58:32 [name] ([id number]) -------------------------------------------------------------------------------- Fall Risk Assessment Details Patient Name: [name] Date of Service: 02/12/2019 10:45 AM Medical Record Number: [id number] Patient Account Number: [id number] Date of Birth/Sex: [birth year] (85 y.o. F) Treating RN:  Harold Barban Primary Care Alveda Vanhorne: Deborra Medina Other Clinician: Referring Kyron Schlitt: Deborra Medina Treating Khamya Topp/Extender: Tito Dine in Treatment: 0 Fall Risk Assessment Items Have you had 2 or more falls in the last 12 monthso 0 No Have you had any fall that resulted in injury in the last 12 monthso 0 No FALLS RISK SCREEN History of falling - immediate or within 3 months 0 No Secondary diagnosis (Do you have 2 or more medical diagnoseso) 0 No Ambulatory aid None/bed rest/wheelchair/nurse 0 No Crutches/cane/walker 0 No Furniture 0 No Intravenous therapy Access/Saline/Heparin Lock 0 No Gait/Transferring Normal/ bed rest/ wheelchair 0 No Weak (short  steps with or without shuffle, stooped but able to lift head while 0 No walking, may seek support from furniture) Impaired (short steps with shuffle, may have difficulty arising from chair, head 0 No down, impaired balance) Mental Status Oriented to own ability 0 No Electronic Signature(s) Signed: 02/12/2019 4:23:05 PM By: Harold Barban Entered By: Harold Barban on 02/12/2019 10:58:44 [name] ([id number]) -------------------------------------------------------------------------------- Foot Assessment Details Patient Name: [name] Date of Service: 02/12/2019 10:45 AM Medical Record Number: [id number] Patient Account Number: [id number] Date of Birth/Sex: [birth year] (85 y.o. F) Treating RN: Harold Barban Primary Care Manasa Spease: Deborra Medina Other Clinician: Referring Angeles Zehner: Deborra Medina Treating Daralyn Bert/Extender: Tito Dine in Treatment: 0 Foot Assessment Items Site Locations + = Sensation present, - = Sensation absent, C = Callus, U = Ulcer R = Redness, W = Warmth, M = Maceration, PU = Pre-ulcerative lesion F = Fissure, S = Swelling, D = Dryness Assessment Right: Left: Other Deformity: No No Prior Foot Ulcer: No No Prior Amputation: No No Charcot Joint: No  No Ambulatory Status: Ambulatory Without Help Gait: Steady Electronic Signature(s) Signed: 02/12/2019 4:23:05 PM By: Harold Barban Entered By: Harold Barban on 02/12/2019 11:00:20 [name] ([id number]) -------------------------------------------------------------------------------- Nutrition Risk Screening Details Patient Name: [name] Date of Service: 02/12/2019 10:45 AM Medical Record Number: [id number] Patient Account Number: [id number] Date of Birth/Sex: Jan 09, 1925 (85 y.o. F) Treating RN: Harold Barban Primary Care Kishana Battey: Deborra Medina Other Clinician: Referring Derelle Cockrell: Deborra Medina Treating Kalani Baray/Extender: Tito Dine in Treatment: 0 Height (in): 62 Weight (lbs): 118 Body Mass Index (BMI): 21.6 Nutrition Risk Screening Items Score Screening NUTRITION RISK SCREEN: I have an illness or condition that made me change the kind and/or amount of 0 No food I eat I eat fewer than two meals per day 0 No I eat few fruits and vegetables, or milk products 0 No I have three or more drinks of beer, liquor or wine almost every day 0 No I have tooth or mouth problems that make it hard for me to eat 0 No I don't always have enough money to buy the food I need 0 No I eat alone most of the time 0 No I take three or more different prescribed or over-the-counter drugs a day 1 Yes Without wanting to, I have lost or gained 10 pounds in the last six months 0 No I am not always physically able to shop, cook and/or feed myself 0 No Nutrition Protocols Good Risk Protocol Moderate Risk Protocol High Risk Proctocol Risk Level: Good Risk Score: 1 Electronic Signature(s) Signed: 02/12/2019 4:23:05 PM By: Harold Barban Entered By: Harold Barban on 02/12/2019 10:58:59

## 2019-02-13 NOTE — Progress Notes (Signed)
Deanna White, Deanna White ([id number]) Visit Report for 02/12/2019 Chief Complaint Document Details Patient Name: Deanna White, Deanna White Date of Service: 02/12/2019 10:45 AM Medical Record Number: [id number] Patient Account Number: [id number] Date of Birth/Sex: Apr 05, 1925 (85 y.o. F) Treating RN: Cornell Barman Primary Care Provider: Deborra Medina Other Clinician: Referring Provider: Deborra Medina Treating Provider/Extender: Tito Dine in Treatment: 0 Information Obtained from: Patient Chief Complaint 02/12/2019; patient is here for review of a wound on her left posterior calf Electronic Signature(s) Signed: 02/12/2019 4:30:12 PM By: Linton Ham MD Entered By: Linton Ham on 02/12/2019 12:03:11 Deanna White ([id number]) -------------------------------------------------------------------------------- Debridement Details Patient Name: Deanna White Date of Service: 02/12/2019 10:45 AM Medical Record Number: [id number] Patient Account Number: [id number] Date of Birth/Sex: 28-Sep-1925 (85 y.o. F) Treating RN: Cornell Barman Primary Care Provider: Deborra Medina Other Clinician: Referring Provider: Deborra Medina Treating Provider/Extender: Tito Dine in Treatment: 0 Debridement Performed for Wound #1 Left,Posterior Lower Leg Assessment: Performed By: Physician Ricard Dillon, MD Debridement Type: Debridement Severity of Tissue Pre Fat layer exposed Debridement: Level of Consciousness (Pre- Awake and Alert procedure): Pre-procedure Verification/Time Yes - 11:25 Out Taken: Start Time: 11:25 Pain Control: Lidocaine Total Area Debrided (L x W): 0.9 (cm) x 0.5 (cm) = 0.45 (cm) Tissue and other material Viable, Non-Viable, Slough, Subcutaneous, Slough debrided: Level: Skin/Subcutaneous Tissue Debridement Description: Excisional Instrument: Curette Bleeding: Moderate Hemostasis Achieved: Pressure End Time: 11:28 Response to Treatment: Procedure was tolerated  well Level of Consciousness Awake and Alert (Post-procedure): Post Debridement Measurements of Total Wound Length: (cm) 0.9 Width: (cm) 0.5 Depth: (cm) 0.1 Volume: (cm) 0.035 Character of Wound/Ulcer Post Debridement: Requires Further Debridement Severity of Tissue Post Debridement: Fat layer exposed Post Procedure Diagnosis Same as Pre-procedure Electronic Signature(s) Signed: 02/12/2019 4:30:12 PM By: Linton Ham MD Signed: 02/13/2019 10:03:11 AM By: Gretta Cool, BSN, RN, CWS, Kim RN, BSN Entered By: Gretta Cool, BSN, RN, CWS, Kim on 02/12/2019 11:30:28 Deanna White, Deanna White ([id number]) -------------------------------------------------------------------------------- HPI Details Patient Name: Deanna White Date of Service: 02/12/2019 10:45 AM Medical Record Number: [id number] Patient Account Number: [id number] Date of Birth/Sex: October 31, 1924 (85 y.o. F) Treating RN: Cornell Barman Primary Care Provider: Deborra Medina Other Clinician: Referring Provider: Deborra Medina Treating Provider/Extender: Tito Dine in Treatment: 0 History of Present Illness HPI Description: 02/12/2019 Admission This is a very functional 85 year old woman who drove herself to our clinic today. She is concerned about a wound on her left posterior calf. This apparently started 4 to 6 weeks ago at which time she developed increasing swelling and redness in her lower leg. She saw her primary doctor in April who thought this was a venous insufficiency ulcer with some degree of good venous stasis. She did receive antibiotics as well as triamcinolone cream for the inflammation. The patient complains of a lot of pain both in the wound and also in the lateral part of her ankle which is especially worse at night and relieved by standing or walking. She is a nondiabetic and non-smoker. The patient had a venous Doppler on 10/09/2018 that was negative for a DVT and no reflux noted in the greater saphenous vein. No comment on  superficial vein thrombosis. Past medical history includes chronic kidney disease stage III, hypothyroidism, glaucoma, history of venous ulcer, hypertension ABIs in our clinic were not obtainable on the left even with the Doppler. 0.56 on the right Electronic Signature(s) Signed: 02/12/2019 4:30:12 PM By: Linton Ham MD Entered By: Linton Ham on 02/12/2019 12:13:28 Deanna White ([id number]) --------------------------------------------------------------------------------  Physical Exam Details Patient Name: Deanna White, Deanna White Date of Service: 02/12/2019 10:45 AM Medical Record Number: [id number] Patient Account Number: [id number] Date of Birth/Sex: October 25, 1924 (85 y.o. F) Treating RN: Cornell Barman Primary Care Provider: Deborra Medina Other Clinician: Referring Provider: Deborra Medina Treating Provider/Extender: Ricard Dillon Weeks in Treatment: 0 Constitutional Respirations regular, non-labored and within target range.. Temperature is normal and within the target range for the patient.Marland Kitchen appears in no distress. Eyes Conjunctivae clear. No discharge. Respiratory Respiratory effort is easy and symmetric bilaterally. Rate is normal at rest and on room air.. Bilateral breath sounds are clear and equal in all lobes with no wheezes, rales or rhonchi.. Cardiovascular I could not feel her popliteal pulses bilaterally. Faint femoral pulses. Pedal pulses absent bilaterally.. Mild edema in the left calf and ankle which is pitting. She has clear evidence of venous hypertension with dilated small veins in her feet.. Lymphatic None palpable in the popliteal or inguinal area. Integumentary (Hair, Skin) No primary skin issue is seen. Psychiatric No evidence of depression, anxiety, or agitation. Calm, cooperative, and communicative. Appropriate interactions and affect.. Notes Wound exam; area in question is a small area in her left posterior mid calf. Tightly adherent debris removed with a #3  curette. Hemostasis with direct pressure. She tolerated this marginally. There is no surrounding erythema. She has a raised callused area on the lateral malleolus area. This is not open but this is where she describes most of her pain. She has no palpable pulses in her feet bilaterally. Very prolonged capillary refill time if you believe in this test. Electronic Signature(s) Signed: 02/12/2019 4:30:12 PM By: Linton Ham MD Entered By: Linton Ham on 02/12/2019 12:11:30 Deanna White ([id number]) -------------------------------------------------------------------------------- Physician Orders Details Patient Name: Deanna White Date of Service: 02/12/2019 10:45 AM Medical Record Number: [id number] Patient Account Number: [id number] Date of Birth/Sex: Sep 19, 1925 (85 y.o. F) Treating RN: Cornell Barman Primary Care Provider: Deborra Medina Other Clinician: Referring Provider: Deborra Medina Treating Provider/Extender: Tito Dine in Treatment: 0 Verbal / Phone Orders: No Diagnosis Coding Wound Cleansing Wound #1 Left,Posterior Lower Leg o Clean wound with Normal Saline. o May Shower, gently pat wound dry prior to applying new dressing. Anesthetic (add to Medication List) Wound #1 Left,Posterior Lower Leg o Topical Lidocaine 4% cream applied to wound bed prior to debridement (In Clinic Only). o Benzocaine Topical Anesthetic Spray applied to wound bed prior to debridement (In Clinic Only). Primary Wound Dressing Wound #1 Left,Posterior Lower Leg o Silver Collagen Secondary Dressing Wound #1 Left,Posterior Lower Leg o Conform/Kerlix - Lightly to secure collagen in place. Dressing Change Frequency Wound #1 Left,Posterior Lower Leg o Change Dressing Monday, Wednesday, Friday Follow-up Appointments Wound #1 Left,Posterior Lower Leg o Return Appointment in 1 week. Edema Control Wound #1 Left,Posterior Lower Leg o Elevate legs to the level of the heart  and pump ankles as often as possible Services and Therapies o Arterial Studies- Bilateral - Non-healing wound, pain, Left and Right Electronic Signature(s) Signed: 02/12/2019 4:30:12 PM By: Linton Ham MD Signed: 02/13/2019 10:03:11 AM By: Gretta Cool, BSN, RN, CWS, Kim RN, BSN Entered By: Gretta Cool, BSN, RN, CWS, Kim on 02/12/2019 11:38:32 Deanna White, Deanna White ([id number]) -------------------------------------------------------------------------------- Problem List Details Patient Name: Deanna White Date of Service: 02/12/2019 10:45 AM Medical Record Number: [id number] Patient Account Number: [id number] Date of Birth/Sex: 1925/03/27 (85 y.o. F) Treating RN: Cornell Barman Primary Care Provider: Deborra Medina Other Clinician: Referring Provider: Deborra Medina Treating Provider/Extender: Tito Dine in Treatment:  0 Active Problems ICD-10 Evaluated Encounter Code Description Active Date Today Diagnosis L97.221 Non-pressure chronic ulcer of left calf limited to breakdown of 02/12/2019 No Yes skin I87.332 Chronic venous hypertension (idiopathic) with ulcer and 02/12/2019 No Yes inflammation of left lower extremity I70.242 Atherosclerosis of native arteries of left leg with ulceration of 02/12/2019 No Yes calf Inactive Problems Resolved Problems Electronic Signature(s) Signed: 02/12/2019 4:30:12 PM By: Linton Ham MD Entered By: Linton Ham on 02/12/2019 11:43:25 Deanna White ([id number]) -------------------------------------------------------------------------------- Progress Note Details Patient Name: Deanna White Date of Service: 02/12/2019 10:45 AM Medical Record Number: [id number] Patient Account Number: [id number] Date of Birth/Sex: [birth year] (85 y.o. F) Treating RN: Cornell Barman Primary Care Provider: Deborra Medina Other Clinician: Referring Provider: Deborra Medina Treating Provider/Extender: Tito Dine in Treatment: 0 Subjective Chief  Complaint Information obtained from Patient 02/12/2019; patient is here for review of a wound on her left posterior calf History of Present Illness (HPI) 02/12/2019 Admission This is a very functional 85 year old woman who drove herself to our clinic today. She is concerned about a wound on her left posterior calf. This apparently started 4 to 6 weeks ago at which time she developed increasing swelling and redness in her lower leg. She saw her primary doctor in April who thought this was a venous insufficiency ulcer with some degree of good venous stasis. She did receive antibiotics as well as triamcinolone cream for the inflammation. The patient complains of a lot of pain both in the wound and also in the lateral part of her ankle which is especially worse at night and relieved by standing or walking. She is a nondiabetic and non-smoker. The patient had a venous Doppler on 10/09/2018 that was negative for a DVT and no reflux noted in the greater saphenous vein. No comment on superficial vein thrombosis. Past medical history includes chronic kidney disease stage III, hypothyroidism, glaucoma, history of venous ulcer, hypertension ABIs in our clinic were not obtainable on the left even with the Doppler. 0.56 on the right Patient History Information obtained from Patient. Allergies No Known Allergies Family History Cancer - Paternal Grandparents, Heart Disease - Mother, Hypertension - Mother, Kidney Disease - Father, Seizures - Siblings, No family history of Diabetes, Hereditary Spherocytosis, Lung Disease, Stroke, Thyroid Problems, Tuberculosis. Social History Never smoker, Marital Status - Widowed, Alcohol Use - Never, Drug Use - No History, Caffeine Use - Daily - coffee/tea. Medical History Eyes Patient has history of Cataracts Denies history of Glaucoma, Optic Neuritis Ear/Nose/Mouth/Throat Denies history of Chronic sinus problems/congestion, Middle ear  problems Hematologic/Lymphatic Deanna White, Deanna White ([id number]) Denies history of Anemia, Hemophilia, Human Immunodeficiency Virus, Lymphedema, Sickle Cell Disease Respiratory Denies history of Aspiration, Asthma, Chronic Obstructive Pulmonary Disease (COPD), Pneumothorax, Tuberculosis Cardiovascular Patient has history of Hypertension Denies history of Angina, Arrhythmia, Congestive Heart Failure, Coronary Artery Disease, Deep Vein Thrombosis, Hypotension, Myocardial Infarction, Peripheral Arterial Disease, Peripheral Venous Disease, Phlebitis, Vasculitis Gastrointestinal Denies history of Cirrhosis , Colitis, Crohn s, Hepatitis A, Hepatitis B, Hepatitis C Endocrine Denies history of Type I Diabetes, Type II Diabetes Genitourinary Denies history of End Stage Renal Disease Immunological Denies history of Lupus Erythematosus, Raynaud s, Scleroderma Integumentary (Skin) Denies history of History of Burn, History of pressure wounds Musculoskeletal Denies history of Gout, Rheumatoid Arthritis, Osteoarthritis, Osteomyelitis Neurologic Denies history of Dementia, Neuropathy, Quadriplegia, Paraplegia, Seizure Disorder Oncologic Denies history of Received Chemotherapy, Received Radiation Psychiatric Denies history of Anorexia/bulimia, Confinement Anxiety Review of Systems (ROS) Constitutional Symptoms (General Health) Denies complaints or symptoms  of Fatigue, Fever, Chills, Marked Weight Change. Eyes Complains or has symptoms of Glasses / Contacts. Denies complaints or symptoms of Dry Eyes, Vision Changes. Ear/Nose/Mouth/Throat Denies complaints or symptoms of Difficult clearing ears, Sinusitis, HOH bilaterally Hematologic/Lymphatic Denies complaints or symptoms of Bleeding / Clotting Disorders, Human Immunodeficiency Virus. Respiratory Denies complaints or symptoms of Chronic or frequent coughs, Shortness of Breath. Cardiovascular Complains or has symptoms of LE edema - Left leg. Denies  complaints or symptoms of Chest pain. Gastrointestinal Denies complaints or symptoms of Frequent diarrhea, Nausea, Vomiting. Endocrine Denies complaints or symptoms of Hepatitis, Thyroid disease, Polydypsia (Excessive Thirst). Genitourinary Denies complaints or symptoms of Kidney failure/ Dialysis, Incontinence/dribbling. Immunological Denies complaints or symptoms of Hives, Itching. Integumentary (Skin) Complains or has symptoms of Wounds - 2, Swelling - Left lower leg. Denies complaints or symptoms of Bleeding or bruising tendency, Breakdown. Musculoskeletal Denies complaints or symptoms of Muscle Pain, Muscle Weakness. Neurologic Denies complaints or symptoms of Numbness/parasthesias, Focal/Weakness. Psychiatric Denies complaints or symptoms of Anxiety, Claustrophobia. Deanna White, Deanna White ([id number]) Objective Constitutional Respirations regular, non-labored and within target range.. Temperature is normal and within the target range for the patient.Marland Kitchen appears in no distress. Vitals Time Taken: 10:47 AM, Height: 62 in, Source: Stated, Weight: 118 lbs, Source: Stated, BMI: 21.6, Temperature: 98.2  F, Respiratory Rate: 16 breaths/min. Eyes Conjunctivae clear. No discharge. Respiratory Respiratory effort is easy and symmetric bilaterally. Rate is normal at rest and on room air.. Bilateral breath sounds are clear and equal in all lobes with no wheezes, rales or rhonchi.. Cardiovascular I could not feel her popliteal pulses bilaterally. Faint femoral pulses. Pedal pulses absent bilaterally.. Mild edema in the left calf and ankle which is pitting. She has clear evidence of venous hypertension with dilated small veins in her feet.. Lymphatic None palpable in the popliteal or inguinal area. Psychiatric No evidence of depression, anxiety, or agitation. Calm, cooperative, and communicative. Appropriate interactions and affect.. General Notes: Wound exam; area in question is a small area in  her left posterior mid calf. Tightly adherent debris removed with a #3 curette. Hemostasis with direct pressure. She tolerated this marginally. There is no surrounding erythema. She has a raised callused area on the lateral malleolus area. This is not open but this is where she describes most of her pain. She has no palpable pulses in her feet bilaterally. Very prolonged capillary refill time if you believe in this test. Integumentary (Hair, Skin) No primary skin issue is seen. Wound #1 status is Open. Original cause of wound was Gradually Appeared. The wound is located on the Left,Posterior Lower Leg. The wound measures 0.9cm length x 0.5cm width x 0.1cm depth; 0.353cm^2 area and 0.035cm^3 volume. There is Fat Layer (Subcutaneous Tissue) Exposed exposed. There is no tunneling or undermining noted. There is a medium amount of sanguinous drainage noted. The wound margin is flat and intact. There is no granulation within the wound bed. There is a large (67-100%) amount of necrotic tissue within the wound bed including Adherent Slough. The periwound skin appearance exhibited: Dry/Scaly. The periwound has tenderness on palpation. Assessment Active Problems ICD-10 Deanna White, Deanna White ([id number]) Non-pressure chronic ulcer of left calf limited to breakdown of skin Chronic venous hypertension (idiopathic) with ulcer and inflammation of left lower extremity Atherosclerosis of native arteries of left leg with ulceration of calf Procedures Wound #1 Pre-procedure diagnosis of Wound #1 is a Venous Leg Ulcer located on the Left,Posterior Lower Leg .Severity of Tissue Pre Debridement is: Fat layer exposed. There was a  Excisional Skin/Subcutaneous Tissue Debridement with a total area of 0.45 sq cm performed by Ricard Dillon, MD. With the following instrument(s): Curette to remove Viable and Non-Viable tissue/material. Material removed includes Subcutaneous Tissue and Slough and after achieving pain control  using Lidocaine. No specimens were taken. A time out was conducted at 11:25, prior to the start of the procedure. A Moderate amount of bleeding was controlled with Pressure. The procedure was tolerated well. Post Debridement Measurements: 0.9cm length x 0.5cm width x 0.1cm depth; 0.035cm^3 volume. Character of Wound/Ulcer Post Debridement requires further debridement. Severity of Tissue Post Debridement is: Fat layer exposed. Post procedure Diagnosis Wound #1: Same as Pre-Procedure Plan Wound Cleansing: Wound #1 Left,Posterior Lower Leg: Clean wound with Normal Saline. May Shower, gently pat wound dry prior to applying new dressing. Anesthetic (add to Medication List): Wound #1 Left,Posterior Lower Leg: Topical Lidocaine 4% cream applied to wound bed prior to debridement (In Clinic Only). Benzocaine Topical Anesthetic Spray applied to wound bed prior to debridement (In Clinic Only). Primary Wound Dressing: Wound #1 Left,Posterior Lower Leg: Silver Collagen Secondary Dressing: Wound #1 Left,Posterior Lower Leg: Conform/Kerlix - Lightly to secure collagen in place. Dressing Change Frequency: Wound #1 Left,Posterior Lower Leg: Change Dressing Monday, Wednesday, Friday Follow-up Appointments: Wound #1 Left,Posterior Lower Leg: Return Appointment in 1 week. Edema Control: Wound #1 Left,Posterior Lower Leg: Elevate legs to the level of the heart and pump ankles as often as possible Services and Therapies ordered were: Arterial Studies- Bilateral - Non-healing wound, pain, Left and Right Deanna White, Deanna S. ([id number]) 1. Debridement done of the calf gave somewhat of a better surface 2. I am going to use silver collagen and conform over this area. I was reluctant to put compression on this leg given the arterial findings 3. I think this patient has a combination of chronic venous insufficiency but also probably chronic peripheral arterial disease. In fact I wonder if most of the pain she  describes as claudication. We have ordered noninvasive arterial studies. 4. I would like to wrap her leg more securely with at least Kerlix Coban but I was again concerned about the complete absence of peripheral pulses and audible Doppler signals 5. The patient will change this herself, she is not eligible for home health Electronic Signature(s) Signed: 02/12/2019 4:30:12 PM By: Linton Ham MD Entered By: Linton Ham on 02/12/2019 12:15:39 Deanna White ([id number]) -------------------------------------------------------------------------------- ROS/PFSH Details Patient Name: Deanna White Date of Service: 02/12/2019 10:45 AM Medical Record Number: [id number] Patient Account Number: [id number] Date of Birth/Sex: 1925-01-17 (85 y.o. F) Treating RN: Harold Barban Primary Care Provider: Deborra Medina Other Clinician: Referring Provider: Deborra Medina Treating Provider/Extender: Tito Dine in Treatment: 0 Information Obtained From Patient Constitutional Symptoms (General Health) Complaints and Symptoms: Negative for: Fatigue; Fever; Chills; Marked Weight Change Eyes Complaints and Symptoms: Positive for: Glasses / Contacts Negative for: Dry Eyes; Vision Changes Medical History: Positive for: Cataracts Negative for: Glaucoma; Optic Neuritis Ear/Nose/Mouth/Throat Complaints and Symptoms: Negative for: Difficult clearing ears; Sinusitis Review of System Notes: HOH bilaterally Medical History: Negative for: Chronic sinus problems/congestion; Middle ear problems Hematologic/Lymphatic Complaints and Symptoms: Negative for: Bleeding / Clotting Disorders; Human Immunodeficiency Virus Medical History: Negative for: Anemia; Hemophilia; Human Immunodeficiency Virus; Lymphedema; Sickle Cell Disease Respiratory Complaints and Symptoms: Negative for: Chronic or frequent coughs; Shortness of Breath Medical History: Negative for: Aspiration; Asthma; Chronic  Obstructive Pulmonary Disease (COPD); Pneumothorax; Tuberculosis Cardiovascular Complaints and Symptoms: Positive for: LE edema - Left leg Negative for: Chest  pain Medical History: Deanna White, Deanna White ([id number]) Positive for: Hypertension Negative for: Angina; Arrhythmia; Congestive Heart Failure; Coronary Artery Disease; Deep Vein Thrombosis; Hypotension; Myocardial Infarction; Peripheral Arterial Disease; Peripheral Venous Disease; Phlebitis; Vasculitis Gastrointestinal Complaints and Symptoms: Negative for: Frequent diarrhea; Nausea; Vomiting Medical History: Negative for: Cirrhosis ; Colitis; Crohnos; Hepatitis A; Hepatitis B; Hepatitis C Endocrine Complaints and Symptoms: Negative for: Hepatitis; Thyroid disease; Polydypsia (Excessive Thirst) Medical History: Negative for: Type I Diabetes; Type II Diabetes Genitourinary Complaints and Symptoms: Negative for: Kidney failure/ Dialysis; Incontinence/dribbling Medical History: Negative for: End Stage Renal Disease Immunological Complaints and Symptoms: Negative for: Hives; Itching Medical History: Negative for: Lupus Erythematosus; Raynaudos; Scleroderma Integumentary (Skin) Complaints and Symptoms: Positive for: Wounds - 2; Swelling - Left lower leg Negative for: Bleeding or bruising tendency; Breakdown Medical History: Negative for: History of Burn; History of pressure wounds Musculoskeletal Complaints and Symptoms: Negative for: Muscle Pain; Muscle Weakness Medical History: Negative for: Gout; Rheumatoid Arthritis; Osteoarthritis; Osteomyelitis Neurologic Complaints and Symptoms: Negative for: Numbness/parasthesias; Focal/Weakness Medical HistoryNOHELY, Deanna White ([id number]) Negative for: Dementia; Neuropathy; Quadriplegia; Paraplegia; Seizure Disorder Psychiatric Complaints and Symptoms: Negative for: Anxiety; Claustrophobia Medical History: Negative for: Anorexia/bulimia; Confinement Anxiety Oncologic Medical  History: Negative for: Received Chemotherapy; Received Radiation HBO Extended History Items Eyes: Cataracts Immunizations Pneumococcal Vaccine: Received Pneumococcal Vaccination: Yes Tetanus Vaccine: Last tetanus shot: 10/02/2017 Implantable Devices None Family and Social History Cancer: Yes - Paternal Grandparents; Diabetes: No; Heart Disease: Yes - Mother; Hereditary Spherocytosis: No; Hypertension: Yes - Mother; Kidney Disease: Yes - Father; Lung Disease: No; Seizures: Yes - Siblings; Stroke: No; Thyroid Problems: No; Tuberculosis: No; Never smoker; Marital Status - Widowed; Alcohol Use: Never; Drug Use: No History; Caffeine Use: Daily - coffee/tea; Financial Concerns: No; Food, Clothing or Shelter Needs: No; Support System Lacking: No; Transportation Concerns: No Electronic Signature(s) Signed: 02/12/2019 4:23:05 PM By: Harold Barban Signed: 02/12/2019 4:30:12 PM By: Linton Ham MD Entered By: Harold Barban on 02/12/2019 10:57:48 Deanna White ([id number]) -------------------------------------------------------------------------------- [facility] Details Patient Name: Deanna White Date of Service: 02/12/2019 Medical Record Number: [id number] Patient Account Number: [id number] Date of Birth/Sex: [birth year] (85 y.o. F) Treating RN: Cornell Barman Primary Care Provider: Deborra Medina Other Clinician: Referring Provider: Deborra Medina Treating Provider/Extender: Tito Dine in Treatment: 0 Diagnosis Coding ICD-10 Codes Code Description [phone] Non-pressure chronic ulcer of left calf limited to breakdown of skin I87.332 Chronic venous hypertension (idiopathic) with ulcer and inflammation of left lower extremity I70.242 Atherosclerosis of native arteries of left leg with ulceration of calf Facility Procedures CPT4 Code: 27741287 Description: 86767 - WOUND CARE VISIT-LEV 3 EST PT Modifier: Quantity: 1 CPT4 Code: 20947096 Description: 28366 - DEB SUBQ TISSUE 20  SQ CM/< ICD-10 Diagnosis Description L97.221 Non-pressure chronic ulcer of left calf limited to breakdown Modifier: of skin Quantity: 1 Physician Procedures CPT4: Description Modifier Quantity Code [id number] WC PHYS LEVEL 3 o NEW PT 25 1 ICD-10 Diagnosis Description L97.221 Non-pressure chronic ulcer of left calf limited to breakdown of skin I87.332 Chronic venous hypertension (idiopathic) with ulcer and  inflammation of left lower extremity I70.242 Atherosclerosis of native arteries of left leg with ulceration of calf CPT4: 6503546 11042 - WC PHYS SUBQ TISS 20 SQ CM 1 ICD-10 Diagnosis Description L97.221 Non-pressure chronic ulcer of left calf limited to breakdown of skin Electronic Signature(s) Signed: 02/12/2019 4:30:12 PM By: Linton Ham MD Entered By: Linton Ham on 02/12/2019 12:16:11

## 2019-02-13 NOTE — Progress Notes (Signed)
COLLYNS, MCQUIGG ([id number]) Visit Report for 02/12/2019 Allergy List Details Patient Name: Deanna White, Deanna White Date of Service: 02/12/2019 10:45 AM Medical Record Number: [id number] Patient Account Number: [id number] Date of Birth/Sex: 10-21-1924 (85 y.o. F) Treating RN: Harold Barban Primary Care Julien Berryman: Deborra Medina Other Clinician: Referring Buford Gayler: Deborra Medina Treating Abriel Geesey/Extender: Ricard Dillon Weeks in Treatment: 0 Allergies Active Allergies No Known Allergies Allergy Notes Electronic Signature(s) Signed: 02/12/2019 4:23:05 PM By: Harold Barban Entered By: Harold Barban on 02/12/2019 10:48:17 Deanna White ([id number]) -------------------------------------------------------------------------------- [place] Details Patient Name: Deanna White Date of Service: 02/12/2019 10:45 AM Medical Record Number: [id number] Patient Account Number: [id number] Date of Birth/Sex: 03/05/1925 (85 y.o. F) Treating RN: Harold Barban Primary Care Valree Feild: Deborra Medina Other Clinician: Referring Nattalie Santiesteban: Deborra Medina Treating Sheccid Lahmann/Extender: Tito Dine in Treatment: 0 Visit Information Patient Arrived: Ambulatory Arrival Time: 10:44 Accompanied By: self Transfer Assistance: None Patient Identification Verified: Yes Secondary Verification Process Completed: Yes Electronic Signature(s) Signed: 02/12/2019 4:23:05 PM By: Harold Barban Entered By: Harold Barban on 02/12/2019 10:46:18 Deanna White ([id number]) -------------------------------------------------------------------------------- Clinic Level of Care Assessment Details Patient Name: Deanna White Date of Service: 02/12/2019 10:45 AM Medical Record Number: [id number] Patient Account Number: [id number] Date of Birth/Sex: 24-Aug-1925 (85 y.o. F) Treating RN: Cornell Barman Primary Care Deny Chevez: Deborra Medina Other Clinician: Referring Nygel Prokop: Deborra Medina Treating  Zayli Villafuerte/Extender: Tito Dine in Treatment: 0 Clinic Level of Care Assessment Items TOOL 1 Quantity Score []  - Use when EandM and Procedure is performed on INITIAL visit 0 ASSESSMENTS - Nursing Assessment / Reassessment X - General Physical Exam (combine w/ comprehensive assessment (listed just below) when 1 20 performed on new pt. evals) X- 1 25 Comprehensive Assessment (HX, ROS, Risk Assessments, Wounds Hx, etc.) ASSESSMENTS - Wound and Skin Assessment / Reassessment []  - Dermatologic / Skin Assessment (not related to wound area) 0 ASSESSMENTS - Ostomy and/or Continence Assessment and Care []  - Incontinence Assessment and Management 0 []  - 0 Ostomy Care Assessment and Management (repouching, etc.) PROCESS - Coordination of Care X - Simple Patient / Family Education for ongoing care 1 15 []  - 0 Complex (extensive) Patient / Family Education for ongoing care []  - 0 Staff obtains Programmer, systems, Records, Test Results / Process Orders []  - 0 Staff telephones HHA, Nursing Homes / Clarify orders / etc []  - 0 Routine Transfer to another Facility (non-emergent condition) []  - 0 Routine Hospital Admission (non-emergent condition) X- 1 15 New Admissions / Biomedical engineer / Ordering NPWT, Apligraf, etc. []  - 0 Emergency Hospital Admission (emergent condition) PROCESS - Special Needs []  - Pediatric / Minor Patient Management 0 []  - 0 Isolation Patient Management []  - 0 Hearing / Language / Visual special needs []  - 0 Assessment of Community assistance (transportation, D/C planning, etc.) []  - 0 Additional assistance / Altered mentation []  - 0 Support Surface(s) Assessment (bed, cushion, seat, etc.) Deanna White, Deanna White. ([id number]) INTERVENTIONS - Miscellaneous []  - External ear exam 0 []  - 0 Patient Transfer (multiple staff / Civil Service fast streamer / Similar devices) []  - 0 Simple Staple / Suture removal (25 or less) []  - 0 Complex Staple / Suture removal (26 or more) []  -  0 Hypo/Hyperglycemic Management (do not check if billed separately) X- 1 15 Ankle / Brachial Index (ABI) - do not check if billed separately Has the patient been seen at the hospital within the last three years: Yes Total Score: 90 Level Of Care: New/Established - Level 3 Electronic  Signature(s) Signed: 02/13/2019 10:03:11 AM By: Gretta Cool, BSN, RN, CWS, Kim RN, BSN Entered By: Gretta Cool, BSN, RN, CWS, Kim on 02/12/2019 11:36:35 Deanna White ([id number]) -------------------------------------------------------------------------------- Encounter Discharge Information Details Patient Name: Deanna White Date of Service: 02/12/2019 10:45 AM Medical Record Number: [id number] Patient Account Number: [id number] Date of Birth/Sex: 05/27/1925 (85 y.o. F) Treating RN: Harold Barban Primary Care Jakub Debold: Deborra Medina Other Clinician: Referring Welma Mccombs: Deborra Medina Treating Christien Berthelot/Extender: Tito Dine in Treatment: 0 Encounter Discharge Information Items Post Procedure Vitals Discharge Condition: Stable Temperature (F): 98.2 Ambulatory Status: Ambulatory Pulse (bpm): 78 Discharge Destination: Home Respiratory Rate (breaths/min): 16 Transportation: Private Auto Blood Pressure (mmHg): 185/62 Accompanied By: self Schedule Follow-up Appointment: Yes Clinical Summary of Care: Electronic Signature(s) Signed: 02/12/2019 4:23:05 PM By: Harold Barban Entered By: Harold Barban on 02/12/2019 11:46:58 Deanna White ([id number]) -------------------------------------------------------------------------------- Lower Extremity Assessment Details Patient Name: Deanna White Date of Service: 02/12/2019 10:45 AM Medical Record Number: [id number] Patient Account Number: [id number] Date of Birth/Sex: Mar 28, 1925 (85 y.o. F) Treating RN: Harold Barban Primary Care [place]: Deborra Medina Other Clinician: Referring Herschell Virani: Deborra Medina Treating Kolyn Rozario/Extender: Tito Dine in Treatment: 0 Edema Assessment Assessed: [Left: No] [Right: No] [Left: Edema] [Right: :] Calf Left: Right: Point of Measurement: 30 cm From Medial Instep 30.5 cm 30 cm Ankle Left: Right: Point of Measurement: 10 cm From Medial Instep 21 cm 20 cm Vascular Assessment Pulses: Dorsalis Pedis Palpable: [Left:No] [Right:Yes] Posterior Tibial Palpable: [Left:No] [Right:Yes] Blood Pressure: Brachial: [Right:180] Dorsalis Pedis: 100 Ankle: Posterior Tibial: 60 Ankle Brachial Index: [Right:0.56] Electronic Signature(s) Signed: 02/12/2019 4:23:05 PM By: Harold Barban Entered By: Harold Barban on 02/12/2019 11:11:01 Deanna White ([id number]) -------------------------------------------------------------------------------- Multi Wound Chart Details Patient Name: Deanna White Date of Service: 02/12/2019 10:45 AM Medical Record Number: [id number] Patient Account Number: [id number] Date of Birth/Sex: 1925-03-17 (85 y.o. F) Treating RN: Cornell Barman Primary Care Taevin Mcferran: Deborra Medina Other Clinician: Referring Timithy Arons: Deborra Medina Treating Xzaviar Maloof/Extender: Tito Dine in Treatment: 0 Vital Signs Height(in): 62 Pulse(bpm): Weight(lbs): 118 Blood Pressure(mmHg): Body Mass Index(BMI): 22 Temperature(F): 98.2 Respiratory Rate 16 (breaths/min): Photos: [N/A:N/A] Wound Location: Left Lower Leg - Posterior N/A N/A Wounding Event: Gradually Appeared N/A N/A Primary Etiology: Venous Leg Ulcer N/A N/A Comorbid History: Cataracts, Hypertension N/A N/A Date Acquired: 10/02/2018 N/A N/A Weeks of Treatment: 0 N/A N/A Wound Status: Open N/A N/A Measurements L x W x D 0.9x0.5x0.1 N/A N/A (cm) Area (cm) : 0.353 N/A N/A Volume (cm) : 0.035 N/A N/A Classification: Full Thickness Without N/A N/A Exposed Support Structures Exudate Amount: Medium N/A N/A Exudate Type: Sanguinous N/A N/A Exudate Color: red N/A N/A Wound Margin: Flat and Intact N/A  N/A Granulation Amount: None Present (0%) N/A N/A Necrotic Amount: Large (67-100%) N/A N/A Exposed Structures: Fat Layer (Subcutaneous N/A N/A Tissue) Exposed: Yes Fascia: No Tendon: No Muscle: No Joint: No Bone: No Epithelialization: None N/A N/A Debridement: Debridement - Excisional N/A N/A 11:25 N/A N/A Deanna White, Deanna White ([id number]) Pre-procedure Verification/Time Out Taken: Pain Control: Lidocaine N/A N/A Tissue Debrided: Subcutaneous, Slough N/A N/A Level: Skin/Subcutaneous Tissue N/A N/A Debridement Area (sq cm): 0.45 N/A N/A Instrument: Curette N/A N/A Bleeding: Moderate N/A N/A Hemostasis Achieved: Pressure N/A N/A Debridement Treatment Procedure was tolerated well N/A N/A Response: Post Debridement 0.9x0.5x0.1 N/A N/A Measurements L x W x D (cm) Post Debridement Volume: 0.035 N/A N/A (cm) Periwound Skin Texture: No Abnormalities Noted N/A N/A Periwound Skin Moisture: Dry/Scaly: Yes N/A N/A Periwound Skin  Color: No Abnormalities Noted N/A N/A Tenderness on Palpation: Yes N/A N/A Procedures Performed: Debridement N/A N/A Treatment Notes Wound #1 (Left, Posterior Lower Leg) Notes silver collagen, conform Electronic Signature(s) Signed: 02/12/2019 4:30:12 PM By: Linton Ham MD Entered By: Linton Ham on 02/12/2019 12:02:46 Deanna White ([id number]) -------------------------------------------------------------------------------- [place] Details Patient Name: Deanna White Date of Service: 02/12/2019 10:45 AM Medical Record Number: [id number] Patient Account Number: [id number] Date of Birth/Sex: 1925/09/23 (85 y.o. F) Treating RN: Cornell Barman Primary Care Lavance Beazer: Deborra Medina Other Clinician: Referring Dominque Marlin: Deborra Medina Treating Emilie Carp/Extender: Tito Dine in Treatment: 0 Active Inactive Necrotic Tissue Nursing Diagnoses: Impaired tissue integrity related to necrotic/devitalized tissue Knowledge deficit  related to management of necrotic/devitalized tissue Goals: Necrotic/devitalized tissue will be minimized in the wound bed Date Initiated: 02/12/2019 Target Resolution Date: 02/19/2019 Goal Status: Active Interventions: Assess patient pain level pre-, during and post procedure and prior to discharge Treatment Activities: Apply topical anesthetic as ordered : 02/12/2019 Excisional debridement : 02/12/2019 Notes: Orientation to the Wound Care Program Nursing Diagnoses: Knowledge deficit related to the wound healing center program Goals: Patient/caregiver will verbalize understanding of the [place] Date Initiated: 02/12/2019 Target Resolution Date: 02/12/2019 Goal Status: Active Interventions: Provide education on orientation to the wound center Notes: Pain, Acute or Chronic Nursing Diagnoses: Pain, acute or chronic: actual or potential Goals: Patient will verbalize adequate pain control and receive pain control interventions during procedures as needed Deanna White, Deanna White ([id number]) Date Initiated: 02/12/2019 Target Resolution Date: 02/19/2019 Goal Status: Active Interventions: Assess comfort goal upon admission Notes: Wound/Skin Impairment Nursing Diagnoses: Impaired tissue integrity Goals: Patient/caregiver will verbalize understanding of skin care regimen Date Initiated: 02/12/2019 Target Resolution Date: 02/19/2019 Goal Status: Active Ulcer/skin breakdown will have a volume reduction of 30% by week 4 Date Initiated: 02/12/2019 Target Resolution Date: 03/15/2019 Goal Status: Active Interventions: Assess ulceration(s) every visit Notes: Electronic Signature(s) Signed: 02/13/2019 10:03:11 AM By: Gretta Cool, BSN, RN, CWS, Kim RN, BSN Entered By: Gretta Cool, BSN, RN, CWS, Kim on 02/12/2019 11:36:00 Deanna White ([id number]) -------------------------------------------------------------------------------- Pain Assessment Details Patient Name: Deanna White Date of  Service: 02/12/2019 10:45 AM Medical Record Number: [id number] Patient Account Number: [id number] Date of Birth/Sex: 07-14-1925 (85 y.o. F) Treating RN: Harold Barban Primary Care Kienan Doublin: Deborra Medina Other Clinician: Referring Ziyan Hillmer: Deborra Medina Treating Gardner Servantes/Extender: Tito Dine in Treatment: 0 Active Problems Location of Pain Severity and Description of Pain Patient Has Paino No Site Locations Pain Management and Medication Current Pain Management: Electronic Signature(s) Signed: 02/12/2019 4:23:05 PM By: Harold Barban Entered By: Harold Barban on 02/12/2019 10:46:49 Deanna White ([id number]) -------------------------------------------------------------------------------- Patient/Caregiver Education Details Patient Name: Deanna White Date of Service: 02/12/2019 10:45 AM Medical Record Number: [id number] Patient Account Number: [id number] Date of Birth/Gender: 01-16-1925 (85 y.o. F) Treating RN: Cornell Barman Primary Care Physician: Deborra Medina Other Clinician: Referring Physician: Deborra Medina Treating Physician/Extender: Tito Dine in Treatment: 0 Education Assessment Education Provided To: Patient Education Topics Provided Wound/Skin Impairment: Handouts: Caring for Your Ulcer Methods: Demonstration, Explain/Verbal Responses: State content correctly Electronic Signature(s) Signed: 02/13/2019 10:03:11 AM By: Gretta Cool, BSN, RN, CWS, Kim RN, BSN Entered By: Gretta Cool, BSN, RN, CWS, Kim on 02/12/2019 11:36:51 Deanna White ([id number]) -------------------------------------------------------------------------------- Wound Assessment Details Patient Name: Deanna White Date of Service: 02/12/2019 10:45 AM Medical Record Number: [id number] Patient Account Number: [id number] Date of Birth/Sex: 1924-11-14 (85 y.o. F) Treating RN: Harold Barban Primary Care Sahasra Belue: Deborra Medina Other Clinician: Referring  Chayil Gantt: Deborra Medina Treating Decari Duggar/Extender: Tito Dine in Treatment: 0 Wound Status Wound Number: 1 Primary Etiology: Venous Leg Ulcer Wound Location: Left Lower Leg - Posterior Wound Status: Open Wounding Event: Gradually Appeared Comorbid History: Cataracts, Hypertension Date Acquired: 10/02/2018 Weeks Of Treatment: 0 Clustered Wound: No Photos Wound Measurements Length: (cm) 0.9 Width: (cm) 0.5 Depth: (cm) 0.1 Area: (cm) 0.353 Volume: (cm) 0.035 % Reduction in Area: % Reduction in Volume: Epithelialization: None Tunneling: No Undermining: No Wound Description Full Thickness Without Exposed Support Classification: Structures Wound Margin: Flat and Intact Exudate Medium Amount: Exudate Type: Sanguinous Exudate Color: red Foul Odor After Cleansing: No Slough/Fibrino Yes Wound Bed Granulation Amount: None Present (0%) Exposed Structure Necrotic Amount: Large (67-100%) Fascia Exposed: No Necrotic Quality: Adherent Slough Fat Layer (Subcutaneous Tissue) Exposed: Yes Tendon Exposed: No Muscle Exposed: No Joint Exposed: No Bone Exposed: No Periwound Skin Texture TANNY, HARNACK. ([id number]) Texture Color No Abnormalities Noted: No No Abnormalities Noted: No Moisture Temperature / Pain No Abnormalities Noted: No Tenderness on Palpation: Yes Dry / Scaly: Yes Treatment Notes Wound #1 (Left, Posterior Lower Leg) Notes silver collagen, conform Electronic Signature(s) Signed: 02/12/2019 4:23:05 PM By: Harold Barban Entered By: Harold Barban on 02/12/2019 11:15:21 Deanna White ([id number]) -------------------------------------------------------------------------------- [place] Details Patient Name: Deanna White Date of Service: 02/12/2019 10:45 AM Medical Record Number: [id number] Patient Account Number: [id number] Date of Birth/Sex: Jul 02, 1925 (85 y.o. F) Treating RN: Harold Barban Primary Care Yenty Bloch: Deborra Medina Other Clinician: Referring  Ezequiel Macauley: Deborra Medina Treating Hermione Havlicek/Extender: Tito Dine in Treatment: 0 Vital Signs Time Taken: 10:47 Temperature (F): 98.2 Height (in): 62 Respiratory Rate (breaths/min): 16 Source: Stated Reference Range: 80 - 120 mg / dl Weight (lbs): 118 Source: Stated Body Mass Index (BMI): 21.6 Electronic Signature(s) Signed: 02/12/2019 4:23:05 PM By: Harold Barban Entered By: Harold Barban on 02/12/2019 10:47:52

## 2019-02-19 ENCOUNTER — Other Ambulatory Visit: Payer: Self-pay

## 2019-02-19 ENCOUNTER — Encounter: Payer: Medicare Other | Admitting: Internal Medicine

## 2019-02-19 DIAGNOSIS — I87332 Chronic venous hypertension (idiopathic) with ulcer and inflammation of left lower extremity: Secondary | ICD-10-CM | POA: Diagnosis not present

## 2019-02-20 NOTE — Progress Notes (Signed)
Deanna White, Deanna White ([id number]) Visit Report for 02/19/2019 Arrival Information Details Patient Name: Deanna White, Deanna White Date of Service: 02/19/2019 12:30 PM Medical Record Number: [id number] Patient Account Number: [id number] Date of Birth/Sex: 26-Jan-1925 (85 y.o. F) Treating RN: Harold Barban Primary Care Romana Deaton: Deborra Medina Other Clinician: Referring Fabiha Rougeau: Deborra Medina Treating Keaden Gunnoe/Extender: Beverly Gust in Treatment: 1 Visit Information History Since Last Visit Added or deleted any medications: No Patient Arrived: Ambulatory Any new allergies or adverse reactions: No Arrival Time: 12:34 Had a fall or experienced change in No Accompanied By: self activities of daily living that may affect Transfer Assistance: None risk of falls: Patient Identification Verified: Yes Signs or symptoms of abuse/neglect since last visito No Secondary Verification Process Completed: Yes Hospitalized since last visit: No Has Dressing in Place as Prescribed: Yes Pain Present Now: No Electronic Signature(s) Signed: 02/19/2019 4:44:13 PM By: Harold Barban Entered By: Harold Barban on 02/19/2019 12:37:15 Deanna White ([id number]) -------------------------------------------------------------------------------- General Visit Notes Details Patient Name: Deanna White Date of Service: 02/19/2019 12:30 PM Medical Record Number: [id number] Patient Account Number: [id number] Date of Birth/Sex: Apr 25, 1925 (85 y.o. F) Treating RN: Cornell Barman Primary Care Chrystel Barefield: Deborra Medina Other Clinician: Referring Shriya Aker: Deborra Medina Treating Adolf Ormiston/Extender: Beverly Gust in Treatment: 1 Notes MD made aware of paient's vascular status. No pulses in left lower extremity. Electronic Signature(s) Signed: 02/19/2019 5:05:53 PM By: Gretta Cool, BSN, RN, CWS, Kim RN, BSN Entered By: Gretta Cool, BSN, RN, CWS, Kim on 02/19/2019 12:55:59 Deanna White  ([id number]) -------------------------------------------------------------------------------- Lower Extremity Assessment Details Patient Name: Deanna White Date of Service: 02/19/2019 12:30 PM Medical Record Number: [id number] Patient Account Number: [id number] Date of Birth/Sex: 1925-07-29 (85 y.o. F) Treating RN: Harold Barban Primary Care Mckenleigh Tarlton: Deborra Medina Other Clinician: Referring Carrington Mullenax: Deborra Medina Treating Mikaiah Stoffer/Extender: Beverly Gust in Treatment: 1 Vascular Assessment Pulses: Dorsalis Pedis Palpable: [Left:Yes] Posterior Tibial Palpable: [Left:Yes] Electronic Signature(s) Signed: 02/19/2019 4:44:13 PM By: Harold Barban Entered By: Harold Barban on 02/19/2019 12:43:47 Deanna White ([id number]) -------------------------------------------------------------------------------- Multi Wound Chart Details Patient Name: Deanna White Date of Service: 02/19/2019 12:30 PM Medical Record Number: [id number] Patient Account Number: [id number] Date of Birth/Sex: Apr 27, 1925 (85 y.o. F) Treating RN: Cornell Barman Primary Care Pascha Fogal: Deborra Medina Other Clinician: Referring Savannha Welle: Deborra Medina Treating Micheale Schlack/Extender: Beverly Gust in Treatment: 1 Vital Signs Height(in): 62 Pulse(bpm): 56 Weight(lbs): 118 Blood Pressure(mmHg): 162/53 Body Mass Index(BMI): 22 Temperature(F): 97.7 Respiratory Rate 16 (breaths/min): Photos: [N/A:N/A] Wound Location: Left Lower Leg - Posterior N/A N/A Wounding Event: Gradually Appeared N/A N/A Primary Etiology: Venous Leg Ulcer N/A N/A Comorbid History: Cataracts, Hypertension N/A N/A Date Acquired: 10/02/2018 N/A N/A Weeks of Treatment: 1 N/A N/A Wound Status: Open N/A N/A Measurements L x W x D 0.9x0.6x0.1 N/A N/A (cm) Area (cm) : 0.424 N/A N/A Volume (cm) : 0.042 N/A N/A % Reduction in Area: -20.10% N/A N/A % Reduction in Volume: -20.00% N/A N/A Classification: Full Thickness Without N/A  N/A Exposed Support Structures Exudate Amount: Medium N/A N/A Exudate Type: Sanguinous N/A N/A Exudate Color: red N/A N/A Wound Margin: Flat and Intact N/A N/A Granulation Amount: None Present (0%) N/A N/A Necrotic Amount: Large (67-100%) N/A N/A Exposed Structures: Fat Layer (Subcutaneous N/A N/A Tissue) Exposed: Yes Fascia: No Tendon: No Muscle: No Joint: No Bone: No Epithelialization: None N/A N/A Treatment Notes Electronic Signature(s) Signed: 02/19/2019 5:05:53 PM By: Gretta Cool, BSN, RN, CWS, Kim RN, BSN Entered By: Gretta Cool, BSN, RN, CWS, Kim on 02/19/2019 12:56:10 Deanna White, Deanna S. (  [id number], Deanna White ([id number]) -------------------------------------------------------------------------------- Campus Details Patient Name: Deanna White, Deanna White Date of Service: 02/19/2019 12:30 PM Medical Record Number: [id number] Patient Account Number: [id number] Date of Birth/Sex: Jun 03, 1925 (85 y.o. F) Treating RN: Cornell Barman Primary Care Chermaine Schnyder: Deborra Medina Other Clinician: Referring Chloe Flis: Deborra Medina Treating Octavion Mollenkopf/Extender: Beverly Gust in Treatment: 1 Active Inactive Necrotic Tissue Nursing Diagnoses: Impaired tissue integrity related to necrotic/devitalized tissue Knowledge deficit related to management of necrotic/devitalized tissue Goals: Necrotic/devitalized tissue will be minimized in the wound bed Date Initiated: 02/12/2019 Target Resolution Date: 02/19/2019 Goal Status: Active Interventions: Assess patient pain level pre-, during and post procedure and prior to discharge Treatment Activities: Apply topical anesthetic as ordered : 02/12/2019 Excisional debridement : 02/12/2019 Notes: Orientation to the Wound Care Program Nursing Diagnoses: Knowledge deficit related to the wound healing center program Goals: Patient/caregiver will verbalize understanding of the [facility] Date Initiated: 02/12/2019 Target  Resolution Date: 02/12/2019 Goal Status: Active Interventions: Provide education on orientation to the wound center Notes: Pain, Acute or Chronic Nursing Diagnoses: Pain, acute or chronic: actual or potential Goals: Patient will verbalize adequate pain control and receive pain control interventions during procedures as needed Date Initiated: 02/12/2019 Target Resolution Date: 02/19/2019 Goal Status: Active Interventions: Assess comfort goal upon admission Notes: Deanna White, Deanna White ([id number]) Wound/Skin Impairment Nursing Diagnoses: Impaired tissue integrity Goals: Patient/caregiver will verbalize understanding of skin care regimen Date Initiated: 02/12/2019 Target Resolution Date: 02/19/2019 Goal Status: Active Ulcer/skin breakdown will have a volume reduction of 30% by week 4 Date Initiated: 02/12/2019 Target Resolution Date: 03/15/2019 Goal Status: Active Interventions: Assess ulceration(s) every visit Notes: Electronic Signature(s) Signed: 02/19/2019 5:05:53 PM By: Gretta Cool, BSN, RN, CWS, Kim RN, BSN Entered By: Gretta Cool, BSN, RN, CWS, Kim on 02/19/2019 12:54:17 Deanna White ([id number]) -------------------------------------------------------------------------------- Pain Assessment Details Patient Name: Deanna White Date of Service: 02/19/2019 12:30 PM Medical Record Number: [id number] Patient Account Number: [id number] Date of Birth/Sex: June 17, 1925 (85 y.o. F) Treating RN: Harold Barban Primary Care Maecy Podgurski: Deborra Medina Other Clinician: Referring Carmel Garfield: Deborra Medina Treating Gretel Cantu/Extender: Beverly Gust in Treatment: 1 Active Problems Location of Pain Severity and Description of Pain Patient Has Paino No Site Locations Pain Management and Medication Current Pain Management: Electronic Signature(s) Signed: 02/19/2019 4:44:13 PM By: Harold Barban Entered By: Harold Barban on 02/19/2019 12:37:22 Deanna White  ([id number]) -------------------------------------------------------------------------------- Patient/Caregiver Education Details Patient Name: Deanna White Date of Service: 02/19/2019 12:30 PM Medical Record Number: [id number] Patient Account Number: [id number] Date of Birth/Gender: 02-10-1925 (85 y.o. F) Treating RN: Cornell Barman Primary Care Physician: Deborra Medina Other Clinician: Referring Physician: Deborra Medina Treating Physician/Extender: Beverly Gust in Treatment: 1 Education Assessment Education Provided To: Patient Education Topics Provided Wound/Skin Impairment: Handouts: Caring for Your Ulcer Methods: Demonstration, Explain/Verbal Responses: State content correctly Electronic Signature(s) Signed: 02/19/2019 5:05:53 PM By: Gretta Cool, BSN, RN, CWS, Kim RN, BSN Entered By: Gretta Cool, BSN, RN, CWS, Kim on 02/19/2019 12:59:02 Deanna White ([id number]) -------------------------------------------------------------------------------- Wound Assessment Details Patient Name: Deanna White Date of Service: 02/19/2019 12:30 PM Medical Record Number: [id number] Patient Account Number: [id number] Date of Birth/Sex: [birth year] (85 y.o. F) Treating RN: Harold Barban Primary Care Yesenia Fontenette: Deborra Medina Other Clinician: Referring Devina Bezold: Deborra Medina Treating Jarold Macomber/Extender: Beverly Gust in Treatment: 1 Wound Status Wound Number: 1 Primary Etiology: Venous Leg Ulcer Wound Location: Left Lower Leg - Posterior Wound Status: Open Wounding Event: Gradually Appeared Comorbid History: Cataracts, Hypertension Date Acquired: 10/02/2018 Weeks Of Treatment: 1 Clustered Wound:  No Photos Wound Measurements Length: (cm) 0.9 Width: (cm) 0.6 Depth: (cm) 0.1 Area: (cm) 0.424 Volume: (cm) 0.042 % Reduction in Area: -20.1% % Reduction in Volume: -20% Epithelialization: None Tunneling: No Undermining: No Wound Description Full Thickness Without Exposed  Support Classification: Structures Wound Margin: Flat and Intact Exudate Medium Amount: Exudate Type: Sanguinous Exudate Color: red Foul Odor After Cleansing: No Slough/Fibrino Yes Wound Bed Granulation Amount: None Present (0%) Exposed Structure Necrotic Amount: Large (67-100%) Fascia Exposed: No Necrotic Quality: Adherent Slough Fat Layer (Subcutaneous Tissue) Exposed: Yes Tendon Exposed: No Muscle Exposed: No Joint Exposed: No Bone Exposed: No Electronic Signature(s) Signed: 02/19/2019 4:44:13 PM By: Harold Barban Entered By: Harold Barban on 02/19/2019 12:42:44 Deanna White ([id number]) -------------------------------------------------------------------------------- [place] Details Patient Name: Deanna White Date of Service: 02/19/2019 12:30 PM Medical Record Number: [id number] Patient Account Number: [id number] Date of Birth/Sex: [birth year] (85 y.o. F) Treating RN: Harold Barban Primary Care Percy Winterrowd: Deborra Medina Other Clinician: Referring George Alcantar: Deborra Medina Treating Lumir Demetriou/Extender: Beverly Gust in Treatment: 1 Vital Signs Time Taken: 12:37 Temperature (F): 97.7 Height (in): 62 Pulse (bpm): 67 Weight (lbs): 118 Respiratory Rate (breaths/min): 16 Body Mass Index (BMI): 21.6 Blood Pressure (mmHg): 162/53 Reference Range: 80 - 120 mg / dl Electronic Signature(s) Signed: 02/19/2019 4:44:13 PM By: Harold Barban Entered By: Harold Barban on 02/19/2019 12:39:40

## 2019-02-20 NOTE — Progress Notes (Signed)
Deanna White, Deanna White ([id number]) Visit Report for 02/19/2019 Debridement Details Patient Name: Deanna White, Deanna White Date of Service: 02/19/2019 12:30 PM Medical Record Number: [id number] Patient Account Number: [id number] Date of Birth/Sex: May 15, 1925 (85 y.o. F) Treating RN: Cornell Barman Primary Care Provider: Deborra Medina Other Clinician: Referring Provider: Deborra Medina Treating Provider/Extender: Beverly Gust in Treatment: 1 Debridement Performed for Wound #1 Left,Posterior Lower Leg Assessment: Performed By: Physician Tobi Bastos, MD Debridement Type: Debridement Severity of Tissue Pre Fat layer exposed Debridement: Level of Consciousness (Pre- Awake and Alert procedure): Pre-procedure Verification/Time Yes - 12:55 Out Taken: Start Time: 12:56 Pain Control: Lidocaine Total Area Debrided (L x W): 0.9 (cm) x 0.6 (cm) = 0.54 (cm) Tissue and other material Non-Viable, [place] debrided: Level: Non-Viable Tissue Debridement Description: Selective/Open Wound Instrument: Curette Bleeding: None End Time: 12:57 Response to Treatment: Procedure was tolerated well Level of Consciousness Awake and Alert (Post-procedure): Post Debridement Measurements of Total Wound Length: (cm) 0.9 Width: (cm) 0.6 Depth: (cm) 0.1 Volume: (cm) 0.042 Character of Wound/Ulcer Post Debridement: Requires Further Debridement Severity of Tissue Post Debridement: Fat layer exposed Post Procedure Diagnosis Same as Pre-procedure Electronic Signature(s) Signed: 02/19/2019 4:16:13 PM By: Tobi Bastos Signed: 02/19/2019 5:05:53 PM By: Gretta Cool, BSN, RN, CWS, Kim RN, BSN Entered By: Gretta Cool, BSN, RN, CWS, Kim on 02/19/2019 12:58:08 Deanna White ([id number]) -------------------------------------------------------------------------------- HPI Details Patient Name: Deanna White Date of Service: 02/19/2019 12:30 PM Medical Record Number: [id number] Patient Account Number: [id number] Date  of Birth/Sex: 1925-01-19 (85 y.o. F) Treating RN: Cornell Barman Primary Care Provider: Deborra Medina Other Clinician: Referring Provider: Deborra Medina Treating Provider/Extender: Beverly Gust in Treatment: 1 History of Present Illness HPI Description: 02/12/2019 Admission This is a very functional 85 year old woman who drove herself to our clinic today. She is concerned about a wound on her left posterior calf. This apparently started 4 to 6 weeks ago at which time she developed increasing swelling and redness in her lower leg. She saw her primary doctor in April who thought this was a venous insufficiency ulcer with some degree of good venous stasis. She did receive antibiotics as well as triamcinolone cream for the inflammation. The patient complains of a lot of pain both in the wound and also in the lateral part of her ankle which is especially worse at night and relieved by standing or walking. She is a nondiabetic and non-smoker. The patient had a venous Doppler on 10/09/2018 that was negative for a DVT and no reflux noted in the greater saphenous vein. No comment on superficial vein thrombosis. Past medical history includes chronic kidney disease stage III, hypothyroidism, glaucoma, history of venous ulcer, hypertension ABIs in our clinic were not obtainable on the left even with the Doppler. 0.56 on the right 5/20-Patient returns to clinic after 1 week, she was started on Prisma with every other day dressing changes, she has noticed pain with leg up, but denies pain with ambulation. Dopplers are scheduled for June 4 and ABIs are not obtained in clinic Electronic Signature(s) Signed: 02/19/2019 1:02:09 PM By: Tobi Bastos Entered By: Tobi Bastos on 02/19/2019 13:02:09 Deanna White ([id number]) -------------------------------------------------------------------------------- Physical Exam Details Patient Name: Deanna White Date of Service: 02/19/2019 12:30 PM Medical  Record Number: [id number] Patient Account Number: [id number] Date of Birth/Sex: 04-08-1925 (85 y.o. F) Treating RN: Cornell Barman Primary Care Provider: Deborra Medina Other Clinician: Referring Provider: Deborra Medina Treating Provider/Extender: Beverly Gust in Treatment: 1 Constitutional alert and oriented x 3. sitting or standing  blood pressure is within target range for patient.. supine blood pressure is within target range for patient.. pulse regular and within target range for patient.Marland Kitchen respirations regular, non-labored and within target range for patient.Marland Kitchen temperature within target range for patient.. . . Well-nourished and well-hydrated in no acute distress. Notes Area on posterior calf on left leg with small ulceration with slough, surrounding skin looks good, not much edema, pulses poorly palpable with good capillary refill in toes Debrided ulcer with #3 curette with removal of slough, with underlying fibrious layer Electronic Signature(s) Signed: 02/19/2019 1:16:52 PM By: Tobi Bastos Previous Signature: 02/19/2019 1:02:59 PM Version By: Tobi Bastos Entered By: Tobi Bastos on 02/19/2019 13:16:51 Deanna White ([id number]) -------------------------------------------------------------------------------- Physician Orders Details Patient Name: Deanna White Date of Service: 02/19/2019 12:30 PM Medical Record Number: [id number] Patient Account Number: [id number] Date of Birth/Sex: 01-18-1925 (85 y.o. F) Treating RN: Cornell Barman Primary Care Provider: Deborra Medina Other Clinician: Referring Provider: Deborra Medina Treating Provider/Extender: Beverly Gust in Treatment: 1 Verbal / Phone Orders: No Diagnosis Coding Wound Cleansing Wound #1 Left,Posterior Lower Leg o Clean wound with Normal Saline. o May Shower, gently pat wound dry prior to applying new dressing. Anesthetic (add to Medication List) Wound #1 Left,Posterior Lower Leg o Topical  Lidocaine 4% cream applied to wound bed prior to debridement (In Clinic Only). Primary Wound Dressing Wound #1 Left,Posterior Lower Leg o Silver Collagen Secondary Dressing Wound #1 Left,Posterior Lower Leg o Conform/Kerlix - Lightly to secure collagen in place. Dressing Change Frequency Wound #1 Left,Posterior Lower Leg o Change Dressing Monday, Wednesday, Friday Follow-up Appointments Wound #1 Left,Posterior Lower Leg o Return Appointment in 1 week. Edema Control Wound #1 Left,Posterior Lower Leg o Elevate legs to the level of the heart and pump ankles as often as possible Electronic Signature(s) Signed: 02/19/2019 4:16:13 PM By: Tobi Bastos Signed: 02/19/2019 5:05:53 PM By: Gretta Cool, BSN, RN, CWS, Kim RN, BSN Entered By: Gretta Cool, BSN, RN, CWS, Kim on 02/19/2019 12:58:37 Deanna White ([id number]) -------------------------------------------------------------------------------- Progress Note Details Patient Name: Deanna White Date of Service: 02/19/2019 12:30 PM Medical Record Number: [id number] Patient Account Number: [id number] Date of Birth/Sex: 19-Nov-1924 (85 y.o. F) Treating RN: Cornell Barman Primary Care Provider: Deborra Medina Other Clinician: Referring Provider: Deborra Medina Treating Provider/Extender: Beverly Gust in Treatment: 1 Subjective History of Present Illness (HPI) 02/12/2019 Admission This is a very functional 85 year old woman who drove herself to our clinic today. She is concerned about a wound on her left posterior calf. This apparently started 4 to 6 weeks ago at which time she developed increasing swelling and redness in her lower leg. She saw her primary doctor in April who thought this was a venous insufficiency ulcer with some degree of good venous stasis. She did receive antibiotics as well as triamcinolone cream for the inflammation. The patient complains of a lot of pain both in the wound and also in the lateral part of her ankle  which is especially worse at night and relieved by standing or walking. She is a nondiabetic and non-smoker. The patient had a venous Doppler on 10/09/2018 that was negative for a DVT and no reflux noted in the greater saphenous vein. No comment on superficial vein thrombosis. Past medical history includes chronic kidney disease stage III, hypothyroidism, glaucoma, history of venous ulcer, hypertension ABIs in our clinic were not obtainable on the left even with the Doppler. 0.56 on the right 5/20-Patient returns to clinic after 1 week, she was started on Prisma with  every other day dressing changes, she has noticed pain with leg up, but denies pain with ambulation. Dopplers are scheduled for June 4 and ABIs are not obtained in clinic Objective Constitutional alert and oriented x 3. sitting or standing blood pressure is within target range for patient.. supine blood pressure is within target range for patient.. pulse regular and within target range for patient.Marland Kitchen respirations regular, non-labored and within target range for patient.Marland Kitchen temperature within target range for patient.. Well-nourished and well-hydrated in no acute distress. Vitals Time Taken: 12:37 PM, Height: 62 in, Weight: 118 lbs, BMI: 21.6, Temperature: 97.7 F, Pulse: 67 bpm, Respiratory Rate: 16 breaths/min, Blood Pressure: 162/53 mmHg. General Notes: Area on posterior calf on left leg with small ulceration with slough, surrounding skin looks good, not much edema, pulses poorly palpable with good capillary refill in toes Integumentary (Hair, Skin) Wound #1 status is Open. Original cause of wound was Gradually Appeared. The wound is located on the Left,Posterior Lower Leg. The wound measures 0.9cm length x 0.6cm width x 0.1cm depth; 0.424cm^2 area and 0.042cm^3 volume. There is Fat Layer (Subcutaneous Tissue) Exposed exposed. There is no tunneling or undermining noted. There is a medium amount of sanguinous drainage noted. The wound  margin is flat and intact. There is no granulation within the wound bed. There is a large (67-100%) amount of necrotic tissue within the wound bed including Adherent Slough. Procedures DANIJAH, NOH ([id number]) Wound #1 Pre-procedure diagnosis of Wound #1 is a Venous Leg Ulcer located on the Left,Posterior Lower Leg .Severity of Tissue Pre Debridement is: Fat layer exposed. There was a Selective/Open Wound Non-Viable Tissue Debridement with a total area of 0.54 sq cm performed by Tobi Bastos, MD. With the following instrument(s): Curette to remove Non-Viable tissue/material. Material removed includes [facility] after achieving pain control using Lidocaine. No specimens were taken. A time out was conducted at 12:55, prior to the start of the procedure. There was no bleeding. The procedure was tolerated well. Post Debridement Measurements: 0.9cm length x 0.6cm width x 0.1cm depth; 0.042cm^3 volume. Character of Wound/Ulcer Post Debridement requires further debridement. Severity of Tissue Post Debridement is: Fat layer exposed. Post procedure Diagnosis Wound #1: Same as Pre-Procedure Plan Wound Cleansing: Wound #1 Left,Posterior Lower Leg: Clean wound with Normal Saline. May Shower, gently pat wound dry prior to applying new dressing. Anesthetic (add to Medication List): Wound #1 Left,Posterior Lower Leg: Topical Lidocaine 4% cream applied to wound bed prior to debridement (In Clinic Only). Primary Wound Dressing: Wound #1 Left,Posterior Lower Leg: Silver Collagen Secondary Dressing: Wound #1 Left,Posterior Lower Leg: Conform/Kerlix - Lightly to secure collagen in place. Dressing Change Frequency: Wound #1 Left,Posterior Lower Leg: Change Dressing Monday, Wednesday, Friday Follow-up Appointments: Wound #1 Left,Posterior Lower Leg: Return Appointment in 1 week. Edema Control: Wound #1 Left,Posterior Lower Leg: Elevate legs to the level of the heart and pump ankles as often as  possible 1. Continue Prisma to wound every other day 2. follow ABIs and vascular studies 3. Next visit next week Electronic Signature(s) Signed: 02/19/2019 1:17:15 PM By: Tobi Bastos Previous Signature: 02/19/2019 1:04:35 PM Version By: Tobi Bastos Entered By: Tobi Bastos on 02/19/2019 13:17:15 Deanna White ([id number]) -------------------------------------------------------------------------------- SuperBill Details Patient Name: Deanna White Date of Service: 02/19/2019 Medical Record Number: [id number] Patient Account Number: [id number] Date of Birth/Sex: 03/01/1925 (85 y.o. F) Treating RN: Cornell Barman Primary Care Provider: Deborra Medina Other Clinician: Referring Provider: Deborra Medina Treating Provider/Extender: Beverly Gust in Treatment: 1 Diagnosis Coding ICD-10 Codes  Code Description L97.221 Non-pressure chronic ulcer of left calf limited to breakdown of skin I87.332 Chronic venous hypertension (idiopathic) with ulcer and inflammation of left lower extremity I70.242 Atherosclerosis of native arteries of left leg with ulceration of calf Facility Procedures CPT4 Code: [id number] Description: [phone] - DEBRIDE WOUND 1ST 20 SQ CM OR < ICD-10 Diagnosis Description L97.221 Non-pressure chronic ulcer of left calf limited to breakdown Modifier: of skin Quantity: 1 Physician Procedures CPT4 Code: [id number] Description: 19166 - WC PHYS DEBR WO ANESTH 20 SQ CM ICD-10 Diagnosis Description L97.221 Non-pressure chronic ulcer of left calf limited to breakdown o Modifier: f skin Quantity: 1 Electronic Signature(s) Signed: 02/19/2019 1:17:34 PM By: Tobi Bastos Entered By: Tobi Bastos on 02/19/2019 13:17:33

## 2019-02-26 ENCOUNTER — Other Ambulatory Visit: Payer: Self-pay

## 2019-02-26 ENCOUNTER — Encounter: Payer: Medicare Other | Admitting: Internal Medicine

## 2019-02-26 DIAGNOSIS — I87332 Chronic venous hypertension (idiopathic) with ulcer and inflammation of left lower extremity: Secondary | ICD-10-CM | POA: Diagnosis not present

## 2019-03-05 ENCOUNTER — Telehealth: Payer: Self-pay

## 2019-03-05 ENCOUNTER — Other Ambulatory Visit: Payer: Self-pay

## 2019-03-05 ENCOUNTER — Encounter: Payer: Medicare Other | Attending: Internal Medicine | Admitting: Internal Medicine

## 2019-03-05 DIAGNOSIS — I872 Venous insufficiency (chronic) (peripheral): Secondary | ICD-10-CM | POA: Diagnosis not present

## 2019-03-05 DIAGNOSIS — I87332 Chronic venous hypertension (idiopathic) with ulcer and inflammation of left lower extremity: Secondary | ICD-10-CM | POA: Diagnosis not present

## 2019-03-05 DIAGNOSIS — L97221 Non-pressure chronic ulcer of left calf limited to breakdown of skin: Secondary | ICD-10-CM | POA: Insufficient documentation

## 2019-03-05 DIAGNOSIS — L97829 Non-pressure chronic ulcer of other part of left lower leg with unspecified severity: Secondary | ICD-10-CM | POA: Diagnosis not present

## 2019-03-05 DIAGNOSIS — E039 Hypothyroidism, unspecified: Secondary | ICD-10-CM | POA: Diagnosis not present

## 2019-03-05 DIAGNOSIS — L97321 Non-pressure chronic ulcer of left ankle limited to breakdown of skin: Secondary | ICD-10-CM | POA: Insufficient documentation

## 2019-03-05 DIAGNOSIS — I70242 Atherosclerosis of native arteries of left leg with ulceration of calf: Secondary | ICD-10-CM | POA: Diagnosis not present

## 2019-03-05 DIAGNOSIS — H409 Unspecified glaucoma: Secondary | ICD-10-CM | POA: Diagnosis not present

## 2019-03-05 DIAGNOSIS — I129 Hypertensive chronic kidney disease with stage 1 through stage 4 chronic kidney disease, or unspecified chronic kidney disease: Secondary | ICD-10-CM | POA: Diagnosis not present

## 2019-03-05 DIAGNOSIS — N183 Chronic kidney disease, stage 3 (moderate): Secondary | ICD-10-CM | POA: Diagnosis not present

## 2019-03-05 NOTE — Progress Notes (Signed)
CIIN, BRAZZEL ([id number]) Visit Report for 02/26/2019 HPI Details Patient Name: Deanna White, Deanna White Date of Service: 02/26/2019 1:00 PM Medical Record Number: [id number] Patient Account Number: [id number] Date of Birth/Sex: Aug 24, 1925 (85 y.o. F) Treating RN: Cornell Barman Primary Care Provider: Deborra Medina Other Clinician: Referring Provider: Deborra Medina Treating Provider/Extender: Tito Dine in Treatment: 2 History of Present Illness HPI Description: 02/12/2019 Admission This is a very functional 85 year old woman who drove herself to our clinic today. She is concerned about a wound on her left posterior calf. This apparently started 4 to 6 weeks ago at which time she developed increasing swelling and redness in her lower leg. She saw her primary doctor in April who thought this was a venous insufficiency ulcer with some degree of good venous stasis. She did receive antibiotics as well as triamcinolone cream for the inflammation. The patient complains of a lot of pain both in the wound and also in the lateral part of her ankle which is especially worse at night and relieved by standing or walking. She is a nondiabetic and non-smoker. The patient had a venous Doppler on 10/09/2018 that was negative for a DVT and no reflux noted in the greater saphenous vein. No comment on superficial vein thrombosis. Past medical history includes chronic kidney disease stage III, hypothyroidism, glaucoma, history of venous ulcer, hypertension ABIs in our clinic were not obtainable on the left even with the Doppler. 0.56 on the right 5/20-Patient returns to clinic after 1 week, she was started on Prisma with every other day dressing changes, she has noticed pain with leg up, but denies pain with ambulation. Dopplers are scheduled for June 4 and ABIs are not obtained in clinic 5/27 still using Prisma. Arterial studies are booked for June 4. We are still not using compression due to the  complete absence of peripheral pulses in the right foot and a very low ABI. In passing the patient mentioned a very painful area on her lateral right ankle just posterior to the malleolus. She has a very small nodule. Very tender. I thought this might have a core and I removed some eschar on the top of this but there was nothing to drain. This does not look as though it should be so tender. There was no purulence nothing I thought I could culture. I wondered whether this might be a dermatofibroma however I do not think these are painful. As mentioned I saw no evidence of infection in this use Electronic Signature(s) Signed: 02/26/2019 5:01:45 PM By: Linton Ham MD Entered By: Linton Ham on 02/26/2019 14:36:12 SECILY, WALTHOUR ([id number]) -------------------------------------------------------------------------------- Physical Exam Details Patient Name: Deanna White Date of Service: 02/26/2019 1:00 PM Medical Record Number: [id number] Patient Account Number: [id number] Date of Birth/Sex: 1925/07/26 (85 y.o. F) Treating RN: Cornell Barman Primary Care Provider: Deborra Medina Other Clinician: Referring Provider: Deborra Medina Treating Provider/Extender: Tito Dine in Treatment: 2 Constitutional Wide pulse pressure but the patient looks well. Pulse regular and within target range for patient.Marland Kitchen Respirations regular, non- labored and within target range.. Temperature is normal and within the target range for the patient.Marland Kitchen appears in no distress. Eyes Conjunctivae clear. No discharge. Respiratory Respiratory effort is easy and symmetric bilaterally. Rate is normal at rest and on room air.. Cardiovascular Nonpalpable at the left dorsalis pedis or posterior tibial. Integumentary (Hair, Skin) The patient has a small flesh-colored nodule just posterior to the lateral malleolus. Very painful. Psychiatric No evidence of depression, anxiety, or agitation. Calm,  cooperative, and  communicative. Appropriate interactions and affect.. Notes Wound exam; posterior lateral left calf. The ulceration is covered with slough. I did not debride this today pending final arterial review. I think it is been debrided each time she has been here. There is no evidence of surrounding infection. Electronic Signature(s) Signed: 02/26/2019 5:01:45 PM By: Linton Ham MD Entered By: Linton Ham on 02/26/2019 14:38:40 Deanna White ([id number]) -------------------------------------------------------------------------------- Physician Orders Details Patient Name: Deanna White Date of Service: 02/26/2019 1:00 PM Medical Record Number: [id number] Patient Account Number: [id number] Date of Birth/Sex: 1925-09-27 (85 y.o. F) Treating RN: Cornell Barman Primary Care Provider: Deborra Medina Other Clinician: Referring Provider: Deborra Medina Treating Provider/Extender: Tito Dine in Treatment: 2 Verbal / Phone Orders: No Diagnosis Coding Wound Cleansing Wound #1 Left,Posterior Lower Leg o Clean wound with Normal Saline. o May Shower, gently pat wound dry prior to applying new dressing. Anesthetic (add to Medication List) Wound #1 Left,Posterior Lower Leg o Topical Lidocaine 4% cream applied to wound bed prior to debridement (In Clinic Only). Primary Wound Dressing Wound #1 Left,Posterior Lower Leg o Silver Collagen Wound #2 Left,Lateral Malleolus o Other: - tripe antibiotic Secondary Dressing Wound #1 Left,Posterior Lower Leg o Conform/Kerlix - Lightly to secure collagen in place. Wound #2 Left,Lateral Malleolus o Other - coverlet Dressing Change Frequency Wound #1 Left,Posterior Lower Leg o Change Dressing Monday, Wednesday, Friday Wound #2 Left,Lateral Malleolus o Change Dressing Monday, Wednesday, Friday Follow-up Appointments Wound #1 Left,Posterior Lower Leg o Return Appointment in 1 week. Wound #2 Left,Lateral Malleolus o Return  Appointment in 1 week. Edema Control Wound #1 Left,Posterior Lower Leg o Elevate legs to the level of the heart and pump ankles as often as possible Wound #2 Left,Lateral Malleolus ELASHA, TESS ([id number]) o Elevate legs to the level of the heart and pump ankles as often as possible Electronic Signature(s) Signed: 02/26/2019 5:01:45 PM By: Linton Ham MD Signed: 03/04/2019 5:54:26 PM By: Gretta Cool, BSN, RN, CWS, Kim RN, BSN Entered By: Gretta Cool, BSN, RN, CWS, Kim on 02/26/2019 13:43:05 Deanna White ([id number]) -------------------------------------------------------------------------------- Problem List Details Patient Name: Deanna White Date of Service: 02/26/2019 1:00 PM Medical Record Number: [id number] Patient Account Number: [id number] Date of Birth/Sex: 02/16/1925 (85 y.o. F) Treating RN: Cornell Barman Primary Care Provider: Deborra Medina Other Clinician: Referring Provider: Deborra Medina Treating Provider/Extender: Tito Dine in Treatment: 2 Active Problems ICD-10 Evaluated Encounter Code Description Active Date Today Diagnosis L97.221 Non-pressure chronic ulcer of left calf limited to breakdown of 02/12/2019 No Yes skin I87.332 Chronic venous hypertension (idiopathic) with ulcer and 02/12/2019 No Yes inflammation of left lower extremity I70.242 Atherosclerosis of native arteries of left leg with ulceration of 02/12/2019 No Yes calf Inactive Problems Resolved Problems Electronic Signature(s) Signed: 02/26/2019 5:01:45 PM By: Linton Ham MD Entered By: Linton Ham on 02/26/2019 14:31:44 Deanna White ([id number]) -------------------------------------------------------------------------------- Progress Note Details Patient Name: Deanna White Date of Service: 02/26/2019 1:00 PM Medical Record Number: [id number] Patient Account Number: [id number] Date of Birth/Sex: 1925/08/01 (85 y.o. F) Treating RN: Cornell Barman Primary Care Provider: Deborra Medina  Other Clinician: Referring Provider: Deborra Medina Treating Provider/Extender: Tito Dine in Treatment: 2 Subjective History of Present Illness (HPI) 02/12/2019 Admission This is a very functional 85 year old woman who drove herself to our clinic today. She is concerned about a wound on her left posterior calf. This apparently started 4 to 6 weeks ago at which time she developed increasing swelling and redness in her  lower leg. She saw her primary doctor in April who thought this was a venous insufficiency ulcer with some degree of good venous stasis. She did receive antibiotics as well as triamcinolone cream for the inflammation. The patient complains of a lot of pain both in the wound and also in the lateral part of her ankle which is especially worse at night and relieved by standing or walking. She is a nondiabetic and non-smoker. The patient had a venous Doppler on 10/09/2018 that was negative for a DVT and no reflux noted in the greater saphenous vein. No comment on superficial vein thrombosis. Past medical history includes chronic kidney disease stage III, hypothyroidism, glaucoma, history of venous ulcer, hypertension ABIs in our clinic were not obtainable on the left even with the Doppler. 0.56 on the right 5/20-Patient returns to clinic after 1 week, she was started on Prisma with every other day dressing changes, she has noticed pain with leg up, but denies pain with ambulation. Dopplers are scheduled for June 4 and ABIs are not obtained in clinic 5/27 still using Prisma. Arterial studies are booked for June 4. We are still not using compression due to the complete absence of peripheral pulses in the right foot and a very low ABI. In passing the patient mentioned a very painful area on her lateral right ankle just posterior to the malleolus. She has a very small nodule. Very tender. I thought this might have a core and I removed some eschar on the top of this but there  was nothing to drain. This does not look as though it should be so tender. There was no purulence nothing I thought I could culture. I wondered whether this might be a dermatofibroma however I do not think these are painful. As mentioned I saw no evidence of infection in this use Objective Constitutional Wide pulse pressure but the patient looks well. Pulse regular and within target range for patient.Marland Kitchen Respirations regular, non- labored and within target range.. Temperature is normal and within the target range for the patient.Marland Kitchen appears in no distress. Vitals Time Taken: 1:18 PM, Height: 62 in, Weight: 118 lbs, BMI: 21.6, Temperature: 98.0 F, Pulse: 67 bpm, Respiratory Rate: 16 breaths/min, Blood Pressure: 150/45 mmHg. NAVIL, KOLE ([id number]) Eyes Conjunctivae clear. No discharge. Respiratory Respiratory effort is easy and symmetric bilaterally. Rate is normal at rest and on room air.. Cardiovascular Nonpalpable at the left dorsalis pedis or posterior tibial. Psychiatric No evidence of depression, anxiety, or agitation. Calm, cooperative, and communicative. Appropriate interactions and affect.. General Notes: Wound exam; posterior lateral left calf. The ulceration is covered with slough. I did not debride this today pending final arterial review. I think it is been debrided each time she has been here. There is no evidence of surrounding infection. Integumentary (Hair, Skin) The patient has a small flesh-colored nodule just posterior to the lateral malleolus. Very painful. Wound #1 status is Open. Original cause of wound was Gradually Appeared. The wound is located on the Left,Posterior Lower Leg. The wound measures 0.8cm length x 0.5cm width x 0.1cm depth; 0.314cm^2 area and 0.031cm^3 volume. There is Fat Layer (Subcutaneous Tissue) Exposed exposed. There is no tunneling or undermining noted. There is a medium amount of sanguinous drainage noted. The wound margin is flat and intact.  There is no granulation within the wound bed. There is a large (67-100%) amount of necrotic tissue within the wound bed including Adherent Slough. Wound #2 status is Open. Original cause of wound was Gradually Appeared.  The wound is located on the Left,Lateral Malleolus. The wound measures 0.1cm length x 0.1cm width x 0.1cm depth; 0.008cm^2 area and 0.001cm^3 volume. There is no tunneling or undermining noted. There is a none present amount of drainage noted. The wound margin is indistinct and nonvisible. There is no granulation within the wound bed. There is no necrotic tissue within the wound bed. General Notes: Hard nodule, no pus or drainage. Assessment Active Problems ICD-10 Non-pressure chronic ulcer of left calf limited to breakdown of skin Chronic venous hypertension (idiopathic) with ulcer and inflammation of left lower extremity Atherosclerosis of native arteries of left leg with ulceration of calf Plan Wound Cleansing: Wound #1 Left,Posterior Lower Leg: Clean wound with Normal Saline. May Shower, gently pat wound dry prior to applying new dressing. Anesthetic (add to Medication List): Wound #1 Left,Posterior Lower Leg: MYIAH, PETKUS. ([id number]) Topical Lidocaine 4% cream applied to wound bed prior to debridement (In Clinic Only). Primary Wound Dressing: Wound #1 Left,Posterior Lower Leg: Silver Collagen Wound #2 Left,Lateral Malleolus: Other: - tripe antibiotic Secondary Dressing: Wound #1 Left,Posterior Lower Leg: Conform/Kerlix - Lightly to secure collagen in place. Wound #2 Left,Lateral Malleolus: Other - coverlet Dressing Change Frequency: Wound #1 Left,Posterior Lower Leg: Change Dressing Monday, Wednesday, Friday Wound #2 Left,Lateral Malleolus: Change Dressing Monday, Wednesday, Friday Follow-up Appointments: Wound #1 Left,Posterior Lower Leg: Return Appointment in 1 week. Wound #2 Left,Lateral Malleolus: Return Appointment in 1 week. Edema  Control: Wound #1 Left,Posterior Lower Leg: Elevate legs to the level of the heart and pump ankles as often as possible Wound #2 Left,Lateral Malleolus: Elevate legs to the level of the heart and pump ankles as often as possible 1. I continued with the Prisma/ Kerlix/conform. 2. No more aggressive compression pending arterial review. 3. I think the patient has a combination of chronic venous and arterial insufficiency 4. The nodule on her posterior left calf was of uncertain etiology. This was exquisitely painful but did not really look like it should be that tender. Small flesh-colored nodule with what looked to be a crater on the top but I could not express anything. This did not dimple Electronic Signature(s) Signed: 02/26/2019 5:01:45 PM By: Linton Ham MD Entered By: Linton Ham on 02/26/2019 14:41:25 Deanna White ([id number]) -------------------------------------------------------------------------------- SuperBill Details Patient Name: Deanna White Date of Service: 02/26/2019 Medical Record Number: [id number] Patient Account Number: [id number] Date of Birth/Sex: 1925/03/24 (85 y.o. F) Treating RN: Cornell Barman Primary Care Provider: Deborra Medina Other Clinician: Referring Provider: Deborra Medina Treating Provider/Extender: Tito Dine in Treatment: 2 Diagnosis Coding ICD-10 Codes Code Description [phone] Non-pressure chronic ulcer of left calf limited to breakdown of skin I87.332 Chronic venous hypertension (idiopathic) with ulcer and inflammation of left lower extremity I70.242 Atherosclerosis of native arteries of left leg with ulceration of calf Facility Procedures CPT4 Code: [id number] Description: [phone] - WOUND CARE VISIT-LEV 2 EST PT Modifier: Quantity: 1 Physician Procedures CPT4: Description Modifier Quantity Code [id number] - WC PHYS LEVEL 3 - EST PT 1 ICD-10 Diagnosis Description L97.221 Non-pressure chronic ulcer of left calf limited to  breakdown of skin I87.332 Chronic venous hypertension (idiopathic) with ulcer and  inflammation of left lower extremity I70.242 Atherosclerosis of native arteries of left leg with ulceration of calf Electronic Signature(s) Signed: 02/26/2019 5:01:45 PM By: Linton Ham MD Entered By: Linton Ham on 02/26/2019 14:41:53

## 2019-03-05 NOTE — Progress Notes (Signed)
DAMARYS, SPEIR ([id number]) Visit Report for 02/26/2019 Arrival Information Details Patient Name: Deanna White, Deanna White Date of Service: 02/26/2019 1:00 PM Medical Record Number: [id number] Patient Account Number: [id number] Date of Birth/Sex: 1925/08/17 (85 y.o. F) Treating RN: Harold Barban Primary Care Maybree Riling: Deborra Medina Other Clinician: Referring Quenten Nawaz: Deborra Medina Treating Akshith Moncus/Extender: Tito Dine in Treatment: 2 Visit Information History Since Last Visit Added or deleted any medications: No Patient Arrived: Ambulatory Any new allergies or adverse reactions: No Arrival Time: 12:59 Had a fall or experienced change in No Accompanied By: self activities of daily living that may affect Transfer Assistance: None risk of falls: Patient Identification Verified: Yes Signs or symptoms of abuse/neglect since last visito No Secondary Verification Process Completed: Yes Hospitalized since last visit: No Has Dressing in Place as Prescribed: Yes Pain Present Now: Yes Electronic Signature(White) Signed: 02/26/2019 4:36:37 PM By: Harold Barban Entered By: Harold Barban on 02/26/2019 13:17:25 Deanna White ([id number]) -------------------------------------------------------------------------------- Clinic Level of Care Assessment Details Patient Name: Deanna White Date of Service: 02/26/2019 1:00 PM Medical Record Number: [id number] Patient Account Number: [id number] Date of Birth/Sex: 1925-07-06 (85 y.o. F) Treating RN: Cornell Barman Primary Care Huntley Knoop: Deborra Medina Other Clinician: Referring Aiyden Lauderback: Deborra Medina Treating Georganne Siple/Extender: Tito Dine in Treatment: 2 Clinic Level of Care Assessment Items TOOL 4 Quantity Score []  - Use when only an EandM is performed on FOLLOW-UP visit 0 ASSESSMENTS - Nursing Assessment / Reassessment []  - Reassessment of Co-morbidities (includes updates in patient status) 0 X- 1 5 Reassessment of Adherence  to Treatment Plan ASSESSMENTS - Wound and Skin Assessment / Reassessment X - Simple Wound Assessment / Reassessment - one wound 1 5 []  - 0 Complex Wound Assessment / Reassessment - multiple wounds []  - 0 Dermatologic / Skin Assessment (not related to wound area) ASSESSMENTS - Focused Assessment []  - Circumferential Edema Measurements - multi extremities 0 []  - 0 Nutritional Assessment / Counseling / Intervention []  - 0 Lower Extremity Assessment (monofilament, tuning fork, pulses) []  - 0 Peripheral Arterial Disease Assessment (using hand held doppler) ASSESSMENTS - Ostomy and/or Continence Assessment and Care []  - Incontinence Assessment and Management 0 []  - 0 Ostomy Care Assessment and Management (repouching, etc.) PROCESS - Coordination of Care X - Simple Patient / Family Education for ongoing care 1 15 []  - 0 Complex (extensive) Patient / Family Education for ongoing care []  - 0 Staff obtains Programmer, systems, Records, Test Results / Process Orders []  - 0 Staff telephones HHA, Nursing Homes / Clarify orders / etc []  - 0 Routine Transfer to another Facility (non-emergent condition) []  - 0 Routine Hospital Admission (non-emergent condition) []  - 0 New Admissions / Biomedical engineer / Ordering NPWT, Apligraf, etc. []  - 0 Emergency Hospital Admission (emergent condition) X- 1 10 Simple Discharge Coordination Deanna White, Deanna White. ([id number]) []  - 0 Complex (extensive) Discharge Coordination PROCESS - Special Needs []  - Pediatric / Minor Patient Management 0 []  - 0 Isolation Patient Management []  - 0 Hearing / Language / Visual special needs []  - 0 Assessment of Community assistance (transportation, D/C planning, etc.) []  - 0 Additional assistance / Altered mentation []  - 0 Support Surface(White) Assessment (bed, cushion, seat, etc.) INTERVENTIONS - Wound Cleansing / Measurement X - Simple Wound Cleansing - one wound 1 5 []  - 0 Complex Wound Cleansing - multiple wounds X-  1 5 Wound Imaging (photographs - any number of wounds) []  - 0 Wound Tracing (instead of photographs) X- 1 5 Simple Wound Measurement -  one wound []  - 0 Complex Wound Measurement - multiple wounds INTERVENTIONS - Wound Dressings []  - Small Wound Dressing one or multiple wounds 0 X- 1 15 Medium Wound Dressing one or multiple wounds []  - 0 Large Wound Dressing one or multiple wounds []  - 0 Application of Medications - topical []  - 0 Application of Medications - injection INTERVENTIONS - Miscellaneous []  - External ear exam 0 []  - 0 Specimen Collection (cultures, biopsies, blood, body fluids, etc.) []  - 0 Specimen(White) / Culture(White) sent or taken to Lab for analysis []  - 0 Patient Transfer (multiple staff / Civil Service fast streamer / Similar devices) []  - 0 Simple Staple / Suture removal (25 or less) []  - 0 Complex Staple / Suture removal (26 or more) []  - 0 Hypo / Hyperglycemic Management (close monitor of Blood Glucose) []  - 0 Ankle / Brachial Index (ABI) - do not check if billed separately X- 1 5 Vital Signs Deanna White, Deanna White. ([id number]) Has the patient been seen at the hospital within the last three years: Yes Total Score: 70 Level Of Care: New/Established - Level 2 Electronic Signature(White) Signed: 03/04/2019 5:54:26 PM By: Gretta Cool, BSN, RN, CWS, Kim RN, BSN Entered By: Gretta Cool, BSN, RN, CWS, Kim on 02/26/2019 13:43:38 Deanna White ([id number]) -------------------------------------------------------------------------------- Encounter Discharge Information Details Patient Name: Deanna White Date of Service: 02/26/2019 1:00 PM Medical Record Number: [id number] Patient Account Number: [id number] Date of Birth/Sex: June 07, 1925 (85 y.o. F) Treating RN: Harold Barban Primary Care Leila Schuff: Deborra Medina Other Clinician: Referring Noe Goyer: Deborra Medina Treating Priscille Shadduck/Extender: Tito Dine in Treatment: 2 Encounter Discharge Information Items Discharge Condition:  Stable Ambulatory Status: Ambulatory Discharge Destination: Home Transportation: Private Auto Accompanied By: self Schedule Follow-up Appointment: Yes Clinical Summary of Care: Electronic Signature(White) Signed: 02/26/2019 4:36:37 PM By: Harold Barban Entered By: Harold Barban on 02/26/2019 13:51:07 Deanna White ([id number]) -------------------------------------------------------------------------------- Lower Extremity Assessment Details Patient Name: Deanna White Date of Service: 02/26/2019 1:00 PM Medical Record Number: [id number] Patient Account Number: [id number] Date of Birth/Sex: [birth year] (85 y.o. F) Treating RN: Harold Barban Primary Care Ranald Alessio: Deborra Medina Other Clinician: Referring Wilton Thrall: Deborra Medina Treating Jovaun Levene/Extender: Ricard Dillon Weeks in Treatment: 2 Vascular Assessment Pulses: Dorsalis Pedis Palpable: [Left:Yes] Posterior Tibial Palpable: [Left:Yes] Electronic Signature(White) Signed: 02/26/2019 4:36:37 PM By: Harold Barban Entered By: Harold Barban on 02/26/2019 13:20:26 Deanna White ([id number]) -------------------------------------------------------------------------------- Multi Wound Chart Details Patient Name: Deanna White Date of Service: 02/26/2019 1:00 PM Medical Record Number: [id number] Patient Account Number: [id number] Date of Birth/Sex: 04/08/1925 (85 y.o. F) Treating RN: Cornell Barman Primary Care Robertta Halfhill: Deborra Medina Other Clinician: Referring Lucielle Vokes: Deborra Medina Treating Sidhant Helderman/Extender: Tito Dine in Treatment: 2 Vital Signs Height(in): 62 Pulse(bpm): 13 Weight(lbs): 118 Blood Pressure(mmHg): 150/45 Body Mass Index(BMI): 22 Temperature(F): 98.0 Respiratory Rate 16 (breaths/min): Photos: [N/A:N/A] Wound Location: Left Lower Leg - Posterior Left Malleolus - Lateral N/A Wounding Event: Gradually Appeared Gradually Appeared N/A Primary Etiology: Venous Leg Ulcer To be determined  N/A Comorbid History: Cataracts, Hypertension Cataracts, Hypertension N/A Date Acquired: 10/02/2018 09/30/2018 N/A Weeks of Treatment: 2 0 N/A Wound Status: Open Open N/A Measurements L x W x D 0.8x0.5x0.1 0.1x0.1x0.1 N/A (cm) Area (cm) : 0.314 0.008 N/A Volume (cm) : 0.031 0.001 N/A % Reduction in Area: 11.00% 0.00% N/A % Reduction in Volume: 11.40% 0.00% N/A Classification: Full Thickness Without Unclassifiable N/A Exposed Support Structures Exudate Amount: Medium None Present N/A Exudate Type: Sanguinous N/A N/A Exudate Color: red N/A N/A Wound Margin: Flat  and Intact Indistinct, nonvisible N/A Granulation Amount: None Present (0%) None Present (0%) N/A Necrotic Amount: Large (67-100%) None Present (0%) N/A Exposed Structures: Fat Layer (Subcutaneous Fascia: No N/A Tissue) Exposed: Yes Fat Layer (Subcutaneous Fascia: No Tissue) Exposed: No Tendon: No Tendon: No Muscle: No Muscle: No Joint: No Joint: No Bone: No Bone: No Epithelialization: None Large (67-100%) N/A Deanna White, Deanna White. ([id number]) Assessment Notes: N/A Hard nodule, no pus or N/A drainage. Treatment Notes Wound #1 (Left, Posterior Lower Leg) Notes ABX ointment and bandaid to Left lateral mallelous, Silver collagen ABD, conform, tape to Left posterior leg Wound #2 (Left, Lateral Malleolus) Notes ABX ointment and bandaid to Left lateral mallelous, Silver collagen ABD, conform, tape to Left posterior leg Electronic Signature(White) Signed: 02/26/2019 5:01:45 PM By: Linton Ham MD Entered By: Linton Ham on 02/26/2019 14:31:50 Deanna White ([id number]) -------------------------------------------------------------------------------- [place] Details Patient Name: Deanna White Date of Service: 02/26/2019 1:00 PM Medical Record Number: [id number] Patient Account Number: [id number] Date of Birth/Sex: [birth year] (85 y.o. F) Treating RN: Cornell Barman Primary Care Caliann Leckrone: Deborra Medina Other Clinician: Referring Reise Hietala: Deborra Medina Treating Janelis Stelzer/Extender: Tito Dine in Treatment: 2 Active Inactive Necrotic Tissue Nursing Diagnoses: Impaired tissue integrity related to necrotic/devitalized tissue Knowledge deficit related to management of necrotic/devitalized tissue Goals: Necrotic/devitalized tissue will be minimized in the wound bed Date Initiated: 02/12/2019 Target Resolution Date: 02/19/2019 Goal Status: Active Interventions: Assess patient pain level pre-, during and post procedure and prior to discharge Treatment Activities: Apply topical anesthetic as ordered : 02/12/2019 Excisional debridement : 02/12/2019 Notes: Orientation to the Wound Care Program Nursing Diagnoses: Knowledge deficit related to the wound healing center program Goals: Patient/caregiver will verbalize understanding of the [place] Date Initiated: 02/12/2019 Target Resolution Date: 02/12/2019 Goal Status: Active Interventions: Provide education on orientation to the wound center Notes: Pain, Acute or Chronic Nursing Diagnoses: Pain, acute or chronic: actual or potential Goals: Patient will verbalize adequate pain control and receive pain control interventions during procedures as needed Deanna White, Deanna White ([id number]) Date Initiated: 02/12/2019 Target Resolution Date: 02/19/2019 Goal Status: Active Interventions: Assess comfort goal upon admission Notes: Wound/Skin Impairment Nursing Diagnoses: Impaired tissue integrity Goals: Patient/caregiver will verbalize understanding of skin care regimen Date Initiated: 02/12/2019 Target Resolution Date: 02/19/2019 Goal Status: Active Ulcer/skin breakdown will have a volume reduction of 30% by week 4 Date Initiated: 02/12/2019 Target Resolution Date: 03/15/2019 Goal Status: Active Interventions: Assess ulceration(White) every visit Notes: Electronic Signature(White) Signed: 03/04/2019 5:54:26 PM By:  Gretta Cool, BSN, RN, CWS, Kim RN, BSN Entered By: Gretta Cool, BSN, RN, CWS, Kim on 02/26/2019 13:35:02 Deanna White ([id number]) -------------------------------------------------------------------------------- Pain Assessment Details Patient Name: Deanna White Date of Service: 02/26/2019 1:00 PM Medical Record Number: [id number] Patient Account Number: [id number] Date of Birth/Sex: Nov 28, 1924 (85 y.o. F) Treating RN: Harold Barban Primary Care Courtlynn Holloman: Deborra Medina Other Clinician: Referring Maryrose Colvin: Deborra Medina Treating Tangee Marszalek/Extender: Tito Dine in Treatment: 2 Active Problems Location of Pain Severity and Description of Pain Patient Has Paino No Site Locations Pain Management and Medication Current Pain Management: Electronic Signature(White) Signed: 02/26/2019 4:36:37 PM By: Harold Barban Entered By: Harold Barban on 02/26/2019 13:17:42 Deanna White ([id number]) -------------------------------------------------------------------------------- Patient/Caregiver Education Details Patient Name: Deanna White Date of Service: 02/26/2019 1:00 PM Medical Record Number: [id number] Patient Account Number: [id number] Date of Birth/Gender: 11-22-1924 (85 y.o. F) Treating RN: Cornell Barman Primary Care Physician: Deborra Medina Other Clinician: Referring Physician: Deborra Medina Treating Physician/Extender: Tito Dine  in Treatment: 2 Education Assessment Education Provided To: Patient Education Topics Provided Wound/Skin Impairment: Handouts: Caring for Your Ulcer Methods: Demonstration, Explain/Verbal Responses: State content correctly Electronic Signature(White) Signed: 03/04/2019 5:54:26 PM By: Gretta Cool, BSN, RN, CWS, Kim RN, BSN Entered By: Gretta Cool, BSN, RN, CWS, Kim on 02/26/2019 13:44:02 Deanna White ([id number]) -------------------------------------------------------------------------------- Wound Assessment Details Patient Name: Deanna White Date  of Service: 02/26/2019 1:00 PM Medical Record Number: [id number] Patient Account Number: [id number] Date of Birth/Sex: Aug 16, 1925 (85 y.o. F) Treating RN: Harold Barban Primary Care Samiha Denapoli: Deborra Medina Other Clinician: Referring Jetaun Colbath: Deborra Medina Treating Jilberto Vanderwall/Extender: Tito Dine in Treatment: 2 Wound Status Wound Number: 1 Primary Etiology: Venous Leg Ulcer Wound Location: Left Lower Leg - Posterior Wound Status: Open Wounding Event: Gradually Appeared Comorbid History: Cataracts, Hypertension Date Acquired: 10/02/2018 Weeks Of Treatment: 2 Clustered Wound: No Photos Wound Measurements Length: (cm) 0.8 Width: (cm) 0.5 Depth: (cm) 0.1 Area: (cm) 0.314 Volume: (cm) 0.031 % Reduction in Area: 11% % Reduction in Volume: 11.4% Epithelialization: None Tunneling: No Undermining: No Wound Description Full Thickness Without Exposed Support Classification: Structures Wound Margin: Flat and Intact Exudate Medium Amount: Exudate Type: Sanguinous Exudate Color: red Foul Odor After Cleansing: No Slough/Fibrino Yes Wound Bed Granulation Amount: None Present (0%) Exposed Structure Necrotic Amount: Large (67-100%) Fascia Exposed: No Necrotic Quality: Adherent Slough Fat Layer (Subcutaneous Tissue) Exposed: Yes Tendon Exposed: No Muscle Exposed: No Joint Exposed: No Bone Exposed: No White, Deanna White. ([id number]) Treatment Notes Wound #1 (Left, Posterior Lower Leg) Notes ABX ointment and bandaid to Left lateral mallelous, Silver collagen ABD, conform, tape to Left posterior leg Electronic Signature(White) Signed: 02/26/2019 4:36:37 PM By: Harold Barban Entered By: Harold Barban on 02/26/2019 13:23:18 Deanna White ([id number]) -------------------------------------------------------------------------------- Wound Assessment Details Patient Name: Deanna White Date of Service: 02/26/2019 1:00 PM Medical Record Number: [id number] Patient  Account Number: [id number] Date of Birth/Sex: 25-Mar-1925 (85 y.o. F) Treating RN: Cornell Barman Primary Care Tc Kapusta: Deborra Medina Other Clinician: Referring Jonia Oakey: Deborra Medina Treating Remee Charley/Extender: Tito Dine in Treatment: 2 Wound Status Wound Number: 2 Primary Etiology: To be determined Wound Location: Left Malleolus - Lateral Wound Status: Open Wounding Event: Gradually Appeared Comorbid History: Cataracts, Hypertension Date Acquired: 09/30/2018 Weeks Of Treatment: 0 Clustered Wound: No Photos Wound Measurements Length: (cm) 0.1 Width: (cm) 0.1 Depth: (cm) 0.1 Area: (cm) 0.008 Volume: (cm) 0.001 % Reduction in Area: 0% % Reduction in Volume: 0% Epithelialization: Large (67-100%) Tunneling: No Undermining: No Wound Description Classification: Unclassifiable Wound Margin: Indistinct, nonvisible Exudate Amount: None Present Wound Bed Granulation Amount: None Present (0%) Exposed Structure Necrotic Amount: None Present (0%) Fascia Exposed: No Fat Layer (Subcutaneous Tissue) Exposed: No Tendon Exposed: No Muscle Exposed: No Joint Exposed: No Bone Exposed: No Assessment Notes Hard nodule, no pus or drainage. Treatment Notes Deanna White, Deanna White ([id number]) Wound #2 (Left, Lateral Malleolus) Notes ABX ointment and bandaid to Left lateral mallelous, Silver collagen ABD, conform, tape to Left posterior leg Electronic Signature(White) Signed: 03/04/2019 5:54:26 PM By: Gretta Cool, BSN, RN, CWS, Kim RN, BSN Entered By: Gretta Cool, BSN, RN, CWS, Kim on 02/26/2019 13:41:42 Deanna White, Deanna White ([id number]) -------------------------------------------------------------------------------- [place] Details Patient Name: Deanna White Date of Service: 02/26/2019 1:00 PM Medical Record Number: [id number] Patient Account Number: [id number] Date of Birth/Sex: 1925/08/23 (85 y.o. F) Treating RN: Harold Barban Primary Care Jaylise Peek: Deborra Medina Other Clinician: Referring  Nezar Buckles: Deborra Medina Treating Kylee Nardozzi/Extender: Tito Dine in Treatment: 2 Vital Signs Time Taken: 13:18 Temperature (F): 98.0  Height (in): 62 Pulse (bpm): 67 Weight (lbs): 118 Respiratory Rate (breaths/min): 16 Body Mass Index (BMI): 21.6 Blood Pressure (mmHg): 150/45 Reference Range: 80 - 120 mg / dl Electronic Signature(White) Signed: 02/26/2019 4:36:37 PM By: Harold Barban Entered By: Harold Barban on 02/26/2019 13:19:51

## 2019-03-05 NOTE — Telephone Encounter (Signed)

## 2019-03-05 NOTE — Telephone Encounter (Signed)
Called patient.  No answer. LMOV.  Need to change appointment to an evisit.

## 2019-03-06 ENCOUNTER — Ambulatory Visit (INDEPENDENT_AMBULATORY_CARE_PROVIDER_SITE_OTHER): Payer: Medicare Other

## 2019-03-06 ENCOUNTER — Other Ambulatory Visit (INDEPENDENT_AMBULATORY_CARE_PROVIDER_SITE_OTHER): Payer: Self-pay | Admitting: Vascular Surgery

## 2019-03-06 ENCOUNTER — Ambulatory Visit (INDEPENDENT_AMBULATORY_CARE_PROVIDER_SITE_OTHER): Payer: Medicare Other | Admitting: Vascular Surgery

## 2019-03-06 ENCOUNTER — Encounter (INDEPENDENT_AMBULATORY_CARE_PROVIDER_SITE_OTHER): Payer: Self-pay | Admitting: Vascular Surgery

## 2019-03-06 ENCOUNTER — Encounter

## 2019-03-06 VITALS — BP 208/64 | HR 74 | Resp 15 | Wt 116.0 lb

## 2019-03-06 DIAGNOSIS — I70201 Unspecified atherosclerosis of native arteries of extremities, right leg: Secondary | ICD-10-CM | POA: Diagnosis not present

## 2019-03-06 DIAGNOSIS — S81809A Unspecified open wound, unspecified lower leg, initial encounter: Secondary | ICD-10-CM

## 2019-03-06 DIAGNOSIS — I70245 Atherosclerosis of native arteries of left leg with ulceration of other part of foot: Secondary | ICD-10-CM

## 2019-03-06 DIAGNOSIS — I7025 Atherosclerosis of native arteries of other extremities with ulceration: Secondary | ICD-10-CM

## 2019-03-06 DIAGNOSIS — L97909 Non-pressure chronic ulcer of unspecified part of unspecified lower leg with unspecified severity: Secondary | ICD-10-CM

## 2019-03-06 DIAGNOSIS — I480 Paroxysmal atrial fibrillation: Secondary | ICD-10-CM

## 2019-03-06 DIAGNOSIS — I10 Essential (primary) hypertension: Secondary | ICD-10-CM

## 2019-03-06 DIAGNOSIS — I872 Venous insufficiency (chronic) (peripheral): Secondary | ICD-10-CM

## 2019-03-06 DIAGNOSIS — Z79899 Other long term (current) drug therapy: Secondary | ICD-10-CM

## 2019-03-06 DIAGNOSIS — L97529 Non-pressure chronic ulcer of other part of left foot with unspecified severity: Secondary | ICD-10-CM

## 2019-03-06 NOTE — Progress Notes (Addendum)
ERIN, OBANDO ([id number]) Visit Report for 03/05/2019 Arrival Information Details Patient Name: Deanna White, Deanna White Date of Service: 03/05/2019 10:15 AM Medical Record Number: [id number] Patient Account Number: [id number] Date of Birth/Sex: Oct 20, 1924 (85 y.o. F) Treating RN: Cornell Barman Primary Care Eriko Economos: Deborra Medina Other Clinician: Referring Jamarii Banks: Deborra Medina Treating Tierany Appleby/Extender: Tito Dine in Treatment: 3 Visit Information History Since Last Visit All ordered tests and consults were completed: No Patient Arrived: Ambulatory Added or deleted any medications: No Arrival Time: 10:17 Any new allergies or adverse reactions: No Accompanied By: self Had a fall or experienced change in No Transfer Assistance: None activities of daily living that may affect Patient Identification Verified: Yes risk of falls: Secondary Verification Process Yes Has Dressing in Place as Prescribed: Yes Completed: Pain Present Now: No Patient Has Alerts: Yes Patient Alerts: Patient on Blood Thinner Aspirin 81 mg Electronic Signature(s) Signed: 03/05/2019 4:58:40 PM By: Gretta Cool, BSN, RN, CWS, Kim RN, BSN Previous Signature: 03/05/2019 4:50:33 PM Version By: Gretta Cool, BSN, RN, CWS, Kim RN, BSN Entered By: Gretta Cool, BSN, RN, CWS, Kim on 03/05/2019 16:58:40 Deanna White ([id number]) -------------------------------------------------------------------------------- Encounter Discharge Information Details Patient Name: Deanna White Date of Service: 03/05/2019 10:15 AM Medical Record Number: [id number] Patient Account Number: [id number] Date of Birth/Sex: 01/27/1925 (85 y.o. F) Treating RN: Cornell Barman Primary Care Karrina Lye: Deborra Medina Other Clinician: Referring Fenna Semel: Deborra Medina Treating Rion Catala/Extender: Tito Dine in Treatment: 3 Encounter Discharge Information Items Post Procedure Vitals Discharge Condition: Stable Unable to obtain vitals Reason:  oversite Ambulatory Status: Ambulatory Discharge Destination: Home Transportation: Private Auto Accompanied By: self Schedule Follow-up Appointment: Yes Clinical Summary of Care: Electronic Signature(s) Signed: 03/05/2019 5:02:26 PM By: Gretta Cool, BSN, RN, CWS, Kim RN, BSN Previous Signature: 03/05/2019 4:50:33 PM Version By: Gretta Cool, BSN, RN, CWS, Kim RN, BSN Entered By: Gretta Cool, BSN, RN, CWS, Kim on 03/05/2019 17:02:26 Deanna White ([id number]) -------------------------------------------------------------------------------- Lower Extremity Assessment Details Patient Name: Deanna White Date of Service: 03/05/2019 10:15 AM Medical Record Number: [id number] Patient Account Number: [id number] Date of Birth/Sex: [birth year] (85 y.o. F) Treating RN: Cornell Barman Primary Care Connery Shiffler: Deborra Medina Other Clinician: Referring Izan Miron: Deborra Medina Treating Madinah Quarry/Extender: Tito Dine in Treatment: 3 Edema Assessment Assessed: [Left: No] [Right: No] Edema: [Left: N] [Right: o] Vascular Assessment Pulses: Dorsalis Pedis Palpable: [Left:Yes] Posterior Tibial Palpable: [Left:Yes] Electronic Signature(s) Signed: 03/05/2019 4:59:07 PM By: Gretta Cool, BSN, RN, CWS, Kim RN, BSN Previous Signature: 03/05/2019 4:50:33 PM Version By: Gretta Cool, BSN, RN, CWS, Kim RN, BSN Entered By: Gretta Cool, BSN, RN, CWS, Kim on 03/05/2019 16:59:07 Deanna White ([id number]) -------------------------------------------------------------------------------- Multi Wound Chart Details Patient Name: Deanna White Date of Service: 03/05/2019 10:15 AM Medical Record Number: [id number] Patient Account Number: [id number] Date of Birth/Sex: 12-29-1924 (85 y.o. F) Treating RN: Cornell Barman Primary Care Penney Domanski: Deborra Medina Other Clinician: Referring Margeaux Swantek: Deborra Medina Treating Harinder Romas/Extender: Tito Dine in Treatment: 3 Vital Signs Height(in): 62 Pulse(bpm): 74 Weight(lbs): 118 Blood Pressure(mmHg):  162/52 Body Mass Index(BMI): 22 Temperature(F): 98.4 Respiratory Rate 16 (breaths/min): Photos: [N/A:N/A] Wound Location: Left Lower Leg - Posterior Left Malleolus - Lateral N/A Wounding Event: Gradually Appeared Gradually Appeared N/A Primary Etiology: Venous Leg Ulcer To be determined N/A Comorbid History: Cataracts, Hypertension Cataracts, Hypertension N/A Date Acquired: 10/02/2018 09/30/2018 N/A Weeks of Treatment: 3 1 N/A Wound Status: Open Open N/A Measurements L x W x D 1.1x0.6x0.2 0.5x0.5x0.1 N/A (cm) Area (cm) : 0.518 0.196 N/A Volume (cm) : 0.104 0.02 N/A %  Reduction in Area: -46.70% -2350.00% N/A % Reduction in Volume: -197.10% -1900.00% N/A Classification: Full Thickness Without Unclassifiable N/A Exposed Support Structures Exudate Amount: Medium None Present N/A Exudate Type: Sanguinous N/A N/A Exudate Color: red N/A N/A Wound Margin: Flat and Intact Indistinct, nonvisible N/A Granulation Amount: None Present (0%) None Present (0%) N/A Necrotic Amount: Large (67-100%) Small (1-33%) N/A Exposed Structures: Fat Layer (Subcutaneous Fascia: No N/A Tissue) Exposed: Yes Fat Layer (Subcutaneous Fascia: No Tissue) Exposed: No Tendon: No Tendon: No Muscle: No Muscle: No Joint: No Joint: No Bone: No Bone: No Epithelialization: None Large (67-100%) N/A RIHANA, KIDDY ([id number]) Debridement: Debridement - Excisional Debridement - Excisional N/A Pre-procedure 10:33 10:33 N/A Verification/Time Out Taken: Pain Control: Lidocaine Lidocaine N/A Tissue Debrided: Subcutaneous, Slough Subcutaneous, Slough N/A Level: Skin/Subcutaneous Tissue Skin/Subcutaneous Tissue N/A Debridement Area (sq cm): 0.66 0.25 N/A Instrument: Curette Curette N/A Bleeding: Moderate Moderate N/A Hemostasis Achieved: Pressure Pressure N/A Debridement Treatment Procedure was tolerated well Procedure was tolerated well N/A Response: Post Debridement 1.1x0.6x0.2 0.5x0.5x0.1 N/A Measurements  L x W x D (cm) Post Debridement Volume: 0.104 0.02 N/A (cm) Procedures Performed: Debridement Debridement N/A Treatment Notes Wound #1 (Left, Posterior Lower Leg) Notes prisma conform stretch net Wound #2 (Left, Lateral Malleolus) Notes prisma conform stretch net Electronic Signature(s) Signed: 03/05/2019 5:01:06 PM By: Gretta Cool, BSN, RN, CWS, Kim RN, BSN Previous Signature: 03/05/2019 4:59:33 PM Version By: Gretta Cool, BSN, RN, CWS, Kim RN, BSN Previous Signature: 03/05/2019 4:12:25 PM Version By: Linton Ham MD Entered By: Gretta Cool, BSN, RN, CWS, Kim on 03/05/2019 17:01:06 LINETTA, REGNER ([id number]) -------------------------------------------------------------------------------- [place] Details Patient Name: Deanna White Date of Service: 03/05/2019 10:15 AM Medical Record Number: [id number] Patient Account Number: [id number] Date of Birth/Sex: 1924/12/14 (85 y.o. F) Treating RN: Cornell Barman Primary Care Elson Ulbrich: Deborra Medina Other Clinician: Referring Colbe Viviano: Deborra Medina Treating Damichael Hofman/Extender: Tito Dine in Treatment: 3 Active Inactive Necrotic Tissue Nursing Diagnoses: Impaired tissue integrity related to necrotic/devitalized tissue Knowledge deficit related to management of necrotic/devitalized tissue Goals: Necrotic/devitalized tissue will be minimized in the wound bed Date Initiated: 02/12/2019 Target Resolution Date: 02/19/2019 Goal Status: Active Interventions: Assess patient pain level pre-, during and post procedure and prior to discharge Treatment Activities: Apply topical anesthetic as ordered : 02/12/2019 Excisional debridement : 02/12/2019 Notes: Orientation to the Wound Care Program Nursing Diagnoses: Knowledge deficit related to the wound healing center program Goals: Patient/caregiver will verbalize understanding of the [facility] Date Initiated: 02/12/2019 Target Resolution Date: 02/12/2019 Goal  Status: Active Interventions: Provide education on orientation to the wound center Notes: Pain, Acute or Chronic Nursing Diagnoses: Pain, acute or chronic: actual or potential Goals: Patient will verbalize adequate pain control and receive pain control interventions during procedures as needed GIZELL, DANSER ([id number]) Date Initiated: 02/12/2019 Target Resolution Date: 02/19/2019 Goal Status: Active Interventions: Assess comfort goal upon admission Notes: Wound/Skin Impairment Nursing Diagnoses: Impaired tissue integrity Goals: Patient/caregiver will verbalize understanding of skin care regimen Date Initiated: 02/12/2019 Target Resolution Date: 02/19/2019 Goal Status: Active Ulcer/skin breakdown will have a volume reduction of 30% by week 4 Date Initiated: 02/12/2019 Target Resolution Date: 03/15/2019 Goal Status: Active Interventions: Assess ulceration(s) every visit Notes: Electronic Signature(s) Signed: 03/05/2019 4:59:16 PM By: Gretta Cool, BSN, RN, CWS, Kim RN, BSN Previous Signature: 03/05/2019 4:50:33 PM Version By: Gretta Cool, BSN, RN, CWS, Kim RN, BSN Entered By: Gretta Cool, BSN, RN, CWS, Kim on 03/05/2019 16:59:15 Deanna White ([id number]) -------------------------------------------------------------------------------- Pain Assessment Details Patient Name: Deanna White Date  of Service: 03/05/2019 10:15 AM Medical Record Number: [id number] Patient Account Number: [id number] Date of Birth/Sex: [birth year] (85 y.o. F) Treating RN: Cornell Barman Primary Care Aliciana Ricciardi: Deborra Medina Other Clinician: Referring Teagyn Fishel: Deborra Medina Treating Zayna Toste/Extender: Tito Dine in Treatment: 3 Active Problems Location of Pain Severity and Description of Pain Patient Has Paino No Site Locations Pain Management and Medication Current Pain Management: Electronic Signature(s) Signed: 03/05/2019 4:58:48 PM By: Gretta Cool, BSN, RN, CWS, Kim RN, BSN Previous Signature: 03/05/2019 4:50:33 PM  Version By: Gretta Cool, BSN, RN, CWS, Kim RN, BSN Entered By: Gretta Cool, BSN, RN, CWS, Kim on 03/05/2019 16:58:48 Deanna White ([id number]) -------------------------------------------------------------------------------- Patient/Caregiver Education Details Patient Name: Deanna White Date of Service: 03/05/2019 10:15 AM Medical Record Number: [id number] Patient Account Number: [id number] Date of Birth/Gender: 11-25-1924 (85 y.o. F) Treating RN: Cornell Barman Primary Care Physician: Deborra Medina Other Clinician: Referring Physician: Deborra Medina Treating Physician/Extender: Tito Dine in Treatment: 3 Education Assessment Education Provided To: Patient Education Topics Provided Wound/Skin Impairment: Handouts: Caring for Your Ulcer Methods: Demonstration, Explain/Verbal Responses: State content correctly Electronic Signature(s) Signed: 03/05/2019 5:03:11 PM By: Gretta Cool, BSN, RN, CWS, Kim RN, BSN Previous Signature: 03/05/2019 4:50:33 PM Version By: Gretta Cool, BSN, RN, CWS, Kim RN, BSN Entered By: Gretta Cool, BSN, RN, CWS, Kim on 03/05/2019 17:00:50 Deanna White ([id number]) -------------------------------------------------------------------------------- Wound Assessment Details Patient Name: Deanna White Date of Service: 03/05/2019 10:15 AM Medical Record Number: [id number] Patient Account Number: [id number] Date of Birth/Sex: May 06, 1925 (85 y.o. F) Treating RN: Cornell Barman Primary Care Kip Cropp: Deborra Medina Other Clinician: Referring Zaydan Papesh: Deborra Medina Treating Daron Breeding/Extender: Melburn Hake, HOYT Weeks in Treatment: 3 Wound Status Wound Number: 1 Primary Etiology: Venous Leg Ulcer Wound Location: Left Lower Leg - Posterior Wound Status: Open Wounding Event: Gradually Appeared Comorbid History: Cataracts, Hypertension Date Acquired: 10/02/2018 Weeks Of Treatment: 3 Clustered Wound: No Photos Wound Measurements Length: (cm) 1.1 Width: (cm) 0.6 Depth: (cm) 0.2 Area: (cm)  0.518 Volume: (cm) 0.104 % Reduction in Area: -46.7% % Reduction in Volume: -197.1% Epithelialization: None Tunneling: No Undermining: No Wound Description Full Thickness Without Exposed Support Foul Odo Classification: Structures Slough/F Wound Margin: Flat and Intact Exudate Medium Amount: Exudate Type: Sanguinous Exudate Color: red r After Cleansing: No ibrino Yes Wound Bed Granulation Amount: None Present (0%) Exposed Structure Necrotic Amount: Large (67-100%) Fascia Exposed: No Necrotic Quality: Adherent Slough Fat Layer (Subcutaneous Tissue) Exposed: Yes Tendon Exposed: No Muscle Exposed: No Joint Exposed: No Bone Exposed: No Abdullah, Tiarna S. ([id number]) Treatment Notes Wound #1 (Left, Posterior Lower Leg) Notes prisma conform stretch net Electronic Signature(s) Signed: 03/05/2019 4:50:33 PM By: Gretta Cool, BSN, RN, CWS, Kim RN, BSN Entered By: Gretta Cool, BSN, RN, CWS, Kim on 03/05/2019 10:25:05 Deanna White ([id number]) -------------------------------------------------------------------------------- Wound Assessment Details Patient Name: Deanna White Date of Service: 03/05/2019 10:15 AM Medical Record Number: [id number] Patient Account Number: [id number] Date of Birth/Sex: 09/08/1925 (85 y.o. F) Treating RN: Cornell Barman Primary Care Allisha Harter: Deborra Medina Other Clinician: Referring Zyasia Halbleib: Deborra Medina Treating Unique Searfoss/Extender: Melburn Hake, HOYT Weeks in Treatment: 3 Wound Status Wound Number: 2 Primary Etiology: To be determined Wound Location: Left Malleolus - Lateral Wound Status: Open Wounding Event: Gradually Appeared Comorbid History: Cataracts, Hypertension Date Acquired: 09/30/2018 Weeks Of Treatment: 1 Clustered Wound: No Photos Wound Measurements Length: (cm) 0.5 Width: (cm) 0.5 Depth: (cm) 0.1 Area: (cm) 0.196 Volume: (cm) 0.02 % Reduction in Area: -2350% % Reduction in Volume: -1900% Epithelialization: Large (67-100%) Wound  Description Classification: Unclassifiable Wound Margin:  Indistinct, nonvisible Exudate Amount: None Present Wound Bed Granulation Amount: None Present (0%) Exposed Structure Necrotic Amount: Small (1-33%) Fascia Exposed: No Necrotic Quality: Adherent Slough Fat Layer (Subcutaneous Tissue) Exposed: No Tendon Exposed: No Muscle Exposed: No Joint Exposed: No Bone Exposed: No Treatment Notes Wound #2 (Left, Lateral Malleolus) Notes ANEITA, KIGER ([id number]) prisma conform stretch net Electronic Signature(s) Signed: 03/05/2019 4:50:33 PM By: Gretta Cool, BSN, RN, CWS, Kim RN, BSN Entered By: Gretta Cool, BSN, RN, CWS, Kim on 03/05/2019 10:25:41 Deanna White ([id number]) -------------------------------------------------------------------------------- [place] Details Patient Name: Deanna White Date of Service: 03/05/2019 10:15 AM Medical Record Number: [id number] Patient Account Number: [id number] Date of Birth/Sex: 1925-08-11 (85 y.o. F) Treating RN: Cornell Barman Primary Care Khyri Hinzman: Deborra Medina Other Clinician: Referring Gwendolen Hewlett: Deborra Medina Treating Aftyn Nott/Extender: Tito Dine in Treatment: 3 Vital Signs Time Taken: 10:18 Temperature (F): 98.4 Height (in): 62 Pulse (bpm): 74 Weight (lbs): 118 Respiratory Rate (breaths/min): 16 Body Mass Index (BMI): 21.6 Blood Pressure (mmHg): 162/52 Reference Range: 80 - 120 mg / dl Electronic Signature(s) Signed: 03/05/2019 4:58:56 PM By: Gretta Cool, BSN, RN, CWS, Kim RN, BSN Previous Signature: 03/05/2019 4:50:33 PM Version By: Gretta Cool, BSN, RN, CWS, Kim RN, BSN Entered By: Gretta Cool, BSN, RN, CWS, Kim on 03/05/2019 16:58:56

## 2019-03-06 NOTE — Progress Notes (Signed)
MRN : [id number]  Deanna White is a 85 y.o. (1925/03/17) female who presents with chief complaint of  Chief Complaint  Patient presents with  . New Patient (Initial Visit)    ref Dellia Nims for ABI non healing wound  .  History of Present Illness:   The patient is seen for evaluation of painful lower extremities and diminished pulses associated with ulceration of the left foot and ankle.  The patient notes the ulcer has been present since December and has not been improving.  It is very painful and has had some drainage.  No specific history of trauma noted by the patient.  The patient denies fever or chills.  the patient does have diabetes which has been difficult to control.  Patient notes prior to the ulcer developing the extremities were painful particularly with ambulation or activity and the discomfort is very consistent day today. Typically, the pain occurs at less than one block, progress is as activity continues to the point that the patient must stop walking. Resting including standing still for several minutes allowed resumption of the activity and the ability to walk a similar distance before stopping again. Uneven terrain and inclined shorten the distance. The pain has been progressive over the past several years.   The patient denies rest pain or dangling of an extremity off the side of the bed during the night for relief. No prior interventions or surgeries.  No history of back problems or DJD of the lumbar sacral spine.   The patient denies amaurosis fugax or recent TIA symptoms. There are no recent neurological changes noted. The patient denies history of DVT, PE or superficial thrombophlebitis. The patient denies recent episodes of angina or shortness of breath.   Current Meds  Medication Sig  . amLODipine (NORVASC) 5 MG tablet Take 1 tablet (5 mg total) by mouth daily.  Marland Kitchen aspirin EC 81 MG tablet Take 81 mg by mouth daily.    . cloNIDine (CATAPRES) 0.1 MG tablet Take 1  tablet (0.1 mg total) by mouth 2 (two) times daily.  . dorzolamide (TRUSOPT) 2 % ophthalmic solution PLACE 1 DROP INTO BOTH EYES 2 (TWO) TIMES DAILY.  . hydrochlorothiazide (HYDRODIURIL) 25 MG tablet Take 1 tablet (25 mg total) by mouth daily.  Marland Kitchen ketoconazole (NIZORAL) 2 % cream APPLY TO FEET, TOE WEBS, AND TOENAILS AT BEDTIME  . levothyroxine (SYNTHROID) 75 MCG tablet TAKE 1 TABLET BY MOUTH EVERY DAY  . losartan (COZAAR) 100 MG tablet Take 1 tablet (100 mg total) by mouth daily.  . Multiple Vitamins-Minerals (PRESERVISION AREDS 2) CAPS Take 2 capsules by mouth 2 (two) times daily.  Marland Kitchen torsemide (DEMADEX) 5 MG tablet 1 tablet by mouth daily PRN leg swelling.  . triamcinolone cream (KENALOG) 0.1 % Apply 1 application topically 2 (two) times daily. To left ankle    Past Medical History:  Diagnosis Date  . Allergy   . Carotid artery occlusion   . Colon polyps   . Depression   . Fall from slipping on ice Feb. 2015  . Heart murmur   . Hypertension   . Menopause   . Pneumonia   . Positive TB test   . Sore throat   . Thyroid disease   . Varicose veins     Past Surgical History:  Procedure Laterality Date  . ABDOMINAL HYSTERECTOMY    . APPENDECTOMY    . BILATERAL SALPINGOOPHORECTOMY  1964  . CESAREAN SECTION    . EYE SURGERY Right March  2016   Cataract  . TONSILLECTOMY      Social History Social History   Tobacco Use  . Smoking status: Never Smoker  . Smokeless tobacco: Never Used  Substance Use Topics  . Alcohol use: No  . Drug use: No    Family History Family History  Problem Relation Age of Onset  . Heart disease Mother        After age 77  . Hypertension Mother   . Deep vein thrombosis Mother   . Heart attack Mother   . Kidney disease Father   . Alcohol abuse Brother   . Heart disease Brother   . Cancer Brother        Lung cancer  . Cancer Brother        kidney cancer  . Varicose Veins Sister   . Heart disease Sister        After age 67  . Heart attack  Sister   . Heart disease Sister        After age 75  . Other Other        epilepsy  . Hypertension Other   . Cancer Paternal Grandmother        breast cancer  No family history of bleeding/clotting disorders, porphyria or autoimmune disease   Allergies  Allergen Reactions  . Brimonidine Other (See Comments)    Pt felt as if throat was closing up, pt also had rash  . Adhesive [Tape] Rash    Peels skin     REVIEW OF SYSTEMS (Negative unless checked)  Constitutional: [] Weight loss  [] Fever  [] Chills Cardiac: [] Chest pain   [] Chest pressure   [] Palpitations   [] Shortness of breath when laying flat   [] Shortness of breath with exertion. Vascular:  [x] Pain in legs with walking   [x] Pain in legs at rest  [] History of DVT   [] Phlebitis   [x] Swelling in legs   [] Varicose veins   [x] Non-healing ulcers Pulmonary:   [] Uses home oxygen   [] Productive cough   [] Hemoptysis   [] Wheeze  [] COPD   [] Asthma Neurologic:  [] Dizziness   [] Seizures   [] History of stroke   [] History of TIA  [] Aphasia   [] Vissual changes   [] Weakness or numbness in arm   [] Weakness or numbness in leg Musculoskeletal:   [] Joint swelling   [] Joint pain   [] Low back pain Hematologic:  [] Easy bruising  [] Easy bleeding   [] Hypercoagulable state   [] Anemic Gastrointestinal:  [] Diarrhea   [] Vomiting  [] Gastroesophageal reflux/heartburn   [] Difficulty swallowing. Genitourinary:  [] Chronic kidney disease   [] Difficult urination  [] Frequent urination   [] Blood in urine Skin:  [x] Rashes   [x] Ulcers  Psychological:  [] History of anxiety   []  History of major depression.  Physical Examination  Vitals:   03/06/19 1520  BP: (!) 208/64  Pulse: 74  Resp: 15  Weight: 116 lb (52.6 kg)   Body mass index is 21.92 kg/m. Gen: WD/WN, NAD Head: [place]/AT, No temporalis wasting.  Ear/Nose/Throat: Hearing grossly intact, nares w/o erythema or drainage, poor dentition Eyes: PER, EOMI, sclera nonicteric.  Neck: Supple, no masses.  No bruit or  JVD.  Pulmonary:  Good air movement, clear to auscultation bilaterally, no use of accessory muscles.  Cardiac: RRR, normal S1, S2, no Murmurs. Vascular: 2-3+ edema of the left leg with severe venous changes of the left leg.  Venous ulcer noted in the ankle area on the left, noninfected. Vessel Right Left  Radial Palpable Palpable  PT Not Palpable Not  Palpable  DP Not Palpable Not Palpable  Gastrointestinal: soft, non-distended. No guarding/no peritoneal signs.  Musculoskeletal: M/S 5/5 throughout.  No deformity or atrophy.  Neurologic: CN 2-12 intact. Pain and light touch intact in extremities.  Symmetrical.  Speech is fluent. Motor exam as listed above. Psychiatric: Judgment intact, Mood & affect appropriate for pt's clinical situation. Dermatologic: Venous stasis dermatitis with ulcers present on the left.  No changes consistent with cellulitis. Lymph : No Cervical lymphadenopathy, no lichenification or skin changes of chronic lymphedema.  CBC Lab Results  Component Value Date   WBC 6.9 09/17/2018   HGB 11.2 (L) 09/17/2018   HCT 33.2 (L) 09/17/2018   MCV 95.2 09/17/2018   PLT 235.0 09/17/2018    BMET    Component Value Date/Time   NA 137 12/04/2018   K 4.2 12/04/2018   CL 99 09/23/2018 1056   CO2 25 09/23/2018 1056   GLUCOSE 116 (H) 09/23/2018 1056   BUN 16 12/04/2018   CREATININE 1.0 12/04/2018   CREATININE 0.91 09/23/2018 1056   CALCIUM 8.6 09/23/2018 1056   GFRNONAA 44 (L) 07/27/2018 1842   GFRAA 51 (L) 07/27/2018 1842   CrCl cannot be calculated (Patient's most recent lab result is older than the maximum 21 days allowed.).  COAG No results found for: INR, PROTIME  Radiology Vas [place] Abi With/wo Tbi  Result Date: 03/06/2019 LOWER EXTREMITY DOPPLER STUDY Indications: Ulceration, and Left lateral calf and ankle non-healing wounds              since December.  Comparison Study: none Performing Technologist: Concha [place] RVT  Examination Guidelines: A complete evaluation  includes at minimum, Doppler waveform signals and systolic blood pressure reading at the level of bilateral brachial, anterior tibial, and posterior tibial arteries, when vessel segments are accessible. Bilateral testing is considered an integral part of a complete examination. Photoelectric Plethysmograph (PPG) waveforms and toe systolic pressure readings are included as required and additional duplex testing as needed. Limited examinations for reoccurring indications may be performed as noted.  ABI Findings: +--------+------------------+-----+----------+--------+ Right   Rt Pressure (mmHg)IndexWaveform  Comment  +--------+------------------+-----+----------+--------+ [id number]                                       +--------+------------------+-----+----------+--------+ ATA     170               1.01 monophasic         +--------+------------------+-----+----------+--------+ PTA     153               0.91 biphasic           +--------+------------------+-----+----------+--------+ +----+------------------+-----+----------+--------+ LeftLt Pressure (mmHg)IndexWaveform  Comment  +----+------------------+-----+----------+--------+ ATA 202               1.20 monophasicedema    +----+------------------+-----+----------+--------+ PTA                        monophasicNon-Comp +----+------------------+-----+----------+--------+ +-------+-----------+-----------+------------+------------+ ABI/TBIToday's ABIToday's TBIPrevious ABIPrevious TBI +-------+-----------+-----------+------------+------------+ Right  1.01       .57                                 +-------+-----------+-----------+------------+------------+ Left   1.20       .10                                 +-------+-----------+-----------+------------+------------+  Summary: Right: Resting right ankle-brachial index is within normal range. No evidence of significant right lower extremity arterial disease.  The right toe-brachial index is abnormal. TBI is mildly decreased but shows normal waveforms. Left: Resting left ankle-brachial index indicates noncompressible left lower extremity arteries.The left toe-brachial index is abnormal. ABIs are unreliable. ABI unreliable due to moderate edema. TBI suggests severe disease.  *See table(s) above for measurements and observations.  Electronically signed by Hortencia Pilar MD on 03/06/2019 at 4:46:44 PM.   Final      Assessment/Plan 1. Atherosclerosis of native arteries of the extremities with ulceration ([place])  Recommend:  The patient has evidence of severe atherosclerotic changes of both lower extremities associated with ulceration and tissue loss of the left foot.  This represents a limb threatening ischemia and places the patient at the risk for limb loss.  Patient should undergo angiography of the left lower extremities with the hope for intervention for left limb salvage.  The risks and benefits as well as the alternative therapies was discussed in detail with the patient.  All questions were answered.  Patient agrees to proceed with left leg angiography.  The patient will follow up with me in the office after the procedure.    2. Venous insufficiency of both lower extremities No surgery or intervention at this point in time.    I have had a long discussion with the patient regarding venous insufficiency and why it  causes symptoms. I have discussed with the patient the chronic skin changes that accompany venous insufficiency and the long term sequela such as infection and ulceration.  Patient will begin wearing graduated compression stockings class 1 (20-30 mmHg) or compression wraps on a daily basis a prescription was given. The patient will put the stockings on first thing in the morning and removing them in the evening. The patient is instructed specifically not to sleep in the stockings.    In addition, behavioral modification including several  periods of elevation of the lower extremities during the day will be continued. I have demonstrated that proper elevation is a position with the ankles at heart level.  The patient is instructed to begin routine exercise, especially walking on a daily basis  3. Essential hypertension, benign Continue antihypertensive medications as already ordered, these medications have been reviewed and there are no changes at this time.   4. Paroxysmal atrial fibrillation (HCC) Continue antiarrhythmia medications as already ordered, these medications have been reviewed and there are no changes at this time.  Continue anticoagulation as ordered by Cardiology Service     Hortencia Pilar, MD  03/06/2019 8:34 PM

## 2019-03-07 NOTE — Progress Notes (Signed)
Deanna White, Deanna White ([id number]) Visit Report for 03/05/2019 Debridement Details Patient Name: Deanna White, Deanna White Date of Service: 03/05/2019 10:15 AM Medical Record Number: [id number] Patient Account Number: [id number] Date of Birth/Sex: 1925-04-07 (85 y.o. F) Treating RN: Cornell Barman Primary Care Provider: Deborra Medina Other Clinician: Referring Provider: Deborra Medina Treating Provider/Extender: Tito Dine in Treatment: 3 Debridement Performed for Wound #1 Left,Posterior Lower Leg Assessment: Performed By: Physician Ricard Dillon, MD Debridement Type: Debridement Severity of Tissue Pre Fat layer exposed Debridement: Level of Consciousness (Pre- Awake and Alert procedure): Pre-procedure Verification/Time Yes - 10:33 Out Taken: Start Time: 10:33 Pain Control: Lidocaine Total Area Debrided (L x W): 1.1 (cm) x 0.6 (cm) = 0.66 (cm) Tissue and other material Viable, Slough, Subcutaneous, Slough debrided: Level: Skin/Subcutaneous Tissue Debridement Description: Excisional Instrument: Curette Bleeding: Moderate Hemostasis Achieved: Pressure End Time: 10:35 Response to Treatment: Procedure was tolerated well Level of Consciousness Awake and Alert (Post-procedure): Post Debridement Measurements of Total Wound Length: (cm) 1.1 Width: (cm) 0.6 Depth: (cm) 0.2 Volume: (cm) 0.104 Character of Wound/Ulcer Post Debridement: Stable Severity of Tissue Post Debridement: Fat layer exposed Post Procedure Diagnosis Same as Pre-procedure Electronic Signature(s) Signed: 03/05/2019 4:59:48 PM By: Gretta Cool, BSN, RN, CWS, Kim RN, BSN Signed: 03/06/2019 5:30:42 PM By: Linton Ham MD Previous Signature: 03/05/2019 4:12:25 PM Version By: Linton Ham MD Previous Signature: 03/05/2019 4:50:33 PM Version By: Gretta Cool, BSN, RN, CWS, Kim RN, BSN Entered By: Gretta Cool, BSN, RN, CWS, Kim on 03/05/2019 16:59:48 Deanna White, Deanna White ([id number], KIHN  ([id number]) -------------------------------------------------------------------------------- Debridement Details Patient Name: Deanna White Date of Service: 03/05/2019 10:15 AM Medical Record Number: [id number] Patient Account Number: [id number] Date of Birth/Sex: 1925/02/06 (85 y.o. F) Treating RN: Cornell Barman Primary Care Provider: Deborra Medina Other Clinician: Referring Provider: Deborra Medina Treating Provider/Extender: Tito Dine in Treatment: 3 Debridement Performed for Wound #2 Left,Lateral Malleolus Assessment: Performed By: Physician Ricard Dillon, MD Debridement Type: Debridement Level of Consciousness (Pre- Awake and Alert procedure): Pre-procedure Verification/Time Yes - 10:33 Out Taken: Start Time: 10:33 Pain Control: Lidocaine Total Area Debrided (L x W): 0.5 (cm) x 0.5 (cm) = 0.25 (cm) Tissue and other material Viable, Slough, Subcutaneous, Slough debrided: Level: Skin/Subcutaneous Tissue Debridement Description: Excisional Instrument: Curette Bleeding: Moderate Hemostasis Achieved: Pressure End Time: 10:35 Response to Treatment: Procedure was tolerated well Level of Consciousness Awake and Alert (Post-procedure): Post Debridement Measurements of Total Wound Length: (cm) 0.5 Width: (cm) 0.5 Depth: (cm) 0.1 Volume: (cm) 0.02 Character of Wound/Ulcer Post Debridement: Stable Post Procedure Diagnosis Same as Pre-procedure Electronic Signature(s) Signed: 03/05/2019 5:00:00 PM By: Gretta Cool, BSN, RN, CWS, Kim RN, BSN Signed: 03/06/2019 5:30:42 PM By: Linton Ham MD Previous Signature: 03/05/2019 4:12:25 PM Version By: Linton Ham MD Previous Signature: 03/05/2019 4:50:33 PM Version By: Gretta Cool BSN, RN, CWS, Kim RN, BSN Entered By: Gretta Cool, BSN, RN, CWS, Kim on 03/05/2019 16:59:59 Deanna White, Deanna White ([id number]) -------------------------------------------------------------------------------- HPI Details Patient Name: Deanna White Date of  Service: 03/05/2019 10:15 AM Medical Record Number: [id number] Patient Account Number: [id number] Date of Birth/Sex: 1925-04-30 (85 y.o. F) Treating RN: Cornell Barman Primary Care Provider: Deborra Medina Other Clinician: Referring Provider: Deborra Medina Treating Provider/Extender: Tito Dine in Treatment: 3 History of Present Illness HPI Description: 02/12/2019 Admission This is a very functional 85 year old woman who drove herself to our clinic today. She is concerned about a wound on her left posterior calf. This apparently started 4 to 6 weeks ago at which time she developed increasing  swelling and redness in her lower leg. She saw her primary doctor in April who thought this was a venous insufficiency ulcer with some degree of good venous stasis. She did receive antibiotics as well as triamcinolone cream for the inflammation. The patient complains of a lot of pain both in the wound and also in the lateral part of her ankle which is especially worse at night and relieved by standing or walking. She is a nondiabetic and non-smoker. The patient had a venous Doppler on 10/09/2018 that was negative for a DVT and no reflux noted in the greater saphenous vein. No comment on superficial vein thrombosis. Past medical history includes chronic kidney disease stage III, hypothyroidism, glaucoma, history of venous ulcer, hypertension ABIs in our clinic were not obtainable on the left even with the Doppler. 0.56 on the right 5/20-Patient returns to clinic after 1 week, she was started on Prisma with every other day dressing changes, she has noticed pain with leg up, but denies pain with ambulation. Dopplers are scheduled for June 4 and ABIs are not obtained in clinic 5/27 still using Prisma. Arterial studies are booked for June 4. We are still not using compression due to the complete absence of peripheral pulses in the right foot and a very low ABI. In passing the patient mentioned a very  painful area on her lateral right ankle just posterior to the malleolus. She has a very small nodule. Very tender. I thought this might have a core and I removed some eschar on the top of this but there was nothing to drain. This does not look as though it should be so tender. There was no purulence nothing I thought I could culture. I wondered whether this might be a dermatofibroma however I do not think these are painful. As mentioned I saw no evidence of infection in this use. 6/3; patient is still using Prisma. We do not have a viable wound surface on her original wound area. This continues to require debridement. The small nodule on the right ankle had open this week I am not sure what this is however it is exquisitely painful. Electronic Signature(s) Signed: 03/05/2019 5:01:23 PM By: Gretta Cool, BSN, RN, CWS, Kim RN, BSN Signed: 03/06/2019 5:30:42 PM By: Linton Ham MD Previous Signature: 03/05/2019 4:12:25 PM Version By: Linton Ham MD Entered By: Gretta Cool BSN, RN, CWS, Kim on 03/05/2019 17:01:23 Deanna White, Deanna White ([id number]) -------------------------------------------------------------------------------- Physical Exam Details Patient Name: Deanna White Date of Service: 03/05/2019 10:15 AM Medical Record Number: [id number] Patient Account Number: [id number] Date of Birth/Sex: Sep 09, 1925 (85 y.o. F) Treating RN: Cornell Barman Primary Care Provider: Deborra Medina Other Clinician: Referring Provider: Deborra Medina Treating Provider/Extender: Tito Dine in Treatment: 3 Constitutional Patient is hypertensive.. Pulse regular and within target range for patient.Marland Kitchen Respirations regular, non-labored and within target range.. Temperature is normal and within the target range for the patient.Marland Kitchen appears in no distress. Notes Wound exam; posterior lateral left calf. Still tightly adherent necrotic debris. #3 curette which I removed. Hemostasis with direct pressure. The wound is actually  measuring larger. The small nodule on the lateral left ankle is smaller but open. The exact cause of this is unclear. This was also debrided oIn passing she mentioned that she pulled a tick off her right popliteal fossa. There is no erythema here and no tenderness. Electronic Signature(s) Signed: 03/05/2019 5:01:35 PM By: Gretta Cool, BSN, RN, CWS, Kim RN, BSN Signed: 03/06/2019 5:30:42 PM By: Linton Ham MD Previous Signature: 03/05/2019 4:12:25 PM  Version By: Linton Ham MD Entered By: Gretta Cool, BSN, RN, CWS, Kim on 03/05/2019 17:01:35 Deanna White ([id number]) -------------------------------------------------------------------------------- Physician Orders Details Patient Name: Deanna White Date of Service: 03/05/2019 10:15 AM Medical Record Number: [id number] Patient Account Number: [id number] Date of Birth/Sex: 10/26/1924 (85 y.o. F) Treating RN: Cornell Barman Primary Care Provider: Deborra Medina Other Clinician: Referring Provider: Deborra Medina Treating Provider/Extender: Tito Dine in Treatment: 3 Verbal / Phone Orders: No Diagnosis Coding Wound Cleansing Wound #1 Left,Posterior Lower Leg o Clean wound with Normal Saline. o May Shower, gently pat wound dry prior to applying new dressing. Wound #2 Left,Lateral Malleolus o Clean wound with Normal Saline. o May Shower, gently pat wound dry prior to applying new dressing. Anesthetic (add to Medication List) Wound #1 Left,Posterior Lower Leg o Topical Lidocaine 4% cream applied to wound bed prior to debridement (In Clinic Only). Wound #2 Left,Lateral Malleolus o Topical Lidocaine 4% cream applied to wound bed prior to debridement (In Clinic Only). Primary Wound Dressing Wound #1 Left,Posterior Lower Leg o Silver Collagen Wound #2 Left,Lateral Malleolus o Silver Collagen Secondary Dressing Wound #1 Left,Posterior Lower Leg o Conform/Kerlix - Lightly to secure collagen in place. Wound #2 Left,Lateral  Malleolus o Conform/Kerlix - Lightly to secure collagen in place. Dressing Change Frequency Wound #1 Left,Posterior Lower Leg o Change Dressing Monday, Wednesday, Friday Wound #2 Left,Lateral Malleolus o Change Dressing Monday, Wednesday, Friday Follow-up Appointments Wound #1 Left,Posterior Lower Leg o Return Appointment in 1 week. Wound #2 Left,Lateral Malleolus o Return Appointment in 1 week. Deanna White, Deanna White ([id number]) Edema Control Wound #1 Left,Posterior Lower Leg o Elevate legs to the level of the heart and pump ankles as often as possible Wound #2 Left,Lateral Malleolus o Elevate legs to the level of the heart and pump ankles as often as possible Electronic Signature(s) Signed: 03/05/2019 5:03:11 PM By: Gretta Cool, BSN, RN, CWS, Kim RN, BSN Signed: 03/06/2019 5:30:42 PM By: Linton Ham MD Previous Signature: 03/05/2019 4:50:33 PM Version By: Gretta Cool, BSN, RN, CWS, Kim RN, BSN Entered By: Gretta Cool, BSN, RN, CWS, Kim on 03/05/2019 17:00:23 MARVELOUS, WOOLFORD ([id number]) -------------------------------------------------------------------------------- Problem List Details Patient Name: Deanna White Date of Service: 03/05/2019 10:15 AM Medical Record Number: [id number] Patient Account Number: [id number] Date of Birth/Sex: 1925/03/05 (85 y.o. F) Treating RN: Cornell Barman Primary Care Provider: Deborra Medina Other Clinician: Referring Provider: Deborra Medina Treating Provider/Extender: Tito Dine in Treatment: 3 Active Problems ICD-10 Evaluated Encounter Code Description Active Date Today Diagnosis L97.221 Non-pressure chronic ulcer of left calf limited to breakdown of 02/12/2019 No Yes skin I87.332 Chronic venous hypertension (idiopathic) with ulcer and 02/12/2019 No Yes inflammation of left lower extremity I70.242 Atherosclerosis of native arteries of left leg with ulceration of 02/12/2019 No Yes calf Inactive Problems Resolved Problems Electronic  Signature(s) Signed: 03/05/2019 5:00:58 PM By: Gretta Cool, BSN, RN, CWS, Kim RN, BSN Signed: 03/06/2019 5:30:42 PM By: Linton Ham MD Previous Signature: 03/05/2019 4:12:25 PM Version By: Linton Ham MD Entered By: Gretta Cool, BSN, RN, CWS, Kim on 03/05/2019 17:00:58 Deanna White ([id number]) -------------------------------------------------------------------------------- Progress Note Details Patient Name: Deanna White Date of Service: 03/05/2019 10:15 AM Medical Record Number: [id number] Patient Account Number: [id number] Date of Birth/Sex: 09/17/1925 (85 y.o. F) Treating RN: Cornell Barman Primary Care Provider: Deborra Medina Other Clinician: Referring Provider: Deborra Medina Treating Provider/Extender: Tito Dine in Treatment: 3 Subjective History of Present Illness (HPI) 02/12/2019 Admission This is a very functional 85 year old woman who drove herself to our clinic  today. She is concerned about a wound on her left posterior calf. This apparently started 4 to 6 weeks ago at which time she developed increasing swelling and redness in her lower leg. She saw her primary doctor in April who thought this was a venous insufficiency ulcer with some degree of good venous stasis. She did receive antibiotics as well as triamcinolone cream for the inflammation. The patient complains of a lot of pain both in the wound and also in the lateral part of her ankle which is especially worse at night and relieved by standing or walking. She is a nondiabetic and non-smoker. The patient had a venous Doppler on 10/09/2018 that was negative for a DVT and no reflux noted in the greater saphenous vein. No comment on superficial vein thrombosis. Past medical history includes chronic kidney disease stage III, hypothyroidism, glaucoma, history of venous ulcer, hypertension ABIs in our clinic were not obtainable on the left even with the Doppler. 0.56 on the right 5/20-Patient returns to clinic after 1 week,  she was started on Prisma with every other day dressing changes, she has noticed pain with leg up, but denies pain with ambulation. Dopplers are scheduled for June 4 and ABIs are not obtained in clinic 5/27 still using Prisma. Arterial studies are booked for June 4. We are still not using compression due to the complete absence of peripheral pulses in the right foot and a very low ABI. In passing the patient mentioned a very painful area on her lateral right ankle just posterior to the malleolus. She has a very small nodule. Very tender. I thought this might have a core and I removed some eschar on the top of this but there was nothing to drain. This does not look as though it should be so tender. There was no purulence nothing I thought I could culture. I wondered whether this might be a dermatofibroma however I do not think these are painful. As mentioned I saw no evidence of infection in this use. 6/3; patient is still using Prisma. We do not have a viable wound surface on her original wound area. This continues to require debridement. The small nodule on the right ankle had open this week I am not sure what this is however it is exquisitely painful. Objective Constitutional Patient is hypertensive.. Pulse regular and within target range for patient.Marland Kitchen Respirations regular, non-labored and within target range.. Temperature is normal and within the target range for the patient.Marland Kitchen appears in no distress. Deanna White, Deanna White ([id number]) Vitals Time Taken: 10:18 AM, Height: 62 in, Weight: 118 lbs, BMI: 21.6, Temperature: 98.4 F, Pulse: 74 bpm, Respiratory Rate: 16 breaths/min, Blood Pressure: 162/52 mmHg. General Notes: Wound exam; posterior lateral left calf. Still tightly adherent necrotic debris. #3 curette which I removed. Hemostasis with direct pressure. The wound is actually measuring larger. The small nodule on the lateral left ankle is smaller but open. The exact cause of this is unclear.  This was also debrided In passing she mentioned that she pulled a tick off her right popliteal fossa. There is no erythema here and no tenderness. Integumentary (Hair, Skin) Wound #1 status is Open. Original cause of wound was Gradually Appeared. The wound is located on the Left,Posterior Lower Leg. The wound measures 1.1cm length x 0.6cm width x 0.2cm depth; 0.518cm^2 area and 0.104cm^3 volume. There is Fat Layer (Subcutaneous Tissue) Exposed exposed. There is no tunneling or undermining noted. There is a medium amount of sanguinous drainage noted. The wound margin is  flat and intact. There is no granulation within the wound bed. There is a large (67-100%) amount of necrotic tissue within the wound bed including Adherent Slough. Wound #2 status is Open. Original cause of wound was Gradually Appeared. The wound is located on the Left,Lateral Malleolus. The wound measures 0.5cm length x 0.5cm width x 0.1cm depth; 0.196cm^2 area and 0.02cm^3 volume. There is a none present amount of drainage noted. The wound margin is indistinct and nonvisible. There is no granulation within the wound bed. There is a small (1-33%) amount of necrotic tissue within the wound bed including Adherent Slough. Assessment Active Problems ICD-10 Non-pressure chronic ulcer of left calf limited to breakdown of skin Chronic venous hypertension (idiopathic) with ulcer and inflammation of left lower extremity Atherosclerosis of native arteries of left leg with ulceration of calf Procedures Wound #1 Pre-procedure diagnosis of Wound #1 is a Venous Leg Ulcer located on the Left,Posterior Lower Leg .Severity of Tissue Pre Debridement is: Fat layer exposed. There was a Excisional Skin/Subcutaneous Tissue Debridement with a total area of 0.66 sq cm performed by Ricard Dillon, MD. With the following instrument(s): Curette to remove Viable tissue/material. Material removed includes Subcutaneous Tissue and Slough and after  achieving pain control using Lidocaine. No specimens were taken. A time out was conducted at 10:33, prior to the start of the procedure. A Moderate amount of bleeding was controlled with Pressure. The procedure was tolerated well. Post Debridement Measurements: 1.1cm length x 0.6cm width x 0.2cm depth; 0.104cm^3 volume. Character of Wound/Ulcer Post Debridement is stable. Severity of Tissue Post Debridement is: Fat layer exposed. Post procedure Diagnosis Wound #1: Same as Pre-Procedure Wound #2 Pre-procedure diagnosis of Wound #2 is a To be determined located on the Left,Lateral Malleolus . There was a Excisional Skin/Subcutaneous Tissue Debridement with a total area of 0.25 sq cm performed by Ricard Dillon, MD. With the following instrument(s): Curette to remove Viable tissue/material. Material removed includes Subcutaneous Tissue and Slough and after achieving pain control using Lidocaine. No specimens were taken. A time out was conducted at 10:33, prior Deanna White, Deanna White ([id number]) to the start of the procedure. A Moderate amount of bleeding was controlled with Pressure. The procedure was tolerated well. Post Debridement Measurements: 0.5cm length x 0.5cm width x 0.1cm depth; 0.02cm^3 volume. Character of Wound/Ulcer Post Debridement is stable. Post procedure Diagnosis Wound #2: Same as Pre-Procedure Plan Wound Cleansing: Wound #1 Left,Posterior Lower Leg: Clean wound with Normal Saline. May Shower, gently pat wound dry prior to applying new dressing. Wound #2 Left,Lateral Malleolus: Clean wound with Normal Saline. May Shower, gently pat wound dry prior to applying new dressing. Anesthetic (add to Medication List): Wound #1 Left,Posterior Lower Leg: Topical Lidocaine 4% cream applied to wound bed prior to debridement (In Clinic Only). Wound #2 Left,Lateral Malleolus: Topical Lidocaine 4% cream applied to wound bed prior to debridement (In Clinic Only). Primary Wound  Dressing: Wound #1 Left,Posterior Lower Leg: Silver Collagen Wound #2 Left,Lateral Malleolus: Silver Collagen Secondary Dressing: Wound #1 Left,Posterior Lower Leg: Conform/Kerlix - Lightly to secure collagen in place. Wound #2 Left,Lateral Malleolus: Conform/Kerlix - Lightly to secure collagen in place. Dressing Change Frequency: Wound #1 Left,Posterior Lower Leg: Change Dressing Monday, Wednesday, Friday Wound #2 Left,Lateral Malleolus: Change Dressing Monday, Wednesday, Friday Follow-up Appointments: Wound #1 Left,Posterior Lower Leg: Return Appointment in 1 week. Wound #2 Left,Lateral Malleolus: Return Appointment in 1 week. Edema Control: Wound #1 Left,Posterior Lower Leg: Elevate legs to the level of the heart and pump ankles  as often as possible Wound #2 Left,Lateral Malleolus: Elevate legs to the level of the heart and pump ankles as often as possible 1. We are going to continue with silver collagen now to both wound areas the original wound and the nodule on the lateral left ankle. 2. The patient has her vascular studies tomorrow 3. She has it described tick bite on her posterior right calf I see nothing that would dictate antibiotics here. There is no erythema no tenderness Deanna White, Deanna White ([id number]) Electronic Signature(s) Signed: 03/05/2019 5:02:01 PM By: Gretta Cool, BSN, RN, CWS, Kim RN, BSN Signed: 03/06/2019 5:30:42 PM By: Linton Ham MD Previous Signature: 03/05/2019 4:12:25 PM Version By: Linton Ham MD Entered By: Gretta Cool, BSN, RN, CWS, Kim on 03/05/2019 17:02:00 Deanna White, Deanna White ([id number]) -------------------------------------------------------------------------------- SuperBill Details Patient Name: Deanna White Date of Service: 03/05/2019 Medical Record Number: [id number] Patient Account Number: [id number] Date of Birth/Sex: [birth year] (85 y.o. F) Treating RN: Cornell Barman Primary Care Provider: Deborra Medina Other Clinician: Referring Provider: Deborra Medina Treating Provider/Extender: Tito Dine in Treatment: 3 Diagnosis Coding ICD-10 Codes Code Description [id number] Non-pressure chronic ulcer of left calf limited to breakdown of skin I87.332 Chronic venous hypertension (idiopathic) with ulcer and inflammation of left lower extremity I70.242 Atherosclerosis of native arteries of left leg with ulceration of calf L97.321 Non-pressure chronic ulcer of left ankle limited to breakdown of skin Facility Procedures CPT4 Code: [id number] Description: 09628 - DEB SUBQ TISSUE 20 SQ CM/< ICD-10 Diagnosis Description L97.221 Non-pressure chronic ulcer of left calf limited to breakdown L97.321 Non-pressure chronic ulcer of left ankle limited to breakdow Modifier: of skin n of skin Quantity: 1 Physician Procedures CPT4 Code: [id number] Description: 65465 - WC PHYS SUBQ TISS 20 SQ CM ICD-10 Diagnosis Description L97.221 Non-pressure chronic ulcer of left calf limited to breakdown L97.321 Non-pressure chronic ulcer of left ankle limited to breakdow Modifier: of skin n of skin Quantity: 1 Electronic Signature(s) Signed: 03/05/2019 5:00:36 PM By: Gretta Cool, BSN, RN, CWS, Kim RN, BSN Signed: 03/06/2019 5:30:42 PM By: Linton Ham MD Previous Signature: 03/05/2019 4:12:25 PM Version By: Linton Ham MD Entered By: Gretta Cool, BSN, RN, CWS, Kim on 03/05/2019 17:00:36

## 2019-03-11 ENCOUNTER — Other Ambulatory Visit (INDEPENDENT_AMBULATORY_CARE_PROVIDER_SITE_OTHER): Payer: Self-pay | Admitting: Nurse Practitioner

## 2019-03-11 ENCOUNTER — Telehealth (INDEPENDENT_AMBULATORY_CARE_PROVIDER_SITE_OTHER): Payer: Self-pay

## 2019-03-11 NOTE — Telephone Encounter (Signed)
Patient returned my call and was scheduled for he procedure with Dr. Delana Meyer on 03/14/2019 with a 10:00 am arrival time. Patient will do her Covid testing at the [facility] on Thursday between 10:30-12:30 pm. Patient was mad aware of the zero visitor policy as well.

## 2019-03-11 NOTE — Telephone Encounter (Signed)
I attempted to contact the patient to get her scheduled for a angio with Dr. Delana Meyer. A message was left for a return call.

## 2019-03-12 ENCOUNTER — Other Ambulatory Visit: Payer: Self-pay

## 2019-03-12 ENCOUNTER — Encounter: Payer: Medicare Other | Admitting: Internal Medicine

## 2019-03-12 DIAGNOSIS — I87332 Chronic venous hypertension (idiopathic) with ulcer and inflammation of left lower extremity: Secondary | ICD-10-CM | POA: Diagnosis not present

## 2019-03-12 NOTE — Progress Notes (Signed)
WILLIS, KUIPERS ([id number]) Visit Report for 03/12/2019 HPI Details Patient Name: Deanna White, Deanna White Date of Service: 03/12/2019 1:45 PM Medical Record Number: [id number] Patient Account Number: [id number] Date of Birth/Sex: 12-05-1924 (85 y.o. F) Treating RN: Harold Barban Primary Care Provider: Deborra Medina Other Clinician: Referring Provider: Deborra Medina Treating Provider/Extender: Tito Dine in Treatment: 4 History of Present Illness HPI Description: 02/12/2019 Admission This is a very functional 85 year old woman who drove herself to our clinic today. She is concerned about a wound on her left posterior calf. This apparently started 4 to 6 weeks ago at which time she developed increasing swelling and redness in her lower leg. She saw her primary doctor in April who thought this was a venous insufficiency ulcer with some degree of good venous stasis. She did receive antibiotics as well as triamcinolone cream for the inflammation. The patient complains of a lot of pain both in the wound and also in the lateral part of her ankle which is especially worse at night and relieved by standing or walking. She is a nondiabetic and non-smoker. The patient had a venous Doppler on 10/09/2018 that was negative for a DVT and no reflux noted in the greater saphenous vein. No comment on superficial vein thrombosis. Past medical history includes chronic kidney disease stage III, hypothyroidism, glaucoma, history of venous ulcer, hypertension ABIs in our clinic were not obtainable on the left even with the Doppler. 0.56 on the right 5/20-Patient returns to clinic after 1 week, she was started on Prisma with every other day dressing changes, she has noticed pain with leg up, but denies pain with ambulation. Dopplers are scheduled for June 4 and ABIs are not obtained in clinic 5/27 still using Prisma. Arterial studies are booked for June 4. We are still not using compression due to the  complete absence of peripheral pulses in the right foot and a very low ABI. In passing the patient mentioned a very painful area on her lateral right ankle just posterior to the malleolus. She has a very small nodule. Very tender. I thought this might have a core and I removed some eschar on the top of this but there was nothing to drain. This does not look as though it should be so tender. There was no purulence nothing I thought I could culture. I wondered whether this might be a dermatofibroma however I do not think these are painful. As mentioned I saw no evidence of infection in this use. 6/3; patient is still using Prisma. We do not have a viable wound surface on her original wound area. This continues to require debridement. The small nodule on the right ankle had open this week I am not sure what this is however it is exquisitely painful. 6/10; ARTERIAL studies were done on 6/4. This showed an ABI in the right of 1.01 and a TBI of 0.57. On the left her ABI at the anterior tibial was 1.20. However her TBI was 0.10. Monophasic waveforms mostly bilaterally. She went on to see Dr. Delana Meyer the same day. The patient is undergoing angiography of the left lower extremity with the hope of intervention. He also does felt she had venous insufficiency of both lower extremities. We are using silver collagen to the wounds Electronic Signature(s) Signed: 03/12/2019 4:01:30 PM By: Linton Ham MD Entered By: Linton Ham on 03/12/2019 14:19:42 Deanna White ([id number]) -------------------------------------------------------------------------------- Physical Exam Details Patient Name: Deanna White Date of Service: 03/12/2019 1:45 PM Medical Record Number: [id number] Patient  Account Number: [id number] Date of Birth/Sex: [birth year] (85 y.o. F) Treating RN: Harold Barban Primary Care Provider: Deborra Medina Other Clinician: Referring Provider: Deborra Medina Treating Provider/Extender:  Tito Dine in Treatment: 4 Constitutional Patient is hypertensive.. Pulse regular and within target range for patient.Marland Kitchen Respirations regular, non-labored and within target range.Marland Kitchen appears in no distress. Eyes Conjunctivae clear. No discharge. Respiratory Respiratory effort is easy and symmetric bilaterally. Rate is normal at rest and on room air.. Cardiovascular Pedal pulses absent on the left.. Integumentary (Hair, Skin) No primary skin issues are seen. Psychiatric No evidence of depression, anxiety, or agitation. Calm, cooperative, and communicative. Appropriate interactions and affect.. Notes Wound exam; posterior lateral calf still covered and tightly adherent debris which is not responded to debridement. The small nodule on the left ankle that I debrided last week has a small eschar over the surface I did not disturb this today. Electronic Signature(s) Signed: 03/12/2019 4:01:30 PM By: Linton Ham MD Entered By: Linton Ham on 03/12/2019 14:22:10 Deanna White ([id number]) -------------------------------------------------------------------------------- Physician Orders Details Patient Name: Deanna White Date of Service: 03/12/2019 1:45 PM Medical Record Number: [id number] Patient Account Number: [id number] Date of Birth/Sex: 10/23/1924 (85 y.o. F) Treating RN: Harold Barban Primary Care Provider: Deborra Medina Other Clinician: Referring Provider: Deborra Medina Treating Provider/Extender: Tito Dine in Treatment: 4 Verbal / Phone Orders: No Diagnosis Coding Wound Cleansing Wound #1 Left,Posterior Lower Leg o Clean wound with Normal Saline. o May Shower, gently pat wound dry prior to applying new dressing. Wound #2 Left,Lateral Malleolus o Clean wound with Normal Saline. o May Shower, gently pat wound dry prior to applying new dressing. Anesthetic (add to Medication List) Wound #1 Left,Posterior Lower Leg o Topical  Lidocaine 4% cream applied to wound bed prior to debridement (In Clinic Only). Wound #2 Left,Lateral Malleolus o Topical Lidocaine 4% cream applied to wound bed prior to debridement (In Clinic Only). Primary Wound Dressing Wound #1 Left,Posterior Lower Leg o Silver Collagen Wound #2 Left,Lateral Malleolus o Silver Collagen Secondary Dressing Wound #1 Left,Posterior Lower Leg o Conform/Kerlix - Lightly to secure collagen in place. Wound #2 Left,Lateral Malleolus o Conform/Kerlix - Lightly to secure collagen in place. Dressing Change Frequency Wound #1 Left,Posterior Lower Leg o Change Dressing Monday, Wednesday, Friday Wound #2 Left,Lateral Malleolus o Change Dressing Monday, Wednesday, Friday Follow-up Appointments Wound #1 Left,Posterior Lower Leg o Return Appointment in 1 week. Wound #2 Left,Lateral Malleolus o Return Appointment in 1 week. Deanna White, Deanna White ([id number]) Edema Control Wound #1 Left,Posterior Lower Leg o Elevate legs to the level of the heart and pump ankles as often as possible Wound #2 Left,Lateral Malleolus o Elevate legs to the level of the heart and pump ankles as often as possible Electronic Signature(s) Signed: 03/12/2019 4:01:30 PM By: Linton Ham MD Signed: 03/12/2019 4:18:59 PM By: Harold Barban Entered By: Harold Barban on 03/12/2019 14:15:49 Deanna White ([id number]) -------------------------------------------------------------------------------- Problem List Details Patient Name: Deanna White Date of Service: 03/12/2019 1:45 PM Medical Record Number: [id number] Patient Account Number: [id number] Date of Birth/Sex: 02-10-1925 (85 y.o. F) Treating RN: Harold Barban Primary Care Provider: Deborra Medina Other Clinician: Referring Provider: Deborra Medina Treating Provider/Extender: Tito Dine in Treatment: 4 Active Problems ICD-10 Evaluated Encounter Code Description Active Date Today  Diagnosis L97.221 Non-pressure chronic ulcer of left calf limited to breakdown of 02/12/2019 No Yes skin I87.332 Chronic venous hypertension (idiopathic) with ulcer and 02/12/2019 No Yes inflammation of left lower extremity I70.242 Atherosclerosis of native arteries  of left leg with ulceration of 02/12/2019 No Yes calf Inactive Problems Resolved Problems Electronic Signature(s) Signed: 03/12/2019 4:01:30 PM By: Linton Ham MD Entered By: Linton Ham on 03/12/2019 14:16:52 Deanna White ([id number]) -------------------------------------------------------------------------------- Progress Note Details Patient Name: Deanna White Date of Service: 03/12/2019 1:45 PM Medical Record Number: [id number] Patient Account Number: [id number] Date of Birth/Sex: 03-Nov-1924 (85 y.o. F) Treating RN: Harold Barban Primary Care Provider: Deborra Medina Other Clinician: Referring Provider: Deborra Medina Treating Provider/Extender: Tito Dine in Treatment: 4 Subjective History of Present Illness (HPI) 02/12/2019 Admission This is a very functional 85 year old woman who drove herself to our clinic today. She is concerned about a wound on her left posterior calf. This apparently started 4 to 6 weeks ago at which time she developed increasing swelling and redness in her lower leg. She saw her primary doctor in April who thought this was a venous insufficiency ulcer with some degree of good venous stasis. She did receive antibiotics as well as triamcinolone cream for the inflammation. The patient complains of a lot of pain both in the wound and also in the lateral part of her ankle which is especially worse at night and relieved by standing or walking. She is a nondiabetic and non-smoker. The patient had a venous Doppler on 10/09/2018 that was negative for a DVT and no reflux noted in the greater saphenous vein. No comment on superficial vein thrombosis. Past medical history includes  chronic kidney disease stage III, hypothyroidism, glaucoma, history of venous ulcer, hypertension ABIs in our clinic were not obtainable on the left even with the Doppler. 0.56 on the right 5/20-Patient returns to clinic after 1 week, she was started on Prisma with every other day dressing changes, she has noticed pain with leg up, but denies pain with ambulation. Dopplers are scheduled for June 4 and ABIs are not obtained in clinic 5/27 still using Prisma. Arterial studies are booked for June 4. We are still not using compression due to the complete absence of peripheral pulses in the right foot and a very low ABI. In passing the patient mentioned a very painful area on her lateral right ankle just posterior to the malleolus. She has a very small nodule. Very tender. I thought this might have a core and I removed some eschar on the top of this but there was nothing to drain. This does not look as though it should be so tender. There was no purulence nothing I thought I could culture. I wondered whether this might be a dermatofibroma however I do not think these are painful. As mentioned I saw no evidence of infection in this use. 6/3; patient is still using Prisma. We do not have a viable wound surface on her original wound area. This continues to require debridement. The small nodule on the right ankle had open this week I am not sure what this is however it is exquisitely painful. 6/10; ARTERIAL studies were done on 6/4. This showed an ABI in the right of 1.01 and a TBI of 0.57. On the left her ABI at the anterior tibial was 1.20. However her TBI was 0.10. Monophasic waveforms mostly bilaterally. She went on to see Dr. Delana Meyer the same day. The patient is undergoing angiography of the left lower extremity with the hope of intervention. He also does felt she had venous insufficiency of both lower extremities. We are using silver collagen to the wounds Objective Deanna White, Deanna White.  ([id number]) Constitutional Patient is hypertensive.. Pulse regular  and within target range for patient.Marland Kitchen Respirations regular, non-labored and within target range.Marland Kitchen appears in no distress. Vitals Time Taken: 1:55 PM, Height: 62 in, Weight: 118 lbs, BMI: 21.6, Temperature: 98.0 F, Pulse: 70 bpm, Respiratory Rate: 16 breaths/min, Blood Pressure: 146/52 mmHg. Eyes Conjunctivae clear. No discharge. Respiratory Respiratory effort is easy and symmetric bilaterally. Rate is normal at rest and on room air.. Cardiovascular Pedal pulses absent on the left.Marland Kitchen Psychiatric No evidence of depression, anxiety, or agitation. Calm, cooperative, and communicative. Appropriate interactions and affect.. General Notes: Wound exam; posterior lateral calf still covered and tightly adherent debris which is not responded to debridement. The small nodule on the left ankle that I debrided last week has a small eschar over the surface I did not disturb this today. Integumentary (Hair, Skin) No primary skin issues are seen. Wound #1 status is Open. Original cause of wound was Gradually Appeared. The wound is located on the Left,Posterior Lower Leg. The wound measures 1.2cm length x 0.6cm width x 0.1cm depth; 0.565cm^2 area and 0.057cm^3 volume. There is Fat Layer (Subcutaneous Tissue) Exposed exposed. There is no tunneling or undermining noted. There is a medium amount of serous drainage noted. The wound margin is flat and intact. There is no granulation within the wound bed. There is a large (67-100%) amount of necrotic tissue within the wound bed including Adherent Slough. Wound #2 status is Open. Original cause of wound was Gradually Appeared. The wound is located on the Left,Lateral Malleolus. The wound measures 0.1cm length x 0.1cm width x 0.1cm depth; 0.008cm^2 area and 0.001cm^3 volume. The wound is limited to skin breakdown. There is no tunneling or undermining noted. There is a none present amount of  drainage noted. The wound margin is indistinct and nonvisible. There is no granulation within the wound bed. There is no necrotic tissue within the wound bed. Assessment Active Problems ICD-10 Non-pressure chronic ulcer of left calf limited to breakdown of skin Chronic venous hypertension (idiopathic) with ulcer and inflammation of left lower extremity Atherosclerosis of native arteries of left leg with ulceration of calf Deanna White, Deanna S. ([id number]) Plan Wound Cleansing: Wound #1 Left,Posterior Lower Leg: Clean wound with Normal Saline. May Shower, gently pat wound dry prior to applying new dressing. Wound #2 Left,Lateral Malleolus: Clean wound with Normal Saline. May Shower, gently pat wound dry prior to applying new dressing. Anesthetic (add to Medication List): Wound #1 Left,Posterior Lower Leg: Topical Lidocaine 4% cream applied to wound bed prior to debridement (In Clinic Only). Wound #2 Left,Lateral Malleolus: Topical Lidocaine 4% cream applied to wound bed prior to debridement (In Clinic Only). Primary Wound Dressing: Wound #1 Left,Posterior Lower Leg: Silver Collagen Wound #2 Left,Lateral Malleolus: Silver Collagen Secondary Dressing: Wound #1 Left,Posterior Lower Leg: Conform/Kerlix - Lightly to secure collagen in place. Wound #2 Left,Lateral Malleolus: Conform/Kerlix - Lightly to secure collagen in place. Dressing Change Frequency: Wound #1 Left,Posterior Lower Leg: Change Dressing Monday, Wednesday, Friday Wound #2 Left,Lateral Malleolus: Change Dressing Monday, Wednesday, Friday Follow-up Appointments: Wound #1 Left,Posterior Lower Leg: Return Appointment in 1 week. Wound #2 Left,Lateral Malleolus: Return Appointment in 1 week. Edema Control: Wound #1 Left,Posterior Lower Leg: Elevate legs to the level of the heart and pump ankles as often as possible Wound #2 Left,Lateral Malleolus: Elevate legs to the level of the heart and pump ankles as often as  possible 1. Continue silver collagen to both areas/Kerlix/conform 2. No debridement today and waiting results of the angiography on Friday. 3. Small nodule that we unroofed and  then removed has a covered surface eschar. I did not disturb this today Electronic Signature(s) Signed: 03/12/2019 4:01:30 PM By: Linton Ham MD Entered By: Linton Ham on 03/12/2019 14:25:31 Deanna White ([id number]) -------------------------------------------------------------------------------- Independence Details Patient Name: Deanna White Date of Service: 03/12/2019 Medical Record Number: [id number] Patient Account Number: [id number] Date of Birth/Sex: 1925-02-24 (85 y.o. F) Treating RN: Harold Barban Primary Care Provider: Deborra Medina Other Clinician: Referring Provider: Deborra Medina Treating Provider/Extender: Tito Dine in Treatment: 4 Diagnosis Coding ICD-10 Codes Code Description [phone] Non-pressure chronic ulcer of left calf limited to breakdown of skin I87.332 Chronic venous hypertension (idiopathic) with ulcer and inflammation of left lower extremity I70.242 Atherosclerosis of native arteries of left leg with ulceration of calf Facility Procedures CPT4 Code: [id number] Description: 99213 - WOUND CARE VISIT-LEV 3 EST PT Modifier: Quantity: 1 Physician Procedures CPT4: Description Modifier Quantity Code [id number] 99213 - WC PHYS LEVEL 3 - EST PT 1 ICD-10 Diagnosis Description L97.221 Non-pressure chronic ulcer of left calf limited to breakdown of skin I70.242 Atherosclerosis of native arteries of left leg with  ulceration of calf I87.332 Chronic venous hypertension (idiopathic) with ulcer and inflammation of left lower extremity Electronic Signature(s) Signed: 03/12/2019 4:01:30 PM By: Linton Ham MD Entered By: Linton Ham on 03/12/2019 14:25:52

## 2019-03-12 NOTE — Progress Notes (Signed)
Virtual Visit via Telephone Note   This visit type was conducted due to national recommendations for restrictions regarding the COVID-19 Pandemic (e.g. social distancing) in an effort to limit this patient's exposure and mitigate transmission in our community.  Due to her co-morbid illnesses, this patient is at least at moderate risk for complications without adequate follow up.  This format is felt to be most appropriate for this patient at this time.  The patient did not have access to video technology/had technical difficulties with video requiring transitioning to audio format only (telephone).  All issues noted in this document were discussed and addressed.  No physical exam could be performed with this format.  Please refer to the patient's chart for her  consent to telehealth for [facility].   I connected with  Deanna White on 03/13/19 by a video enabled telemedicine application and verified that I am speaking with the correct person using two identifiers. I discussed the limitations of evaluation and management by telemedicine. The patient expressed understanding and agreed to proceed.   Evaluation Performed:  Follow-up visit  Date:  03/13/2019   ID:  Deanna White, DOB [birth year], MRN [id number]  Patient Location:  [address] [place] [address]   Provider location:   [facility], [place] office  PCP:  Crecencio Mc, MD  Cardiologist:  Patsy Baltimore   Chief Complaint:  Leg swelling,  wound   History of Present Illness:    Deanna White is a 85 y.o. female who presents via audio/video conferencing for a telehealth visit today.   The patient does not symptoms concerning for COVID-19 infection (fever, chills, cough, or new SHORTNESS OF BREATH).   Patient has a past medical history of Svt/atrial fibrillation per Holter monitor read in 2017 by Dr. Louisa Second (results not available for review) Previous carotid studies demonstrated: <1-39% Hypertension  Anemia Hyperlipidemia Who presents atrial tachycardia, SVT  Previous records reviewed No clear evidence of atrial fibrillation based on Holter review 2019 Previous Holter monitor has been requested from 2019 (no atrial fibrillation, only short runs of atrial tachycardia noted) we have also requested 2017 Holter.  Currently not on anticoagulation   On her last clinic visit started on clonidine for blood pressure, losartan continued Amlodipine held for leg swelling   wound on leg dating back to January 2020 Taking torsemide every 3 days, reports she has stopped taking this now that she has been on HCTZ daily  Has a procedure scheduled with Dr. Ronalee Belts tomorrow Appears to be lower extremity angiography presumably for nonhealing wound, been there on left 6 months (one on ankle and leg)  Swelling in left leg, right not much swelling  No near syncope or syncope "may slight touch for just a second, rare"  Blood pressures at home typically running 130 up to 140 Occasionally will spike but in certain situations  Back in early 2019 , syncopal episode while at church Taken to the Er Reports work-up was benign Had follow-up with cardiology [place],  Had echocardiogram stress test and Holter Was told the Holter showed atrial fibrillation Reading the notes indicates she had short runs of SVT but it was labeled atrial fibrillation   worsening leg swelling on the higher dose amlodipine  In the past had fatigue on metoprolol  In the ER 11/2017 For high blood pressure, did not feel well Given hydralazine norvasc increased for discharge  Outpatient records reviewed Holter monitor June 2017 short runs of  SVT consistent with paroxysmal atrial fibrillation Felt at that time to had CHADS VASC of 2 Not treated with anticoagulation  Carotid ultrasound minimal bilateral carotid disease less than 39%  Echocardiogram ejection fraction of 93% grade 2 diastolic dysfunction moderate MR   Stress test July 2017     Prior CV studies:   The following studies were reviewed today:    Past Medical History:  Diagnosis Date  . Allergy   . Carotid artery occlusion   . Colon polyps   . Depression   . Fall from slipping on ice Feb. 2015  . Heart murmur   . Hypertension   . Menopause   . Pneumonia   . Positive TB test   . Sore throat   . Thyroid disease   . Varicose veins    Past Surgical History:  Procedure Laterality Date  . ABDOMINAL HYSTERECTOMY    . APPENDECTOMY    . BILATERAL SALPINGOOPHORECTOMY  1964  . CESAREAN SECTION    . EYE SURGERY Right March 2016   Cataract  . TONSILLECTOMY       No outpatient medications have been marked as taking for the 03/13/19 encounter (Appointment) with Minna Merritts, MD.     Allergies:   Adhesive [tape] and Brimonidine   Social History   Tobacco Use  . Smoking status: Never Smoker  . Smokeless tobacco: Never Used  Substance Use Topics  . Alcohol use: No  . Drug use: No     Current Outpatient Medications on File Prior to Visit  Medication Sig Dispense Refill  . amLODipine (NORVASC) 5 MG tablet Take 1 tablet (5 mg total) by mouth daily. (Patient taking differently: Take 5 mg by mouth daily with lunch. ) 90 tablet 1  . aspirin EC 81 MG tablet Take 81 mg by mouth at bedtime.     . cloNIDine (CATAPRES) 0.1 MG tablet Take 1 tablet (0.1 mg total) by mouth 2 (two) times daily. 60 tablet 11  . dorzolamide (TRUSOPT) 2 % ophthalmic solution Place 1 drop into both eyes 3 (three) times daily.   12  . hydrochlorothiazide (HYDRODIURIL) 25 MG tablet Take 1 tablet (25 mg total) by mouth daily. 90 tablet 0  . levothyroxine (SYNTHROID) 75 MCG tablet TAKE 1 TABLET BY MOUTH EVERY DAY (Patient taking differently: Take 75 mcg by mouth daily. ) 90 tablet 1  . losartan (COZAAR) 100 MG tablet Take 1 tablet (100 mg total) by mouth daily. 90 tablet 1  . Multiple Vitamins-Minerals (PRESERVISION AREDS 2) CAPS Take 1 capsule by mouth 2  (two) times daily.     Marland Kitchen torsemide (DEMADEX) 5 MG tablet 1 tablet by mouth daily PRN leg swelling. (Patient taking differently: Take 5 mg by mouth daily as needed (leg swelling). ) 30 tablet 5  . triamcinolone cream (KENALOG) 0.1 % Apply 1 application topically 2 (two) times daily. To left ankle (Patient not taking: Reported on 03/12/2019) 30 g 0   No current facility-administered medications on file prior to visit.      Family Hx: The patient's family history includes Alcohol abuse in her brother; Cancer in her brother, brother, and paternal grandmother; Deep vein thrombosis in her mother; Heart attack in her mother and sister; Heart disease in her brother, mother, sister, and sister; Hypertension in her mother and another family member; Kidney disease in her father; Other in an other family member; Varicose Veins in her sister.  ROS:   Please see the history of present illness.  Review of Systems  Constitutional: Negative.   Respiratory: Negative.   Cardiovascular: Positive for leg swelling.  Gastrointestinal: Negative.   Musculoskeletal: Negative.   Neurological: Negative.   Psychiatric/Behavioral: Negative.   All other systems reviewed and are negative.     Labs/Other Tests and Data Reviewed:    Recent Labs: 04/15/2018: ALT 14 09/17/2018: Hemoglobin 11.2; Platelets 235.0; TSH 2.80 12/04/2018: BUN 16; Creatinine 1.0; Potassium 4.2; Sodium 137   Recent Lipid Panel Lab Results  Component Value Date/Time   CHOL 178 03/01/2016 11:21 AM   TRIG 73.0 03/01/2016 11:21 AM   HDL 64.20 03/01/2016 11:21 AM   CHOLHDL 3 03/01/2016 11:21 AM   LDLCALC 100 (H) 03/01/2016 11:21 AM   LDLDIRECT 76.0 08/31/2016 11:45 AM    Wt Readings from Last 3 Encounters:  03/06/19 116 lb (52.6 kg)  01/30/19 120 lb 12.8 oz (54.8 kg)  10/18/18 124 lb 6.4 oz (56.4 kg)     Exam:    Vital Signs: Vital signs may also be detailed in the HPI There were no vitals taken for this visit.  Wt Readings from  Last 3 Encounters:  03/06/19 116 lb (52.6 kg)  01/30/19 120 lb 12.8 oz (54.8 kg)  10/18/18 124 lb 6.4 oz (56.4 kg)   Temp Readings from Last 3 Encounters:  01/30/19 97.7 F (36.5 C) (Oral)  10/18/18 97.9 F (36.6 C) (Oral)  09/24/18 98.3 F (36.8 C) (Oral)   BP Readings from Last 3 Encounters:  03/06/19 (!) 208/64  01/30/19 (!) 170/66  10/18/18 (!) 160/66   Pulse Readings from Last 3 Encounters:  03/06/19 74  01/30/19 75  10/18/18 64     Her home numbers: 139/64, left arm 130/60, on the right Pulse 70,resp 16  Well nourished, well developed female in no acute distress. Constitutional:  oriented to person, place, and time. No distress.    ASSESSMENT & PLAN:    Atherosclerosis of artery of extremity with ulceration ([place]) -  Scheduled for lower extremity arterial catheterization tomorrow Non-smoker, no diabetes Cholesterol reasonable  Paroxysmal atrial fibrillation (HCC) -  No clear documentation Not on anticoagulation  Peripheral vascular disease (HCC) - Followed by vascular surgery Testing tomorrow, discussed with her in detail  Essential hypertension, benign - Blood pressure measurements at home typically 540 up to 086 systolic For any worsening swelling could stop the amlodipine, increase clonidine  Anemia, unspecified type -  Managed by primary care  Near syncope - Denies any symptoms in the past year  Stenosis of carotid artery, unspecified laterality - Followed by vascular  Venous insufficiency of both lower extremities -  Reports that she has leg wraps in place For continued swelling consider stopping amlodipine increasing clonidine   COVID-19 Education: The signs and symptoms of COVID-19 were discussed with the patient and how to seek care for testing (follow up with PCP or arrange E-visit).  The importance of social distancing was discussed today.  Patient Risk:   After full review of this patients clinical status, I feel that they are at  least moderate risk at this time.  Time:   Today, I have spent 25 minutes with the patient with telehealth technology discussing the cardiac and medical problems/diagnoses detailed above   10 min spent reviewing the chart prior to patient visit today   Medication Adjustments/Labs and Tests Ordered: Current medicines are reviewed at length with the patient today.  Concerns regarding medicines are outlined above.   Tests Ordered: No tests ordered   Medication Changes: No  changes made   Disposition: Follow-up in 12 months   Signed, Ida Rogue, MD  03/13/2019 10:42 AM    [place] [facility] [id number], [place], [place] [address]

## 2019-03-12 NOTE — Progress Notes (Signed)
ALANII, RAMER ([id number]) Visit Report for 03/12/2019 Arrival Information Details Patient Name: Deanna White, Deanna White Date of Service: 03/12/2019 1:45 PM Medical Record Number: [id number] Patient Account Number: [id number] Date of Birth/Sex: 1925-03-08 (85 y.o. F) Treating RN: Montey Hora Primary Care Laurana Magistro: Deborra Medina Other Clinician: Referring Kabao Leite: Deborra Medina Treating Gregg Holster/Extender: Tito Dine in Treatment: 4 Visit Information History Since Last Visit Added or deleted any medications: No Patient Arrived: Ambulatory Any new allergies or adverse reactions: No Arrival Time: 13:54 Had a fall or experienced change in No Accompanied By: self activities of daily living that may affect Transfer Assistance: None risk of falls: Patient Identification Verified: Yes Signs or symptoms of abuse/neglect since last visito No Secondary Verification Process Yes Hospitalized since last visit: No Completed: Implantable device outside of the clinic excluding No Patient Has Alerts: Yes cellular tissue based products placed in the center Patient Alerts: Patient on Blood since last visit: Thinner Has Dressing in Place as Prescribed: Yes Aspirin 81 mg Pain Present Now: No Electronic Signature(s) Signed: 03/12/2019 3:08:26 PM By: Montey Hora Entered By: Montey Hora on 03/12/2019 13:55:02 Deanna White ([id number]) -------------------------------------------------------------------------------- Clinic Level of Care Assessment Details Patient Name: Deanna White Date of Service: 03/12/2019 1:45 PM Medical Record Number: [id number] Patient Account Number: [id number] Date of Birth/Sex: 04-25-1925 (85 y.o. F) Treating RN: Harold Barban Primary Care Mateo Overbeck: Deborra Medina Other Clinician: Referring Victorine Mcnee: Deborra Medina Treating Tyliyah Mcmeekin/Extender: Tito Dine in Treatment: 4 Clinic Level of Care Assessment Items TOOL 4 Quantity Score []  - Use  when only an EandM is performed on FOLLOW-UP visit 0 ASSESSMENTS - Nursing Assessment / Reassessment X - Reassessment of Co-morbidities (includes updates in patient status) 1 10 X- 1 5 Reassessment of Adherence to Treatment Plan ASSESSMENTS - Wound and Skin Assessment / Reassessment []  - Simple Wound Assessment / Reassessment - one wound 0 X- 2 5 Complex Wound Assessment / Reassessment - multiple wounds []  - 0 Dermatologic / Skin Assessment (not related to wound area) ASSESSMENTS - Focused Assessment []  - Circumferential Edema Measurements - multi extremities 0 []  - 0 Nutritional Assessment / Counseling / Intervention []  - 0 Lower Extremity Assessment (monofilament, tuning fork, pulses) []  - 0 Peripheral Arterial Disease Assessment (using hand held doppler) ASSESSMENTS - Ostomy and/or Continence Assessment and Care []  - Incontinence Assessment and Management 0 []  - 0 Ostomy Care Assessment and Management (repouching, etc.) PROCESS - Coordination of Care X - Simple Patient / Family Education for ongoing care 1 15 []  - 0 Complex (extensive) Patient / Family Education for ongoing care []  - 0 Staff obtains Programmer, systems, Records, Test Results / Process Orders []  - 0 Staff telephones HHA, Nursing Homes / Clarify orders / etc []  - 0 Routine Transfer to another Facility (non-emergent condition) []  - 0 Routine Hospital Admission (non-emergent condition) []  - 0 New Admissions / Biomedical engineer / Ordering NPWT, Apligraf, etc. []  - 0 Emergency Hospital Admission (emergent condition) X- 1 10 Simple Discharge Coordination JARETZI, DROZ. ([id number]) []  - 0 Complex (extensive) Discharge Coordination PROCESS - Special Needs []  - Pediatric / Minor Patient Management 0 []  - 0 Isolation Patient Management []  - 0 Hearing / Language / Visual special needs []  - 0 Assessment of Community assistance (transportation, D/C planning, etc.) []  - 0 Additional assistance / Altered  mentation []  - 0 Support Surface(s) Assessment (bed, cushion, seat, etc.) INTERVENTIONS - Wound Cleansing / Measurement []  - Simple Wound Cleansing - one wound 0 X- 2 5  Complex Wound Cleansing - multiple wounds X- 1 5 Wound Imaging (photographs - any number of wounds) []  - 0 Wound Tracing (instead of photographs) []  - 0 Simple Wound Measurement - one wound X- 2 5 Complex Wound Measurement - multiple wounds INTERVENTIONS - Wound Dressings X - Small Wound Dressing one or multiple wounds 2 10 []  - 0 Medium Wound Dressing one or multiple wounds []  - 0 Large Wound Dressing one or multiple wounds X- 1 5 Application of Medications - topical []  - 0 Application of Medications - injection INTERVENTIONS - Miscellaneous []  - External ear exam 0 []  - 0 Specimen Collection (cultures, biopsies, blood, body fluids, etc.) []  - 0 Specimen(s) / Culture(s) sent or taken to Lab for analysis []  - 0 Patient Transfer (multiple staff / Civil Service fast streamer / Similar devices) []  - 0 Simple Staple / Suture removal (25 or less) []  - 0 Complex Staple / Suture removal (26 or more) []  - 0 Hypo / Hyperglycemic Management (close monitor of Blood Glucose) []  - 0 Ankle / Brachial Index (ABI) - do not check if billed separately X- 1 5 Vital Signs Garlitz, Chassie S. ([id number]) Has the patient been seen at the hospital within the last three years: Yes Total Score: 105 Level Of Care: New/Established - Level 3 Electronic Signature(s) Signed: 03/12/2019 4:18:59 PM By: Harold Barban Entered By: Harold Barban on 03/12/2019 14:14:51 Deanna White ([id number]) -------------------------------------------------------------------------------- Encounter Discharge Information Details Patient Name: Deanna White Date of Service: 03/12/2019 1:45 PM Medical Record Number: [id number] Patient Account Number: [id number] Date of Birth/Sex: 1925/01/27 (85 y.o. F) Treating RN: Harold Barban Primary Care Deliana Avalos: Deborra Medina Other Clinician: Referring Neenah Canter: Deborra Medina Treating Orton Capell/Extender: Tito Dine in Treatment: 4 Encounter Discharge Information Items Discharge Condition: Stable Ambulatory Status: Ambulatory Discharge Destination: Home Transportation: Private Auto Accompanied By: self Schedule Follow-up Appointment: Yes Clinical Summary of Care: Electronic Signature(s) Signed: 03/12/2019 4:18:59 PM By: Harold Barban Entered By: Harold Barban on 03/12/2019 14:23:00 Deanna White ([id number]) -------------------------------------------------------------------------------- Lower Extremity Assessment Details Patient Name: Deanna White Date of Service: 03/12/2019 1:45 PM Medical Record Number: [id number] Patient Account Number: [id number] Date of Birth/Sex: 08/22/1925 (85 y.o. F) Treating RN: Montey Hora Primary Care Ceaser Ebeling: Deborra Medina Other Clinician: Referring Abednego Yeates: Deborra Medina Treating Lyne Khurana/Extender: Ricard Dillon Weeks in Treatment: 4 Edema Assessment Assessed: [Left: No] [Right: No] Edema: [Left: N] [Right: o] Vascular Assessment Pulses: Dorsalis Pedis Palpable: [Left:No] Electronic Signature(s) Signed: 03/12/2019 3:08:26 PM By: Montey Hora Entered By: Montey Hora on 03/12/2019 14:05:54 Deanna White ([id number]) -------------------------------------------------------------------------------- Multi Wound Chart Details Patient Name: Deanna White Date of Service: 03/12/2019 1:45 PM Medical Record Number: [id number] Patient Account Number: [id number] Date of Birth/Sex: 1925/07/03 (85 y.o. F) Treating RN: Harold Barban Primary Care Anjelika Ausburn: Deborra Medina Other Clinician: Referring Jennife Zaucha: Deborra Medina Treating Kenyata Napier/Extender: Tito Dine in Treatment: 4 Vital Signs Height(in): 62 Pulse(bpm): 70 Weight(lbs): 118 Blood Pressure(mmHg): 146/52 Body Mass Index(BMI): 22 Temperature(F):  98.0 Respiratory Rate 16 (breaths/min): Photos: [N/A:N/A] Wound Location: Left Lower Leg - Posterior Left Malleolus - Lateral N/A Wounding Event: Gradually Appeared Gradually Appeared N/A Primary Etiology: Venous Leg Ulcer Venous Leg Ulcer N/A Secondary Etiology: Arterial Insufficiency Ulcer Arterial Insufficiency Ulcer N/A Comorbid History: Cataracts, Hypertension Cataracts, Hypertension N/A Date Acquired: 10/02/2018 09/30/2018 N/A Weeks of Treatment: 4 2 N/A Wound Status: Open Open N/A Measurements L x W x D 1.2x0.6x0.1 0.1x0.1x0.1 N/A (cm) Area (cm) : 0.565 0.008 N/A Volume (cm) : 0.057 0.001 N/A %  Reduction in Area: -60.10% 0.00% N/A % Reduction in Volume: -62.90% 0.00% N/A Classification: Full Thickness Without Unclassifiable N/A Exposed Support Structures Exudate Amount: Medium None Present N/A Exudate Type: Serous N/A N/A Exudate Color: amber N/A N/A Wound Margin: Flat and Intact Indistinct, nonvisible N/A Granulation Amount: None Present (0%) None Present (0%) N/A Necrotic Amount: Large (67-100%) None Present (0%) N/A Exposed Structures: Fat Layer (Subcutaneous Fascia: No N/A Tissue) Exposed: Yes Fat Layer (Subcutaneous Fascia: No Tissue) Exposed: No Tendon: No Tendon: No Muscle: No Muscle: No Joint: No Joint: No Bone: No Fleishman, Mariaisabel S. ([id number]) Bone: No Limited to Skin Breakdown Epithelialization: None Large (67-100%) N/A Treatment Notes Electronic Signature(s) Signed: 03/12/2019 4:01:30 PM By: Linton Ham MD Entered By: Linton Ham on 03/12/2019 14:17:04 Deanna White ([id number]) -------------------------------------------------------------------------------- [place] Details Patient Name: Deanna White Date of Service: 03/12/2019 1:45 PM Medical Record Number: [id number] Patient Account Number: [id number] Date of Birth/Sex: [birth year] (85 y.o. F) Treating RN: Harold Barban Primary Care Hieu Herms: Deborra Medina Other  Clinician: Referring Ary Rudnick: Deborra Medina Treating Kasmira Cacioppo/Extender: Tito Dine in Treatment: 4 Active Inactive Necrotic Tissue Nursing Diagnoses: Impaired tissue integrity related to necrotic/devitalized tissue Knowledge deficit related to management of necrotic/devitalized tissue Goals: Necrotic/devitalized tissue will be minimized in the wound bed Date Initiated: 02/12/2019 Target Resolution Date: 02/19/2019 Goal Status: Active Interventions: Assess patient pain level pre-, during and post procedure and prior to discharge Treatment Activities: Apply topical anesthetic as ordered : 02/12/2019 Excisional debridement : 02/12/2019 Notes: Orientation to the Wound Care Program Nursing Diagnoses: Knowledge deficit related to the wound healing center program Goals: Patient/caregiver will verbalize understanding of the [facility] Program Date Initiated: 02/12/2019 Target Resolution Date: 02/12/2019 Goal Status: Active Interventions: Provide education on orientation to the wound center Notes: Pain, Acute or Chronic Nursing Diagnoses: Pain, acute or chronic: actual or potential Goals: Patient will verbalize adequate pain control and receive pain control interventions during procedures as needed LAKODA, RASKE ([id number]) Date Initiated: 02/12/2019 Target Resolution Date: 02/19/2019 Goal Status: Active Interventions: Assess comfort goal upon admission Notes: Wound/Skin Impairment Nursing Diagnoses: Impaired tissue integrity Goals: Patient/caregiver will verbalize understanding of skin care regimen Date Initiated: 02/12/2019 Target Resolution Date: 02/19/2019 Goal Status: Active Ulcer/skin breakdown will have a volume reduction of 30% by week 4 Date Initiated: 02/12/2019 Target Resolution Date: 03/15/2019 Goal Status: Active Interventions: Assess ulceration(s) every visit Notes: Electronic Signature(s) Signed: 03/12/2019 4:18:59 PM By: Harold Barban Entered By: Harold Barban on 03/12/2019 14:12:35 Deanna White ([id number]) -------------------------------------------------------------------------------- Pain Assessment Details Patient Name: Deanna White Date of Service: 03/12/2019 1:45 PM Medical Record Number: [id number] Patient Account Number: [id number] Date of Birth/Sex: [birth year] (85 y.o. F) Treating RN: Montey Hora Primary Care Cylinda Santoli: Deborra Medina Other Clinician: Referring Dallen Bunte: Deborra Medina Treating Maisey Deandrade/Extender: Tito Dine in Treatment: 4 Active Problems Location of Pain Severity and Description of Pain Patient Has Paino Yes Site Locations Pain Location: Pain in Ulcers With Dressing Change: Yes Duration of the Pain. Constant / Intermittento Intermittent Pain Management and Medication Current Pain Management: Electronic Signature(s) Signed: 03/12/2019 3:08:26 PM By: Montey Hora Entered By: Montey Hora on 03/12/2019 13:55:13 Deanna White ([id number]) -------------------------------------------------------------------------------- Patient/Caregiver Education Details Patient Name: Deanna White Date of Service: 03/12/2019 1:45 PM Medical Record Number: [id number] Patient Account Number: [id number] Date of Birth/Gender: 1925/08/16 (85 y.o. F) Treating RN: Harold Barban Primary Care Physician: Deborra Medina Other Clinician: Referring Physician: Deborra Medina Treating Physician/Extender: Tito Dine in Treatment: 4  Education Assessment Education Provided To: Patient Education Topics Provided Wound/Skin Impairment: Handouts: Caring for Your Ulcer Methods: Demonstration, Explain/Verbal Responses: State content correctly Electronic Signature(s) Signed: 03/12/2019 4:18:59 PM By: Harold Barban Entered By: Harold Barban on 03/12/2019 14:13:41 Deanna White  ([id number]) -------------------------------------------------------------------------------- Wound Assessment Details Patient Name: Deanna White Date of Service: 03/12/2019 1:45 PM Medical Record Number: [id number] Patient Account Number: [id number] Date of Birth/Sex: Jun 05, 1925 (85 y.o. F) Treating RN: Montey Hora Primary Care Wali Reinheimer: Deborra Medina Other Clinician: Referring Glover Capano: Deborra Medina Treating Geovanna Simko/Extender: Tito Dine in Treatment: 4 Wound Status Wound Number: 1 Primary Etiology: Venous Leg Ulcer Wound Location: Left Lower Leg - Posterior Secondary Etiology: Arterial Insufficiency Ulcer Wounding Event: Gradually Appeared Wound Status: Open Date Acquired: 10/02/2018 Comorbid History: Cataracts, Hypertension Weeks Of Treatment: 4 Clustered Wound: No Photos Wound Measurements Length: (cm) 1.2 Width: (cm) 0.6 Depth: (cm) 0.1 Area: (cm) 0.565 Volume: (cm) 0.057 % Reduction in Area: -60.1% % Reduction in Volume: -62.9% Epithelialization: None Tunneling: No Undermining: No Wound Description Full Thickness Without Exposed Support Foul O Classification: Structures Slough Wound Margin: Flat and Intact Exudate Medium Amount: Exudate Type: Serous Exudate Color: amber dor After Cleansing: No /Fibrino Yes Wound Bed Granulation Amount: None Present (0%) Exposed Structure Necrotic Amount: Large (67-100%) Fascia Exposed: No Necrotic Quality: Adherent Slough Fat Layer (Subcutaneous Tissue) Exposed: Yes Tendon Exposed: No Muscle Exposed: No Joint Exposed: No Bone Exposed: No Zatarain, Armina S. ([id number]) Treatment Notes Wound #1 (Left, Posterior Lower Leg) Notes Silver collagen, ABX ointment to lateral ankle, dry gauze, conform, stretchy Electronic Signature(s) Signed: 03/12/2019 3:08:26 PM By: Montey Hora Entered By: Montey Hora on 03/12/2019 14:03:37 Deanna White  ([id number]) -------------------------------------------------------------------------------- Wound Assessment Details Patient Name: Deanna White Date of Service: 03/12/2019 1:45 PM Medical Record Number: [id number] Patient Account Number: [id number] Date of Birth/Sex: 02-Apr-1925 (85 y.o. F) Treating RN: Montey Hora Primary Care Breeley Bischof: Deborra Medina Other Clinician: Referring Katharina Jehle: Deborra Medina Treating Jada Kuhnert/Extender: Tito Dine in Treatment: 4 Wound Status Wound Number: 2 Primary Etiology: Venous Leg Ulcer Wound Location: Left Malleolus - Lateral Secondary Etiology: Arterial Insufficiency Ulcer Wounding Event: Gradually Appeared Wound Status: Open Date Acquired: 09/30/2018 Comorbid History: Cataracts, Hypertension Weeks Of Treatment: 2 Clustered Wound: No Photos Wound Measurements Length: (cm) 0.1 Width: (cm) 0.1 Depth: (cm) 0.1 Area: (cm) 0.008 Volume: (cm) 0.001 % Reduction in Area: 0% % Reduction in Volume: 0% Epithelialization: Large (67-100%) Tunneling: No Undermining: No Wound Description Classification: Unclassifiable Wound Margin: Indistinct, nonvisible Exudate Amount: None Present Wound Bed Granulation Amount: None Present (0%) Exposed Structure Necrotic Amount: None Present (0%) Fascia Exposed: No Fat Layer (Subcutaneous Tissue) Exposed: No Tendon Exposed: No Muscle Exposed: No Joint Exposed: No Bone Exposed: No Limited to Skin Breakdown Treatment Notes Wound #2 (Left, Lateral Malleolus) Demmon, Wyolene S. ([id number]) Notes Silver collagen, ABX ointment to lateral ankle, dry gauze, conform, stretchy Electronic Signature(s) Signed: 03/12/2019 3:08:26 PM By: Montey Hora Entered By: Montey Hora on 03/12/2019 14:04:07 Deanna White ([id number]) -------------------------------------------------------------------------------- Vitals Details Patient Name: Deanna White Date of Service: 03/12/2019 1:45 PM Medical  Record Number: [id number] Patient Account Number: [id number] Date of Birth/Sex: 1925/01/24 (85 y.o. F) Treating RN: Montey Hora Primary Care Jozee Hammer: Deborra Medina Other Clinician: Referring Zeke Aker: Deborra Medina Treating Elex Mainwaring/Extender: Tito Dine in Treatment: 4 Vital Signs Time Taken: 13:55 Temperature (F): 98.0 Height (in): 62 Pulse (bpm): 70 Weight (lbs): 118 Respiratory Rate (breaths/min): 16 Body Mass Index (BMI): 21.6 Blood Pressure (mmHg): 146/52 Reference  Range: 80 - 120 mg / dl Electronic Signature(s) Signed: 03/12/2019 3:08:26 PM By: Montey Hora Entered By: Montey Hora on 03/12/2019 13:58:31

## 2019-03-13 ENCOUNTER — Other Ambulatory Visit
Admission: RE | Admit: 2019-03-13 | Discharge: 2019-03-13 | Disposition: A | Discharge status: Home / Self Care | Payer: Medicare Other | Source: Ambulatory Visit | Attending: Vascular Surgery | Admitting: Vascular Surgery

## 2019-03-13 ENCOUNTER — Telehealth (INDEPENDENT_AMBULATORY_CARE_PROVIDER_SITE_OTHER): Payer: Medicare Other | Admitting: Cardiovascular Disease

## 2019-03-13 DIAGNOSIS — Z01812 Encounter for preprocedural laboratory examination: Secondary | ICD-10-CM | POA: Diagnosis present

## 2019-03-13 DIAGNOSIS — D649 Anemia, unspecified: Secondary | ICD-10-CM | POA: Diagnosis not present

## 2019-03-13 DIAGNOSIS — Z1159 Encounter for screening for other viral diseases: Secondary | ICD-10-CM | POA: Insufficient documentation

## 2019-03-13 DIAGNOSIS — I70299 Other atherosclerosis of native arteries of extremities, unspecified extremity: Secondary | ICD-10-CM | POA: Diagnosis not present

## 2019-03-13 DIAGNOSIS — I10 Essential (primary) hypertension: Secondary | ICD-10-CM

## 2019-03-13 DIAGNOSIS — I480 Paroxysmal atrial fibrillation: Secondary | ICD-10-CM

## 2019-03-13 DIAGNOSIS — I739 Peripheral vascular disease, unspecified: Secondary | ICD-10-CM

## 2019-03-13 DIAGNOSIS — R55 Syncope and collapse: Secondary | ICD-10-CM | POA: Diagnosis not present

## 2019-03-13 DIAGNOSIS — I872 Venous insufficiency (chronic) (peripheral): Secondary | ICD-10-CM

## 2019-03-13 DIAGNOSIS — L97909 Non-pressure chronic ulcer of unspecified part of unspecified lower leg with unspecified severity: Secondary | ICD-10-CM

## 2019-03-13 DIAGNOSIS — I6529 Occlusion and stenosis of unspecified carotid artery: Secondary | ICD-10-CM

## 2019-03-13 LAB — CREATININE, SERUM
Creatinine, Ser: 0.9 mg/dL (ref 0.44–1.00)
GFR calc Af Amer: >60 mL/min (ref >60)
GFR calc non Af Amer: 56 mL/min — ABNORMAL LOW (ref >60)

## 2019-03-13 LAB — SARS CORONAVIRUS 2 BY RT PCR (HOSPITAL ORDER, PERFORMED IN ~~LOC~~ HOSPITAL LAB): SARS Coronavirus 2: NEGATIVE

## 2019-03-13 LAB — BUN: BUN: 18 mg/dL (ref 8–23)

## 2019-03-13 MED ORDER — CEFAZOLIN SODIUM-DEXTROSE 2-4 GM/100ML-% IV SOLN: 2.0000 g | Freq: Once | INTRAVENOUS | Status: DC

## 2019-03-13 NOTE — Patient Instructions (Signed)

## 2019-03-14 ENCOUNTER — Encounter: Payer: Self-pay | Admitting: *Deleted

## 2019-03-14 ENCOUNTER — Other Ambulatory Visit: Payer: Self-pay

## 2019-03-14 ENCOUNTER — Encounter
Admission: RE | Disposition: A | Discharge status: Home / Self Care | Payer: Self-pay | Source: Home / Self Care | Attending: Vascular Surgery

## 2019-03-14 ENCOUNTER — Ambulatory Visit
Admission: RE | Admit: 2019-03-14 | Discharge: 2019-03-14 | Disposition: A | Discharge status: Home / Self Care | Payer: Medicare Other | Attending: Vascular Surgery | Admitting: Vascular Surgery

## 2019-03-14 DIAGNOSIS — L97529 Non-pressure chronic ulcer of other part of left foot with unspecified severity: Secondary | ICD-10-CM | POA: Insufficient documentation

## 2019-03-14 DIAGNOSIS — Z79899 Other long term (current) drug therapy: Secondary | ICD-10-CM | POA: Diagnosis not present

## 2019-03-14 DIAGNOSIS — L97909 Non-pressure chronic ulcer of unspecified part of unspecified lower leg with unspecified severity: Secondary | ICD-10-CM

## 2019-03-14 DIAGNOSIS — I70203 Unspecified atherosclerosis of native arteries of extremities, bilateral legs: Secondary | ICD-10-CM | POA: Diagnosis present

## 2019-03-14 DIAGNOSIS — Z7982 Long term (current) use of aspirin: Secondary | ICD-10-CM | POA: Diagnosis not present

## 2019-03-14 DIAGNOSIS — I480 Paroxysmal atrial fibrillation: Secondary | ICD-10-CM | POA: Diagnosis not present

## 2019-03-14 DIAGNOSIS — I70262 Atherosclerosis of native arteries of extremities with gangrene, left leg: Secondary | ICD-10-CM | POA: Diagnosis not present

## 2019-03-14 DIAGNOSIS — F329 Major depressive disorder, single episode, unspecified: Secondary | ICD-10-CM | POA: Insufficient documentation

## 2019-03-14 DIAGNOSIS — E079 Disorder of thyroid, unspecified: Secondary | ICD-10-CM | POA: Insufficient documentation

## 2019-03-14 DIAGNOSIS — Z7901 Long term (current) use of anticoagulants: Secondary | ICD-10-CM | POA: Diagnosis not present

## 2019-03-14 DIAGNOSIS — I10 Essential (primary) hypertension: Secondary | ICD-10-CM | POA: Insufficient documentation

## 2019-03-14 DIAGNOSIS — L97929 Non-pressure chronic ulcer of unspecified part of left lower leg with unspecified severity: Secondary | ICD-10-CM

## 2019-03-14 DIAGNOSIS — Z79890 Hormone replacement therapy (postmenopausal): Secondary | ICD-10-CM | POA: Diagnosis not present

## 2019-03-14 DIAGNOSIS — I70299 Other atherosclerosis of native arteries of extremities, unspecified extremity: Secondary | ICD-10-CM

## 2019-03-14 HISTORY — PX: LOWER EXTREMITY ANGIOGRAPHY: CATH118251

## 2019-03-14 SURGERY — LOWER EXTREMITY ANGIOGRAPHY
Anesthesia: Moderate Sedation | Site: Leg Lower | Laterality: Left

## 2019-03-14 MED ORDER — SODIUM CHLORIDE 0.9 % IV SOLN
INTRAVENOUS | Status: DC
  Administered 2019-03-14: 11:00:00 via INTRAVENOUS

## 2019-03-14 MED ORDER — MIDAZOLAM HCL 5 MG/5ML IJ SOLN
INTRAMUSCULAR | Status: AC
  Filled 2019-03-14: qty 5

## 2019-03-14 MED ORDER — CEFAZOLIN SODIUM-DEXTROSE 2-4 GM/100ML-% IV SOLN
INTRAVENOUS | Status: AC
  Administered 2019-03-14: 11:00:00
  Filled 2019-03-14: qty 100

## 2019-03-14 MED ORDER — HEPARIN SODIUM (PORCINE) 1000 UNIT/ML IJ SOLN
INTRAMUSCULAR | Status: DC | PRN
  Administered 2019-03-14: 4000 [IU] via INTRAVENOUS

## 2019-03-14 MED ORDER — DIPHENHYDRAMINE HCL 50 MG/ML IJ SOLN: 50.0000 mg | Freq: Once | INTRAMUSCULAR | Status: DC | PRN

## 2019-03-14 MED ORDER — IODIXANOL 320 MG/ML IV SOLN
INTRAVENOUS | Status: DC | PRN
  Administered 2019-03-14: 115 mL via INTRAVENOUS

## 2019-03-14 MED ORDER — SODIUM CHLORIDE 0.9 % IV SOLN: 250.0000 mL | INTRAVENOUS | Status: DC | PRN

## 2019-03-14 MED ORDER — MORPHINE SULFATE (PF) 4 MG/ML IV SOLN: 2.0000 mg | INTRAVENOUS | Status: DC | PRN

## 2019-03-14 MED ORDER — FENTANYL CITRATE (PF) 100 MCG/2ML IJ SOLN
INTRAMUSCULAR | Status: AC
  Filled 2019-03-14: qty 2

## 2019-03-14 MED ORDER — SODIUM CHLORIDE 0.9% FLUSH: 3.0000 mL | INTRAVENOUS | Status: DC | PRN

## 2019-03-14 MED ORDER — METHYLPREDNISOLONE SODIUM SUCC 125 MG IJ SOLR: 125.0000 mg | Freq: Once | INTRAMUSCULAR | Status: DC | PRN

## 2019-03-14 MED ORDER — CLOPIDOGREL BISULFATE 75 MG PO TABS: 75.0000 mg | ORAL_TABLET | Freq: Every day | ORAL | 4 refills | Status: DC

## 2019-03-14 MED ORDER — ONDANSETRON HCL 4 MG/2ML IJ SOLN: 4.0000 mg | Freq: Four times a day (QID) | INTRAMUSCULAR | Status: DC | PRN

## 2019-03-14 MED ORDER — FAMOTIDINE 20 MG PO TABS: 40.0000 mg | ORAL_TABLET | Freq: Once | ORAL | Status: DC | PRN

## 2019-03-14 MED ORDER — SODIUM CHLORIDE 0.9% FLUSH: 3.0000 mL | Freq: Two times a day (BID) | INTRAVENOUS | Status: DC

## 2019-03-14 MED ORDER — CLOPIDOGREL BISULFATE 75 MG PO TABS
ORAL_TABLET | ORAL | Status: AC
  Filled 2019-03-14: qty 2

## 2019-03-14 MED ORDER — NITROGLYCERIN 1 MG/10 ML FOR IR/CATH LAB
INTRA_ARTERIAL | Status: DC | PRN
  Administered 2019-03-14: 200 ug via INTRA_ARTERIAL

## 2019-03-14 MED ORDER — SODIUM CHLORIDE 0.9 % IV SOLN: INTRAVENOUS | Status: DC

## 2019-03-14 MED ORDER — MIDAZOLAM HCL 2 MG/2ML IJ SOLN
INTRAMUSCULAR | Status: DC | PRN
  Administered 2019-03-14: 1 mg via INTRAVENOUS
  Administered 2019-03-14 (×3): 0.5 mg via INTRAVENOUS

## 2019-03-14 MED ORDER — OXYCODONE HCL 5 MG PO TABS: 5.0000 mg | ORAL_TABLET | ORAL | Status: DC | PRN

## 2019-03-14 MED ORDER — ACETAMINOPHEN 325 MG PO TABS: 650.0000 mg | ORAL_TABLET | ORAL | Status: DC | PRN

## 2019-03-14 MED ORDER — HYDRALAZINE HCL 20 MG/ML IJ SOLN: 5.0000 mg | INTRAMUSCULAR | Status: DC | PRN

## 2019-03-14 MED ORDER — FENTANYL CITRATE (PF) 100 MCG/2ML IJ SOLN
INTRAMUSCULAR | Status: DC | PRN
  Administered 2019-03-14 (×2): 25 ug
  Administered 2019-03-14: 50 ug via INTRAVENOUS
  Administered 2019-03-14: 50 ug

## 2019-03-14 MED ORDER — HEPARIN SODIUM (PORCINE) 1000 UNIT/ML IJ SOLN
INTRAMUSCULAR | Status: AC
  Filled 2019-03-14: qty 1

## 2019-03-14 MED ORDER — HYDROMORPHONE HCL 1 MG/ML IJ SOLN: 1.0000 mg | Freq: Once | INTRAMUSCULAR | Status: DC | PRN

## 2019-03-14 MED ORDER — CLOPIDOGREL BISULFATE 75 MG PO TABS
150.0000 mg | ORAL_TABLET | ORAL | Status: AC
  Administered 2019-03-14: 150 mg via ORAL

## 2019-03-14 MED ORDER — LABETALOL HCL 5 MG/ML IV SOLN: 10.0000 mg | INTRAVENOUS | Status: DC | PRN

## 2019-03-14 MED ORDER — MIDAZOLAM HCL 2 MG/ML PO SYRP: 8.0000 mg | ORAL_SOLUTION | Freq: Once | ORAL | Status: DC | PRN

## 2019-03-14 SURGICAL SUPPLY — 26 items
BALLN LUTONIX DCB 5X60X130 (BALLOONS) ×3
BALLN ULTRASCORE 4X40X130 (BALLOONS) ×3
BALLN ULTRVRSE 2X300X150 (BALLOONS) ×3
BALLN ULTRVRSE 2X300X150 OTW (BALLOONS) ×1
BALLOON LUTONIX DCB 5X60X130 (BALLOONS) IMPLANT
BALLOON ULTRASCORE 4X40X130 (BALLOONS) IMPLANT
BALLOON ULTRVRSE 2X300X150 OTW (BALLOONS) IMPLANT
CATH BEACON 5 .038 100 VERT TP (CATHETERS) ×3 IMPLANT
CATH CROSSER S6 154CM (CATHETERS) ×2 IMPLANT
CATH PIG 70CM (CATHETERS) ×2 IMPLANT
CATH SEEKER .035X135CM (CATHETERS) ×2 IMPLANT
CATH USHER TPER 130CM (CATHETERS) ×2 IMPLANT
DEVICE PRESTO INFLATION (MISCELLANEOUS) ×2 IMPLANT
DEVICE STARCLOSE SE CLOSURE (Vascular Products) ×2 IMPLANT
DEVICE TORQUE .025-.038 (MISCELLANEOUS) ×2 IMPLANT
KIT FLOWMATE PROCEDURAL (KITS) ×2 IMPLANT
NDL ENTRY 21GA 7CM ECHOTIP (NEEDLE) IMPLANT
NEEDLE ENTRY 21GA 7CM ECHOTIP (NEEDLE) ×3 IMPLANT
PACK ANGIOGRAPHY (CUSTOM PROCEDURE TRAY) ×3 IMPLANT
SET INTRO CAPELLA COAXIAL (SET/KITS/TRAYS/PACK) ×2 IMPLANT
SHEATH BRITE TIP 5FRX11 (SHEATH) ×2 IMPLANT
SHEATH RAABE 6FR (SHEATH) ×2 IMPLANT
TUBING CONTRAST HIGH PRESS 72 (TUBING) ×2 IMPLANT
WIRE AQUATRACK .035X260CM (WIRE) ×2 IMPLANT
WIRE G V18X300CM (WIRE) ×2 IMPLANT
WIRE J 3MM .035X145CM (WIRE) ×2 IMPLANT

## 2019-03-14 NOTE — Op Note (Signed)
[facility] Percutaneous Study/Intervention Procedural Note   Date of Surgery: 03/14/2019  Surgeon:  Gregory G. Schnier, MD.  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 Introduction catheter into left lower extremity 3rd order catheter placement  2. Contrast injection left lower extremity for distal runoff   3. Crosser atherectomy of the left anterior artery 4. Percutaneous transluminal angioplasty left anterior artery with a 2 mm x 30 cm Ultraverse balloon             5.  Percutaneous transluminal angioplasty of the SFA and popliteal arteries with a 5 mm x 60 mm Lutonix balloon                      6.   Star close closure right common femoral arteriotomy                Anesthesia: Conscious sedation was administered under my direct supervision by the interventional radiology RN. IV Versed plus fentanyl were utilized. Continuous ECG, pulse oximetry and blood pressure was monitored throughout the entire procedure. Conscious sedation was for a total of 95 minutes.  Sheath: 6 French Raby sheath right common femoral retrograde  Contrast: 115 cc  Fluoroscopy Time: 13.2 minutes  Indications: Linnette S Litke presents with atherosclerotic occlusive disease bilateral lower extremities. The patient has developed ulceration and gangrene of the soft tissues of the left lower extremity. This places the patient at high risk for limb loss and amputation. The risks and benefits are reviewed all questions answered patient agrees to proceed.  Procedure: Daylen S Budnick is a 85 y.o. y.o. female who was identified and appropriate procedural time out was performed. The patient was then placed supine on the table and prepped and draped in the usual sterile fashion.    Ultrasound was placed in the sterile sleeve and the right groin was evaluated the right common femoral artery was echolucent and pulsatile indicating patency.  Image was recorded for the permanent record and under real-time visualization a microneedle was inserted into the common femoral artery microwire followed by a micro-sheath.  A J-wire was then advanced through the micro-sheath and a  5 French sheath was then inserted over a J-wire. J-wire was then advanced and a 5 French pigtail catheter was positioned at the level of T12. AP projection of the aorta was then obtained. Pigtail catheter was repositioned to above the bifurcation and a RAO view of the pelvis was obtained.  Subsequently a pigtail catheter with the stiff angle Glidewire was used to cross the aortic bifurcation the catheter wire were advanced down into the left distal external iliac artery. Oblique view of the femoral bifurcation was then obtained and subsequently the wire was reintroduced and the pigtail catheter negotiated into the SFA representing third order catheter placement. Distal runoff was then performed.  Diagnostic interpretation: The abdominal aorta is opacified with a bolus injection contrast.  Only one nephrogram is noted on the right and is quite large.  It is difficult given her breathing and the image quality to determine whether there is any hemodynamically significant renal artery disease on the right.  It does not appear to be a left renal artery.  The infrarenal abdominal aorta is free of hemodynamically significant stenosis.  The bilateral common internal and external iliac arteries are patent and free of hemodynamically significant stenosis.  The left common   femoral profunda femoris and proximal SFA are widely patent there is minimal atherosclerotic changes.  At [facility] canal there is a string sign within the distal SFA and proximal popliteal.  Distally the popliteal is patent although there is diffuse disease.  At  the level of the trifurcation the origin of the anterior tibial is noted but the artery occludes shortly after this and remains occluded down to the dorsalis pedis.  The tibioperoneal trunk is patent and the peroneal is the dominant runoff to the foot with a large collateral filling the distal posterior tibial.  Proximal posterior tibial is identified but occludes shortly after its origin and remains occluded down to the level of the medial malleolar line.  Based on these images I elected to move forward with intervention.  5000 units of heparin was then given and allowed to circulate and a 6 [place] Raby sheath was advanced up and over the bifurcation and positioned in the common femoral artery.  Wire and catheter were then used to negotiate the SFA stenosis.  A 4 mm x 40 mm ultra score balloon was then advanced across the lesion at [place] canal angioplasty was performed to 10 atm for 1 minute.  Follow-up imaging demonstrated marked improvement and I elected to move forward with a drug-eluting balloon.  A 5 mm x 60 mm Lutonix drug-eluting balloon was advanced across the lesion inflated to 8 atm for 2 minutes.  Follow-up imaging demonstrated wide patency with less than 5% residual stenosis.  Attention was now turned to the anterior tibial lesion.  The wire and catheter were negotiated into the anterior tibial wire was then exchanged for a V 18 wire and the Usher catheter was advanced into the anterior tibial.  The S6 Crosser catheter was then prepped on the field and advanced into the cul-de-sac of the anterior tibial under magnified imaging. Using the Crosser catheter the occlusion of the anterior tibial was negotiated.  Injection of contrast confirmed intraluminal positioning in the dorsalis pedis.  Seeker catheter and V 18 wire were then advanced down into the distal tibial.    A 2 mm x 30 cm Ultraverse balloon was used to angioplasty the anterior tibial artery. Inflation was to 10 atmospheres for 2  minutes. Follow-up imaging demonstrated patency down to the pedal arch.  There was less than 15% residual stenosis noted throughout the anterior tibial  After review of these images the sheath is pulled into the right external iliac oblique of the common femoral is obtained and a Star close device deployed. There no immediate Complications.  Findings:  The abdominal aorta is opacified with a bolus injection contrast.  Only one nephrogram is noted on the right and is quite large.  It is difficult given her breathing and the image quality to determine whether there is any hemodynamically significant renal artery disease on the right.  It does not appear to be a left renal artery.  The infrarenal abdominal aorta is free of hemodynamically significant stenosis.  The bilateral common internal and external iliac arteries are patent and free of hemodynamically significant stenosis.  The left common femoral profunda femoris and proximal SFA are widely patent there is minimal atherosclerotic changes.  At [facility] canal there is a string sign within the distal SFA and proximal popliteal.  Distally the popliteal is patent although there is diffuse disease.  At the level of the trifurcation the origin of the anterior tibial is noted but the artery occludes shortly after this and remains occluded down to  the dorsalis pedis.  The tibioperoneal trunk is patent and the peroneal is the dominant runoff to the foot with a large collateral filling the distal posterior tibial.  Proximal posterior tibial is identified but occludes shortly after its origin and remains occluded down to the level of the medial malleolar line.  Following angioplasty of the SFA and above-knee popliteal there is now wide patency with less than 5% residual stenosis.  Following crosser atherectomy and angioplastyof the anterior tibial there is now less than 15% residual stenosis with filling of the dorsalis  pedis.  Disposition: Patient was taken to the recovery room in stable condition having tolerated the procedure well.  Gregory Schnier 03/14/2019,12:42 PM 

## 2019-03-14 NOTE — Discharge Instructions (Signed)

## 2019-03-14 NOTE — Progress Notes (Signed)
Telephone education and personal education at car, post procedure wound care and medication (Plavix) education with daughter and patient

## 2019-03-14 NOTE — H&P (Signed)
[facility] History & Physical Update  The patient was interviewed and re-examined.  The patient's previous History and Physical has been reviewed and is unchanged.  There is no change in the plan of care. We plan to proceed with the scheduled procedure.  Hortencia Pilar, MD  03/14/2019, 11:23 AM

## 2019-03-19 ENCOUNTER — Other Ambulatory Visit: Payer: Self-pay

## 2019-03-19 ENCOUNTER — Encounter: Payer: Medicare Other | Admitting: Internal Medicine

## 2019-03-19 DIAGNOSIS — I87332 Chronic venous hypertension (idiopathic) with ulcer and inflammation of left lower extremity: Secondary | ICD-10-CM | POA: Diagnosis not present

## 2019-03-20 NOTE — Progress Notes (Signed)
EMRYN, FLANERY ([id number]) Visit Report for 03/19/2019 Debridement Details Patient Name: Deanna White, Deanna White Date of Service: 03/19/2019 1:00 PM Medical Record Number: [id number] Patient Account Number: [id number] Date of Birth/Sex: 04/22/1925 (85 y.o. F) Treating RN: Cornell Barman Primary Care Provider: Deborra Medina Other Clinician: Referring Provider: Deborra Medina Treating Provider/Extender: Tito Dine in Treatment: 5 Debridement Performed for Wound #1 Left,Posterior Lower Leg Assessment: Performed By: Physician Ricard Dillon, MD Debridement Type: Debridement Severity of Tissue Pre Fat layer exposed Debridement: Level of Consciousness (Pre- Awake and Alert procedure): Pre-procedure Verification/Time Yes - 13:42 Out Taken: Start Time: 13:42 Pain Control: Lidocaine Total Area Debrided (L x W): 0.9 (cm) x 0.7 (cm) = 0.63 (cm) Tissue and other material Viable, Non-Viable, Slough, Subcutaneous, Slough debrided: Level: Skin/Subcutaneous Tissue Debridement Description: Excisional Instrument: Curette Bleeding: Moderate Hemostasis Achieved: Pressure End Time: 13:45 Response to Treatment: Procedure was tolerated well Level of Consciousness Awake and Alert (Post-procedure): Post Debridement Measurements of Total Wound Length: (cm) 0.9 Width: (cm) 0.7 Depth: (cm) 0.2 Volume: (cm) 0.099 Character of Wound/Ulcer Post Debridement: Stable Severity of Tissue Post Debridement: Fat layer exposed Post Procedure Diagnosis Same as Pre-procedure Electronic Signature(s) Signed: 03/19/2019 6:03:55 PM By: Linton Ham MD Signed: 03/20/2019 4:58:07 PM By: Gretta Cool, BSN, RN, CWS, Kim RN, BSN Entered By: Linton Ham on 03/19/2019 14:01:36 Deanna White ([id number]) -------------------------------------------------------------------------------- HPI Details Patient Name: Deanna White Date of Service: 03/19/2019 1:00 PM Medical Record Number: [id number] Patient  Account Number: [id number] Date of Birth/Sex: [birth year] (85 y.o. F) Treating RN: Cornell Barman Primary Care Provider: Deborra Medina Other Clinician: Referring Provider: Deborra Medina Treating Provider/Extender: Tito Dine in Treatment: 5 History of Present Illness HPI Description: 02/12/2019 Admission This is a very functional 85 year old woman who drove herself to our clinic today. She is concerned about a wound on her left posterior calf. This apparently started 4 to 6 weeks ago at which time she developed increasing swelling and redness in her lower leg. She saw her primary doctor in April who thought this was a venous insufficiency ulcer with some degree of good venous stasis. She did receive antibiotics as well as triamcinolone cream for the inflammation. The patient complains of a lot of pain both in the wound and also in the lateral part of her ankle which is especially worse at night and relieved by standing or walking. She is a nondiabetic and non-smoker. The patient had a venous Doppler on 10/09/2018 that was negative for a DVT and no reflux noted in the greater saphenous vein. No comment on superficial vein thrombosis. Past medical history includes chronic kidney disease stage III, hypothyroidism, glaucoma, history of venous ulcer, hypertension ABIs in our clinic were not obtainable on the left even with the Doppler. 0.56 on the right 5/20-Patient returns to clinic after 1 week, she was started on Prisma with every other day dressing changes, she has noticed pain with leg up, but denies pain with ambulation. Dopplers are scheduled for June 4 and ABIs are not obtained in clinic 5/27 still using Prisma. Arterial studies are booked for June 4. We are still not using compression due to the complete absence of peripheral pulses in the right foot and a very low ABI. In passing the patient mentioned a very painful area on her lateral right ankle just posterior to the malleolus.  She has a very small nodule. Very tender. I thought this might have a core and I removed some eschar on the top of this but  there was nothing to drain. This does not look as though it should be so tender. There was no purulence nothing I thought I could culture. I wondered whether this might be a dermatofibroma however I do not think these are painful. As mentioned I saw no evidence of infection in this use. 6/3; patient is still using Prisma. We do not have a viable wound surface on her original wound area. This continues to require debridement. The small nodule on the right ankle had open this week I am not sure what this is however it is exquisitely painful. 6/10; ARTERIAL studies were done on 6/4. This showed an ABI in the right of 1.01 and a TBI of 0.57. On the left her ABI at the anterior tibial was 1.20. However her TBI was 0.10. Monophasic waveforms mostly bilaterally. She went on to see Dr. Delana Meyer the same day. The patient is undergoing angiography of the left lower extremity with the hope of intervention. He also does felt she had venous insufficiency of both lower extremities. We are using silver collagen to the wounds 6/17. The patient had her angiogram on 03/11/2019. She had angioplasty of the left anterior tibial artery. Angioplasty of the SFA and popliteal arteries. We have been using silver collagen to her 2 wound areas Electronic Signature(s) Signed: 03/19/2019 6:03:55 PM By: Linton Ham MD Entered By: Linton Ham on 03/19/2019 14:03:19 Deanna White ([id number]) -------------------------------------------------------------------------------- Physical Exam Details Patient Name: Deanna White Date of Service: 03/19/2019 1:00 PM Medical Record Number: [id number] Patient Account Number: [id number] Date of Birth/Sex: [birth year] (85 y.o. F) Treating RN: Cornell Barman Primary Care Provider: Deborra Medina Other Clinician: Referring Provider: Deborra Medina Treating  Provider/Extender: Tito Dine in Treatment: 5 Constitutional Sitting or standing Blood Pressure is within target range for patient.. Pulse regular and within target range for patient.Marland Kitchen Respirations regular, non-labored and within target range.. Temperature is normal and within the target range for the patient.Marland Kitchen appears in no distress. Eyes Conjunctivae clear. No discharge. Cardiovascular She now has bounding pulses at the dorsalis pedis and a palpable pulse at the posterior tibial. Notes Wound exam; posterior lateral calf still tightly adherent necrotic debris. Now after revascularization using a #3 curette I aggressively debrided this. She does not tolerate it well but the wound cleans up quite nicely. The small distal area also was debrided there is a small open area here as well. Hemostasis with direct pressure Electronic Signature(s) Signed: 03/19/2019 6:03:55 PM By: Linton Ham MD Entered By: Linton Ham on 03/19/2019 14:04:34 Deanna White ([id number]) -------------------------------------------------------------------------------- Physician Orders Details Patient Name: Deanna White Date of Service: 03/19/2019 1:00 PM Medical Record Number: [id number] Patient Account Number: [id number] Date of Birth/Sex: 1924-10-23 (85 y.o. F) Treating RN: Cornell Barman Primary Care Provider: Deborra Medina Other Clinician: Referring Provider: Deborra Medina Treating Provider/Extender: Tito Dine in Treatment: 5 Verbal / Phone Orders: No Diagnosis Coding Wound Cleansing Wound #1 Left,Posterior Lower Leg o Clean wound with Normal Saline. o May Shower, gently pat wound dry prior to applying new dressing. Wound #2 Left,Lateral Malleolus o Clean wound with Normal Saline. o May Shower, gently pat wound dry prior to applying new dressing. Anesthetic (add to Medication List) Wound #1 Left,Posterior Lower Leg o Topical Lidocaine 4% cream applied to wound  bed prior to debridement (In Clinic Only). o Benzocaine Topical Anesthetic Spray applied to wound bed prior to debridement (In Clinic Only). Wound #2 Left,Lateral Malleolus o Topical Lidocaine 4% cream applied to wound bed  prior to debridement (In Clinic Only). o Benzocaine Topical Anesthetic Spray applied to wound bed prior to debridement (In Clinic Only). Primary Wound Dressing Wound #1 Left,Posterior Lower Leg o Silver Collagen Wound #2 Left,Lateral Malleolus o Silver Collagen Secondary Dressing Wound #1 Left,Posterior Lower Leg o Conform/Kerlix - Lightly to secure collagen in place. Wound #2 Left,Lateral Malleolus o Conform/Kerlix - Lightly to secure collagen in place. Dressing Change Frequency Wound #1 Left,Posterior Lower Leg o Change Dressing Monday, Wednesday, Friday Wound #2 Left,Lateral Malleolus o Change Dressing Monday, Wednesday, Friday Follow-up Appointments Wound #1 Left,Posterior Lower Leg o Return Appointment in 1 week. Deanna White, Deanna White ([id number]) Wound #2 Left,Lateral Malleolus o Return Appointment in 1 week. Edema Control Wound #1 Left,Posterior Lower Leg o Elevate legs to the level of the heart and pump ankles as often as possible Wound #2 Left,Lateral Malleolus o Elevate legs to the level of the heart and pump ankles as often as possible Electronic Signature(s) Signed: 03/19/2019 6:03:55 PM By: Linton Ham MD Signed: 03/20/2019 4:58:07 PM By: Gretta Cool, BSN, RN, CWS, Kim RN, BSN Entered By: Gretta Cool, BSN, RN, CWS, Kim on 03/19/2019 13:44:18 Deanna White, Deanna White ([id number]) -------------------------------------------------------------------------------- Problem List Details Patient Name: Deanna White Date of Service: 03/19/2019 1:00 PM Medical Record Number: [id number] Patient Account Number: [id number] Date of Birth/Sex: 11-16-1924 (85 y.o. F) Treating RN: Cornell Barman Primary Care Provider: Deborra Medina Other Clinician: Referring  Provider: Deborra Medina Treating Provider/Extender: Tito Dine in Treatment: 5 Active Problems ICD-10 Evaluated Encounter Code Description Active Date Today Diagnosis L97.221 Non-pressure chronic ulcer of left calf limited to breakdown of 02/12/2019 No Yes skin I87.332 Chronic venous hypertension (idiopathic) with ulcer and 02/12/2019 No Yes inflammation of left lower extremity I70.242 Atherosclerosis of native arteries of left leg with ulceration of 02/12/2019 No Yes calf Inactive Problems Resolved Problems Electronic Signature(s) Signed: 03/19/2019 6:03:55 PM By: Linton Ham MD Entered By: Linton Ham on 03/19/2019 14:01:04 Deanna White ([id number]) -------------------------------------------------------------------------------- Progress Note Details Patient Name: Deanna White Date of Service: 03/19/2019 1:00 PM Medical Record Number: [id number] Patient Account Number: [id number] Date of Birth/Sex: 03/14/1925 (85 y.o. F) Treating RN: Cornell Barman Primary Care Provider: Deborra Medina Other Clinician: Referring Provider: Deborra Medina Treating Provider/Extender: Tito Dine in Treatment: 5 Subjective History of Present Illness (HPI) 02/12/2019 Admission This is a very functional 85 year old woman who drove herself to our clinic today. She is concerned about a wound on her left posterior calf. This apparently started 4 to 6 weeks ago at which time she developed increasing swelling and redness in her lower leg. She saw her primary doctor in April who thought this was a venous insufficiency ulcer with some degree of good venous stasis. She did receive antibiotics as well as triamcinolone cream for the inflammation. The patient complains of a lot of pain both in the wound and also in the lateral part of her ankle which is especially worse at night and relieved by standing or walking. She is a nondiabetic and non-smoker. The patient had a venous  Doppler on 10/09/2018 that was negative for a DVT and no reflux noted in the greater saphenous vein. No comment on superficial vein thrombosis. Past medical history includes chronic kidney disease stage III, hypothyroidism, glaucoma, history of venous ulcer, hypertension ABIs in our clinic were not obtainable on the left even with the Doppler. 0.56 on the right 5/20-Patient returns to clinic after 1 week, she was started on Prisma with every other day dressing changes, she has  noticed pain with leg up, but denies pain with ambulation. Dopplers are scheduled for June 4 and ABIs are not obtained in clinic 5/27 still using Prisma. Arterial studies are booked for June 4. We are still not using compression due to the complete absence of peripheral pulses in the right foot and a very low ABI. In passing the patient mentioned a very painful area on her lateral right ankle just posterior to the malleolus. She has a very small nodule. Very tender. I thought this might have a core and I removed some eschar on the top of this but there was nothing to drain. This does not look as though it should be so tender. There was no purulence nothing I thought I could culture. I wondered whether this might be a dermatofibroma however I do not think these are painful. As mentioned I saw no evidence of infection in this use. 6/3; patient is still using Prisma. We do not have a viable wound surface on her original wound area. This continues to require debridement. The small nodule on the right ankle had open this week I am not sure what this is however it is exquisitely painful. 6/10; ARTERIAL studies were done on 6/4. This showed an ABI in the right of 1.01 and a TBI of 0.57. On the left her ABI at the anterior tibial was 1.20. However her TBI was 0.10. Monophasic waveforms mostly bilaterally. She went on to see Dr. Delana Meyer the same day. The patient is undergoing angiography of the left lower extremity with the hope of  intervention. He also does felt she had venous insufficiency of both lower extremities. We are using silver collagen to the wounds 6/17. The patient had her angiogram on 03/11/2019. She had angioplasty of the left anterior tibial artery. Angioplasty of the SFA and popliteal arteries. We have been using silver collagen to her 2 wound areas Deanna White, Deanna S. ([id number]) Objective Constitutional Sitting or standing Blood Pressure is within target range for patient.. Pulse regular and within target range for patient.Marland Kitchen Respirations regular, non-labored and within target range.. Temperature is normal and within the target range for the patient.Marland Kitchen appears in no distress. Vitals Time Taken: 1:05 PM, Height: 62 in, Weight: 118 lbs, BMI: 21.6, Temperature: 97.9 F, Pulse: 70 bpm, Respiratory Rate: 16 breaths/min, Blood Pressure: 111/94 mmHg. Eyes Conjunctivae clear. No discharge. Cardiovascular She now has bounding pulses at the dorsalis pedis and a palpable pulse at the posterior tibial. General Notes: Wound exam; posterior lateral calf still tightly adherent necrotic debris. Now after revascularization using a #3 curette I aggressively debrided this. She does not tolerate it well but the wound cleans up quite nicely. The small distal area also was debrided there is a small open area here as well. Hemostasis with direct pressure Integumentary (Hair, Skin) Wound #1 status is Open. Original cause of wound was Gradually Appeared. The wound is located on the Left,Posterior Lower Leg. The wound measures 0.9cm length x 0.7cm width x 0.1cm depth; 0.495cm^2 area and 0.049cm^3 volume. There is Fat Layer (Subcutaneous Tissue) Exposed exposed. There is no tunneling or undermining noted. There is a medium amount of serous drainage noted. The wound margin is flat and intact. There is no granulation within the wound bed. There is a large (67-100%) amount of necrotic tissue within the wound bed including Adherent  Slough. Wound #2 status is Open. Original cause of wound was Gradually Appeared. The wound is located on the Left,Lateral Malleolus. The wound measures 0.1cm length x 0.1cm  width x 0.1cm depth; 0.008cm^2 area and 0.001cm^3 volume. There is no tunneling or undermining noted. There is a none present amount of drainage noted. The wound margin is indistinct and nonvisible. There is no granulation within the wound bed. There is no necrotic tissue within the wound bed. Assessment Active Problems ICD-10 Non-pressure chronic ulcer of left calf limited to breakdown of skin Chronic venous hypertension (idiopathic) with ulcer and inflammation of left lower extremity Atherosclerosis of native arteries of left leg with ulceration of calf Procedures Wound #1 Pre-procedure diagnosis of Wound #1 is a Venous Leg Ulcer located on the Left,Posterior Lower Leg .Severity of Tissue Pre Debridement is: Fat layer exposed. There was a Excisional Skin/Subcutaneous Tissue Debridement with a total area of 0.63 sq cm performed by Ricard Dillon, MD. With the following instrument(s): Curette to remove Viable and Non-Viable Deanna White, Deanna S. ([id number]) tissue/material. Material removed includes Subcutaneous Tissue and Slough and after achieving pain control using Lidocaine. No specimens were taken. A time out was conducted at 13:42, prior to the start of the procedure. A Moderate amount of bleeding was controlled with Pressure. The procedure was tolerated well. Post Debridement Measurements: 0.9cm length x 0.7cm width x 0.2cm depth; 0.099cm^3 volume. Character of Wound/Ulcer Post Debridement is stable. Severity of Tissue Post Debridement is: Fat layer exposed. Post procedure Diagnosis Wound #1: Same as Pre-Procedure Plan Wound Cleansing: Wound #1 Left,Posterior Lower Leg: Clean wound with Normal Saline. May Shower, gently pat wound dry prior to applying new dressing. Wound #2 Left,Lateral Malleolus: Clean wound with  Normal Saline. May Shower, gently pat wound dry prior to applying new dressing. Anesthetic (add to Medication List): Wound #1 Left,Posterior Lower Leg: Topical Lidocaine 4% cream applied to wound bed prior to debridement (In Clinic Only). Benzocaine Topical Anesthetic Spray applied to wound bed prior to debridement (In Clinic Only). Wound #2 Left,Lateral Malleolus: Topical Lidocaine 4% cream applied to wound bed prior to debridement (In Clinic Only). Benzocaine Topical Anesthetic Spray applied to wound bed prior to debridement (In Clinic Only). Primary Wound Dressing: Wound #1 Left,Posterior Lower Leg: Silver Collagen Wound #2 Left,Lateral Malleolus: Silver Collagen Secondary Dressing: Wound #1 Left,Posterior Lower Leg: Conform/Kerlix - Lightly to secure collagen in place. Wound #2 Left,Lateral Malleolus: Conform/Kerlix - Lightly to secure collagen in place. Dressing Change Frequency: Wound #1 Left,Posterior Lower Leg: Change Dressing Monday, Wednesday, Friday Wound #2 Left,Lateral Malleolus: Change Dressing Monday, Wednesday, Friday Follow-up Appointments: Wound #1 Left,Posterior Lower Leg: Return Appointment in 1 week. Wound #2 Left,Lateral Malleolus: Return Appointment in 1 week. Edema Control: Wound #1 Left,Posterior Lower Leg: Elevate legs to the level of the heart and pump ankles as often as possible Wound #2 Left,Lateral Malleolus: Elevate legs to the level of the heart and pump ankles as often as possible 1. Silver collagen to both wound areas 2. I was hoping to put her in compression however I just do not think she would stand it. She is still complaining of pain. I had Kellett, Dorisann S. ([id number]) hoped that the revascularization would relieve some of this but if so I am just not hearing it 3. She has swelling in her legs which I think is mostly venous insufficiency. I would like to put her in compression. If the wounds are not progressing next week I will reconsider  this option Electronic Signature(s) Signed: 03/19/2019 6:03:55 PM By: Linton Ham MD Entered By: Linton Ham on 03/19/2019 14:07:57 Deanna White ([id number]) -------------------------------------------------------------------------------- SuperBill Details Patient Name: Deanna White Date of Service:  03/19/2019 Medical Record Number: [id number] Patient Account Number: [id number] Date of Birth/Sex: 1925-05-03 (85 y.o. F) Treating RN: Cornell Barman Primary Care Provider: Deborra Medina Other Clinician: Referring Provider: Deborra Medina Treating Provider/Extender: Tito Dine in Treatment: 5 Diagnosis Coding ICD-10 Codes Code Description [phone] Non-pressure chronic ulcer of left calf limited to breakdown of skin I87.332 Chronic venous hypertension (idiopathic) with ulcer and inflammation of left lower extremity I70.242 Atherosclerosis of native arteries of left leg with ulceration of calf Facility Procedures CPT4 Code: [id number] Description: 80881 - DEB SUBQ TISSUE 20 SQ CM/< ICD-10 Diagnosis Description L97.221 Non-pressure chronic ulcer of left calf limited to breakdown Modifier: of skin Quantity: 1 Physician Procedures CPT4 Code: 1031594 Description: 58592 - WC PHYS SUBQ TISS 20 SQ CM ICD-10 Diagnosis Description L97.221 Non-pressure chronic ulcer of left calf limited to breakdown Modifier: of skin Quantity: 1 Electronic Signature(s) Signed: 03/19/2019 6:03:55 PM By: Linton Ham MD Entered By: Linton Ham on 03/19/2019 14:08:19

## 2019-03-20 NOTE — Progress Notes (Addendum)
LAKIYAH, ARNTSON ([id number]) Visit Report for 03/19/2019 Arrival Information Details Patient Name: Deanna White, Deanna White Date of Service: 03/19/2019 1:00 PM Medical Record Number: [id number] Patient Account Number: [id number] Date of Birth/Sex: 29-Apr-1925 (85 y.o. F) Treating RN: Harold Barban Primary Care Oneal Biglow: Deborra Medina Other Clinician: Referring Dathan Attia: Deborra Medina Treating Zyshawn Bohnenkamp/Extender: Tito Dine in Treatment: 5 Visit Information History Since Last Visit Added or deleted any medications: No Patient Arrived: Ambulatory Any new allergies or adverse reactions: No Arrival Time: 13:08 Had a fall or experienced change in No Accompanied By: self activities of daily living that may affect Transfer Assistance: None risk of falls: Patient Identification Verified: Yes Signs or symptoms of abuse/neglect since last visito No Secondary Verification Process Yes Hospitalized since last visit: No Completed: Has Dressing in Place as Prescribed: Yes Patient Has Alerts: Yes Pain Present Now: Yes Patient Alerts: Patient on Blood Thinner Aspirin 81 mg ABI (R) 1.01 (L) 1.20 TBI (R) .57 (L) .10 03/06/2019 Electronic Signature(s) Signed: 03/21/2019 12:59:34 PM By: Gretta Cool, BSN, RN, CWS, Kim RN, BSN Previous Signature: 03/19/2019 4:39:36 PM Version By: Harold Barban Entered By: Gretta Cool BSN, RN, CWS, Kim on 03/21/2019 12:59:34 Deanna White ([id number]) -------------------------------------------------------------------------------- Encounter Discharge Information Details Patient Name: Deanna White Date of Service: 03/19/2019 1:00 PM Medical Record Number: [id number] Patient Account Number: [id number] Date of Birth/Sex: 1925/05/31 (85 y.o. F) Treating RN: Cornell Barman Primary Care Hung Rhinesmith: Deborra Medina Other Clinician: Referring Samarie Pinder: Deborra Medina Treating Trenia Tennyson/Extender: Tito Dine in Treatment: 5 Encounter Discharge Information Items Post  Procedure Vitals Discharge Condition: Stable Temperature (F): 97.9 Ambulatory Status: Ambulatory Pulse (bpm): 70 Discharge Destination: Home Respiratory Rate (breaths/min): 16 Transportation: Private Auto Blood Pressure (mmHg): 111/94 Accompanied By: self Schedule Follow-up Appointment: Yes Clinical Summary of Care: Electronic Signature(s) Signed: 03/20/2019 4:58:07 PM By: Gretta Cool, BSN, RN, CWS, Kim RN, BSN Entered By: Gretta Cool, BSN, RN, CWS, Kim on 03/19/2019 13:50:18 Deanna White ([id number]) -------------------------------------------------------------------------------- Lower Extremity Assessment Details Patient Name: Deanna White Date of Service: 03/19/2019 1:00 PM Medical Record Number: [id number] Patient Account Number: [id number] Date of Birth/Sex: April 23, 1925 (85 y.o. F) Treating RN: Harold Barban Primary Care Zamaya Rapaport: Deborra Medina Other Clinician: Referring Tien Aispuro: Deborra Medina Treating Quinterious Walraven/Extender: Tito Dine in Treatment: 5 Vascular Assessment Pulses: Dorsalis Pedis Palpable: [Right:Yes] Posterior Tibial Palpable: [Right:Yes] Electronic Signature(s) Signed: 03/19/2019 4:39:36 PM By: Harold Barban Entered By: Harold Barban on 03/19/2019 13:14:41 Deanna White ([id number]) -------------------------------------------------------------------------------- Multi Wound Chart Details Patient Name: Deanna White Date of Service: 03/19/2019 1:00 PM Medical Record Number: [id number] Patient Account Number: [id number] Date of Birth/Sex: 03/30/1925 (85 y.o. 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 N/A Comorbid History: Cataracts, Hypertension Cataracts, Hypertension N/A Date Acquired: 10/02/2018 09/30/2018 N/A Weeks of Treatment: 5 3 N/A Wound Status: Open Open N/A Measurements L x W x D 0.9x0.7x0.1 0.1x0.1x0.1 N/A (cm) Area (cm) : 0.495 0.008 N/A Volume (cm) : 0.049 0.001 N/A % Reduction in Area: -40.20% 0.00% N/A % Reduction in Volume: -40.00% 0.00% N/A Classification: Full Thickness Without Unclassifiable N/A  Exposed Support Structures Exudate Amount: Medium None Present N/A Exudate Type: Serous N/A N/A Exudate Color: amber N/A N/A Wound Margin: Flat and Intact Indistinct, nonvisible N/A Granulation Amount: None Present (0%) None Present (0%) N/A Necrotic Amount: Large (67-100%) None Present (0%) N/A Exposed Structures: Fat Layer (Subcutaneous Fascia: No N/A Tissue) Exposed: Yes Fat Layer (Subcutaneous Fascia: No Tissue) Exposed: No Tendon: No Tendon: No Muscle: No Muscle: No Joint: No Joint: No Bone: No Bone: No Deanna White, Deanna S. ([id number]) Epithelialization: None Large (67-100%) N/A Debridement: Debridement - Excisional N/A N/A Pre-procedure 13:42 N/A N/A Verification/Time Out Taken: Pain Control: Lidocaine N/A N/A Tissue Debrided: Subcutaneous, Slough N/A N/A Level: Skin/Subcutaneous Tissue N/A N/A Debridement Area (sq cm): 0.63 N/A N/A Instrument: Curette N/A N/A Bleeding: Moderate N/A N/A Hemostasis Achieved: Pressure N/A N/A Debridement Treatment Procedure was tolerated well N/A N/A Response: Post Debridement 0.9x0.7x0.2 N/A N/A Measurements L x W x D (cm) Post Debridement Volume: 0.099 N/A N/A (cm) Procedures Performed: Debridement N/A N/A Treatment Notes Wound #1 (Left, Posterior Lower  Leg) Notes Prisma, gauze, conform and stretchnet to secure Wound #2 (Left, Lateral Malleolus) Notes Prisma, gauze, conform and stretchnet to secure Electronic Signature(s) Signed: 03/19/2019 6:03:55 PM By: Linton Ham MD Entered By: Linton Ham on 03/19/2019 14:01:24 Deanna White ([id number]) -------------------------------------------------------------------------------- [place] Details Patient Name: Deanna White Date of Service: 03/19/2019 1:00 PM Medical Record Number: [id number] Patient Account Number: [id number] Date of Birth/Sex: 23-Jul-1925 (85 y.o. F) Treating RN: Cornell Barman Primary Care Modesty Rudy: Deborra Medina Other Clinician: Referring Dyanara Cozza: Deborra Medina Treating Timara Loma/Extender: Tito Dine in Treatment: 5 Active Inactive Necrotic Tissue Nursing Diagnoses: Impaired tissue integrity related to necrotic/devitalized tissue Knowledge deficit related to management of necrotic/devitalized tissue Goals: Necrotic/devitalized tissue will be minimized in the wound bed Date Initiated: 02/12/2019 Target Resolution Date: 02/19/2019 Goal Status: Active Interventions: Assess patient pain level pre-, during and post procedure and prior to discharge Treatment Activities: Apply topical anesthetic as ordered : 02/12/2019 Excisional debridement : 02/12/2019 Notes: Orientation to the Wound Care Program Nursing Diagnoses: Knowledge deficit related to the wound healing center program Goals: Patient/caregiver will verbalize understanding of the [place] Date Initiated: 02/12/2019 Target Resolution Date: 02/12/2019 Goal Status: Active Interventions: Provide education on orientation to the wound center Notes: Pain, Acute or Chronic Nursing Diagnoses: Pain, acute or chronic: actual or potential Goals: Patient will verbalize adequate pain control and receive pain control interventions during procedures as needed Deanna White, Deanna White ([id number]) Date Initiated: 02/12/2019 Target Resolution Date: 02/19/2019 Goal Status: Active Interventions: Assess comfort goal upon admission Notes: Wound/Skin Impairment Nursing Diagnoses: Impaired tissue integrity Goals: Patient/caregiver will verbalize understanding of skin care regimen Date Initiated: 02/12/2019 Target Resolution Date: 02/19/2019 Goal Status: Active Ulcer/skin breakdown will have a volume reduction of 30% by week 4 Date Initiated: 02/12/2019 Target Resolution Date: 03/15/2019 Goal Status: Active Interventions: Assess ulceration(s) every visit Notes: Electronic Signature(s) Signed: 03/20/2019 4:58:07 PM By: Gretta Cool, BSN, RN, CWS, Kim RN, BSN Entered By: Gretta Cool, BSN, RN, CWS, Kim on 03/19/2019 13:40:25 Deanna White ([id number]) -------------------------------------------------------------------------------- Pain Assessment Details Patient Name: Deanna White Date of Service: 03/19/2019 1:00 PM Medical Record Number: [id number] Patient Account Number: [id number] Date of Birth/Sex: August 25, 1925 (85 y.o. F) Treating RN: Harold Barban Primary Care Minnette Merida: Deborra Medina Other Clinician: Referring Greenly Rarick: Deborra Medina Treating Lanisha Stepanian/Extender: Tito Dine in Treatment: 5 Active Problems Location of Pain Severity and Description of Pain Patient Has Paino Yes Site Locations Rate the pain. Current  Pain Level: 2 Pain Management and Medication Current Pain Management: Electronic Signature(s) Signed: 03/19/2019 4:39:36 PM By: Harold Barban Entered By: Harold Barban on 03/19/2019 13:09:39 Deanna White ([id number]) -------------------------------------------------------------------------------- Patient/Caregiver Education Details Patient Name: Deanna White Date of Service: 03/19/2019 1:00 PM Medical Record Number: [id number] Patient Account Number: [id number] Date of Birth/Gender: 1925/08/19 (85 y.o. F) Treating RN: Cornell Barman Primary Care Physician: Deborra Medina Other Clinician: Referring Physician: Deborra Medina Treating Physician/Extender: Tito Dine in Treatment: 5 Education Assessment Education Provided To: Patient Education Topics Provided Wound/Skin Impairment: Handouts: Caring for Your Ulcer Methods: Demonstration, Explain/Verbal Responses: State content correctly Electronic Signature(s) Signed: 03/20/2019 4:58:07 PM By: Gretta Cool, BSN, RN, CWS, Kim RN, BSN Entered By: Gretta Cool, BSN, RN, CWS, Kim on 03/19/2019 13:49:09 Deanna White ([id number]) -------------------------------------------------------------------------------- Wound Assessment Details Patient Name: Deanna White Date of Service: 03/19/2019 1:00 PM Medical Record Number: [id number] Patient Account Number: [id number] Date of Birth/Sex: Apr 21, 1925 (85 y.o. F) Treating RN: Harold Barban Primary Care Elisheva Fallas: Deborra Medina Other Clinician: Referring Ewart Carrera: Deborra Medina Treating Egon Dittus/Extender: Tito Dine in Treatment: 5 Wound Status Wound Number: 1 Primary Etiology: Venous Leg Ulcer Wound Location: Left Lower Leg - Posterior Secondary Etiology: Arterial Insufficiency Ulcer Wounding Event: Gradually Appeared Wound Status: Open Date Acquired: 10/02/2018 Comorbid History: Cataracts, Hypertension Weeks Of Treatment: 5 Clustered Wound: No Photos Wound Measurements Length: (cm) 0.9 Width: (cm) 0.7 Depth: (cm) 0.1 Area: (cm) 0.495 Volume: (cm) 0.049 % Reduction in Area: -40.2% % Reduction in Volume: -40% Epithelialization: None Tunneling: No Undermining: No Wound Description Full Thickness Without Exposed Support Foul O Classification: Structures Slough Wound Margin: Flat and Intact Exudate Medium Amount: Exudate Type: Serous Exudate Color: amber dor After Cleansing: No /Fibrino Yes Wound Bed Granulation Amount: None Present (0%) Exposed Structure Necrotic Amount: Large  (67-100%) Fascia Exposed: No Necrotic Quality: Adherent Slough Fat Layer (Subcutaneous Tissue) Exposed: Yes Tendon Exposed: No Muscle Exposed: No Joint Exposed: No Bone Exposed: No Deanna White, Deanna S. ([id number]) Treatment Notes Wound #1 (Left, Posterior Lower Leg) Notes Prisma, gauze, conform and stretchnet to secure Electronic Signature(s) Signed: 03/19/2019 4:39:36 PM By: Harold Barban Entered By: Harold Barban on 03/19/2019 13:13:31 Deanna White ([id number]) -------------------------------------------------------------------------------- Wound Assessment Details Patient Name: Deanna White Date of Service: 03/19/2019 1:00 PM Medical Record Number: [id number] Patient Account Number: [id number] Date of Birth/Sex: 1925-03-01 (85 y.o. F) Treating RN: Harold Barban Primary Care Cutter Passey: Deborra Medina Other Clinician: Referring Tyra Michelle: Deborra Medina Treating David Towson/Extender: Tito Dine in Treatment: 5 Wound Status Wound Number: 2 Primary Etiology: Venous Leg Ulcer Wound Location: Left Malleolus - Lateral Secondary Etiology: Arterial Insufficiency Ulcer Wounding Event: Gradually Appeared Wound Status: Open Date Acquired: 09/30/2018 Comorbid History: Cataracts, Hypertension Weeks Of Treatment: 3 Clustered Wound: No Photos Wound Measurements Length: (cm) 0.1 Width: (cm) 0.1 Depth: (cm) 0.1 Area: (cm) 0.008 Volume: (cm) 0.001 % Reduction in Area: 0% % Reduction in Volume: 0% Epithelialization: Large (67-100%) Tunneling: No Undermining: No Wound Description Classification: Unclassifiable Foul Odor Wound Margin: Indistinct, nonvisible Slough/Fi Exudate Amount: None Present After Cleansing: No brino No Wound Bed Granulation Amount: None Present (0%) Exposed Structure Necrotic Amount: None Present (0%) Fascia Exposed: No Fat Layer (Subcutaneous Tissue) Exposed: No Tendon Exposed: No Muscle Exposed: No Joint Exposed: No Bone Exposed:  No Treatment Notes Wound #2 (Left, Lateral Malleolus) Notes Deanna White, RUMINSKI. ([id number]) Prisma, gauze, conform and stretchnet to secure Electronic Signature(s) Signed: 03/19/2019 4:39:36 PM By: Harold Barban Entered By: Harold Barban on 03/19/2019 13:14:25  Deanna White, Deanna White ([id number]) -------------------------------------------------------------------------------- Vitals Details Patient Name: Deanna White Date of Service: 03/19/2019 1:00 PM Medical Record Number: [id number] Patient Account Number: [id number] Date of Birth/Sex: 04-26-1925 (85 y.o. F) Treating RN: Harold Barban Primary Care Leith Hedlund: Deborra Medina Other Clinician: Referring Jacqulin Brandenburger: Deborra Medina Treating Verdis Bassette/Extender: Tito Dine in Treatment: 5 Vital Signs Time Taken: 13:05 Temperature (F): 97.9 Height (in): 62 Pulse (bpm): 70 Weight (lbs): 118 Respiratory Rate (breaths/min): 16 Body Mass Index (BMI): 21.6 Blood Pressure (mmHg): 111/94 Reference Range: 80 - 120 mg / dl Electronic Signature(s) Signed: 03/19/2019 4:39:36 PM By: Harold Barban Entered By: Harold Barban on 03/19/2019 13:11:12

## 2019-03-26 ENCOUNTER — Ambulatory Visit
Admission: RE | Admit: 2019-03-26 | Discharge: 2019-03-26 | Disposition: A | Discharge status: Home / Self Care | Payer: Medicare Other | Source: Ambulatory Visit | Attending: Internal Medicine | Admitting: Internal Medicine

## 2019-03-26 ENCOUNTER — Other Ambulatory Visit: Payer: Self-pay

## 2019-03-26 ENCOUNTER — Other Ambulatory Visit: Payer: Self-pay | Admitting: Internal Medicine

## 2019-03-26 ENCOUNTER — Encounter: Payer: Medicare Other | Admitting: Internal Medicine

## 2019-03-26 DIAGNOSIS — S90552D Superficial foreign body, left ankle, subsequent encounter: Secondary | ICD-10-CM | POA: Diagnosis present

## 2019-03-26 DIAGNOSIS — I87332 Chronic venous hypertension (idiopathic) with ulcer and inflammation of left lower extremity: Secondary | ICD-10-CM | POA: Diagnosis not present

## 2019-03-29 NOTE — Progress Notes (Signed)
CINDY, FULLMAN ([id number]) Visit Report for 03/26/2019 Debridement Details Patient Name: Deanna White, Deanna White Date of Service: 03/26/2019 2:45 PM Medical Record Number: [id number] Patient Account Number: [id number] Date of Birth/Sex: May 16, 1925 (85 y.o. F) Treating RN: Cornell Barman Primary Care Provider: Deborra Medina Other Clinician: Referring Provider: Deborra Medina Treating Provider/Extender: Tito Dine in Treatment: 6 Debridement Performed for Wound #1 Left,Posterior Lower Leg Assessment: Performed By: Physician Ricard Dillon, MD Debridement Type: Debridement Severity of Tissue Pre Fat layer exposed Debridement: Level of Consciousness (Pre- Awake and Alert procedure): Pre-procedure Verification/Time Yes - 15:03 Out Taken: Start Time: 15:03 Pain Control: Lidocaine Total Area Debrided (L x W): 0.8 (cm) x 0.5 (cm) = 0.4 (cm) Tissue and other material Viable, Non-Viable, Slough, Subcutaneous, Slough debrided: Level: Skin/Subcutaneous Tissue Debridement Description: Excisional Instrument: Curette Bleeding: Minimum Hemostasis Achieved: Pressure End Time: 15:05 Response to Treatment: Procedure was tolerated well Level of Consciousness Awake and Alert (Post-procedure): Post Debridement Measurements of Total Wound Length: (cm) 0.8 Width: (cm) 0.5 Depth: (cm) 0.1 Volume: (cm) 0.031 Character of Wound/Ulcer Post Debridement: Stable Severity of Tissue Post Debridement: Fat layer exposed Post Procedure Diagnosis Same as Pre-procedure Electronic Signature(s) Signed: 03/26/2019 4:13:17 PM By: Linton Ham MD Signed: 03/28/2019 4:38:11 PM By: Gretta Cool, BSN, RN, CWS, Kim RN, BSN Entered By: Linton Ham on 03/26/2019 15:08:48 [name] ([id number]) -------------------------------------------------------------------------------- HPI Details Patient Name: [name] Date of Service: 03/26/2019 2:45 PM Medical Record Number: [id number] Patient Account  Number: [id number] Date of Birth/Sex: [birth year] (85 y.o. F) Treating RN: Cornell Barman Primary Care Provider: Deborra Medina Other Clinician: Referring Provider: Deborra Medina Treating Provider/Extender: Tito Dine in Treatment: 6 History of Present Illness HPI Description: 02/12/2019 Admission This is a very functional 85 year old woman who drove herself to our clinic today. She is concerned about a wound on her left posterior calf. This apparently started 4 to 6 weeks ago at which time she developed increasing swelling and redness in her lower leg. She saw her primary doctor in April who thought this was a venous insufficiency ulcer with some degree of good venous stasis. She did receive antibiotics as well as triamcinolone cream for the inflammation. The patient complains of a lot of pain both in the wound and also in the lateral part of her ankle which is especially worse at night and relieved by standing or walking. She is a nondiabetic and non-smoker. The patient had a venous Doppler on 10/09/2018 that was negative for a DVT and no reflux noted in the greater saphenous vein. No comment on superficial vein thrombosis. Past medical history includes chronic kidney disease stage III, hypothyroidism, glaucoma, history of venous ulcer, hypertension ABIs in our clinic were not obtainable on the left even with the Doppler. 0.56 on the right 5/20-Patient returns to clinic after 1 week, she was started on Prisma with every other day dressing changes, she has noticed pain with leg up, but denies pain with ambulation. Dopplers are scheduled for June 4 and ABIs are not obtained in clinic 5/27 still using Prisma. Arterial studies are booked for June 4. We are still not using compression due to the complete absence of peripheral pulses in the right foot and a very low ABI. In passing the patient mentioned a very painful area on her lateral right ankle just posterior to the malleolus. She has a  very small nodule. Very tender. I thought this might have a core and I removed some eschar on the top of this but  there was nothing to drain. This does not look as though it should be so tender. There was no purulence nothing I thought I could culture. I wondered whether this might be a dermatofibroma however I do not think these are painful. As mentioned I saw no evidence of infection in this use. 6/3; patient is still using Prisma. We do not have a viable wound surface on her original wound area. This continues to require debridement. The small nodule on the right ankle had open this week I am not sure what this is however it is exquisitely painful. 6/10; ARTERIAL studies were done on 6/4. This showed an ABI in the right of 1.01 and a TBI of 0.57. On the left her ABI at the anterior tibial was 1.20. However her TBI was 0.10. Monophasic waveforms mostly bilaterally. She went on to see Dr. Delana Meyer the same day. The patient is undergoing angiography of the left lower extremity with the hope of intervention. He also does felt she had venous insufficiency of both lower extremities. We are using silver collagen to the wounds 6/17. The patient had her angiogram on 03/11/2019. She had angioplasty of the left anterior tibial artery. Angioplasty of the SFA and popliteal arteries. We have been using silver collagen to her 2 wound areas 6/24; the patient has 1 small remaining wound on the left posterior calf. We are using silver collagen. She has the small nodule on the right lateral ankle. This is healed over. When she first came here this had eschar in the top that I removed and it is now closed over but still extremely tender and somewhat inflamed. She has chronic venous insufficiency, this does not appear to be vascular. Electronic Signature(s) Signed: 03/26/2019 4:13:17 PM By: Linton Ham MD Entered By: Linton Ham on 03/26/2019 15:12:01 ELIANNE, GUBSER ([id number], TENORIO  ([id number]) -------------------------------------------------------------------------------- Physical Exam Details Patient Name: [name] Date of Service: 03/26/2019 2:45 PM Medical Record Number: [id number] Patient Account Number: [id number] Date of Birth/Sex: 07/09/1925 (85 y.o. F) Treating RN: Cornell Barman Primary Care Provider: Deborra Medina Other Clinician: Referring Provider: Deborra Medina Treating Provider/Extender: Tito Dine in Treatment: 6 Constitutional Patient is hypertensive.. Pulse regular and within target range for patient.Marland Kitchen Respirations regular, non-labored and within target range.Marland Kitchen appears in no distress. Notes Wound exam; posterior calf wound tightly adherent debris removed with a #3 curette. The base of this appears healthy are slightly smaller. The small distal area is now nodular. I thought this might be a blister initially but it is not. She is very tender around this area I am not sure what this is Electronic Signature(s) Signed: 03/26/2019 4:13:17 PM By: Linton Ham MD Entered By: Linton Ham on 03/26/2019 15:14:20 [name] ([id number]) -------------------------------------------------------------------------------- Physician Orders Details Patient Name: [name] Date of Service: 03/26/2019 2:45 PM Medical Record Number: [id number] Patient Account Number: [id number] Date of Birth/Sex: 19-Nov-1924 (85 y.o. F) Treating RN: Cornell Barman Primary Care Provider: Deborra Medina Other Clinician: Referring Provider: Deborra Medina Treating Provider/Extender: Tito Dine in Treatment: 6 Verbal / Phone Orders: No Diagnosis Coding Wound Cleansing Wound #1 Left,Posterior Lower Leg o Clean wound with Normal Saline. o May Shower, gently pat wound dry prior to applying new dressing. Wound #2 Left,Lateral Malleolus o Clean wound with Normal Saline. o May Shower, gently pat wound dry prior to applying new  dressing. Anesthetic (add to Medication List) Wound #1 Left,Posterior Lower Leg o Topical Lidocaine 4% cream applied to wound bed  prior to debridement (In Clinic Only). o Benzocaine Topical Anesthetic Spray applied to wound bed prior to debridement (In Clinic Only). Wound #2 Left,Lateral Malleolus o Topical Lidocaine 4% cream applied to wound bed prior to debridement (In Clinic Only). o Benzocaine Topical Anesthetic Spray applied to wound bed prior to debridement (In Clinic Only). Primary Wound Dressing Wound #1 Left,Posterior Lower Leg o Silver Collagen Wound #2 Left,Lateral Malleolus o Silver Collagen Secondary Dressing Wound #1 Left,Posterior Lower Leg o Conform/Kerlix - Lightly to secure collagen in place. Wound #2 Left,Lateral Malleolus o Conform/Kerlix - Lightly to secure collagen in place. Dressing Change Frequency Wound #1 Left,Posterior Lower Leg o Change Dressing Monday, Wednesday, Friday Wound #2 Left,Lateral Malleolus o Change Dressing Monday, Wednesday, Friday Follow-up Appointments Wound #1 Left,Posterior Lower Leg o Return Appointment in 1 week. ROXY, MASTANDREA ([id number]) Wound #2 Left,Lateral Malleolus o Return Appointment in 1 week. Edema Control Wound #1 Left,Posterior Lower Leg o Elevate legs to the level of the heart and pump ankles as often as possible Wound #2 Left,Lateral Malleolus o Elevate legs to the level of the heart and pump ankles as often as possible Radiology o X-ray, ankle - Left Electronic Signature(s) Signed: 03/26/2019 4:13:17 PM By: Linton Ham MD Signed: 03/28/2019 4:38:11 PM By: Gretta Cool, BSN, RN, CWS, Kim RN, BSN Entered By: Gretta Cool, BSN, RN, CWS, Kim on 03/26/2019 15:16:34 [name] ([id number]) -------------------------------------------------------------------------------- Problem List Details Patient Name: [name] Date of Service: 03/26/2019 2:45 PM Medical Record Number:  [id number] Patient Account Number: [id number] Date of Birth/Sex: 09-26-1925 (85 y.o. F) Treating RN: Cornell Barman Primary Care Provider: Deborra Medina Other Clinician: Referring Provider: Deborra Medina Treating Provider/Extender: Tito Dine in Treatment: 6 Active Problems ICD-10 Evaluated Encounter Code Description Active Date Today Diagnosis L97.221 Non-pressure chronic ulcer of left calf limited to breakdown of 02/12/2019 No Yes skin I87.332 Chronic venous hypertension (idiopathic) with ulcer and 02/12/2019 No Yes inflammation of left lower extremity I70.242 Atherosclerosis of native arteries of left leg with ulceration of 02/12/2019 No Yes calf Inactive Problems Resolved Problems Electronic Signature(s) Signed: 03/26/2019 4:13:17 PM By: Linton Ham MD Entered By: Linton Ham on 03/26/2019 15:08:22 [name] ([id number]) -------------------------------------------------------------------------------- Progress Note Details Patient Name: [name] Date of Service: 03/26/2019 2:45 PM Medical Record Number: [id number] Patient Account Number: [id number] Date of Birth/Sex: Sep 26, 1925 (85 y.o. F) Treating RN: Cornell Barman Primary Care Provider: Deborra Medina Other Clinician: Referring Provider: Deborra Medina Treating Provider/Extender: Tito Dine in Treatment: 6 Subjective History of Present Illness (HPI) 02/12/2019 Admission This is a very functional 85 year old woman who drove herself to our clinic today. She is concerned about a wound on her left posterior calf. This apparently started 4 to 6 weeks ago at which time she developed increasing swelling and redness in her lower leg. She saw her primary doctor in April who thought this was a venous insufficiency ulcer with some degree of good venous stasis. She did receive antibiotics as well as triamcinolone cream for the inflammation. The patient complains of a lot of pain both in the wound and  also in the lateral part of her ankle which is especially worse at night and relieved by standing or walking. She is a nondiabetic and non-smoker. The patient had a venous Doppler on 10/09/2018 that was negative for a DVT and no reflux noted in the greater saphenous vein. No comment on superficial vein thrombosis. Past medical history includes chronic kidney disease stage III, hypothyroidism, glaucoma, history of venous  ulcer, hypertension ABIs in our clinic were not obtainable on the left even with the Doppler. 0.56 on the right 5/20-Patient returns to clinic after 1 week, she was started on Prisma with every other day dressing changes, she has noticed pain with leg up, but denies pain with ambulation. Dopplers are scheduled for June 4 and ABIs are not obtained in clinic 5/27 still using Prisma. Arterial studies are booked for June 4. We are still not using compression due to the complete absence of peripheral pulses in the right foot and a very low ABI. In passing the patient mentioned a very painful area on her lateral right ankle just posterior to the malleolus. She has a very small nodule. Very tender. I thought this might have a core and I removed some eschar on the top of this but there was nothing to drain. This does not look as though it should be so tender. There was no purulence nothing I thought I could culture. I wondered whether this might be a dermatofibroma however I do not think these are painful. As mentioned I saw no evidence of infection in this use. 6/3; patient is still using Prisma. We do not have a viable wound surface on her original wound area. This continues to require debridement. The small nodule on the right ankle had open this week I am not sure what this is however it is exquisitely painful. 6/10; ARTERIAL studies were done on 6/4. This showed an ABI in the right of 1.01 and a TBI of 0.57. On the left her ABI at the anterior tibial was 1.20. However her TBI was 0.10.  Monophasic waveforms mostly bilaterally. She went on to see Dr. Delana Meyer the same day. The patient is undergoing angiography of the left lower extremity with the hope of intervention. He also does felt she had venous insufficiency of both lower extremities. We are using silver collagen to the wounds 6/17. The patient had her angiogram on 03/11/2019. She had angioplasty of the left anterior tibial artery. Angioplasty of the SFA and popliteal arteries. We have been using silver collagen to her 2 wound areas 6/24; the patient has 1 small remaining wound on the left posterior calf. We are using silver collagen. She has the small nodule on the right lateral ankle. This is healed over. When she first came here this had eschar in the top that I removed and it is now closed over but still extremely tender and somewhat inflamed. She has chronic venous insufficiency, this does not appear to be vascular. CARNELL, CASAMENTO ([id number]) Objective Constitutional Patient is hypertensive.. Pulse regular and within target range for patient.Marland Kitchen Respirations regular, non-labored and within target range.Marland Kitchen appears in no distress. Vitals Time Taken: 2:47 PM, Height: 62 in, Weight: 118 lbs, BMI: 21.6, Temperature: 98.3 F, Pulse: 79 bpm, Respiratory Rate: 18 breaths/min, Blood Pressure: 192/60 mmHg. General Notes: Wound exam; posterior calf wound tightly adherent debris removed with a #3 curette. The base of this appears healthy are slightly smaller. The small distal area is now nodular. I thought this might be a blister initially but it is not. She is very tender around this area I am not sure what this is Integumentary (Hair, Skin) Wound #1 status is Open. Original cause of wound was Gradually Appeared. The wound is located on the Left,Posterior Lower Leg. The wound measures 0.8cm length x 0.5cm width x 0.1cm depth; 0.314cm^2 area and 0.031cm^3 volume. There is Fat Layer (Subcutaneous Tissue) Exposed exposed. There is no  tunneling  or undermining noted. There is a medium amount of serous drainage noted. The wound margin is flat and intact. There is small (1-33%) pink granulation within the wound bed. There is a large (67-100%) amount of necrotic tissue within the wound bed including Adherent Slough. Wound #2 status is Open. Original cause of wound was Gradually Appeared. The wound is located on the Left,Lateral Malleolus. The wound measures 0.1cm length x 0.1cm width x 0.1cm depth; 0.008cm^2 area and 0.001cm^3 volume. The wound is limited to skin breakdown. There is no tunneling or undermining noted. There is a none present amount of drainage noted. The wound margin is indistinct and nonvisible. There is no granulation within the wound bed. There is no necrotic tissue within the wound bed. Assessment Active Problems ICD-10 Non-pressure chronic ulcer of left calf limited to breakdown of skin Chronic venous hypertension (idiopathic) with ulcer and inflammation of left lower extremity Atherosclerosis of native arteries of left leg with ulceration of calf Procedures Wound #1 Pre-procedure diagnosis of Wound #1 is a Venous Leg Ulcer located on the Left,Posterior Lower Leg .Severity of Tissue Pre Debridement is: Fat layer exposed. There was a Excisional Skin/Subcutaneous Tissue Debridement with a total area of 0.4 sq cm performed by Ricard Dillon, MD. With the following instrument(s): Curette to remove Viable and Non-Viable tissue/material. Material removed includes Subcutaneous Tissue and Slough and after achieving pain control using Lidocaine. No specimens were taken. A time out was conducted at 15:03, prior to the start of the procedure. A Minimum amount of bleeding was controlled with Pressure. The procedure was tolerated well. Post Debridement Measurements: 0.8cm length x 0.5cm width x 0.1cm depth; 0.031cm^3 volume. TANIKA, BRACCO ([id number]) Character of Wound/Ulcer Post Debridement is stable. Severity of  Tissue Post Debridement is: Fat layer exposed. Post procedure Diagnosis Wound #1: Same as Pre-Procedure Plan Wound Cleansing: Wound #1 Left,Posterior Lower Leg: Clean wound with Normal Saline. May Shower, gently pat wound dry prior to applying new dressing. Wound #2 Left,Lateral Malleolus: Clean wound with Normal Saline. May Shower, gently pat wound dry prior to applying new dressing. Anesthetic (add to Medication List): Wound #1 Left,Posterior Lower Leg: Topical Lidocaine 4% cream applied to wound bed prior to debridement (In Clinic Only). Benzocaine Topical Anesthetic Spray applied to wound bed prior to debridement (In Clinic Only). Wound #2 Left,Lateral Malleolus: Topical Lidocaine 4% cream applied to wound bed prior to debridement (In Clinic Only). Benzocaine Topical Anesthetic Spray applied to wound bed prior to debridement (In Clinic Only). Primary Wound Dressing: Wound #1 Left,Posterior Lower Leg: Silver Collagen Wound #2 Left,Lateral Malleolus: Silver Collagen Secondary Dressing: Wound #1 Left,Posterior Lower Leg: Conform/Kerlix - Lightly to secure collagen in place. Wound #2 Left,Lateral Malleolus: Conform/Kerlix - Lightly to secure collagen in place. Dressing Change Frequency: Wound #1 Left,Posterior Lower Leg: Change Dressing Monday, Wednesday, Friday Wound #2 Left,Lateral Malleolus: Change Dressing Monday, Wednesday, Friday Follow-up Appointments: Wound #1 Left,Posterior Lower Leg: Return Appointment in 1 week. Wound #2 Left,Lateral Malleolus: Return Appointment in 1 week. Edema Control: Wound #1 Left,Posterior Lower Leg: Elevate legs to the level of the heart and pump ankles as often as possible Wound #2 Left,Lateral Malleolus: Elevate legs to the level of the heart and pump ankles as often as possible 1. We continue with silver collagen under kerlix and conform on the left posterior leg. #2 the small nodule on the left lateral malleolus that is so tender is of  unclear etiology. This is epithelialized over but it is back to the same condition  is when she first showed this to me but without the surface eschar. It looks like this is inflamed it does not look malignant MAKIAH, FOYE ([id number]) Electronic Signature(s) Signed: 03/26/2019 4:13:17 PM By: Linton Ham MD Entered By: Linton Ham on 03/26/2019 15:15:46 [name] ([id number]) -------------------------------------------------------------------------------- SuperBill Details Patient Name: [name] Date of Service: 03/26/2019 Medical Record Number: [id number] Patient Account Number: [id number] Date of Birth/Sex: 05/31/1925 (85 y.o. F) Treating RN: Cornell Barman Primary Care Provider: Deborra Medina Other Clinician: Referring Provider: Deborra Medina Treating Provider/Extender: Tito Dine in Treatment: 6 Diagnosis Coding ICD-10 Codes Code Description [phone] Non-pressure chronic ulcer of left calf limited to breakdown of skin I87.332 Chronic venous hypertension (idiopathic) with ulcer and inflammation of left lower extremity I70.242 Atherosclerosis of native arteries of left leg with ulceration of calf Facility Procedures CPT4: Description Modifier Quantity Code [id number] - DEB SUBQ TISSUE 20 SQ CM/< 1 ICD-10 Diagnosis Description L97.221 Non-pressure chronic ulcer of left calf limited to breakdown of skin I87.332 Chronic venous hypertension (idiopathic) with ulcer  and inflammation of left lower extremity Physician Procedures CPT4: Description Modifier Quantity Code [id number] - WC PHYS SUBQ TISS 20 SQ CM 1 ICD-10 Diagnosis Description L97.221 Non-pressure chronic ulcer of left calf limited to breakdown of skin I87.332 Chronic venous hypertension (idiopathic) with ulcer  and inflammation of left lower extremity Electronic Signature(s) Signed: 03/26/2019 4:13:17 PM By: Linton Ham MD Entered By: Linton Ham on 03/26/2019 15:16:06

## 2019-03-29 NOTE — Progress Notes (Signed)
Deanna White, Deanna White ([id number]) Visit Report for 03/26/2019 Arrival Information Details Patient Name: Deanna White, Deanna White Date of Service: 03/26/2019 2:45 PM Medical Record Number: [id number] Patient Account Number: [id number] Date of Birth/Sex: 07/18/1925 (85 y.o. F) Treating RN: Montey Hora Primary Care Makeshia Seat: Deborra Medina Other Clinician: Referring Anastasha Ortez: Deborra Medina Treating Chan Sheahan/Extender: Tito Dine in Treatment: 6 Visit Information History Since Last Visit Added or deleted any medications: No Patient Arrived: Ambulatory Any new allergies or adverse reactions: No Arrival Time: 14:46 Had a fall or experienced change in No Accompanied By: self activities of daily living that may affect Transfer Assistance: None risk of falls: Patient Identification Verified: Yes Signs or symptoms of abuse/neglect since last visito No Secondary Verification Process Yes Hospitalized since last visit: No Completed: Implantable device outside of the clinic excluding No Patient Has Alerts: Yes cellular tissue based products placed in the center Patient Alerts: Patient on Blood since last visit: Thinner Has Dressing in Place as Prescribed: Yes Aspirin 81 mg Pain Present Now: Yes ABI (R) 1.01 (L) 1.20 TBI (R) .57 (L) .10 03/06/2019 Electronic Signature(s) Signed: 03/27/2019 5:10:09 PM By: Montey Hora Entered By: Montey Hora on 03/26/2019 14:46:47 Deanna White ([id number]) -------------------------------------------------------------------------------- Encounter Discharge Information Details Patient Name: Deanna White Date of Service: 03/26/2019 2:45 PM Medical Record Number: [id number] Patient Account Number: [id number] Date of Birth/Sex: Aug 22, 1925 (85 y.o. F) Treating RN: Cornell Barman Primary Care Jo Cerone: Deborra Medina Other Clinician: Referring Terril Chestnut: Deborra Medina Treating Maurion Walkowiak/Extender: Tito Dine in Treatment: 6 Encounter Discharge  Information Items Post Procedure Vitals Discharge Condition: Stable Temperature (F): 98.3 Ambulatory Status: Ambulatory Pulse (bpm): 79 Discharge Destination: Home Respiratory Rate (breaths/min): 18 Transportation: Private Auto Blood Pressure (mmHg): 192/60 Accompanied By: self Schedule Follow-up Appointment: Yes Clinical Summary of Care: Electronic Signature(s) Signed: 03/28/2019 4:38:11 PM By: Gretta Cool, BSN, RN, CWS, Kim RN, BSN Entered By: Gretta Cool, BSN, RN, CWS, Kim on 03/26/2019 15:15:43 Deanna White ([id number]) -------------------------------------------------------------------------------- Lower Extremity Assessment Details Patient Name: Deanna White Date of Service: 03/26/2019 2:45 PM Medical Record Number: [id number] Patient Account Number: [id number] Date of Birth/Sex: January 12, 1925 (85 y.o. F) Treating RN: Montey Hora Primary Care Olin Gurski: Deborra Medina Other Clinician: Referring Nolin Grell: Deborra Medina Treating Ingvald Theisen/Extender: Ricard Dillon Weeks in Treatment: 6 Edema Assessment Assessed: [Left: No] [Right: No] Edema: [Left: N] [Right: o] Vascular Assessment Pulses: Dorsalis Pedis Palpable: [Left:Yes] Electronic Signature(s) Signed: 03/27/2019 5:10:09 PM By: Montey Hora Entered By: Montey Hora on 03/26/2019 14:55:02 Deanna White ([id number]) -------------------------------------------------------------------------------- Multi Wound Chart Details Patient Name: Deanna White Date of Service: 03/26/2019 2:45 PM Medical Record Number: [id number] Patient Account Number: [id number] Date of Birth/Sex: 25-Nov-1924 (85 y.o. 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 Ulcer N/A Comorbid History: Cataracts, Hypertension Cataracts, Hypertension N/A Date Acquired: 10/02/2018 09/30/2018 N/A Weeks of Treatment: 6 4 N/A Wound Status: Open Open N/A Measurements L x W x D 0.8x0.5x0.1 0.1x0.1x0.1 N/A (cm) Area (cm) : 0.314 0.008 N/A Volume (cm) : 0.031 0.001 N/A % Reduction in Area: 11.00% 0.00% N/A % Reduction in  Volume: 11.40% 0.00% N/A Classification: Full Thickness Without Unclassifiable N/A Exposed Support Structures Exudate Amount: Medium None Present N/A Exudate Type: Serous N/A N/A Exudate Color: amber N/A N/A Wound Margin: Flat and Intact Indistinct, nonvisible N/A Granulation Amount: Small (1-33%) None Present (0%) N/A Granulation Quality: Pink N/A N/A Necrotic Amount: Large (67-100%) None Present (0%) N/A Exposed Structures: Fat Layer (Subcutaneous Fascia: No N/A Tissue) Exposed: Yes Fat Layer (Subcutaneous Fascia: No Tissue) Exposed: No Tendon: No Tendon: No Muscle: No Muscle: No Joint: No Deanna White, Deanna White ([id number]) Joint: No Bone: No Bone: No Limited to Skin Breakdown Epithelialization: None Large (67-100%) N/A Debridement: Debridement - Excisional N/A N/A Pre-procedure 15:03 N/A N/A Verification/Time Out Taken: Pain Control: Lidocaine N/A N/A Tissue Debrided: Subcutaneous, Slough N/A N/A Level: Skin/Subcutaneous Tissue N/A N/A Debridement Area (sq cm): 0.4 N/A N/A Instrument: Curette N/A N/A Bleeding: Minimum N/A N/A Hemostasis Achieved: Pressure N/A N/A Debridement Treatment Procedure was tolerated well N/A N/A Response: Post Debridement 0.8x0.5x0.1 N/A N/A Measurements L x W x D (cm) Post Debridement  Volume: 0.031 N/A N/A (cm) Procedures Performed: Debridement N/A N/A Treatment Notes Electronic Signature(s) Signed: 03/26/2019 4:13:17 PM By: Linton Ham MD Entered By: Linton Ham on 03/26/2019 15:08:38 Deanna White ([id number]) -------------------------------------------------------------------------------- [place] Details Patient Name: Deanna White Date of Service: 03/26/2019 2:45 PM Medical Record Number: [id number] Patient Account Number: [id number] Date of Birth/Sex: 26-Jun-1925 (85 y.o. F) Treating RN: Cornell Barman Primary Care Zali Kamaka: Deborra Medina Other Clinician: Referring Hazeline Charnley: Deborra Medina Treating Vanna Sailer/Extender: Tito Dine in Treatment: 6 Active Inactive Necrotic Tissue Nursing Diagnoses: Impaired tissue integrity related to necrotic/devitalized tissue Knowledge deficit related to management of necrotic/devitalized tissue Goals: Necrotic/devitalized tissue will be minimized in the wound bed Date Initiated: 02/12/2019 Target Resolution Date: 02/19/2019 Goal Status: Active Interventions: Assess patient pain level pre-, during and post procedure and prior to discharge Treatment Activities: Apply topical anesthetic as ordered : 02/12/2019 Excisional debridement : 02/12/2019 Notes: Orientation to the Wound Care Program Nursing Diagnoses: Knowledge deficit related to the wound healing center program Goals: Patient/caregiver will verbalize understanding of the [place] Date Initiated: 02/12/2019 Target Resolution Date: 02/12/2019 Goal Status: Active Interventions: Provide education on orientation to the wound center Notes: Pain, Acute or Chronic Nursing Diagnoses: Pain, acute or chronic: actual or potential Goals: Patient will verbalize adequate pain control and receive pain control interventions during procedures as needed Deanna White, Deanna White ([id number]) Date Initiated: 02/12/2019 Target  Resolution Date: 02/19/2019 Goal Status: Active Interventions: Assess comfort goal upon admission Notes: Wound/Skin Impairment Nursing Diagnoses: Impaired tissue integrity Goals: Patient/caregiver will verbalize understanding of skin care regimen Date Initiated: 02/12/2019 Target Resolution Date: 02/19/2019 Goal Status: Active Ulcer/skin breakdown will have a volume reduction of 30% by week 4 Date Initiated: 02/12/2019 Target Resolution Date: 03/15/2019 Goal Status: Active Interventions: Assess ulceration(s) every visit Notes: Electronic Signature(s) Signed: 03/28/2019 4:38:11 PM By: Gretta Cool, BSN, RN, CWS, Kim RN, BSN Entered By: Gretta Cool, BSN, RN, CWS, Kim on 03/26/2019 15:04:24 Deanna White ([id number]) -------------------------------------------------------------------------------- Pain Assessment Details Patient Name: Deanna White Date of Service: 03/26/2019 2:45 PM Medical Record Number: [id number] Patient Account Number: [id number] Date of Birth/Sex: 1924/10/11 (85 y.o. F) Treating RN: Montey Hora Primary Care Treniece Holsclaw: Deborra Medina Other Clinician: Referring Dinorah Masullo: Deborra Medina Treating Avary Pitsenbarger/Extender: Tito Dine in Treatment: 6 Active Problems Location of Pain Severity and Description of Pain Patient Has Paino Yes Site Locations Pain Location: Pain in Ulcers With Dressing Change: Yes Duration of the Pain.  Constant / Intermittento Constant Pain Management and Medication Current Pain Management: Notes feels like something is in there" Electronic Signature(s) Signed: 03/27/2019 5:10:09 PM By: Montey Hora Entered By: Montey Hora on 03/26/2019 14:47:15 Deanna White ([id number]) -------------------------------------------------------------------------------- Patient/Caregiver Education Details Patient Name: Deanna White Date of Service: 03/26/2019 2:45 PM Medical Record Number: [id number] Patient Account Number: [id number] Date of  Birth/Gender: 04/06/1925 (85 y.o. F) Treating RN: Cornell Barman Primary Care Physician: Deborra Medina Other Clinician: Referring Physician: Deborra Medina Treating Physician/Extender: Tito Dine in Treatment: 6 Education Assessment Education Provided To: Patient Education Topics Provided Wound/Skin Impairment: Handouts: Caring for Your Ulcer Methods: Demonstration, Explain/Verbal Responses: State content correctly Electronic Signature(s) Signed: 03/28/2019 4:38:11 PM By: Gretta Cool, BSN, RN, CWS, Kim RN, BSN Entered By: Gretta Cool, BSN, RN, CWS, Kim on 03/26/2019 15:07:06 Deanna White ([id number]) -------------------------------------------------------------------------------- Wound Assessment Details Patient Name: Deanna White Date of Service: 03/26/2019 2:45 PM Medical Record Number: [id number] Patient Account Number: [id number] Date of Birth/Sex: [birth year] (85 y.o. F) Treating RN: Montey Hora Primary Care Kaden Daughdrill: Deborra Medina Other Clinician: Referring Jedaiah Rathbun: Deborra Medina Treating Taleen Prosser/Extender: Tito Dine in Treatment: 6 Wound Status Wound Number: 1 Primary Etiology: Venous Leg Ulcer Wound Location: Left Lower Leg - Posterior Secondary Etiology: Arterial Insufficiency Ulcer Wounding Event: Gradually Appeared Wound Status: Open Date Acquired: 10/02/2018 Comorbid History: Cataracts, Hypertension Weeks Of Treatment: 6 Clustered Wound: No Photos Wound Measurements Length: (cm) 0.8 % Redu Width: (cm) 0.5 % Redu Depth: (cm) 0.1 Epithe Area: (cm) 0.314 [place] Volume: (cm) 0.031 Under ction in Area: 11% ction in Volume: 11.4% lialization: None ling: No mining: No Wound Description Full Thickness Without Exposed Support Foul O Classification: Structures Slough Wound Margin: Flat and Intact Exudate Medium Amount: Exudate Type: Serous Exudate Color: amber dor After Cleansing: No /Fibrino Yes Wound Bed Granulation Amount: Small  (1-33%) Exposed Structure Granulation Quality: Pink Fascia Exposed: No Necrotic Amount: Large (67-100%) Fat Layer (Subcutaneous Tissue) Exposed: Yes Necrotic Quality: Adherent Slough Tendon Exposed: No Muscle Exposed: No Joint Exposed: No Bone Exposed: No Deanna White, Deanna S. ([id number]) Treatment Notes Wound #1 (Left, Posterior Lower Leg) Notes Prisma, abd, conform, tape Electronic Signature(s) Signed: 03/27/2019 5:10:09 PM By: Montey Hora Entered By: Montey Hora on 03/26/2019 14:54:20 Deanna White ([id number]) -------------------------------------------------------------------------------- Wound Assessment Details Patient Name: Deanna White Date of Service: 03/26/2019 2:45 PM Medical Record Number: [id number] Patient Account Number: [id number] Date of Birth/Sex: [birth year] (85 y.o. F) Treating RN: Montey Hora Primary Care Deyja Sochacki: Deborra Medina Other Clinician: Referring Kyriaki Moder: Deborra Medina Treating Alecxis Baltzell/Extender: Tito Dine in Treatment: 6 Wound Status Wound Number: 2 Primary Etiology: Venous Leg Ulcer Wound Location: Left Malleolus - Lateral Secondary Etiology: Arterial Insufficiency Ulcer Wounding Event: Gradually Appeared Wound Status: Open Date Acquired: 09/30/2018 Comorbid History: Cataracts, Hypertension Weeks Of Treatment: 4 Clustered Wound: No Photos Wound Measurements Length: (cm) 0.1 Width: (cm) 0.1 Depth: (cm) 0.1 Area: (cm) 0.008 Volume: (cm) 0.001 % Reduction in Area: 0% % Reduction in Volume: 0% Epithelialization: Large (67-100%) Tunneling: No Undermining: No Wound Description Classification: Unclassifiable Foul Odor Wound Margin: Indistinct, nonvisible Slough/Fi Exudate Amount: None Present After Cleansing: No brino No Wound Bed Granulation Amount: None Present (0%) Exposed Structure Necrotic Amount: None Present (0%) Fascia Exposed: No Fat Layer (Subcutaneous Tissue) Exposed: No Tendon Exposed:  No Muscle Exposed: No Joint Exposed: No Bone Exposed: No Limited to Skin Breakdown Treatment Notes Wound #2 (Left, Lateral Malleolus) Deanna White, Deanna S. ([id number]) Notes Prisma, abd, conform, tape Electronic Signature(s) Signed:  03/27/2019 5:10:09 PM By: Montey Hora Entered By: Montey Hora on 03/26/2019 14:54:47 Deanna White ([id number]) -------------------------------------------------------------------------------- [place] Details Patient Name: Deanna White Date of Service: 03/26/2019 2:45 PM Medical Record Number: [id number] Patient Account Number: [id number] Date of Birth/Sex: 1925/05/23 (85 y.o. F) Treating RN: Montey Hora Primary Care Jeselle Hiser: Deborra Medina Other Clinician: Referring Zabella Wease: Deborra Medina Treating Tequia Wolman/Extender: Tito Dine in Treatment: 6 Vital Signs Time Taken: 14:47 Temperature (F): 98.3 Height (in): 62 Pulse (bpm): 79 Weight (lbs): 118 Respiratory Rate (breaths/min): 18 Body Mass Index (BMI): 21.6 Blood Pressure (mmHg): 192/60 Reference Range: 80 - 120 mg / dl Electronic Signature(s) Signed: 03/27/2019 5:10:09 PM By: Montey Hora Entered By: Montey Hora on 03/26/2019 14:50:15

## 2019-04-02 ENCOUNTER — Encounter: Payer: Medicare Other | Attending: Internal Medicine | Admitting: Internal Medicine

## 2019-04-02 ENCOUNTER — Other Ambulatory Visit: Payer: Self-pay

## 2019-04-02 DIAGNOSIS — I87332 Chronic venous hypertension (idiopathic) with ulcer and inflammation of left lower extremity: Secondary | ICD-10-CM | POA: Diagnosis not present

## 2019-04-02 DIAGNOSIS — I70242 Atherosclerosis of native arteries of left leg with ulceration of calf: Secondary | ICD-10-CM | POA: Diagnosis not present

## 2019-04-02 DIAGNOSIS — L97222 Non-pressure chronic ulcer of left calf with fat layer exposed: Secondary | ICD-10-CM | POA: Diagnosis not present

## 2019-04-02 NOTE — Progress Notes (Addendum)
Deanna White, Deanna White ([id number]) Visit Report for 04/02/2019 Arrival Information Details Patient Name: Deanna White, Deanna White Date of Service: 04/02/2019 2:00 PM Medical Record Number: [id number] Patient Account Number: [id number] Date of Birth/Sex: January 04, 1925 (85 y.o. F) Treating RN: Army Melia Primary Care Huyen Perazzo: Deborra Medina Other Clinician: Referring Kalin Amrhein: Deborra Medina Treating Berniece Abid/Extender: Tito Dine in Treatment: 7 Visit Information History Since Last Visit Added or deleted any medications: No Patient Arrived: Ambulatory Any new allergies or adverse reactions: No Arrival Time: 14:06 Had a fall or experienced change in No Accompanied By: self activities of daily living that may affect Transfer Assistance: None risk of falls: Patient Has Alerts: Yes Signs or symptoms of abuse/neglect since last visito No Patient Alerts: Patient on Blood Thinner Hospitalized since last visit: No Aspirin 81 mg Has Dressing in Place as Prescribed: Yes ABI (R) 1.01 (L) 1.20 TBI (R) .57 (L) .10 Pain Present Now: No 03/06/2019 Electronic Signature(s) Signed: 04/02/2019 2:18:36 PM By: Army Melia Entered By: Army Melia on 04/02/2019 14:06:17 Deanna White ([id number]) -------------------------------------------------------------------------------- Clinic Level of Care Assessment Details Patient Name: Deanna White Date of Service: 04/02/2019 2:00 PM Medical Record Number: [id number] Patient Account Number: [id number] Date of Birth/Sex: Jun 04, 1925 (85 y.o. F) Treating RN: Cornell Barman Primary Care Phinley Schall: Deborra Medina Other Clinician: Referring Ednamae Schiano: Deborra Medina Treating Paxon Propes/Extender: Tito Dine in Treatment: 7 Clinic Level of Care Assessment Items TOOL 4 Quantity Score []  - Use when only an EandM is performed on FOLLOW-UP visit 0 ASSESSMENTS - Nursing Assessment / Reassessment []  - Reassessment of Co-morbidities (includes updates in patient status)  0 X- 1 5 Reassessment of Adherence to Treatment Plan ASSESSMENTS - Wound and Skin Assessment / Reassessment X - Simple Wound Assessment / Reassessment - one wound 1 5 []  - 0 Complex Wound Assessment / Reassessment - multiple wounds []  - 0 Dermatologic / Skin Assessment (not related to wound area) ASSESSMENTS - Focused Assessment []  - Circumferential Edema Measurements - multi extremities 0 []  - 0 Nutritional Assessment / Counseling / Intervention []  - 0 Lower Extremity Assessment (monofilament, tuning fork, pulses) []  - 0 Peripheral Arterial Disease Assessment (using hand held doppler) ASSESSMENTS - Ostomy and/or Continence Assessment and Care []  - Incontinence Assessment and Management 0 []  - 0 Ostomy Care Assessment and Management (repouching, etc.) PROCESS - Coordination of Care X - Simple Patient / Family Education for ongoing care 1 15 []  - 0 Complex (extensive) Patient / Family Education for ongoing care []  - 0 Staff obtains Programmer, systems, Records, Test Results / Process Orders []  - 0 Staff telephones HHA, Nursing Homes / Clarify orders / etc []  - 0 Routine Transfer to another Facility (non-emergent condition) []  - 0 Routine Hospital Admission (non-emergent condition) []  - 0 New Admissions / Biomedical engineer / Ordering NPWT, Apligraf, etc. []  - 0 Emergency Hospital Admission (emergent condition) X- 1 10 Simple Discharge Coordination Deanna White, REDER. ([id number]) []  - 0 Complex (extensive) Discharge Coordination PROCESS - Special Needs []  - Pediatric / Minor Patient Management 0 []  - 0 Isolation Patient Management []  - 0 Hearing / Language / Visual special needs []  - 0 Assessment of Community assistance (transportation, D/C planning, etc.) []  - 0 Additional assistance / Altered mentation []  - 0 Support Surface(s) Assessment (bed, cushion, seat, etc.) INTERVENTIONS - Wound Cleansing / Measurement X - Simple Wound Cleansing - one wound 1 5 []  - 0 Complex  Wound Cleansing - multiple wounds X- 1 5 Wound Imaging (photographs - any number of  wounds) []  - 0 Wound Tracing (instead of photographs) X- 1 5 Simple Wound Measurement - one wound []  - 0 Complex Wound Measurement - multiple wounds INTERVENTIONS - Wound Dressings []  - Small Wound Dressing one or multiple wounds 0 X- 1 15 Medium Wound Dressing one or multiple wounds []  - 0 Large Wound Dressing one or multiple wounds []  - 0 Application of Medications - topical []  - 0 Application of Medications - injection INTERVENTIONS - Miscellaneous []  - External ear exam 0 []  - 0 Specimen Collection (cultures, biopsies, blood, body fluids, etc.) []  - 0 Specimen(s) / Culture(s) sent or taken to Lab for analysis []  - 0 Patient Transfer (multiple staff / Civil Service fast streamer / Similar devices) []  - 0 Simple Staple / Suture removal (25 or less) []  - 0 Complex Staple / Suture removal (26 or more) []  - 0 Hypo / Hyperglycemic Management (close monitor of Blood Glucose) []  - 0 Ankle / Brachial Index (ABI) - do not check if billed separately X- 1 5 Vital Signs Deanna White, Deanna S. ([id number]) Has the patient been seen at the hospital within the last three years: Yes Total Score: 70 Level Of Care: New/Established - Level 2 Electronic Signature(s) Signed: 04/07/2019 5:43:16 PM By: Gretta Cool, BSN, RN, CWS, Kim RN, BSN Entered By: Gretta Cool, BSN, RN, CWS, Kim on 04/02/2019 14:38:37 Deanna White ([id number]) -------------------------------------------------------------------------------- Encounter Discharge Information Details Patient Name: Deanna White Date of Service: 04/02/2019 2:00 PM Medical Record Number: [id number] Patient Account Number: [id number] Date of Birth/Sex: 1925/01/16 (85 y.o. F) Treating RN: Army Melia Primary Care Aisea Bouldin: Deborra Medina Other Clinician: Referring Chesley Valls: Deborra Medina Treating Dequante Tremaine/Extender: Tito Dine in Treatment: 7 Encounter Discharge Information  Items Post Procedure Vitals Discharge Condition: Stable Temperature (F): 98.2 Ambulatory Status: Ambulatory Pulse (bpm): 65 Discharge Destination: Home Respiratory Rate (breaths/min): 16 Transportation: Private Auto Blood Pressure (mmHg): 162/44 Accompanied By: self Schedule Follow-up Appointment: Yes Clinical Summary of Care: Electronic Signature(s) Signed: 04/07/2019 4:22:26 PM By: Army Melia Entered By: Army Melia on 04/02/2019 14:51:00 Deanna White ([id number]) -------------------------------------------------------------------------------- Lower Extremity Assessment Details Patient Name: Deanna White Date of Service: 04/02/2019 2:00 PM Medical Record Number: [id number] Patient Account Number: [id number] Date of Birth/Sex: 02/15/1925 (85 y.o. F) Treating RN: Army Melia Primary Care Antwain Caliendo: Deborra Medina Other Clinician: Referring Kaylianna Detert: Deborra Medina Treating Harrel Ferrone/Extender: Ricard Dillon Weeks in Treatment: 7 Edema Assessment Assessed: [Left: No] [Right: No] Edema: [Left: N] [Right: o] Vascular Assessment Pulses: Dorsalis Pedis Palpable: [Left:Yes] Electronic Signature(s) Signed: 04/02/2019 2:18:36 PM By: Army Melia Entered By: Army Melia on 04/02/2019 14:13:26 Deanna White ([id number]) -------------------------------------------------------------------------------- Multi Wound Chart Details Patient Name: Deanna White Date of Service: 04/02/2019 2:00 PM Medical Record Number: [id number] Patient Account Number: [id number] Date of Birth/Sex: Dec 24, 1924 (85 y.o. 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 N/A Comorbid History: Cataracts, Hypertension Cataracts, Hypertension N/A Date Acquired: 10/02/2018 09/30/2018 N/A Weeks of Treatment: 7 5 N/A Wound Status: Open Healed - Epithelialized N/A Measurements L x W x D 0.9x0.4x0.1 0x0x0 N/A (cm) Area (cm) :  0.283 0 N/A Volume (cm) : 0.028 0 N/A % Reduction in Area: 19.80% 100.00% N/A % Reduction in Volume: 20.00% 100.00% N/A Classification: Full Thickness Without Unclassifiable N/A Exposed Support Structures Exudate Amount: Medium None Present N/A Exudate Type: Serous N/A N/A Exudate Color: amber N/A N/A Wound Margin: Flat and Intact Indistinct, nonvisible N/A Granulation Amount: Medium (34-66%) None Present (0%) N/A Granulation Quality: Pink N/A N/A Necrotic Amount: Medium (34-66%) None Present (0%) N/A Exposed Structures: Fat Layer (Subcutaneous Fascia: No N/A Tissue) Exposed: Yes Fat Layer (Subcutaneous Fascia: No Tissue) Exposed: No Tendon: No Tendon: No Muscle: No Muscle: No Joint: No Deanna White, Deanna White ([id number]) Joint: No Bone: No Bone: No Limited to Skin Breakdown Epithelialization: None Large (67-100%) N/A Debridement: Debridement - Excisional N/A N/A Pre-procedure 14:33 N/A N/A Verification/Time Out Taken: Pain Control: Lidocaine N/A N/A Tissue Debrided: Subcutaneous, Slough N/A N/A Level: Skin/Subcutaneous Tissue N/A N/A Debridement Area (sq cm): 0.36 N/A N/A Instrument: Curette N/A N/A Bleeding: Minimum N/A N/A Hemostasis Achieved: Pressure N/A N/A Debridement Treatment Procedure was tolerated well N/A N/A Response: Post Debridement 0.9x0.5x0.1 N/A N/A Measurements L x W x D (cm) Post Debridement Volume: 0.035 N/A N/A (cm) Procedures  Performed: Debridement N/A N/A Treatment Notes Wound #1 (Left, Posterior Lower Leg) Notes Prisma, conform, tape Electronic Signature(s) Signed: 04/02/2019 6:06:19 PM By: Linton Ham MD Entered By: Linton Ham on 04/02/2019 18:02:19 Deanna White ([id number]) -------------------------------------------------------------------------------- Multi-Disciplinary Care Plan Details Patient Name: Deanna White Date of Service: 04/02/2019 2:00 PM Medical Record Number: [id number] Patient Account Number: [id number] Date of Birth/Sex: [birth year] (85 y.o. F) Treating RN: Cornell Barman Primary Care Meika Earll: Deborra Medina Other Clinician: Referring Katalia Choma: Deborra Medina Treating Marquail Bradwell/Extender: Tito Dine in Treatment: 7 Active Inactive Necrotic Tissue Nursing Diagnoses: Impaired tissue integrity related to necrotic/devitalized tissue Knowledge deficit related to management of necrotic/devitalized tissue Goals: Necrotic/devitalized tissue will be minimized in the wound bed Date Initiated: 02/12/2019 Target Resolution Date: 02/19/2019 Goal Status: Active Interventions: Assess patient pain level pre-, during and post procedure and prior to discharge Treatment Activities: Apply topical anesthetic as ordered : 02/12/2019 Excisional debridement : 02/12/2019 Notes: Orientation to the Wound Care Program Nursing Diagnoses: Knowledge deficit related to the wound healing center program Goals: Patient/caregiver will verbalize understanding of the [place] Date Initiated: 02/12/2019 Target Resolution Date: 02/12/2019 Goal Status: Active Interventions: Provide education on orientation to the wound center Notes: Pain, Acute or Chronic Nursing Diagnoses: Pain, acute or chronic: actual or potential Goals: Patient will verbalize adequate pain control and receive pain control interventions during procedures as needed Deanna White, Deanna White ([id number]) Date Initiated:  02/12/2019 Target Resolution Date: 02/19/2019 Goal Status: Active Interventions: Assess comfort goal upon admission Notes: Wound/Skin Impairment Nursing Diagnoses: Impaired tissue integrity Goals: Patient/caregiver will verbalize understanding of skin care regimen Date Initiated: 02/12/2019 Target Resolution Date: 02/19/2019 Goal Status: Active Ulcer/skin breakdown will have a volume reduction of 30% by week 4 Date Initiated: 02/12/2019 Target Resolution Date: 03/15/2019 Goal Status: Active Interventions: Assess ulceration(s) every visit Notes: Electronic Signature(s) Signed: 04/07/2019 5:43:16 PM By: Gretta Cool, BSN, RN, CWS, Kim RN, BSN Entered By: Gretta Cool, BSN, RN, CWS, Kim on 04/02/2019 14:31:55 Deanna White ([id number]) -------------------------------------------------------------------------------- Pain Assessment Details Patient Name: Deanna White Date of Service: 04/02/2019 2:00 PM Medical Record Number: [id number] Patient Account Number: [id number] Date of Birth/Sex: 09/07/1925 (85 y.o. F) Treating RN: Army Melia Primary Care Brenon Antosh: Deborra Medina Other Clinician: Referring Shanieka Blea: Deborra Medina Treating Trecia Maring/Extender: Tito Dine in Treatment: 7  Active Problems Location of Pain Severity and Description of Pain Patient Has Paino No Site Locations Pain Management and Medication Current Pain Management: Electronic Signature(s) Signed: 04/02/2019 2:18:36 PM By: Army Melia Entered By: Army Melia on 04/02/2019 14:06:25 Deanna White ([id number]) -------------------------------------------------------------------------------- Patient/Caregiver Education Details Patient Name: Deanna White Date of Service: 04/02/2019 2:00 PM Medical Record Number: [id number] Patient Account Number: [id number] Date of Birth/Gender: August 22, 1925 (85 y.o. F) Treating RN: Cornell Barman Primary Care Physician: Deborra Medina Other Clinician: Referring Physician: Deborra Medina Treating Physician/Extender: Tito Dine in Treatment: 7 Education Assessment Education Provided To: Patient Education Topics Provided Welcome To [facility]: Handouts: Welcome To [facility] Methods: Demonstration, Explain/Verbal Responses: State content correctly Wound/Skin Impairment: Handouts: Caring for Your Ulcer Methods: Demonstration, Explain/Verbal Responses: State content correctly Electronic Signature(s) Signed: 04/07/2019 5:43:16 PM By: Gretta Cool, BSN, RN, CWS, Kim RN, BSN Entered By: Gretta Cool, BSN, RN, CWS, Kim on 04/02/2019 14:39:13 Deanna White ([id number]) -------------------------------------------------------------------------------- Wound Assessment Details Patient Name: Deanna White Date of Service: 04/02/2019 2:00 PM Medical Record Number: [id number] Patient Account Number: [id number] Date of Birth/Sex: May 23, 1925 (85 y.o. F) Treating RN: Army Melia Primary Care Jennell Janosik: Deborra Medina Other Clinician: Referring Jeri Rawlins: Deborra Medina Treating Weldon Nouri/Extender: Tito Dine in Treatment: 7 Wound Status Wound Number: 1 Primary Etiology: Venous Leg Ulcer Wound Location: Left Lower Leg - Posterior Secondary Etiology: Arterial Insufficiency Ulcer Wounding Event: Gradually Appeared Wound Status: Open Date Acquired: 10/02/2018 Comorbid History: Cataracts, Hypertension Weeks Of Treatment: 7 Clustered Wound: No Photos Wound Measurements Length: (cm) 0.9 % Redu Width: (cm) 0.4 % Redu Depth: (cm) 0.1 Epithe Area: (cm) 0.283 [place] Volume: (cm) 0.028 Under ction in Area: 19.8% ction in Volume: 20% lialization: None ling: No mining: No Wound Description Full Thickness Without Exposed Support Foul O Classification: Structures Slough Wound Margin: Flat and Intact Exudate Medium Amount: Exudate Type: Serous Exudate Color: amber dor After Cleansing: No /Fibrino Yes Wound Bed Granulation Amount:  Medium (34-66%) Exposed Structure Granulation Quality: Pink Fascia Exposed: No Necrotic Amount: Medium (34-66%) Fat Layer (Subcutaneous Tissue) Exposed: Yes Necrotic Quality: Adherent Slough Tendon Exposed: No Muscle Exposed: No Joint Exposed: No Bone Exposed: No Deanna White, Deanna S. ([id number]) Treatment Notes Wound #1 (Left, Posterior Lower Leg) Notes Prisma, conform, tape Electronic Signature(s) Signed: 04/02/2019 2:18:36 PM By: Army Melia Entered By: Army Melia on 04/02/2019 14:12:34 Deanna White ([id number]) -------------------------------------------------------------------------------- Wound Assessment Details Patient Name: Deanna White Date of Service: 04/02/2019 2:00 PM Medical Record Number: [id number] Patient Account Number: [id number] Date of Birth/Sex: 1925/06/06 (85 y.o. F) Treating RN: Cornell Barman Primary Care Ashaunti Treptow: Deborra Medina Other Clinician: Referring Magdalene Tardiff: Deborra Medina Treating Kellie Murrill/Extender: Tito Dine in Treatment: 7 Wound Status Wound Number: 2 Primary Etiology: Venous Leg Ulcer Wound Location: Left, Lateral Malleolus Secondary Etiology: Arterial Insufficiency Ulcer Wounding Event: Gradually Appeared Wound Status: Healed - Epithelialized Date Acquired: 09/30/2018 Comorbid History: Cataracts, Hypertension Weeks Of Treatment: 5 Clustered Wound: No Photos Wound Measurements Length: (cm) 0 % Red Width: (cm) 0 % Red Depth: (cm) 0 Epith Area: (cm) 0 Tunn Volume: (cm) 0 Unde uction in Area: 100% uction in Volume: 100% elialization: Large (67-100%) eling: No rmining: No Wound Description Classification: Unclassifiable Foul Wound Margin: Indistinct, nonvisible Sloug Exudate Amount: None Present Odor After Cleansing: No h/Fibrino No Wound Bed Granulation Amount: None Present (0%) Exposed Structure Necrotic Amount: None Present (0%) Fascia Exposed: No Fat Layer (Subcutaneous Tissue) Exposed: No Tendon Exposed:  No Muscle Exposed:  No Joint Exposed: No Bone Exposed: No Limited to Skin Breakdown Electronic Signature(s) Signed: 04/07/2019 5:43:16 PM By: Gretta Cool, BSN, RN, CWS, Kim RN, BSN Previous Signature: 04/02/2019 2:18:36 PM Version By: Shari Heritage ([id number]) Entered By: Gretta Cool BSN, RN, CWS, Kim on 04/02/2019 14:36:34 Deanna White ([id number]) -------------------------------------------------------------------------------- [place] Details Patient Name: Deanna White Date of Service: 04/02/2019 2:00 PM Medical Record Number: [id number] Patient Account Number: [id number] Date of Birth/Sex: September 21, 1925 (85 y.o. F) Treating RN: Army Melia Primary Care Gianna Calef: Deborra Medina Other Clinician: Referring Monti Jilek: Deborra Medina Treating Redina Zeller/Extender: Tito Dine in Treatment: 7 Vital Signs Time Taken: 14:10 Temperature (F): 98.2 Height (in): 62 Pulse (bpm): 65 Weight (lbs): 118 Respiratory Rate (breaths/min): 16 Body Mass Index (BMI): 21.6 Blood Pressure (mmHg): 162/44 Reference Range: 80 - 120 mg / dl Electronic Signature(s) Signed: 04/02/2019 2:18:36 PM By: Army Melia Entered By: Army Melia on 04/02/2019 14:11:03

## 2019-04-07 ENCOUNTER — Other Ambulatory Visit (INDEPENDENT_AMBULATORY_CARE_PROVIDER_SITE_OTHER): Payer: Self-pay | Admitting: Vascular Surgery

## 2019-04-07 DIAGNOSIS — I70249 Atherosclerosis of native arteries of left leg with ulceration of unspecified site: Secondary | ICD-10-CM

## 2019-04-07 DIAGNOSIS — Z9862 Peripheral vascular angioplasty status: Secondary | ICD-10-CM

## 2019-04-09 ENCOUNTER — Encounter: Payer: Medicare Other | Admitting: Internal Medicine

## 2019-04-09 ENCOUNTER — Other Ambulatory Visit: Payer: Self-pay

## 2019-04-09 DIAGNOSIS — I87332 Chronic venous hypertension (idiopathic) with ulcer and inflammation of left lower extremity: Secondary | ICD-10-CM | POA: Diagnosis not present

## 2019-04-10 ENCOUNTER — Ambulatory Visit (INDEPENDENT_AMBULATORY_CARE_PROVIDER_SITE_OTHER): Payer: Medicare Other

## 2019-04-10 ENCOUNTER — Encounter (INDEPENDENT_AMBULATORY_CARE_PROVIDER_SITE_OTHER): Payer: Self-pay | Admitting: Vascular Surgery

## 2019-04-10 ENCOUNTER — Ambulatory Visit (INDEPENDENT_AMBULATORY_CARE_PROVIDER_SITE_OTHER): Payer: Medicare Other | Admitting: Vascular Surgery

## 2019-04-10 VITALS — BP 139/67 | HR 77 | Resp 17 | Ht 62.0 in | Wt 115.0 lb

## 2019-04-10 DIAGNOSIS — I480 Paroxysmal atrial fibrillation: Secondary | ICD-10-CM | POA: Diagnosis not present

## 2019-04-10 DIAGNOSIS — Z7902 Long term (current) use of antithrombotics/antiplatelets: Secondary | ICD-10-CM

## 2019-04-10 DIAGNOSIS — I872 Venous insufficiency (chronic) (peripheral): Secondary | ICD-10-CM | POA: Diagnosis not present

## 2019-04-10 DIAGNOSIS — E782 Mixed hyperlipidemia: Secondary | ICD-10-CM

## 2019-04-10 DIAGNOSIS — I70249 Atherosclerosis of native arteries of left leg with ulceration of unspecified site: Secondary | ICD-10-CM | POA: Diagnosis not present

## 2019-04-10 DIAGNOSIS — Z9862 Peripheral vascular angioplasty status: Secondary | ICD-10-CM

## 2019-04-10 DIAGNOSIS — I10 Essential (primary) hypertension: Secondary | ICD-10-CM | POA: Diagnosis not present

## 2019-04-10 DIAGNOSIS — I7025 Atherosclerosis of native arteries of other extremities with ulceration: Secondary | ICD-10-CM

## 2019-04-10 DIAGNOSIS — Z79899 Other long term (current) drug therapy: Secondary | ICD-10-CM

## 2019-04-10 NOTE — Progress Notes (Signed)
KRISTYN, OBYRNE ([id number]) Visit Report for 04/09/2019 Debridement Details Patient Name: Deanna White, Deanna White Date of Service: 04/09/2019 2:00 PM Medical Record Number: [id number] Patient Account Number: [id number] Date of Birth/Sex: 31-Oct-1924 (85 y.o. F) Treating RN: Cornell Barman Primary Care Provider: Deborra Medina Other Clinician: Referring Provider: Deborra Medina Treating Provider/Extender: Tito Dine in Treatment: 8 Debridement Performed for Wound #1 Left,Posterior Lower Leg Assessment: Performed By: Physician Ricard Dillon, MD Debridement Type: Debridement Severity of Tissue Pre Fat layer exposed Debridement: Level of Consciousness (Pre- Awake and Alert procedure): Pre-procedure Verification/Time Yes - 14:24 Out Taken: Start Time: 14:24 Pain Control: Lidocaine Total Area Debrided (L x W): 0.7 (cm) x 0.5 (cm) = 0.35 (cm) Tissue and other material Viable, Non-Viable, Slough, Subcutaneous, Slough debrided: Level: Skin/Subcutaneous Tissue Debridement Description: Excisional Instrument: Curette Bleeding: Minimum Hemostasis Achieved: Pressure End Time: 14:25 Response to Treatment: Procedure was tolerated well Level of Consciousness Awake and Alert (Post-procedure): Post Debridement Measurements of Total Wound Length: (cm) 0.7 Width: (cm) 0.5 Depth: (cm) 0.2 Volume: (cm) 0.055 Character of Wound/Ulcer Post Debridement: Stable Severity of Tissue Post Debridement: Fat layer exposed Post Procedure Diagnosis Same as Pre-procedure Electronic Signature(s) Signed: 04/09/2019 5:45:20 PM By: Linton Ham MD Signed: 04/09/2019 5:54:22 PM By: Gretta Cool, BSN, RN, CWS, Kim RN, BSN Entered By: Linton Ham on 04/09/2019 17:38:54 Deanna White ([id number]) -------------------------------------------------------------------------------- HPI Details Patient Name: Deanna White Date of Service: 04/09/2019 2:00 PM Medical Record Number: [id number] Patient Account  Number: [id number] Date of Birth/Sex: 04/01/1925 (85 y.o. F) Treating RN: Cornell Barman Primary Care Provider: Deborra Medina Other Clinician: Referring Provider: Deborra Medina Treating Provider/Extender: Tito Dine in Treatment: 8 History of Present Illness HPI Description: 02/12/2019 Admission This is a very functional 85 year old woman who drove herself to our clinic today. She is concerned about a wound on her left posterior calf. This apparently started 4 to 6 weeks ago at which time she developed increasing swelling and redness in her lower leg. She saw her primary doctor in April who thought this was a venous insufficiency ulcer with some degree of good venous stasis. She did receive antibiotics as well as triamcinolone cream for the inflammation. The patient complains of a lot of pain both in the wound and also in the lateral part of her ankle which is especially worse at night and relieved by standing or walking. She is a nondiabetic and non-smoker. The patient had a venous Doppler on 10/09/2018 that was negative for a DVT and no reflux noted in the greater saphenous vein. No comment on superficial vein thrombosis. Past medical history includes chronic kidney disease stage III, hypothyroidism, glaucoma, history of venous ulcer, hypertension ABIs in our clinic were not obtainable on the left even with the Doppler. 0.56 on the right 5/20-Patient returns to clinic after 1 week, she was started on Prisma with every other day dressing changes, she has noticed pain with leg up, but denies pain with ambulation. Dopplers are scheduled for June 4 and ABIs are not obtained in clinic 5/27 still using Prisma. Arterial studies are booked for June 4. We are still not using compression due to the complete absence of peripheral pulses in the right foot and a very low ABI. In passing the patient mentioned a very painful area on her lateral right ankle just posterior to the malleolus. She has a  very small nodule. Very tender. I thought this might have a core and I removed some eschar on the top of this but  there was nothing to drain. This does not look as though it should be so tender. There was no purulence nothing I thought I could culture. I wondered whether this might be a dermatofibroma however I do not think these are painful. As mentioned I saw no evidence of infection in this use. 6/3; patient is still using Prisma. We do not have a viable wound surface on her original wound area. This continues to require debridement. The small nodule on the right ankle had open this week I am not sure what this is however it is exquisitely painful. 6/10; ARTERIAL studies were done on 6/4. This showed an ABI in the right of 1.01 and a TBI of 0.57. On the left her ABI at the anterior tibial was 1.20. However her TBI was 0.10. Monophasic waveforms mostly bilaterally. She went on to see Dr. Delana Meyer the same day. The patient is undergoing angiography of the left lower extremity with the hope of intervention. He also does felt she had venous insufficiency of both lower extremities. We are using silver collagen to the wounds 6/17. The patient had her angiogram on 03/11/2019. She had angioplasty of the left anterior tibial artery. Angioplasty of the SFA and popliteal arteries. We have been using silver collagen to her 2 wound areas 6/24; the patient has 1 small remaining wound on the left posterior calf. We are using silver collagen. She has the small nodule on the right lateral ankle. This is healed over. When she first came here this had eschar in the top that I removed and it is now closed over but still extremely tender and somewhat inflamed. She has chronic venous insufficiency, this does not appear to be vascular. 7/1; small wound still remaining. We have been using silver collagen. She continues to complain of the nodule on the left lateral ankle as a source of pain. X-ray we did of the ankle  showed a dorsal calcaneal spur but I do not think that is in this area. There was no underlying acute bony or joint abnormality 7/8; small wound still remaining we have been using silver collagen. This is perhaps slightly smaller very refractory. She continues to complain of a lot of pain in her left ankle and has a nodule that she seems to localize this to. This seems Deanna White, Deanna White. ([id number]) slightly larger. The degree of pain she complains about including keeping her awake at night severe pain with palpation seems out of proportion to what we see. In fact when she first came in I thought this was all claudication which is 1 of the reasons we had her revascularized. I am not sure that this really helped the discomfort. It is difficult to tell but I do not think she has active arthritis in the ankle or active synovitis. We did an x-ray that was negative. She sees her dermatologist Dr. Nehemiah Massed next Thursday Electronic Signature(s) Signed: 04/09/2019 5:45:20 PM By: Linton Ham MD Entered By: Linton Ham on 04/09/2019 17:40:44 Deanna White ([id number]) -------------------------------------------------------------------------------- Physical Exam Details Patient Name: Deanna White Date of Service: 04/09/2019 2:00 PM Medical Record Number: [id number] Patient Account Number: [id number] Date of Birth/Sex: Jun 12, 1925 (85 y.o. F) Treating RN: Cornell Barman Primary Care Provider: Deborra Medina Other Clinician: Referring Provider: Deborra Medina Treating Provider/Extender: Tito Dine in Treatment: 8 Respiratory Respiratory effort is easy and symmetric bilaterally. Rate is normal at rest and on room air.. Cardiovascular Pedal pulses in the left foot are palpable but certainly not robust. Musculoskeletal  The left ankle has good range of motion. I cannot really reproduce severe pain with palpation of the joint margin. There is not a major effusion. Integumentary (Hair,  Skin) Enlarging nodule on the left ankle on the lateral aspect. I am not sure what this is and why it so uncomfortable for her. I had some thought about a neuroma but it seems too superficial for that. Psychiatric No evidence of depression, anxiety, or agitation. Calm, cooperative, and communicative. Appropriate interactions and affect.. Notes Wound exam; posterior calf wound again debrided of adherent necrotic debris with a #3 curette. I am still able to get to a healthy surface although this is refractory and closing. Dimensions however are measuring smaller. oThe small tender area in the lateral ankle seems to be actually larger. Very tender. Electronic Signature(s) Signed: 04/09/2019 5:45:20 PM By: Linton Ham MD Entered By: Linton Ham on 04/09/2019 17:42:39 Deanna White ([id number]) -------------------------------------------------------------------------------- Physician Orders Details Patient Name: Deanna White Date of Service: 04/09/2019 2:00 PM Medical Record Number: [id number] Patient Account Number: [id number] Date of Birth/Sex: [birth year] (85 y.o. F) Treating RN: Cornell Barman Primary Care Provider: Deborra Medina Other Clinician: Referring Provider: Deborra Medina Treating Provider/Extender: Tito Dine in Treatment: 8 Verbal / Phone Orders: No Diagnosis Coding Wound Cleansing Wound #1 Left,Posterior Lower Leg o Clean wound with Normal Saline. o May Shower, gently pat wound dry prior to applying new dressing. Anesthetic (add to Medication List) Wound #1 Left,Posterior Lower Leg o Topical Lidocaine 4% cream applied to wound bed prior to debridement (In Clinic Only). Primary Wound Dressing Wound #1 Left,Posterior Lower Leg o Silver Collagen Secondary Dressing Wound #1 Left,Posterior Lower Leg o Conform/Kerlix - Lightly to secure collagen in place. Dressing Change Frequency Wound #1 Left,Posterior Lower Leg o Change Dressing Monday,  Wednesday, Friday Follow-up Appointments Wound #1 Left,Posterior Lower Leg o Return Appointment in 1 week. Edema Control Wound #1 Left,Posterior Lower Leg o Elevate legs to the level of the heart and pump ankles as often as possible Electronic Signature(s) Signed: 04/09/2019 5:45:20 PM By: Linton Ham MD Signed: 04/09/2019 5:54:22 PM By: Gretta Cool, BSN, RN, CWS, Kim RN, BSN Entered By: Gretta Cool, BSN, RN, CWS, Kim on 04/09/2019 14:27:32 Deanna White, Deanna White ([id number]) -------------------------------------------------------------------------------- Problem List Details Patient Name: Deanna White Date of Service: 04/09/2019 2:00 PM Medical Record Number: [id number] Patient Account Number: [id number] Date of Birth/Sex: [birth year] (85 y.o. F) Treating RN: Cornell Barman Primary Care Provider: Deborra Medina Other Clinician: Referring Provider: Deborra Medina Treating Provider/Extender: Tito Dine in Treatment: 8 Active Problems ICD-10 Evaluated Encounter Code Description Active Date Today Diagnosis L97.221 Non-pressure chronic ulcer of left calf limited to breakdown of 02/12/2019 No Yes skin I87.332 Chronic venous hypertension (idiopathic) with ulcer and 02/12/2019 No Yes inflammation of left lower extremity I70.242 Atherosclerosis of native arteries of left leg with ulceration of 02/12/2019 No Yes calf Inactive Problems Resolved Problems Electronic Signature(s) Signed: 04/09/2019 5:45:20 PM By: Linton Ham MD Entered By: Linton Ham on 04/09/2019 17:37:55 Deanna White ([id number]) -------------------------------------------------------------------------------- Progress Note Details Patient Name: Deanna White Date of Service: 04/09/2019 2:00 PM Medical Record Number: [id number] Patient Account Number: [id number] Date of Birth/Sex: 17-Dec-1924 (85 y.o. F) Treating RN: Cornell Barman Primary Care Provider: Deborra Medina Other Clinician: Referring Provider: Deborra Medina Treating Provider/Extender: Tito Dine in Treatment: 8 Subjective History of Present Illness (HPI) 02/12/2019 Admission This is a very functional 85 year old woman who drove herself to our clinic today. She is concerned about a  wound on her left posterior calf. This apparently started 4 to 6 weeks ago at which time she developed increasing swelling and redness in her lower leg. She saw her primary doctor in April who thought this was a venous insufficiency ulcer with some degree of good venous stasis. She did receive antibiotics as well as triamcinolone cream for the inflammation. The patient complains of a lot of pain both in the wound and also in the lateral part of her ankle which is especially worse at night and relieved by standing or walking. She is a nondiabetic and non-smoker. The patient had a venous Doppler on 10/09/2018 that was negative for a DVT and no reflux noted in the greater saphenous vein. No comment on superficial vein thrombosis. Past medical history includes chronic kidney disease stage III, hypothyroidism, glaucoma, history of venous ulcer, hypertension ABIs in our clinic were not obtainable on the left even with the Doppler. 0.56 on the right 5/20-Patient returns to clinic after 1 week, she was started on Prisma with every other day dressing changes, she has noticed pain with leg up, but denies pain with ambulation. Dopplers are scheduled for June 4 and ABIs are not obtained in clinic 5/27 still using Prisma. Arterial studies are booked for June 4. We are still not using compression due to the complete absence of peripheral pulses in the right foot and a very low ABI. In passing the patient mentioned a very painful area on her lateral right ankle just posterior to the malleolus. She has a very small nodule. Very tender. I thought this might have a core and I removed some eschar on the top of this but there was nothing to drain. This does not look as  though it should be so tender. There was no purulence nothing I thought I could culture. I wondered whether this might be a dermatofibroma however I do not think these are painful. As mentioned I saw no evidence of infection in this use. 6/3; patient is still using Prisma. We do not have a viable wound surface on her original wound area. This continues to require debridement. The small nodule on the right ankle had open this week I am not sure what this is however it is exquisitely painful. 6/10; ARTERIAL studies were done on 6/4. This showed an ABI in the right of 1.01 and a TBI of 0.57. On the left her ABI at the anterior tibial was 1.20. However her TBI was 0.10. Monophasic waveforms mostly bilaterally. She went on to see Dr. Delana Meyer the same day. The patient is undergoing angiography of the left lower extremity with the hope of intervention. He also does felt she had venous insufficiency of both lower extremities. We are using silver collagen to the wounds 6/17. The patient had her angiogram on 03/11/2019. She had angioplasty of the left anterior tibial artery. Angioplasty of the SFA and popliteal arteries. We have been using silver collagen to her 2 wound areas 6/24; the patient has 1 small remaining wound on the left posterior calf. We are using silver collagen. She has the small nodule on the right lateral ankle. This is healed over. When she first came here this had eschar in the top that I removed and it is now closed over but still extremely tender and somewhat inflamed. She has chronic venous insufficiency, this does not appear to be vascular. 7/1; small wound still remaining. We have been using silver collagen. She continues to complain of the nodule on the left lateral  ankle as a source of pain. X-ray we did of the ankle showed a dorsal calcaneal spur but I do not think that is in this area. There was no underlying acute bony or joint abnormality 7/8; small wound still remaining we have  been using silver collagen. This is perhaps slightly smaller very refractory. Deanna White, Deanna White ([id number]) She continues to complain of a lot of pain in her left ankle and has a nodule that she seems to localize this to. This seems slightly larger. The degree of pain she complains about including keeping her awake at night severe pain with palpation seems out of proportion to what we see. In fact when she first came in I thought this was all claudication which is 1 of the reasons we had her revascularized. I am not sure that this really helped the discomfort. It is difficult to tell but I do not think she has active arthritis in the ankle or active synovitis. We did an x-ray that was negative. She sees her dermatologist Dr. Nehemiah Massed next Thursday Objective Constitutional Vitals Time Taken: 2:05 PM, Height: 62 in, Weight: 118 lbs, BMI: 21.6, Temperature: 98.3 F, Pulse: 65 bpm, Respiratory Rate: 16 breaths/min, Blood Pressure: 191/64 mmHg. Respiratory Respiratory effort is easy and symmetric bilaterally. Rate is normal at rest and on room air.. Cardiovascular Pedal pulses in the left foot are palpable but certainly not robust. Musculoskeletal The left ankle has good range of motion. I cannot really reproduce severe pain with palpation of the joint margin. There is not a major effusion. Psychiatric No evidence of depression, anxiety, or agitation. Calm, cooperative, and communicative. Appropriate interactions and affect.. General Notes: Wound exam; posterior calf wound again debrided of adherent necrotic debris with a #3 curette. I am still able to get to a healthy surface although this is refractory and closing. Dimensions however are measuring smaller. The small tender area in the lateral ankle seems to be actually larger. Very tender. Integumentary (Hair, Skin) Enlarging nodule on the left ankle on the lateral aspect. I am not sure what this is and why it so uncomfortable for her. I  had some thought about a neuroma but it seems too superficial for that. Wound #1 status is Open. Original cause of wound was Gradually Appeared. The wound is located on the Left,Posterior Lower Leg. The wound measures 0.7cm length x 0.5cm width x 0.1cm depth; 0.275cm^2 area and 0.027cm^3 volume. There is Fat Layer (Subcutaneous Tissue) Exposed exposed. There is no tunneling or undermining noted. There is a medium amount of serous drainage noted. The wound margin is flat and intact. There is small (1-33%) pink granulation within the wound bed. There is a large (67-100%) amount of necrotic tissue within the wound bed including Eschar and Adherent Slough. Assessment Active Problems Deanna White, Deanna White ([id number]) ICD-10 Non-pressure chronic ulcer of left calf limited to breakdown of skin Chronic venous hypertension (idiopathic) with ulcer and inflammation of left lower extremity Atherosclerosis of native arteries of left leg with ulceration of calf Procedures Wound #1 Pre-procedure diagnosis of Wound #1 is a Venous Leg Ulcer located on the Left,Posterior Lower Leg .Severity of Tissue Pre Debridement is: Fat layer exposed. There was a Excisional Skin/Subcutaneous Tissue Debridement with a total area of 0.35 sq cm performed by Ricard Dillon, MD. With the following instrument(s): Curette to remove Viable and Non-Viable tissue/material. Material removed includes Subcutaneous Tissue and Slough and after achieving pain control using Lidocaine. No specimens were taken. A time out was conducted at 14:24,  prior to the start of the procedure. A Minimum amount of bleeding was controlled with Pressure. The procedure was tolerated well. Post Debridement Measurements: 0.7cm length x 0.5cm width x 0.2cm depth; 0.055cm^3 volume. Character of Wound/Ulcer Post Debridement is stable. Severity of Tissue Post Debridement is: Fat layer exposed. Post procedure Diagnosis Wound #1: Same as Pre-Procedure Plan Wound  Cleansing: Wound #1 Left,Posterior Lower Leg: Clean wound with Normal Saline. May Shower, gently pat wound dry prior to applying new dressing. Anesthetic (add to Medication List): Wound #1 Left,Posterior Lower Leg: Topical Lidocaine 4% cream applied to wound bed prior to debridement (In Clinic Only). Primary Wound Dressing: Wound #1 Left,Posterior Lower Leg: Silver Collagen Secondary Dressing: Wound #1 Left,Posterior Lower Leg: Conform/Kerlix - Lightly to secure collagen in place. Dressing Change Frequency: Wound #1 Left,Posterior Lower Leg: Change Dressing Monday, Wednesday, Friday Follow-up Appointments: Wound #1 Left,Posterior Lower Leg: Return Appointment in 1 week. Edema Control: Wound #1 Left,Posterior Lower Leg: Elevate legs to the level of the heart and pump ankles as often as possible 1. Continue with silver collagen with a border foam dressing 2. The nodule on her left lateral ankle area is really of unclear etiology to me. I am sending her to see dermatology to see if Deanna White, Deanna White. ([id number]) they have any thoughts about this and why she is in so much pain. 3. I initially had her revascularized this does not seem to have helped. Had some consideration of acute arthritis in the ankle but the physical findings do not seem to support this. I am really at a loss here. If dermatology is not helpful I wonder about sending her to see orthopedics Electronic Signature(s) Signed: 04/09/2019 5:45:20 PM By: Linton Ham MD Entered By: Linton Ham on 04/09/2019 17:43:58 Deanna White ([id number]) -------------------------------------------------------------------------------- [place] Details Patient Name: Deanna White Date of Service: 04/09/2019 Medical Record Number: [id number] Patient Account Number: [id number] Date of Birth/Sex: 20-Aug-1925 (85 y.o. F) Treating RN: Cornell Barman Primary Care Provider: Deborra Medina Other Clinician: Referring Provider: Deborra Medina Treating Provider/Extender: Tito Dine in Treatment: 8 Diagnosis Coding ICD-10 Codes Code Description [id number] Non-pressure chronic ulcer of left calf limited to breakdown of skin I87.332 Chronic venous hypertension (idiopathic) with ulcer and inflammation of left lower extremity I70.242 Atherosclerosis of native arteries of left leg with ulceration of calf Facility Procedures CPT4 Code: [id number] Description: 56314 - DEB SUBQ TISSUE 20 SQ CM/< ICD-10 Diagnosis Description L97.221 Non-pressure chronic ulcer of left calf limited to breakdown Modifier: of skin Quantity: 1 Physician Procedures CPT4 Code: [id number] Description: 85885 - WC PHYS SUBQ TISS 20 SQ CM ICD-10 Diagnosis Description L97.221 Non-pressure chronic ulcer of left calf limited to breakdown Modifier: of skin Quantity: 1 Electronic Signature(s) Signed: 04/09/2019 5:45:20 PM By: Linton Ham MD Entered By: Linton Ham on 04/09/2019 17:44:17

## 2019-04-10 NOTE — Progress Notes (Signed)
MRN : [id number]  Deanna White is a 85 y.o. ([birth year]) female who presents with chief complaint of  Chief Complaint  Patient presents with  . Follow-up    ultrasound  .  History of Present Illness:    The patient returns to the office for followup and review status post angiogram with intervention on 03/14/2019.  Procedure(s) Performed: 1. Introduction catheter into left lower extremity 3rd order catheter placement  2. Contrast injection left lower extremity for distal runoff   3. Crosser atherectomy of the left anterior artery 4. Percutaneous transluminal angioplasty left anterior artery with a 2 mm x 30 cm Ultraverse balloon             5.  Percutaneous transluminal angioplasty of the SFA and popliteal arteries with a 5 mm x 60 mm Lutonix balloon                      6.   Star close closure right common femoral arteriotomy   The patient notes improvement in the lower extremity symptoms. No interval shortening of the patient's claudication distance or rest pain symptoms. Previous wounds have now healed.  No new ulcers or wounds have occurred since the last visit.  There have been no significant changes to the patient's overall health care.  The patient denies amaurosis fugax or recent TIA symptoms. There are no recent neurological changes noted. The patient denies history of DVT, PE or superficial thrombophlebitis. The patient denies recent episodes of angina or shortness of breath.     Current Meds  Medication Sig  . amLODipine (NORVASC) 5 MG tablet Take 1 tablet (5 mg total) by mouth daily. (Patient taking differently: Take 5 mg by mouth daily with lunch. )  . aspirin EC 81 MG tablet Take 81 mg by mouth at bedtime.   . cloNIDine (CATAPRES) 0.1 MG tablet Take 1 tablet (0.1 mg total) by mouth 2 (two) times daily.  . clopidogrel (PLAVIX) 75 MG tablet Take 1 tablet (75 mg total) by mouth daily.  . dorzolamide (TRUSOPT) 2 %  ophthalmic solution Place 1 drop into both eyes 3 (three) times daily.   . hydrochlorothiazide (HYDRODIURIL) 25 MG tablet Take 1 tablet (25 mg total) by mouth daily.  Marland Kitchen levothyroxine (SYNTHROID) 75 MCG tablet TAKE 1 TABLET BY MOUTH EVERY DAY (Patient taking differently: Take 75 mcg by mouth daily. )  . losartan (COZAAR) 100 MG tablet Take 1 tablet (100 mg total) by mouth daily.  . Multiple Vitamins-Minerals (PRESERVISION AREDS 2) CAPS Take 1 capsule by mouth 2 (two) times daily.   Marland Kitchen triamcinolone cream (KENALOG) 0.1 % Apply 1 application topically 2 (two) times daily. To left ankle    Past Medical History:  Diagnosis Date  . Allergy   . Carotid artery occlusion   . Colon polyps   . Fall from slipping on ice Feb. 2015  . Heart murmur   . Hypertension   . Menopause   . Pneumonia   . Positive TB test    pt denies  . Sore throat   . Thyroid disease   . Varicose veins     Past Surgical History:  Procedure Laterality Date  . ABDOMINAL HYSTERECTOMY    . APPENDECTOMY    . BILATERAL SALPINGOOPHORECTOMY  1964  . CESAREAN SECTION    . EYE SURGERY Right March 2016   Cataract  . LOWER EXTREMITY ANGIOGRAPHY Left 03/14/2019   Procedure: LOWER EXTREMITY ANGIOGRAPHY;  Surgeon: Hortencia Pilar  G, MD;  Location: [facility];  Service: Cardiovascular;  Laterality: Left;  . TONSILLECTOMY      Social History Social History   Tobacco Use  . Smoking status: Never Smoker  . Smokeless tobacco: Never Used  Substance Use Topics  . Alcohol use: No  . Drug use: No    Family History Family History  Problem Relation Age of Onset  . Heart disease Mother        After age 68  . Hypertension Mother   . Deep vein thrombosis Mother   . Heart attack Mother   . Kidney disease Father   . Alcohol abuse Brother   . Heart disease Brother   . Cancer Brother        Lung cancer  . Cancer Brother        kidney cancer  . Varicose Veins Sister   . Heart disease Sister        After age 68  .  Heart attack Sister   . Heart disease Sister        After age 3  . Other Other        epilepsy  . Hypertension Other   . Cancer Paternal Grandmother        breast cancer    Allergies  Allergen Reactions  . Adhesive [Tape] Rash    Peels skin  . Brimonidine Rash and Other (See Comments)    Pt felt as if throat was closing up, pt also had rash     REVIEW OF SYSTEMS (Negative unless checked)  Constitutional: [] Weight loss  [] Fever  [] Chills Cardiac: [] Chest pain   [] Chest pressure   [] Palpitations   [] Shortness of breath when laying flat   [] Shortness of breath with exertion. Vascular:  [x] Pain in legs with walking   [] Pain in legs at rest  [] History of DVT   [] Phlebitis   [] Swelling in legs   [] Varicose veins   [x] Non-healing ulcers Pulmonary:   [] Uses home oxygen   [] Productive cough   [] Hemoptysis   [] Wheeze  [] COPD   [] Asthma Neurologic:  [] Dizziness   [] Seizures   [] History of stroke   [] History of TIA  [] Aphasia   [] Vissual changes   [] Weakness or numbness in arm   [] Weakness or numbness in leg Musculoskeletal:   [] Joint swelling   [x] Joint pain   [] Low back pain Hematologic:  [] Easy bruising  [] Easy bleeding   [] Hypercoagulable state   [] Anemic Gastrointestinal:  [] Diarrhea   [] Vomiting  [] Gastroesophageal reflux/heartburn   [] Difficulty swallowing. Genitourinary:  [] Chronic kidney disease   [] Difficult urination  [] Frequent urination   [] Blood in urine Skin:  [] Rashes   [] Ulcers  Psychological:  [] History of anxiety   []  History of major depression.  Physical Examination  Vitals:   04/10/19 1612  BP: 139/67  Pulse: 77  Resp: 17  Weight: 115 lb (52.2 kg)  Height: 5\' 2"  (1.575 m)   Body mass index is 21.03 kg/m. Gen: WD/WN, NAD Head: [place]/AT, No temporalis wasting.  Ear/Nose/Throat: Hearing grossly intact, nares w/o erythema or drainage Eyes: PER, EOMI, sclera nonicteric.  Neck: Supple, no large masses.   Pulmonary:  Good air movement, no audible wheezing  bilaterally, no use of accessory muscles.  Cardiac: RRR, no JVD Vascular: ulceration  Vessel Right Left  Radial Palpable Palpable  Brachial Palpable Palpable  Carotid Palpable Palpable  PT Palpable Not Palpable  DP Palpable Not Palpable  Gastrointestinal: Non-distended. No guarding/no peritoneal signs.  Musculoskeletal: M/S 5/5 throughout.  No deformity or atrophy.  Neurologic: CN 2-12 intact. Symmetrical.  Speech is fluent. Motor exam as listed above. Psychiatric: Judgment intact, Mood & affect appropriate for pt's clinical situation. Dermatologic: No rashes or ulcers noted.  No changes consistent with cellulitis. Lymph : No lichenification or skin changes of chronic lymphedema.  CBC Lab Results  Component Value Date   WBC 6.9 09/17/2018   HGB 11.2 (L) 09/17/2018   HCT 33.2 (L) 09/17/2018   MCV 95.2 09/17/2018   PLT 235.0 09/17/2018    BMET    Component Value Date/Time   NA 137 12/04/2018   K 4.2 12/04/2018   CL 99 09/23/2018 1056   CO2 25 09/23/2018 1056   GLUCOSE 116 (H) 09/23/2018 1056   BUN 18 03/13/2019 1138   BUN 16 12/04/2018   CREATININE 0.90 03/13/2019 1138   CALCIUM 8.6 09/23/2018 1056   GFRNONAA 56 (L) 03/13/2019 1138   GFRAA >60 03/13/2019 1138   CrCl cannot be calculated (Patient's most recent lab result is older than the maximum 21 days allowed.).  COAG No results found for: INR, PROTIME  Radiology Dg Ankle Complete Left  Result Date: 03/27/2019 CLINICAL DATA:  Lateral left ankle pain and stiffness since January, 2020. The patient reports a nodule on the lateral aspect of the ankle which she noticed 2-3 weeks ago. No known injury. EXAM: LEFT ANKLE COMPLETE - 3+ VIEW COMPARISON:  None. FINDINGS: There is no evidence of fracture, dislocation, or joint effusion. There is no evidence of arthropathy or other focal bone abnormality. Dorsal calcaneal spur is noted. Soft tissues appear mildly swollen. IMPRESSION: Soft tissues about the ankle appear mildly  swollen without underlying acute bony or joint abnormality. Dorsal calcaneal spur. Electronically Signed   By: Inge Rise M.D.   On: 03/27/2019 09:20    Assessment/Plan 1. Atherosclerosis of native arteries of the extremities with ulceration ([place]) Recommend:  The patient is status post successful angiogram with intervention.  The patient reports that the claudication symptoms and leg pain is essentially gone.   The patient denies lifestyle limiting changes at this point in time.  No further invasive studies, angiography or surgery at this time The patient should continue walking and begin a more formal exercise program.  The patient should continue antiplatelet therapy and aggressive treatment of the lipid abnormalities  Smoking cessation was again discussed  The patient should continue wearing graduated compression socks 10-15 mmHg strength to control the mild edema.  Patient should undergo noninvasive studies as ordered. The patient will follow up with me after the studies.   - VAS [place] ABI WITH/WO TBI; Future - VAS [place] LOWER EXTREMITY ARTERIAL DUPLEX; Future  2. Benign essential hypertension Continue antihypertensive medications as already ordered, these medications have been reviewed and there are no changes at this time.   3. Paroxysmal A-fib (HCC) Continue antiarrhythmia medications as already ordered, these medications have been reviewed and there are no changes at this time.  Continue anticoagulation as ordered by Cardiology Service   4. Venous insufficiency of both lower extremities No surgery or intervention at this point in time.    I have had a long discussion with the patient regarding venous insufficiency and why it  causes symptoms. I have discussed with the patient the chronic skin changes that accompany venous insufficiency and the long term sequela such as infection and ulceration.  Patient will begin wearing graduated compression stockings class 1 (20-30  mmHg) or compression wraps on a daily basis a prescription was given.  The patient will put the stockings on first thing in the morning and removing them in the evening. The patient is instructed specifically not to sleep in the stockings.    In addition, behavioral modification including several periods of elevation of the lower extremities during the day will be continued. I have demonstrated that proper elevation is a position with the ankles at heart level.  The patient is instructed to begin routine exercise, especially walking on a daily basis   5. Mixed hyperlipidemia Continue statin as ordered and reviewed, no changes at this time    Hortencia Pilar, MD  04/10/2019 4:21 PM

## 2019-04-10 NOTE — Progress Notes (Addendum)
TYAN, LASURE ([id number]) Visit Report for 04/09/2019 Arrival Information Details Patient Name: Deanna White, Deanna White Date of Service: 04/09/2019 2:00 PM Medical Record Number: [id number] Patient Account Number: [id number] Date of Birth/Sex: October 10, 1924 (85 y.o. F) Treating RN: Army Melia Primary Care Borden Thune: Deborra Medina Other Clinician: Referring Jaeda Bruso: Deborra Medina Treating Milady Fleener/Extender: Tito Dine in Treatment: 8 Visit Information History Since Last Visit Added or deleted any medications: No Patient Arrived: Ambulatory Any new allergies or adverse reactions: No Arrival Time: 14:05 Had a fall or experienced change in No Accompanied By: self activities of daily living that may affect Transfer Assistance: None risk of falls: Patient Has Alerts: Yes Signs or symptoms of abuse/neglect since last visito No Patient Alerts: Patient on Blood Thinner Hospitalized since last visit: No Aspirin 81 mg Has Dressing in Place as Prescribed: Yes ABI (R) 1.01 (L) 1.20 TBI (R) .57 (L) .10 Pain Present Now: No 03/06/2019 Electronic Signature(s) Signed: 04/10/2019 2:48:20 PM By: Army Melia Entered By: Army Melia on 04/09/2019 14:05:33 Deanna White ([id number]) -------------------------------------------------------------------------------- Encounter Discharge Information Details Patient Name: Deanna White Date of Service: 04/09/2019 2:00 PM Medical Record Number: [id number] Patient Account Number: [id number] Date of Birth/Sex: November 12, 1924 (85 y.o. F) Treating RN: Army Melia Primary Care Shavonne Ambroise: Deborra Medina Other Clinician: Referring Ajee Heasley: Deborra Medina Treating Rosbel Buckner/Extender: Tito Dine in Treatment: 8 Encounter Discharge Information Items Post Procedure Vitals Discharge Condition: Stable Temperature (F): 98.3 Ambulatory Status: Ambulatory Pulse (bpm): 65 Discharge Destination: Home Respiratory Rate (breaths/min): 16 Transportation:  Private Auto Blood Pressure (mmHg): 191/64 Accompanied By: self Schedule Follow-up Appointment: No Clinical Summary of Care: Electronic Signature(s) Signed: 04/10/2019 2:48:20 PM By: Army Melia Entered By: Army Melia on 04/09/2019 14:35:50 Deanna White ([id number]) -------------------------------------------------------------------------------- Lower Extremity Assessment Details Patient Name: Deanna White Date of Service: 04/09/2019 2:00 PM Medical Record Number: [id number] Patient Account Number: [id number] Date of Birth/Sex: 06/01/1925 (85 y.o. F) Treating RN: Army Melia Primary Care Alaysha Jefcoat: Deborra Medina Other Clinician: Referring Jerzee Jerome: Deborra Medina Treating Patrisia Faeth/Extender: Ricard Dillon Weeks in Treatment: 8 Edema Assessment Assessed: [Left: No] [Right: No] Edema: [Left: N] [Right: o] Vascular Assessment Pulses: Dorsalis Pedis Palpable: [Left:Yes] Electronic Signature(s) Signed: 04/10/2019 2:48:20 PM By: Army Melia Entered By: Army Melia on 04/09/2019 14:09:12 Deanna White ([id number]) -------------------------------------------------------------------------------- Multi Wound Chart Details Patient Name: Deanna White Date of Service: 04/09/2019 2:00 PM Medical Record Number: [id number] Patient Account Number: [id number] Date of Birth/Sex: 1924/11/25 (85 y.o. F) Treating RN: Cornell Barman Primary Care Steph Cheadle: Deborra Medina Other Clinician: Referring Lynsay Fesperman: Deborra Medina Treating Keonia Pasko/Extender: Tito Dine in Treatment: 8 Vital Signs Height(in): 62 Pulse(bpm): 65 Weight(lbs): 118 Blood Pressure(mmHg): 191/64 Body Mass Index(BMI): 22 Temperature(F): 98.3 Respiratory Rate 16 (breaths/min): Photos: [N/A:N/A] Wound Location: Left Lower Leg - Posterior N/A N/A Wounding Event: Gradually Appeared N/A N/A Primary Etiology: Venous Leg Ulcer N/A N/A Secondary Etiology: Arterial Insufficiency Ulcer N/A N/A Comorbid History:  Cataracts, Hypertension N/A N/A Date Acquired: 10/02/2018 N/A N/A Weeks of Treatment: 8 N/A N/A Wound Status: Open N/A N/A Measurements L x W x D 0.7x0.5x0.1 N/A N/A (cm) Area (cm) : 0.275 N/A N/A Volume (cm) : 0.027 N/A N/A % Reduction in Area: 22.10% N/A N/A % Reduction in Volume: 22.90% N/A N/A Classification: Full Thickness Without N/A N/A Exposed Support Structures Exudate Amount: Medium N/A N/A Exudate Type: Serous N/A N/A Exudate Color: amber N/A N/A Wound Margin: Flat and Intact N/A N/A Granulation Amount: Small (1-33%) N/A N/A Granulation Quality: Pink N/A  N/A Necrotic Amount: Large (67-100%) N/A N/A Necrotic Tissue: Eschar, Adherent Slough N/A N/A Exposed Structures: Fat Layer (Subcutaneous N/A N/A Tissue) Exposed: Yes Fascia: No Tendon: No Muscle: No Deanna White, NUNES. ([id number]) Joint: No Bone: No Epithelialization: Small (1-33%) N/A N/A Debridement: Debridement - Excisional N/A N/A Pre-procedure 14:24 N/A N/A Verification/Time Out Taken: Pain Control: Lidocaine N/A N/A Tissue Debrided: Subcutaneous, Slough N/A N/A Level: Skin/Subcutaneous Tissue N/A N/A Debridement Area (sq cm): 0.35 N/A N/A Instrument: Curette N/A N/A Bleeding: Minimum N/A N/A Hemostasis Achieved: Pressure N/A N/A Debridement Treatment Procedure was tolerated well N/A N/A Response: Post Debridement 0.7x0.5x0.2 N/A N/A Measurements L x W x D (cm) Post Debridement Volume: 0.055 N/A N/A (cm) Procedures Performed: Debridement N/A N/A Treatment Notes Wound #1 (Left, Posterior Lower Leg) Notes Prisma, conform, tape Electronic Signature(s) Signed: 04/09/2019 5:45:20 PM By: Linton Ham MD Entered By: Linton Ham on 04/09/2019 17:38:05 Deanna White ([id number]) -------------------------------------------------------------------------------- Multi-Disciplinary Care Plan Details Patient Name: Deanna White Date of Service: 04/09/2019 2:00 PM Medical Record Number:  [id number] Patient Account Number: [id number] Date of Birth/Sex: Feb 06, 1925 (85 y.o. F) Treating RN: Cornell Barman Primary Care Rommel Hogston: Deborra Medina Other Clinician: Referring Bary Limbach: Deborra Medina Treating Harun Brumley/Extender: Tito Dine in Treatment: 8 Active Inactive Electronic Signature(s) Signed: 05/05/2019 9:55:03 AM By: Gretta Cool, BSN, RN, CWS, Kim RN, BSN Previous Signature: 04/09/2019 5:54:22 PM Version By: Gretta Cool, BSN, RN, CWS, Kim RN, BSN Entered By: Gretta Cool, BSN, RN, CWS, Kim on 05/05/2019 09:55:02 Deanna White ([id number]) -------------------------------------------------------------------------------- Pain Assessment Details Patient Name: Deanna White Date of Service: 04/09/2019 2:00 PM Medical Record Number: [id number] Patient Account Number: [id number] Date of Birth/Sex: Mar 12, 1925 (85 y.o. F) Treating RN: Army Melia Primary Care Aaliyah Cancro: Deborra Medina Other Clinician: Referring Kayly Kriegel: Deborra Medina Treating Darcel Frane/Extender: Tito Dine in Treatment: 8 Active Problems Location of Pain Severity and Description of Pain Patient Has Paino No Site Locations Pain Management and Medication Current Pain Management: Electronic Signature(s) Signed: 04/10/2019 2:48:20 PM By: Army Melia Entered By: Army Melia on 04/09/2019 14:05:40 Deanna White ([id number]) -------------------------------------------------------------------------------- Patient/Caregiver Education Details Patient Name: Deanna White Date of Service: 04/09/2019 2:00 PM Medical Record Number: [id number] Patient Account Number: [id number] Date of Birth/Gender: 10/25/1924 (85 y.o. F) Treating RN: Cornell Barman Primary Care Physician: Deborra Medina Other Clinician: Referring Physician: Deborra Medina Treating Physician/Extender: Tito Dine in Treatment: 8 Education Assessment Education Provided To: Patient Education Topics Provided Welcome To [facility]: Wound/Skin Impairment: Handouts: Caring for Your Ulcer Methods: Demonstration, Explain/Verbal Responses: State content correctly Electronic Signature(s) Signed: 04/09/2019 5:54:22 PM By: Gretta Cool, BSN, RN, CWS, Kim RN, BSN Entered By: Gretta Cool, BSN, RN, CWS, Kim on 04/09/2019 14:28:13 Deanna White ([id number]) -------------------------------------------------------------------------------- Wound Assessment Details Patient Name: Deanna White Date of Service: 04/09/2019 2:00 PM Medical Record Number: [id number] Patient Account Number: [id number] Date of Birth/Sex: [birth year] (85 y.o. F) Treating RN: Army Melia Primary Care Kenia Teagarden: Deborra Medina Other Clinician: Referring Kalliope Riesen: Deborra Medina Treating Maxwell Martorano/Extender: Tito Dine in Treatment: 8 Wound Status Wound Number: 1 Primary Etiology: Venous Leg Ulcer Wound Location: Left Lower Leg - Posterior Secondary Etiology: Arterial Insufficiency Ulcer Wounding Event: Gradually Appeared Wound Status: Open Date Acquired: 10/02/2018 Comorbid History: Cataracts, Hypertension Weeks Of Treatment: 8 Clustered Wound: No Photos Wound Measurements Length: (cm) 0.7 Width: (cm) 0.5 Depth: (cm) 0.1 Area: (cm) 0.275 Volume: (cm) 0.027 % Reduction in Area: 22.1% % Reduction in Volume: 22.9% Epithelialization: Small (1-33%) Tunneling: No Undermining: No Wound  Description Full Thickness Without Exposed Support Foul O Classification: Structures Slough Wound Margin: Flat and Intact Exudate Medium Amount: Exudate Type: Serous Exudate Color: amber dor After Cleansing: No /Fibrino Yes Wound Bed Granulation Amount: Small (1-33%) Exposed Structure Granulation Quality: Pink Fascia Exposed: No Necrotic Amount: Large (67-100%) Fat Layer (Subcutaneous Tissue) Exposed: Yes Necrotic Quality: Eschar, Adherent Slough Tendon Exposed: No Muscle Exposed: No Joint Exposed: No Bone Exposed: No Deanna White, Deanna White  ([id number]) Electronic Signature(s) Signed: 04/10/2019 2:48:20 PM By: Army Melia Entered By: Army Melia on 04/09/2019 14:08:55 Deanna White ([id number]) -------------------------------------------------------------------------------- Vitals Details Patient Name: Deanna White Date of Service: 04/09/2019 2:00 PM Medical Record Number: [id number] Patient Account Number: [id number] Date of Birth/Sex: [birth year] (85 y.o. F) Treating RN: Army Melia Primary Care Taima Rada: Deborra Medina Other Clinician: Referring Rontae Inglett: Deborra Medina Treating Stpehen Petitjean/Extender: Tito Dine in Treatment: 8 Vital Signs Time Taken: 14:05 Temperature (F): 98.3 Height (in): 62 Pulse (bpm): 65 Weight (lbs): 118 Respiratory Rate (breaths/min): 16 Body Mass Index (BMI): 21.6 Blood Pressure (mmHg): 191/64 Reference Range: 80 - 120 mg / dl Electronic Signature(s) Signed: 04/10/2019 2:48:20 PM By: Army Melia Entered By: Army Melia on 04/09/2019 14:07:54

## 2019-04-10 NOTE — Progress Notes (Signed)
Deanna White, Deanna White ([id number]) Visit Report for 04/02/2019 Debridement Details Patient Name: Deanna White, Deanna White Date of Service: 04/02/2019 2:00 PM Medical Record Number: [id number] Patient Account Number: [id number] Date of Birth/Sex: 1924-10-29 (85 y.o. F) Treating RN: Cornell Barman Primary Care Provider: Deborra Medina Other Clinician: Referring Provider: Deborra Medina Treating Provider/Extender: Tito Dine in Treatment: 7 Debridement Performed for Wound #1 Left,Posterior Lower Leg Assessment: Performed By: Physician Ricard Dillon, MD Debridement Type: Debridement Severity of Tissue Pre Fat layer exposed Debridement: Level of Consciousness (Pre- Awake and Alert procedure): Pre-procedure Verification/Time Yes - 14:33 Out Taken: Start Time: 14:33 Pain Control: Lidocaine Total Area Debrided (L x W): 0.9 (cm) x 0.4 (cm) = 0.36 (cm) Tissue and other material Viable, Non-Viable, Slough, Subcutaneous, Slough debrided: Level: Skin/Subcutaneous Tissue Debridement Description: Excisional Instrument: Curette Bleeding: Minimum Hemostasis Achieved: Pressure End Time: 14:36 Response to Treatment: Procedure was tolerated well Level of Consciousness Awake and Alert (Post-procedure): Post Debridement Measurements of Total Wound Length: (cm) 0.9 Width: (cm) 0.5 Depth: (cm) 0.1 Volume: (cm) 0.035 Character of Wound/Ulcer Post Debridement: Stable Severity of Tissue Post Debridement: Fat layer exposed Post Procedure Diagnosis Same as Pre-procedure Electronic Signature(s) Signed: 04/02/2019 6:06:19 PM By: Linton Ham MD Signed: 04/07/2019 5:43:16 PM By: Gretta Cool, BSN, RN, CWS, Kim RN, BSN Entered By: Linton Ham on 04/02/2019 18:02:29 Deanna White ([id number]) -------------------------------------------------------------------------------- HPI Details Patient Name: Deanna White Date of Service: 04/02/2019 2:00 PM Medical Record Number: [id number] Patient Account  Number: [id number] Date of Birth/Sex: [birth year] (85 y.o. F) Treating RN: Cornell Barman Primary Care Provider: Deborra Medina Other Clinician: Referring Provider: Deborra Medina Treating Provider/Extender: Tito Dine in Treatment: 7 History of Present Illness HPI Description: 02/12/2019 Admission This is a very functional 85 year old woman who drove herself to our clinic today. She is concerned about a wound on her left posterior calf. This apparently started 4 to 6 weeks ago at which time she developed increasing swelling and redness in her lower leg. She saw her primary doctor in April who thought this was a venous insufficiency ulcer with some degree of good venous stasis. She did receive antibiotics as well as triamcinolone cream for the inflammation. The patient complains of a lot of pain both in the wound and also in the lateral part of her ankle which is especially worse at night and relieved by standing or walking. She is a nondiabetic and non-smoker. The patient had a venous Doppler on 10/09/2018 that was negative for a DVT and no reflux noted in the greater saphenous vein. No comment on superficial vein thrombosis. Past medical history includes chronic kidney disease stage III, hypothyroidism, glaucoma, history of venous ulcer, hypertension ABIs in our clinic were not obtainable on the left even with the Doppler. 0.56 on the right 5/20-Patient returns to clinic after 1 week, she was started on Prisma with every other day dressing changes, she has noticed pain with leg up, but denies pain with ambulation. Dopplers are scheduled for June 4 and ABIs are not obtained in clinic 5/27 still using Prisma. Arterial studies are booked for June 4. We are still not using compression due to the complete absence of peripheral pulses in the right foot and a very low ABI. In passing the patient mentioned a very painful area on her lateral right ankle just posterior to the malleolus. She has a  very small nodule. Very tender. I thought this might have a core and I removed some eschar on the top of this but  there was nothing to drain. This does not look as though it should be so tender. There was no purulence nothing I thought I could culture. I wondered whether this might be a dermatofibroma however I do not think these are painful. As mentioned I saw no evidence of infection in this use. 6/3; patient is still using Prisma. We do not have a viable wound surface on her original wound area. This continues to require debridement. The small nodule on the right ankle had open this week I am not sure what this is however it is exquisitely painful. 6/10; ARTERIAL studies were done on 6/4. This showed an ABI in the right of 1.01 and a TBI of 0.57. On the left her ABI at the anterior tibial was 1.20. However her TBI was 0.10. Monophasic waveforms mostly bilaterally. She went on to see Dr. Delana Meyer the same day. The patient is undergoing angiography of the left lower extremity with the hope of intervention. He also does felt she had venous insufficiency of both lower extremities. We are using silver collagen to the wounds 6/17. The patient had her angiogram on 03/11/2019. She had angioplasty of the left anterior tibial artery. Angioplasty of the SFA and popliteal arteries. We have been using silver collagen to her 2 wound areas 6/24; the patient has 1 small remaining wound on the left posterior calf. We are using silver collagen. She has the small nodule on the right lateral ankle. This is healed over. When she first came here this had eschar in the top that I removed and it is now closed over but still extremely tender and somewhat inflamed. She has chronic venous insufficiency, this does not appear to be vascular. 7/1; small wound still remaining. We have been using silver collagen. She continues to complain of the nodule on the left lateral ankle as a source of pain. X-ray we did of the ankle  showed a dorsal calcaneal spur but I do not think that is in this area. There was no underlying acute bony or joint abnormality Electronic Signature(s) HUMNA, [employer] ([id number]) Signed: 04/07/2019 5:30:10 PM By: Gretta Cool, BSN, RN, CWS, Kim RN, BSN Signed: 04/09/2019 5:45:20 PM By: Linton Ham MD Previous Signature: 04/02/2019 6:06:19 PM Version By: Linton Ham MD Entered By: Gretta Cool, BSN, RN, CWS, Kim on 04/07/2019 17:30:10 Deanna White, Deanna White ([id number]) -------------------------------------------------------------------------------- Physical Exam Details Patient Name: Deanna White Date of Service: 04/02/2019 2:00 PM Medical Record Number: [id number] Patient Account Number: [id number] Date of Birth/Sex: January 13, 1925 (85 y.o. F) Treating RN: Cornell Barman Primary Care Provider: Deborra Medina Other Clinician: Referring Provider: Deborra Medina Treating Provider/Extender: Tito Dine in Treatment: 7 Cardiovascular Pedal pulses are palpable but reduced on the left. The patient has no edema. Notes Wound exam; posterior calf wound tightly adherent debris again debrided with a #3 curette. I am able to get this to have a healthy looking base I am hopeful we can get epithelialization oThe small tender area on the lateral ankle is of unclear etiology this is not fluid-filled. Is not particularly inflamed or infected looking. There is not appear to be any surrounding cutaneous issues. Electronic Signature(s) Signed: 04/02/2019 6:06:19 PM By: Linton Ham MD Entered By: Linton Ham on 04/02/2019 18:04:31 Deanna White ([id number]) -------------------------------------------------------------------------------- Physician Orders Details Patient Name: Deanna White Date of Service: 04/02/2019 2:00 PM Medical Record Number: [id number] Patient Account Number: [id number] Date of Birth/Sex: 1924-11-13 (85 y.o. F) Treating RN: Cornell Barman Primary Care Provider: Deborra Medina Other  Clinician:  Referring Provider: Deborra Medina Treating Provider/Extender: Tito Dine in Treatment: 7 Verbal / Phone Orders: No Diagnosis Coding Wound Cleansing Wound #1 Left,Posterior Lower Leg o Clean wound with Normal Saline. o May Shower, gently pat wound dry prior to applying new dressing. Anesthetic (add to Medication List) Wound #1 Left,Posterior Lower Leg o Topical Lidocaine 4% cream applied to wound bed prior to debridement (In Clinic Only). o Benzocaine Topical Anesthetic Spray applied to wound bed prior to debridement (In Clinic Only). Primary Wound Dressing Wound #1 Left,Posterior Lower Leg o Silver Collagen Secondary Dressing Wound #1 Left,Posterior Lower Leg o Conform/Kerlix - Lightly to secure collagen in place. Dressing Change Frequency Wound #1 Left,Posterior Lower Leg o Change Dressing Monday, Wednesday, Friday Follow-up Appointments Wound #1 Left,Posterior Lower Leg o Return Appointment in 1 week. Edema Control Wound #1 Left,Posterior Lower Leg o Elevate legs to the level of the heart and pump ankles as often as possible Consults o Dermatology - For Left Lateral Ankle painful nodule Electronic Signature(s) Signed: 04/02/2019 6:06:19 PM By: Linton Ham MD Signed: 04/07/2019 5:43:16 PM By: Gretta Cool, BSN, RN, CWS, Kim RN, BSN Entered By: Gretta Cool, BSN, RN, CWS, Kim on 04/02/2019 14:37:55 Deanna White ([id number]) -------------------------------------------------------------------------------- Problem List Details Patient Name: Deanna White Date of Service: 04/02/2019 2:00 PM Medical Record Number: [id number] Patient Account Number: [id number] Date of Birth/Sex: 1925/05/25 (85 y.o. F) Treating RN: Cornell Barman Primary Care Provider: Deborra Medina Other Clinician: Referring Provider: Deborra Medina Treating Provider/Extender: Tito Dine in Treatment: 7 Active Problems ICD-10 Evaluated Encounter Code Description  Active Date Today Diagnosis L97.221 Non-pressure chronic ulcer of left calf limited to breakdown of 02/12/2019 No Yes skin I87.332 Chronic venous hypertension (idiopathic) with ulcer and 02/12/2019 No Yes inflammation of left lower extremity I70.242 Atherosclerosis of native arteries of left leg with ulceration of 02/12/2019 No Yes calf Inactive Problems Resolved Problems Electronic Signature(s) Signed: 04/02/2019 6:06:19 PM By: Linton Ham MD Entered By: Linton Ham on 04/02/2019 18:02:11 Deanna White ([id number]) -------------------------------------------------------------------------------- Progress Note Details Patient Name: Deanna White Date of Service: 04/02/2019 2:00 PM Medical Record Number: [id number] Patient Account Number: [id number] Date of Birth/Sex: 1924/11/26 (85 y.o. F) Treating RN: Cornell Barman Primary Care Provider: Deborra Medina Other Clinician: Referring Provider: Deborra Medina Treating Provider/Extender: Tito Dine in Treatment: 7 Subjective History of Present Illness (HPI) 02/12/2019 Admission This is a very functional 85 year old woman who drove herself to our clinic today. She is concerned about a wound on her left posterior calf. This apparently started 4 to 6 weeks ago at which time she developed increasing swelling and redness in her lower leg. She saw her primary doctor in April who thought this was a venous insufficiency ulcer with some degree of good venous stasis. She did receive antibiotics as well as triamcinolone cream for the inflammation. The patient complains of a lot of pain both in the wound and also in the lateral part of her ankle which is especially worse at night and relieved by standing or walking. She is a nondiabetic and non-smoker. The patient had a venous Doppler on 10/09/2018 that was negative for a DVT and no reflux noted in the greater saphenous vein. No comment on superficial vein thrombosis. Past medical history  includes chronic kidney disease stage III, hypothyroidism, glaucoma, history of venous ulcer, hypertension ABIs in our clinic were not obtainable on the left even with the Doppler. 0.56 on the right 5/20-Patient returns to clinic after 1 week, she was started  on Prisma with every other day dressing changes, she has noticed pain with leg up, but denies pain with ambulation. Dopplers are scheduled for June 4 and ABIs are not obtained in clinic 5/27 still using Prisma. Arterial studies are booked for June 4. We are still not using compression due to the complete absence of peripheral pulses in the right foot and a very low ABI. In passing the patient mentioned a very painful area on her lateral right ankle just posterior to the malleolus. She has a very small nodule. Very tender. I thought this might have a core and I removed some eschar on the top of this but there was nothing to drain. This does not look as though it should be so tender. There was no purulence nothing I thought I could culture. I wondered whether this might be a dermatofibroma however I do not think these are painful. As mentioned I saw no evidence of infection in this use. 6/3; patient is still using Prisma. We do not have a viable wound surface on her original wound area. This continues to require debridement. The small nodule on the right ankle had open this week I am not sure what this is however it is exquisitely painful. 6/10; ARTERIAL studies were done on 6/4. This showed an ABI in the right of 1.01 and a TBI of 0.57. On the left her ABI at the anterior tibial was 1.20. However her TBI was 0.10. Monophasic waveforms mostly bilaterally. She went on to see Dr. Delana Meyer the same day. The patient is undergoing angiography of the left lower extremity with the hope of intervention. He also does felt she had venous insufficiency of both lower extremities. We are using silver collagen to the wounds 6/17. The patient had her angiogram  on 03/11/2019. She had angioplasty of the left anterior tibial artery. Angioplasty of the SFA and popliteal arteries. We have been using silver collagen to her 2 wound areas 6/24; the patient has 1 small remaining wound on the left posterior calf. We are using silver collagen. She has the small nodule on the right lateral ankle. This is healed over. When she first came here this had eschar in the top that I removed and it is now closed over but still extremely tender and somewhat inflamed. She has chronic venous insufficiency, this does not appear to be vascular. 7/1; small wound still remaining. We have been using silver collagen. She continues to complain of the nodule on the left lateral ankle as a source of pain. X-ray we did of the ankle showed a dorsal calcaneal spur but I do not think that is in this area. There was no underlying acute bony or joint abnormality Deanna White, Deanna White. ([id number]) Objective Constitutional Vitals Time Taken: 2:10 PM, Height: 62 in, Weight: 118 lbs, BMI: 21.6, Temperature: 98.2 F, Pulse: 65 bpm, Respiratory Rate: 16 breaths/min, Blood Pressure: 162/44 mmHg. Cardiovascular Pedal pulses are palpable but reduced on the left. The patient has no edema. General Notes: Wound exam; posterior calf wound tightly adherent debris again debrided with a #3 curette. I am able to get this to have a healthy looking base I am hopeful we can get epithelialization The small tender area on the lateral ankle is of unclear etiology this is not fluid-filled. Is not particularly inflamed or infected looking. There is not appear to be any surrounding cutaneous issues. Integumentary (Hair, Skin) Wound #1 status is Open. Original cause of wound was Gradually Appeared. The wound is located on the  Left,Posterior Lower Leg. The wound measures 0.9cm length x 0.4cm width x 0.1cm depth; 0.283cm^2 area and 0.028cm^3 volume. There is Fat Layer (Subcutaneous Tissue) Exposed exposed. There is no  tunneling or undermining noted. There is a medium amount of serous drainage noted. The wound margin is flat and intact. There is medium (34-66%) pink granulation within the wound bed. There is a medium (34-66%) amount of necrotic tissue within the wound bed including Adherent Slough. Wound #2 status is Healed - Epithelialized. Original cause of wound was Gradually Appeared. The wound is located on the Left,Lateral Malleolus. The wound measures 0cm length x 0cm width x 0cm depth; 0cm^2 area and 0cm^3 volume. The wound is limited to skin breakdown. There is no tunneling or undermining noted. There is a none present amount of drainage noted. The wound margin is indistinct and nonvisible. There is no granulation within the wound bed. There is no necrotic tissue within the wound bed. Assessment Active Problems ICD-10 Non-pressure chronic ulcer of left calf limited to breakdown of skin Chronic venous hypertension (idiopathic) with ulcer and inflammation of left lower extremity Atherosclerosis of native arteries of left leg with ulceration of calf Procedures Wound #1 Pre-procedure diagnosis of Wound #1 is a Venous Leg Ulcer located on the Left,Posterior Lower Leg .Severity of Tissue Pre Debridement is: Fat layer exposed. There was a Excisional Skin/Subcutaneous Tissue Debridement with a total area of 0.36 Deanna White, Deanna S. ([id number]) sq cm performed by Ricard Dillon, MD. With the following instrument(s): Curette to remove Viable and Non-Viable tissue/material. Material removed includes Subcutaneous Tissue and Slough and after achieving pain control using Lidocaine. No specimens were taken. A time out was conducted at 14:33, prior to the start of the procedure. A Minimum amount of bleeding was controlled with Pressure. The procedure was tolerated well. Post Debridement Measurements: 0.9cm length x 0.5cm width x 0.1cm depth; 0.035cm^3 volume. Character of Wound/Ulcer Post Debridement is stable.  Severity of Tissue Post Debridement is: Fat layer exposed. Post procedure Diagnosis Wound #1: Same as Pre-Procedure Plan Wound Cleansing: Wound #1 Left,Posterior Lower Leg: Clean wound with Normal Saline. May Shower, gently pat wound dry prior to applying new dressing. Anesthetic (add to Medication List): Wound #1 Left,Posterior Lower Leg: Topical Lidocaine 4% cream applied to wound bed prior to debridement (In Clinic Only). Benzocaine Topical Anesthetic Spray applied to wound bed prior to debridement (In Clinic Only). Primary Wound Dressing: Wound #1 Left,Posterior Lower Leg: Silver Collagen Secondary Dressing: Wound #1 Left,Posterior Lower Leg: Conform/Kerlix - Lightly to secure collagen in place. Dressing Change Frequency: Wound #1 Left,Posterior Lower Leg: Change Dressing Monday, Wednesday, Friday Follow-up Appointments: Wound #1 Left,Posterior Lower Leg: Return Appointment in 1 week. Edema Control: Wound #1 Left,Posterior Lower Leg: Elevate legs to the level of the heart and pump ankles as often as possible Consults ordered were: Dermatology - For Left Lateral Ankle painful nodule 1. I am continue with silver collagen to the small area on the left posterior leg 2. The nodule on her left lateral lower leg is of uncertain etiology. Nor do I really understand why this is so painful both with and without palpation. She has a Paediatric nurse at [facility] clinic I am going to refer her over there to see if they have any thoughts about this. Electronic Signature(s) Signed: 04/07/2019 5:30:38 PM By: Gretta Cool, BSN, RN, CWS, Kim RN, BSN Signed: 04/09/2019 5:45:20 PM By: Linton Ham MD Previous Signature: 04/02/2019 6:06:19 PM Version By: Linton Ham MD Entered By: Gretta Cool, BSN, RN, CWS, Maudie Mercury  on 04/07/2019 17:30:38 Deanna White, Deanna White ([id number]) -------------------------------------------------------------------------------- SuperBill Details Patient Name: Deanna White Date of Service:  04/02/2019 Medical Record Number: [id number] Patient Account Number: [id number] Date of Birth/Sex: 30-Jul-1925 (85 y.o. F) Treating RN: Cornell Barman Primary Care Provider: Deborra Medina Other Clinician: Referring Provider: Deborra Medina Treating Provider/Extender: Tito Dine in Treatment: 7 Diagnosis Coding ICD-10 Codes Code Description [phone] Non-pressure chronic ulcer of left calf limited to breakdown of skin I87.332 Chronic venous hypertension (idiopathic) with ulcer and inflammation of left lower extremity I70.242 Atherosclerosis of native arteries of left leg with ulceration of calf Facility Procedures CPT4: Description Modifier Quantity Code [id number] - WOUND CARE VISIT-LEV 2 EST PT 1 CPT4: [id number] 11042 - DEB SUBQ TISSUE 20 SQ CM/< 1 ICD-10 Diagnosis Description L97.221 Non-pressure chronic ulcer of left calf limited to breakdown of skin I87.332 Chronic venous hypertension (idiopathic) with ulcer and inflammation of left lower  extremity Physician Procedures CPT4: Description Modifier Quantity Code [id number] 20355 - WC PHYS SUBQ TISS 20 SQ CM 1 ICD-10 Diagnosis Description L97.221 Non-pressure chronic ulcer of left calf limited to breakdown of skin I87.332 Chronic venous hypertension (idiopathic) with ulcer  and inflammation of left lower extremity Electronic Signature(s) Signed: 04/02/2019 6:06:19 PM By: Linton Ham MD Entered By: Linton Ham on 04/02/2019 18:05:45

## 2019-04-11 ENCOUNTER — Encounter (INDEPENDENT_AMBULATORY_CARE_PROVIDER_SITE_OTHER): Payer: Self-pay | Admitting: Vascular Surgery

## 2019-04-14 ENCOUNTER — Other Ambulatory Visit: Payer: Self-pay | Admitting: Internal Medicine

## 2019-04-17 DIAGNOSIS — C4492 Squamous cell carcinoma of skin, unspecified: Secondary | ICD-10-CM

## 2019-04-17 HISTORY — DX: Squamous cell carcinoma of skin, unspecified: C44.92

## 2019-04-23 ENCOUNTER — Ambulatory Visit: Payer: Medicare Other | Admitting: Internal Medicine

## 2019-06-09 ENCOUNTER — Other Ambulatory Visit (INDEPENDENT_AMBULATORY_CARE_PROVIDER_SITE_OTHER): Payer: Self-pay | Admitting: Vascular Surgery

## 2019-06-18 ENCOUNTER — Other Ambulatory Visit: Payer: Self-pay | Admitting: Internal Medicine

## 2019-07-10 ENCOUNTER — Other Ambulatory Visit: Payer: Self-pay | Admitting: Internal Medicine

## 2019-07-14 ENCOUNTER — Ambulatory Visit (INDEPENDENT_AMBULATORY_CARE_PROVIDER_SITE_OTHER): Payer: Medicare Other

## 2019-07-14 ENCOUNTER — Other Ambulatory Visit: Payer: Self-pay

## 2019-07-14 ENCOUNTER — Ambulatory Visit (INDEPENDENT_AMBULATORY_CARE_PROVIDER_SITE_OTHER): Payer: Medicare Other | Admitting: Vascular Surgery

## 2019-07-14 ENCOUNTER — Encounter (INDEPENDENT_AMBULATORY_CARE_PROVIDER_SITE_OTHER): Payer: Self-pay | Admitting: Vascular Surgery

## 2019-07-14 ENCOUNTER — Encounter (INDEPENDENT_AMBULATORY_CARE_PROVIDER_SITE_OTHER): Payer: Self-pay

## 2019-07-14 VITALS — BP 196/67 | HR 76 | Resp 16 | Wt 117.0 lb

## 2019-07-14 DIAGNOSIS — I10 Essential (primary) hypertension: Secondary | ICD-10-CM | POA: Diagnosis not present

## 2019-07-14 DIAGNOSIS — I7025 Atherosclerosis of native arteries of other extremities with ulceration: Secondary | ICD-10-CM | POA: Diagnosis not present

## 2019-07-14 DIAGNOSIS — E782 Mixed hyperlipidemia: Secondary | ICD-10-CM

## 2019-07-14 DIAGNOSIS — I872 Venous insufficiency (chronic) (peripheral): Secondary | ICD-10-CM | POA: Diagnosis not present

## 2019-07-14 DIAGNOSIS — I480 Paroxysmal atrial fibrillation: Secondary | ICD-10-CM | POA: Diagnosis not present

## 2019-07-14 NOTE — Progress Notes (Signed)
MRN : [id number]  Deanna White is a 85 y.o. (Oct 09, 1924) female who presents with chief complaint of No chief complaint on file. Marland Kitchen  History of Present Illness:   The patient returns to the office for followup and review status post angiogram with intervention on 03/14/2019.  Procedure(s) Performed: 1. Introduction catheter intoleftlower extremity 3rd order catheter placement  2.Contrast injection leftlower extremity for distal runoff  3. Crosser atherectomy of theleft anteriorartery 4. Percutaneous transluminal angioplastyleft anteriorartery with a 2 mm x 30 cm Ultraverseballoon 5. Percutaneous transluminal angioplasty of the SFA and popliteal arteries with a5 mm x 60 mm Lutonixballoon 6. Star close closurerightcommon femoral arteriotomy   The patient notes improvement in the lower extremity symptoms. No interval shortening of the patient's claudication distance or rest pain symptoms. Previous wounds have now healed.  No new ulcers or wounds have occurred since the last visit.  There have been no significant changes to the patient's overall health care.  The patient denies amaurosis fugax or recent TIA symptoms. There are no recent neurological changes noted. The patient denies history of DVT, PE or superficial thrombophlebitis. The patient denies recent episodes of angina or shortness of breath.   ABI Rt=0.74 and Lt=0.94 (Previous Rt=0.71 and Lt=[place])  No outpatient medications have been marked as taking for the 07/14/19 encounter (Appointment) with Delana Meyer, Dolores Lory, MD.    Past Medical History:  Diagnosis Date  . Allergy   . Carotid artery occlusion   . Colon polyps   . Fall from slipping on ice Feb. 2015  . Heart murmur   . Hypertension   . Menopause   . Pneumonia   . Positive TB test    pt denies  . Sore throat   . Thyroid disease   . Varicose veins     Past  Surgical History:  Procedure Laterality Date  . ABDOMINAL HYSTERECTOMY    . APPENDECTOMY    . BILATERAL SALPINGOOPHORECTOMY  1964  . CESAREAN SECTION    . EYE SURGERY Right March 2016   Cataract  . LOWER EXTREMITY ANGIOGRAPHY Left 03/14/2019   Procedure: LOWER EXTREMITY ANGIOGRAPHY;  Surgeon: Katha Cabal, MD;  Location: [facility];  Service: Cardiovascular;  Laterality: Left;  . TONSILLECTOMY      Social History Social History   Tobacco Use  . Smoking status: Never Smoker  . Smokeless tobacco: Never Used  Substance Use Topics  . Alcohol use: No  . Drug use: No    Family History Family History  Problem Relation Age of Onset  . Heart disease Mother        After age 78  . Hypertension Mother   . Deep vein thrombosis Mother   . Heart attack Mother   . Kidney disease Father   . Alcohol abuse Brother   . Heart disease Brother   . Cancer Brother        Lung cancer  . Cancer Brother        kidney cancer  . Varicose Veins Sister   . Heart disease Sister        After age 74  . Heart attack Sister   . Heart disease Sister        After age 32  . Other Other        epilepsy  . Hypertension Other   . Cancer Paternal Grandmother        breast cancer    Allergies  Allergen Reactions  . Adhesive [Tape]  Rash    Peels skin  . Brimonidine Rash and Other (See Comments)    Pt felt as if throat was closing up, pt also had rash     REVIEW OF SYSTEMS (Negative unless checked)  Constitutional: [] Weight loss  [] Fever  [] Chills Cardiac: [] Chest pain   [] Chest pressure   [] Palpitations   [] Shortness of breath when laying flat   [] Shortness of breath with exertion. Vascular:  [] Pain in legs with walking   [] Pain in legs at rest  [] History of DVT   [] Phlebitis   [] Swelling in legs   [] Varicose veins   [x] Non-healing ulcers Pulmonary:   [] Uses home oxygen   [] Productive cough   [] Hemoptysis   [] Wheeze  [] COPD   [] Asthma Neurologic:  [] Dizziness   [] Seizures    [] History of stroke   [] History of TIA  [] Aphasia   [] Vissual changes   [] Weakness or numbness in arm   [] Weakness or numbness in leg Musculoskeletal:   [] Joint swelling   [] Joint pain   [] Low back pain Hematologic:  [] Easy bruising  [] Easy bleeding   [] Hypercoagulable state   [] Anemic Gastrointestinal:  [] Diarrhea   [] Vomiting  [] Gastroesophageal reflux/heartburn   [] Difficulty swallowing. Genitourinary:  [] Chronic kidney disease   [] Difficult urination  [] Frequent urination   [] Blood in urine Skin:  [] Rashes   [] Ulcers  Psychological:  [] History of anxiety   []  History of major depression.  Physical Examination  There were no vitals filed for this visit. There is no height or weight on file to calculate BMI. Gen: WD/WN, NAD Head: [place]/AT, No temporalis wasting.  Ear/Nose/Throat: Hearing grossly intact, nares w/o erythema or drainage Eyes: PER, EOMI, sclera nonicteric.  Neck: Supple, no large masses.   Pulmonary:  Good air movement, no audible wheezing bilaterally, no use of accessory muscles.  Cardiac: RRR, no JVD Vascular:  Left calf wound healed left ankle ulcer healing;  Vessel Right Left  PT Not Palpable Not Palpable  DP Not Palpable Not Palpable  Gastrointestinal: Non-distended. No guarding/no peritoneal signs.  Musculoskeletal: M/S 5/5 throughout.  No deformity or atrophy.  Neurologic: CN 2-12 intact. Symmetrical.  Speech is fluent. Motor exam as listed above. Psychiatric: Judgment intact, Mood & affect appropriate for pt's clinical situation. Dermatologic: No rashes or ulcers noted.  No changes consistent with cellulitis. Lymph : No lichenification or skin changes of chronic lymphedema.  CBC Lab Results  Component Value Date   WBC 6.9 09/17/2018   HGB 11.2 (L) 09/17/2018   HCT 33.2 (L) 09/17/2018   MCV 95.2 09/17/2018   PLT 235.0 09/17/2018    BMET    Component Value Date/Time   NA 137 12/04/2018   K 4.2 12/04/2018   CL 99 09/23/2018 1056   CO2 25 09/23/2018 1056    GLUCOSE 116 (H) 09/23/2018 1056   BUN 18 03/13/2019 1138   BUN 16 12/04/2018   CREATININE 0.90 03/13/2019 1138   CALCIUM 8.6 09/23/2018 1056   GFRNONAA 56 (L) 03/13/2019 1138   GFRAA >60 03/13/2019 1138   CrCl cannot be calculated (Patient's most recent lab result is older than the maximum 21 days allowed.).  COAG No results found for: INR, PROTIME  Radiology No results found.   Assessment/Plan 1. Atherosclerosis of native arteries of the extremities with ulceration ([place]) Recommend:  The patient is status post successful angiogram with intervention.  The patient reports that the claudication symptoms and leg pain is essentially gone.   The patient denies lifestyle limiting changes at this point in time.  No further invasive studies, angiography or surgery at this time The patient should continue walking and begin a more formal exercise program.  The patient should continue antiplatelet therapy and aggressive treatment of the lipid abnormalities  Smoking cessation was again discussed  The patient should continue wearing graduated compression socks 10-15 mmHg strength to control the mild edema.  Patient should undergo noninvasive studies as ordered. The patient will follow up with me after the studies.   - VAS [place] ABI WITH/WO TBI; Future  2. Venous insufficiency of both lower extremities No surgery or intervention at this point in time.    I have had a long discussion with the patient regarding venous insufficiency and why it  causes symptoms. I have discussed with the patient the chronic skin changes that accompany venous insufficiency and the long term sequela such as infection and ulceration.  Patient will begin wearing graduated compression stockings class 1 (20-30 mmHg) or compression wraps on a daily basis a prescription was given. The patient will put the stockings on first thing in the morning and removing them in the evening. The patient is instructed specifically not  to sleep in the stockings.    In addition, behavioral modification including several periods of elevation of the lower extremities during the day will be continued. I have demonstrated that proper elevation is a position with the ankles at heart level.  The patient is instructed to begin routine exercise, especially walking on a daily basis  3. Benign essential hypertension Continue antihypertensive medications as already ordered, these medications have been reviewed and there are no changes at this time.   4. Paroxysmal A-fib (HCC) Continue antiarrhythmia medications as already ordered, these medications have been reviewed and there are no changes at this time.  Continue anticoagulation as ordered by Cardiology Service   5. Mixed hyperlipidemia Continue statin as ordered and reviewed, no changes at this time    Hortencia Pilar, MD  07/14/2019 10:20 AM

## 2019-07-21 ENCOUNTER — Other Ambulatory Visit: Payer: Self-pay | Admitting: Internal Medicine

## 2019-07-31 ENCOUNTER — Other Ambulatory Visit: Payer: Self-pay | Admitting: Internal Medicine

## 2019-08-04 ENCOUNTER — Ambulatory Visit (INDEPENDENT_AMBULATORY_CARE_PROVIDER_SITE_OTHER): Payer: Medicare Other | Admitting: Family Medicine

## 2019-08-04 ENCOUNTER — Telehealth: Payer: Self-pay | Admitting: *Deleted

## 2019-08-04 ENCOUNTER — Other Ambulatory Visit: Payer: Self-pay

## 2019-08-04 ENCOUNTER — Encounter: Payer: Self-pay | Admitting: Family Medicine

## 2019-08-04 VITALS — BP 142/68 | Ht 62.0 in | Wt 117.0 lb

## 2019-08-04 DIAGNOSIS — B029 Zoster without complications: Secondary | ICD-10-CM

## 2019-08-04 MED ORDER — VALACYCLOVIR HCL 1 G PO TABS: 1000.0000 mg | ORAL_TABLET | Freq: Three times a day (TID) | ORAL | 0 refills | Status: DC

## 2019-08-04 NOTE — Telephone Encounter (Signed)
Copied from [place] [phone]. Topic: General - Other >> Aug 04, 2019  8:23 AM Carolyn Stare wrote:  Pt said she think she has shingles and would like a call back

## 2019-08-04 NOTE — Progress Notes (Signed)
[name], female   DOB: [birth year], 85 y.o.   MRN: [id number]    Virtual Visit via phone Note  This visit type was conducted due to national recommendations for restrictions regarding the COVID-19 pandemic (e.g. social distancing).  This format is felt to be most appropriate for this patient at this time.  All issues noted in this document were discussed and addressed.  No physical exam was performed (except for noted visual exam findings with Video Visits).   I connected with Kelton Pillar today at 10:20 AM EST by a video enabled telemedicine application or telephone and verified that I am speaking with the correct person using two identifiers. Location patient: home Location provider: work or home office Persons participating in the virtual visit: patient, provider  I discussed the limitations, risks, security and privacy concerns of performing an evaluation and management service by phone and the availability of in person appointments. I also discussed with the patient that there may be a patient responsible charge related to this service. The patient expressed understanding and agreed to proceed.   HPI:  Patient and I connected via telephone due to reports of pain and rash on left flank that she suspects might be shingles.  States it first began as the pain in left leg and then noticed a red spot 2 days ago, now rash area seems to have spread.  Had family member look at area, and they thought possible shingles as well.  Has been using Tylenol with some effect in reducing pain.  No fever or chills.  Denies any oozing from the rash area.  ROS: See pertinent positives and negatives per HPI.  Past Medical History:  Diagnosis Date  . Allergy   . Carotid artery occlusion   . Colon polyps   . Fall from slipping on ice Feb. 2015  . Heart murmur   . Hypertension   . Menopause   . Pneumonia   . Positive TB test    pt denies  . Sore throat   . Thyroid disease   .  Varicose veins     Past Surgical History:  Procedure Laterality Date  . ABDOMINAL HYSTERECTOMY    . APPENDECTOMY    . BILATERAL SALPINGOOPHORECTOMY  1964  . CESAREAN SECTION    . EYE SURGERY Right March 2016   Cataract  . LOWER EXTREMITY ANGIOGRAPHY Left 03/14/2019   Procedure: LOWER EXTREMITY ANGIOGRAPHY;  Surgeon: Katha Cabal, MD;  Location: [facility];  Service: Cardiovascular;  Laterality: Left;  . TONSILLECTOMY      Family History  Problem Relation Age of Onset  . Heart disease Mother        After age 65  . Hypertension Mother   . Deep vein thrombosis Mother   . Heart attack Mother   . Kidney disease Father   . Alcohol abuse Brother   . Heart disease Brother   . Cancer Brother        Lung cancer  . Cancer Brother        kidney cancer  . Varicose Veins Sister   . Heart disease Sister        After age 60  . Heart attack Sister   . Heart disease Sister        After age 71  . Other Other        epilepsy  . Hypertension Other   . Cancer Paternal Grandmother        breast cancer  Social History   Tobacco Use  . Smoking status: Never Smoker  . Smokeless tobacco: Never Used  Substance Use Topics  . Alcohol use: No     Current Outpatient Medications:  .  amLODipine (NORVASC) 5 MG tablet, TAKE 1 TABLET BY MOUTH EVERY DAY, Disp: 90 tablet, Rfl: 1 .  aspirin EC 81 MG tablet, Take 81 mg by mouth at bedtime. , Disp: , Rfl:  .  cloNIDine (CATAPRES) 0.1 MG tablet, TAKE 1 TABLET (0.1 MG TOTAL) BY MOUTH 2 (TWO) TIMES DAILY., Disp: 180 tablet, Rfl: 3 .  clopidogrel (PLAVIX) 75 MG tablet, TAKE 1 TABLET BY MOUTH EVERY DAY, Disp: 90 tablet, Rfl: 1 .  dorzolamide (TRUSOPT) 2 % ophthalmic solution, Place 1 drop into both eyes 3 (three) times daily. , Disp: , Rfl: 12 .  hydrochlorothiazide (HYDRODIURIL) 25 MG tablet, TAKE 1 TABLET BY MOUTH EVERY DAY, Disp: 90 tablet, Rfl: 1 .  levothyroxine (SYNTHROID) 75 MCG tablet, TAKE 1 TABLET BY MOUTH EVERY DAY, Disp: 90  tablet, Rfl: 1 .  losartan (COZAAR) 100 MG tablet, TAKE 1 TABLET BY MOUTH EVERY DAY, Disp: 90 tablet, Rfl: 1 .  Multiple Vitamins-Minerals (PRESERVISION AREDS 2) CAPS, Take 1 capsule by mouth 2 (two) times daily. , Disp: , Rfl:  .  torsemide (DEMADEX) 5 MG tablet, 1 tablet by mouth daily PRN leg swelling., Disp: 30 tablet, Rfl: 5 .  triamcinolone cream (KENALOG) 0.1 %, Apply 1 application topically 2 (two) times daily. To left ankle, Disp: 30 g, Rfl: 0  EXAM:  GENERAL: alert, oriented, sounds well and in no acute distress  LUNGS: Speaking in full sentences. No signs of respiratory distress, breathing rate appears normal, no obvious gross SOB, gasping or wheezing  PSYCH/NEURO: pleasant and cooperative, no obvious depression or anxiety, speech and thought processing grossly intact  ASSESSMENT AND PLAN:  Discussed the following assessment and plan:  Herpes zoster without complication - Plan: valACYclovir (VALTREX) 1000 MG tablet  Area patient describing does sound like shingles.  We will treat with Valtrex 3 times daily for 7 days.  Advised patient that she can use Tylenol as needed to help reduce pain.  Advised the area is not seeming to improve over the next few days to 1 week to let us know and at that point we will recommend she either do a video visit or come into office for in person evaluation.  Patient understands the limitations of doing visit over telephone and agrees to coming into office and/or doing video call if needed.    I discussed the assessment and treatment plan with the patient. The patient was provided an opportunity to ask questions and all were answered. The patient agreed with the plan and demonstrated an understanding of the instructions.   The patient was advised to call back or seek an in-person evaluation if the symptoms worsen or if the condition fails to improve as anticipated.  I provided 13 minutes of non-face-to-face time during this encounter.   Jodelle Green, FNP

## 2019-08-04 NOTE — Telephone Encounter (Signed)
Pt is scheduled for a telephone visit with Lauren today at 10:30am.

## 2019-08-11 ENCOUNTER — Telehealth: Payer: Self-pay | Admitting: Internal Medicine

## 2019-08-11 DIAGNOSIS — B029 Zoster without complications: Secondary | ICD-10-CM

## 2019-08-11 MED ORDER — VALACYCLOVIR HCL 1 G PO TABS: 1000.0000 mg | ORAL_TABLET | Freq: Three times a day (TID) | ORAL | 0 refills | Status: DC

## 2019-08-11 NOTE — Telephone Encounter (Signed)
Patient states shingles have not improved on thed back ide of upper leg and buttocks, shingles have improved slightly, rash is still present, pain still present, patient requesting valACYclovir (VALTREX) 1000 MG tablet . Patient last seen by Philis Nettle 08/04/2019   [id number] Lorina Rabon, [place] - [address]

## 2019-08-11 NOTE — Telephone Encounter (Signed)
valcyclovir refilled

## 2019-09-24 ENCOUNTER — Ambulatory Visit (INDEPENDENT_AMBULATORY_CARE_PROVIDER_SITE_OTHER): Payer: Medicare Other

## 2019-09-24 ENCOUNTER — Encounter: Payer: Self-pay | Admitting: Internal Medicine

## 2019-09-24 ENCOUNTER — Encounter (INDEPENDENT_AMBULATORY_CARE_PROVIDER_SITE_OTHER): Payer: Self-pay

## 2019-09-24 ENCOUNTER — Other Ambulatory Visit: Payer: Self-pay

## 2019-09-24 ENCOUNTER — Ambulatory Visit (INDEPENDENT_AMBULATORY_CARE_PROVIDER_SITE_OTHER): Payer: Medicare Other | Admitting: Internal Medicine

## 2019-09-24 VITALS — Ht 62.0 in | Wt 118.0 lb

## 2019-09-24 DIAGNOSIS — B029 Zoster without complications: Secondary | ICD-10-CM | POA: Diagnosis not present

## 2019-09-24 DIAGNOSIS — Z0000 Encounter for general adult medical examination without abnormal findings: Secondary | ICD-10-CM

## 2019-09-24 DIAGNOSIS — B019 Varicella without complication: Secondary | ICD-10-CM | POA: Diagnosis not present

## 2019-09-24 DIAGNOSIS — I7025 Atherosclerosis of native arteries of other extremities with ulceration: Secondary | ICD-10-CM | POA: Diagnosis not present

## 2019-09-24 DIAGNOSIS — I10 Essential (primary) hypertension: Secondary | ICD-10-CM | POA: Diagnosis not present

## 2019-09-24 NOTE — Assessment & Plan Note (Signed)
Left LE has healed from excision biopsy and pain has improved significantly

## 2019-09-24 NOTE — Progress Notes (Signed)
Telephone  Note  This visit type was conducted due to national recommendations for restrictions regarding the COVID-19 pandemic (e.g. social distancing).  This format is felt to be most appropriate for this patient at this time.  All issues noted in this document were discussed and addressed.  No physical exam was performed (except for noted visual exam findings with Video Visits).   I connected with@ on 09/24/19 at  9:30 AM EST by telephone and verified that I am speaking with the correct person using two identifiers. Location patient: home Location provider: work or home office Persons participating in the virtual visit: patient, provider  AVW done today priro to telephone visit   I discussed the limitations, risks, security and privacy concerns of performing an evaluation and management service by telephone and the availability of in person appointments. I also discussed with the patient that there may be a patient responsible charge related to this service. The patient expressed understanding and agreed to proceed.   Reason for visit: follow up  HPI:  85 yr old treated for varicella  zoster involving the left leg on Nov 2 with valcyclovir  X 7 days .  The rash has resolved,  She has a minimal amount of intermittent pain over the left ankle, at the site of the skin cancer excision in July .  Healing has been slow   Due to PAD  ( last procedure June )  Sleeping much better since the painful skin cancer was removed. From her leg. Appetite is good.  The patient has no signs or symptoms of COVID 19 infection (fever, cough, sore throat  or shortness of breath beyond what is typical for patient).  Patient denies contact with other persons with the above mentioned symptoms or with anyone confirmed to have COVID 19 .  She lives alone and has been isolating.  Her daughter visits once a week with groceries, wears a mask when visits her. Very happy;  Watching Christmas Movies.,  Enjoying her christmas  tree  HTN:  Taking her medications as directed,  wthout side effects.  home readings on Blood pressure have been more stable and averaging 135/80      ROS: See pertinent positives and negatives per HPI.  Past Medical History:  Diagnosis Date  . Allergy   . Carotid artery occlusion   . Colon polyps   . Fall from slipping on ice Feb. 2015  . Heart murmur   . Hypertension   . Menopause   . Pneumonia   . Positive TB test    pt denies  . Sore throat   . Thyroid disease   . Varicose veins     Past Surgical History:  Procedure Laterality Date  . ABDOMINAL HYSTERECTOMY    . APPENDECTOMY    . BILATERAL SALPINGOOPHORECTOMY  1964  . CESAREAN SECTION    . EYE SURGERY Right March 2016   Cataract  . LOWER EXTREMITY ANGIOGRAPHY Left 03/14/2019   Procedure: LOWER EXTREMITY ANGIOGRAPHY;  Surgeon: Katha Cabal, MD;  Location: [facility];  Service: Cardiovascular;  Laterality: Left;  . TONSILLECTOMY      Family History  Problem Relation Age of Onset  . Heart disease Mother        After age 104  . Hypertension Mother   . Deep vein thrombosis Mother   . Heart attack Mother   . Kidney disease Father   . Alcohol abuse Brother   . Heart disease Brother   . Cancer  Brother        Lung cancer  . Cancer Brother        kidney cancer  . Varicose Veins Sister   . Heart disease Sister        After age 54  . Heart attack Sister   . Heart disease Sister        After age 39  . Other Other        epilepsy  . Hypertension Other   . Cancer Paternal Grandmother        breast cancer    SOCIAL HX:  reports that she has never smoked. She has never used smokeless tobacco. She reports that she does not drink alcohol or use drugs. Lives alone,  Still driving.    Current Outpatient Medications:  .  amLODipine (NORVASC) 5 MG tablet, TAKE 1 TABLET BY MOUTH EVERY DAY (Patient taking differently: Take 10 mg by mouth daily. ), Disp: 90 tablet, Rfl: 1 .  aspirin EC 81 MG tablet, Take  81 mg by mouth at bedtime. , Disp: , Rfl:  .  cloNIDine (CATAPRES) 0.1 MG tablet, TAKE 1 TABLET (0.1 MG TOTAL) BY MOUTH 2 (TWO) TIMES DAILY., Disp: 180 tablet, Rfl: 3 .  clopidogrel (PLAVIX) 75 MG tablet, TAKE 1 TABLET BY MOUTH EVERY DAY, Disp: 90 tablet, Rfl: 1 .  dorzolamide (TRUSOPT) 2 % ophthalmic solution, Place 1 drop into both eyes 3 (three) times daily. , Disp: , Rfl: 12 .  hydrochlorothiazide (HYDRODIURIL) 25 MG tablet, TAKE 1 TABLET BY MOUTH EVERY DAY, Disp: 90 tablet, Rfl: 1 .  levothyroxine (SYNTHROID) 75 MCG tablet, TAKE 1 TABLET BY MOUTH EVERY DAY, Disp: 90 tablet, Rfl: 1 .  losartan (COZAAR) 100 MG tablet, TAKE 1 TABLET BY MOUTH EVERY DAY, Disp: 90 tablet, Rfl: 1 .  Multiple Vitamins-Minerals (PRESERVISION AREDS 2) CAPS, Take 1 capsule by mouth 2 (two) times daily. , Disp: , Rfl:  .  valACYclovir (VALTREX) 1000 MG tablet, Take 1 tablet (1,000 mg total) by mouth 3 (three) times daily., Disp: 21 tablet, Rfl: 0  EXAM:   General impression: alert, cooperative and articulate.  No signs of being in distress  Lungs: speech is fluent sentence length suggests that patient is not short of breath and not punctuated by cough, sneezing or sniffing. Marland Kitchen   Psych: affect normal.  speech is articulate and non pressured .  Denies suicidal thoughts   ASSESSMENT AND PLAN:  Discussed the following assessment and plan:  Benign essential hypertension  Atherosclerosis of native arteries of the extremities with ulceration (HCC)  Varicella zoster  Benign essential hypertension Well controlled on current regimen. Renal function stable, no changes today.  Atherosclerosis of native arteries of the extremities with ulceration (HCC) Left LE has healed from excision biopsy and pain has improved significantly   Varicella zoster Treated with  valacyclovir in early November for shingles affecting left LE>  Rash has resolved.     I discussed the assessment and treatment plan with the patient. The  patient was provided an opportunity to ask questions and all were answered. The patient agreed with the plan and demonstrated an understanding of the instructions.   The patient was advised to call back or seek an in-person evaluation if the symptoms worsen or if the condition fails to improve as anticipated.   I provided  25 minutes of non-face-to-face time during this encounter reviewing patient's current problems and recent  Office visits, vascular procedures/imaging studies, providing counseling on the above mentioned  problems , and coordination  of care . Crecencio Mc, MD

## 2019-09-24 NOTE — Assessment & Plan Note (Signed)
Well controlled on current regimen. Renal function stable, no changes today. 

## 2019-09-24 NOTE — Progress Notes (Addendum)
Subjective:   Deanna White is a 85 y.o. female who presents for Medicare Annual (Subsequent) preventive examination.  Review of Systems:  No ROS.  Medicare Wellness Virtual Visit.  Visual/audio telehealth visit, UTA vital signs.   Wt/Ht provided by patient. See social history for additional risk factors.   Cardiac Risk Factors include: advanced age (>61men, >55 women)     Objective:     Vitals: Ht 5\' 2"  (1.575 m)   Wt 118 lb (53.5 kg)   BMI 21.58 kg/m   Body mass index is 21.58 kg/m.  Advanced Directives 09/24/2019 09/17/2018 07/27/2018 11/11/2017 08/01/2017 10/15/2015 10/09/2014  Does Patient Have a Medical Advance Directive? Yes Yes No Yes No No No  Type of Paramedic of [place];Living will [place]  Does patient want to make changes to medical advance directive? No - Patient declined No - Patient declined - - - - -  Copy of [place] in Chart? No - copy requested No - copy requested - - - - -  Would patient like information on creating a medical advance directive? - - No - Patient declined - Yes (MAU/Ambulatory/Procedural Areas - Information given) - Yes - Educational materials given    Tobacco Social History   Tobacco Use  Smoking Status Never Smoker  Smokeless Tobacco Never Used     Counseling given: Not Answered   Clinical Intake:  Pre-visit preparation completed: Yes        Diabetes: No  How often do you need to have someone help you when you read instructions, pamphlets, or other written materials from your doctor or pharmacy?: 1 - Never  Interpreter Needed?: No     Past Medical History:  Diagnosis Date  . Allergy   . Carotid artery occlusion   . Colon polyps   . Fall from slipping on ice Feb. 2015  . Heart murmur   . Hypertension   . Menopause   . Pneumonia   . Positive TB test    pt denies  . Sore throat   . Thyroid disease   . Varicose veins    Past Surgical  History:  Procedure Laterality Date  . ABDOMINAL HYSTERECTOMY    . APPENDECTOMY    . BILATERAL SALPINGOOPHORECTOMY  1964  . CESAREAN SECTION    . EYE SURGERY Right March 2016   Cataract  . LOWER EXTREMITY ANGIOGRAPHY Left 03/14/2019   Procedure: LOWER EXTREMITY ANGIOGRAPHY;  Surgeon: Katha Cabal, MD;  Location: [facility];  Service: Cardiovascular;  Laterality: Left;  . TONSILLECTOMY     Family History  Problem Relation Age of Onset  . Heart disease Mother        After age 80  . Hypertension Mother   . Deep vein thrombosis Mother   . Heart attack Mother   . Kidney disease Father   . Alcohol abuse Brother   . Heart disease Brother   . Cancer Brother        Lung cancer  . Cancer Brother        kidney cancer  . Varicose Veins Sister   . Heart disease Sister        After age 85  . Heart attack Sister   . Heart disease Sister        After age 52  . Other Other        epilepsy  . Hypertension Other   . Cancer  Paternal Grandmother        breast cancer   Social History   Socioeconomic History  . Marital status: Widowed    Spouse name: Not on file  . Number of children: Not on file  . Years of education: Not on file  . Highest education level: Not on file  Occupational History  . Not on file  Tobacco Use  . Smoking status: Never Smoker  . Smokeless tobacco: Never Used  Substance and Sexual Activity  . Alcohol use: No  . Drug use: No  . Sexual activity: Never  Other Topics Concern  . Not on file  Social History Narrative  . Not on file   Social Determinants of Health   Financial Resource Strain: Low Risk   . Difficulty of Paying Living Expenses: Not hard at all  Food Insecurity: No Food Insecurity  . Worried About Charity fundraiser in the Last Year: Never true  . Ran Out of Food in the Last Year: Never true  Transportation Needs: No Transportation Needs  . Lack of Transportation (Medical): No  . Lack of Transportation (Non-Medical): No    Physical Activity:   . Days of Exercise per Week: Not on file  . Minutes of Exercise per Session: Not on file  Stress: No Stress Concern Present  . Feeling of Stress : Only a little  Social Connections: Unknown  . Frequency of Communication with Friends and Family: More than three times a week  . Frequency of Social Gatherings with Friends and Family: More than three times a week  . Attends Religious Services: Not on file  . Active Member of Clubs or Organizations: Not on file  . Attends Archivist Meetings: Not on file  . Marital Status: Not on file    Outpatient Encounter Medications as of 09/24/2019  Medication Sig  . amLODipine (NORVASC) 5 MG tablet TAKE 1 TABLET BY MOUTH EVERY DAY  . aspirin EC 81 MG tablet Take 81 mg by mouth at bedtime.   . cloNIDine (CATAPRES) 0.1 MG tablet TAKE 1 TABLET (0.1 MG TOTAL) BY MOUTH 2 (TWO) TIMES DAILY.  Marland Kitchen clopidogrel (PLAVIX) 75 MG tablet TAKE 1 TABLET BY MOUTH EVERY DAY  . dorzolamide (TRUSOPT) 2 % ophthalmic solution Place 1 drop into both eyes 3 (three) times daily.   . hydrochlorothiazide (HYDRODIURIL) 25 MG tablet TAKE 1 TABLET BY MOUTH EVERY DAY  . levothyroxine (SYNTHROID) 75 MCG tablet TAKE 1 TABLET BY MOUTH EVERY DAY  . losartan (COZAAR) 100 MG tablet TAKE 1 TABLET BY MOUTH EVERY DAY  . Multiple Vitamins-Minerals (PRESERVISION AREDS 2) CAPS Take 1 capsule by mouth 2 (two) times daily.   Marland Kitchen torsemide (DEMADEX) 5 MG tablet 1 tablet by mouth daily PRN leg swelling.  . triamcinolone cream (KENALOG) 0.1 % Apply 1 application topically 2 (two) times daily. To left ankle  . valACYclovir (VALTREX) 1000 MG tablet Take 1 tablet (1,000 mg total) by mouth 3 (three) times daily.   No facility-administered encounter medications on file as of 09/24/2019.    Activities of Daily Living In your present state of health, do you have any difficulty performing the following activities: 09/24/2019 03/14/2019  Hearing? Y N  Comment L ear hearing aid  -  Vision? N N  Difficulty concentrating or making decisions? N N  Walking or climbing stairs? N N  Dressing or bathing? N N  Doing errands, shopping? N -  Preparing Food and eating ? N -  Using the  Toilet? N -  In the past six months, have you accidently leaked urine? N -  Do you have problems with loss of bowel control? N -  Managing your Medications? N -  Managing your Finances? N -  Housekeeping or managing your Housekeeping? N -  Some recent data might be hidden    Patient Care Team: Crecencio Mc, MD as PCP - General (Internal Medicine) Corey Skains, MD as Consulting Physician (Cardiology)    Assessment:   This is a routine wellness examination for Evee.  Nurse connected with patient 09/24/19 at  9:00 AM EST by a telephone enabled telemedicine application and verified that I am speaking with the correct person using two identifiers. Patient stated full name and DOB. Patient gave permission to continue with virtual visit. Patient's location was at home and Nurse's location was at [place] office.   Patient is alert and oriented x3. Patient denies difficulty focusing or concentrating. Patient likes to read, plays games, complete puzzles for brain stimulation.   Health Maintenance Due: -Eye Exam- scheduled in January 2021 See completed HM at the end of note.   Eye: Visual acuity not assessed. Virtual visit. Followed by their ophthalmologist.  Dental: Visits every 12 months.   Dentures- 2 partials.  Hearing: Demonstrates normal hearing during visit. Hearing aids- L ear  Safety:  Patient feels safe at home- yes Patient does have smoke detectors at home- yes Patient does wear sunscreen or protective clothing when in direct sunlight - yes Patient does wear seat belt when in a moving vehicle - yes Patient drives- yes Adequate lighting in walkways free from debris- yes Grab bars and handrails used as appropriate- yes Ambulates with an assistive device- no Cell  phone and Life line on person when ambulating inside/outside of the home- yes  Social: Alcohol intake - no  Smoking history- never   Smokers in home? none Illicit drug use? none  Medication: Taking as directed and without issues.  Pill box in use -yes  Self managed - yes   Covid-19: Precautions and sickness symptoms discussed. Wears mask, social distancing, hand hygiene as appropriate. Daughter assists with shopping.   Activities of Daily Living Patient denies needing assistance with: household chores, feeding themselves, getting from bed to chair, getting to the toilet, bathing/showering, dressing, managing money, or preparing meals.  Assisted by daughter as needed.   Discussed the importance of a healthy diet, water intake and the benefits of aerobic exercise.  Educational material provided.  Physical activity- active around the home, no routine  Diet:  Vegetarian  Water: good intake Caffeine: 2 cups of coffee, 1 cup of tea  Other Providers Patient Care Team: Crecencio Mc, MD as PCP - General (Internal Medicine) Corey Skains, MD as Consulting Physician (Cardiology)  Exercise Activities and Dietary recommendations    Goals    . Follow up with Primary Care Provider     As needed       Fall Risk Fall Risk  09/24/2019 09/17/2018 08/01/2017 03/01/2016 05/28/2014  Falls in the past year? 0 0 No No Yes  Number falls in past yr: - - - - 1  Injury with Fall? - - - - Yes  Risk Factor Category  - - - - -  Risk for fall due to : - - - - -  Follow up Falls prevention discussed - - - -   Timed Get Up and Go performed: no, virtual visit  Depression Screen PHQ 2/9 Scores 09/24/2019  09/17/2018 08/01/2017 03/01/2016  PHQ - 2 Score 0 0 0 0  PHQ- 9 Score - - 0 -     Cognitive Function     6CIT Screen 09/24/2019 09/17/2018 08/01/2017  What Year? 0 points 0 points 0 points  What month? 0 points 0 points 0 points  What time? 0 points 0 points 0 points  Count back  from 20 0 points 0 points 0 points  Months in reverse 0 points 0 points 0 points  Repeat phrase 2 points 0 points 0 points  Total Score 2 0 0    Immunization History  Administered Date(s) Administered  . Fluad Quad(high Dose 65+) 06/18/2019  . Influenza Split 07/01/2014  . Influenza-Unspecified 07/05/2016, 07/04/2017, 06/29/2018  . Pneumococcal Conjugate-13 02/20/2014  . Pneumococcal Polysaccharide-23 10/03/2003, 02/22/2015  . Td 10/02/2006  . Tdap 02/05/2018   Screening Tests Health Maintenance  Topic Date Due  . TETANUS/TDAP  02/06/2028  . INFLUENZA VACCINE  Completed  . DEXA SCAN  Completed  . PNA vac Low Risk Adult  Completed     Plan:   Keep all routine maintenance appointments.   Follow up with your doctor today @ [place] I have personally reviewed: The patient's medical and social history Their use of alcohol, tobacco or illicit drugs Their current medications and supplements The patient's functional ability including ADLs,fall risks, home safety risks, cognitive, and hearing and visual impairment Diet and physical activities Evidence for depression   I have reviewed and discussed with patient certain preventive protocols, quality metrics, and best practice recommendations.   OBrien-Blaney, Amay Mijangos L, LPN  [id number]    I have reviewed the above information and agree with above.   Deborra Medina, MD

## 2019-09-24 NOTE — Patient Instructions (Addendum)
  Deanna White , Thank you for taking time to come for your Medicare Wellness Visit. I appreciate your ongoing commitment to your health goals. Please review the following plan we discussed and let me know if I can assist you in the future.   These are the goals we discussed: Goals    . Follow up with Primary Care Provider     As needed       This is a list of the screening recommended for you and due dates:  Health Maintenance  Topic Date Due  . Tetanus Vaccine  02/06/2028  . Flu Shot  Completed  . DEXA scan (bone density measurement)  Completed  . Pneumonia vaccines  Completed

## 2019-09-24 NOTE — Assessment & Plan Note (Signed)
Treated with  valacyclovir in early November for shingles affecting left LE>  Rash has resolved.

## 2019-10-15 DIAGNOSIS — D485 Neoplasm of uncertain behavior of skin: Secondary | ICD-10-CM | POA: Diagnosis not present

## 2019-10-15 DIAGNOSIS — L905 Scar conditions and fibrosis of skin: Secondary | ICD-10-CM | POA: Diagnosis not present

## 2019-10-15 DIAGNOSIS — L988 Other specified disorders of the skin and subcutaneous tissue: Secondary | ICD-10-CM | POA: Diagnosis not present

## 2019-11-26 DIAGNOSIS — L820 Inflamed seborrheic keratosis: Secondary | ICD-10-CM | POA: Diagnosis not present

## 2019-11-26 DIAGNOSIS — L905 Scar conditions and fibrosis of skin: Secondary | ICD-10-CM | POA: Diagnosis not present

## 2019-11-26 DIAGNOSIS — Z85828 Personal history of other malignant neoplasm of skin: Secondary | ICD-10-CM | POA: Diagnosis not present

## 2019-12-08 ENCOUNTER — Other Ambulatory Visit (INDEPENDENT_AMBULATORY_CARE_PROVIDER_SITE_OTHER): Payer: Self-pay | Admitting: Vascular Surgery

## 2019-12-10 DIAGNOSIS — H469 Unspecified optic neuritis: Secondary | ICD-10-CM | POA: Diagnosis not present

## 2019-12-10 DIAGNOSIS — H353123 Nonexudative age-related macular degeneration, left eye, advanced atrophic without subfoveal involvement: Secondary | ICD-10-CM | POA: Diagnosis not present

## 2019-12-10 DIAGNOSIS — H40013 Open angle with borderline findings, low risk, bilateral: Secondary | ICD-10-CM | POA: Diagnosis not present

## 2019-12-10 DIAGNOSIS — H35443 Age-related reticular degeneration of retina, bilateral: Secondary | ICD-10-CM | POA: Diagnosis not present

## 2019-12-10 DIAGNOSIS — H353112 Nonexudative age-related macular degeneration, right eye, intermediate dry stage: Secondary | ICD-10-CM | POA: Diagnosis not present

## 2019-12-11 ENCOUNTER — Other Ambulatory Visit: Payer: Self-pay | Admitting: Internal Medicine

## 2019-12-16 DIAGNOSIS — H469 Unspecified optic neuritis: Secondary | ICD-10-CM | POA: Diagnosis not present

## 2019-12-16 DIAGNOSIS — H40013 Open angle with borderline findings, low risk, bilateral: Secondary | ICD-10-CM | POA: Diagnosis not present

## 2019-12-16 DIAGNOSIS — H25811 Combined forms of age-related cataract, right eye: Secondary | ICD-10-CM | POA: Diagnosis not present

## 2020-01-09 ENCOUNTER — Other Ambulatory Visit: Payer: Self-pay | Admitting: Internal Medicine

## 2020-01-15 ENCOUNTER — Ambulatory Visit (INDEPENDENT_AMBULATORY_CARE_PROVIDER_SITE_OTHER): Payer: Medicare Other | Admitting: Vascular Surgery

## 2020-01-15 ENCOUNTER — Other Ambulatory Visit: Payer: Self-pay

## 2020-01-15 ENCOUNTER — Encounter (INDEPENDENT_AMBULATORY_CARE_PROVIDER_SITE_OTHER): Payer: Self-pay | Admitting: Vascular Surgery

## 2020-01-15 ENCOUNTER — Ambulatory Visit (INDEPENDENT_AMBULATORY_CARE_PROVIDER_SITE_OTHER): Payer: Medicare PPO

## 2020-01-15 VITALS — BP 157/70 | HR 74 | Resp 16 | Ht 63.0 in | Wt 119.0 lb

## 2020-01-15 DIAGNOSIS — I480 Paroxysmal atrial fibrillation: Secondary | ICD-10-CM

## 2020-01-15 DIAGNOSIS — I10 Essential (primary) hypertension: Secondary | ICD-10-CM | POA: Diagnosis not present

## 2020-01-15 DIAGNOSIS — E782 Mixed hyperlipidemia: Secondary | ICD-10-CM

## 2020-01-15 DIAGNOSIS — I872 Venous insufficiency (chronic) (peripheral): Secondary | ICD-10-CM | POA: Diagnosis not present

## 2020-01-15 DIAGNOSIS — I7025 Atherosclerosis of native arteries of other extremities with ulceration: Secondary | ICD-10-CM | POA: Diagnosis not present

## 2020-01-15 MED ORDER — SILVER SULFADIAZINE 1 % EX CREA: 1.0000 "application " | TOPICAL_CREAM | Freq: Every day | CUTANEOUS | 0 refills | Status: DC

## 2020-01-17 ENCOUNTER — Other Ambulatory Visit: Payer: Self-pay | Admitting: Internal Medicine

## 2020-01-21 ENCOUNTER — Encounter (INDEPENDENT_AMBULATORY_CARE_PROVIDER_SITE_OTHER): Payer: Self-pay | Admitting: Vascular Surgery

## 2020-01-21 ENCOUNTER — Other Ambulatory Visit: Payer: Self-pay | Admitting: Internal Medicine

## 2020-01-21 NOTE — Progress Notes (Signed)
MRN : [id number]  Deanna White is a 86 y.o. (03/30/1925) female who presents with chief complaint of  Chief Complaint  Patient presents with  . Follow-up    6 mo abi   .  History of Present Illness:   The patient returns to the office for followup and review of the noninvasive studies. There has been a significant deterioration in the lower extremity symptoms.  The patient notes interval shortening of their claudication distance and development of mild rest pain symptoms. A nonhealling ulcers is present.  There have been no significant changes to the patient's overall health care.  The patient denies amaurosis fugax or recent TIA symptoms. There are no recent neurological changes noted. The patient denies history of DVT, PE or superficial thrombophlebitis. The patient denies recent episodes of angina or shortness of breath.   ABI's Rt=0.85 and Lt=0.99 (previous ABI's Rt=0.74 and Lt=0.93)   Current Meds  Medication Sig  . aspirin EC 81 MG tablet Take 81 mg by mouth at bedtime.   . cloNIDine (CATAPRES) 0.1 MG tablet TAKE 1 TABLET (0.1 MG TOTAL) BY MOUTH 2 (TWO) TIMES DAILY.  Marland Kitchen clopidogrel (PLAVIX) 75 MG tablet TAKE 1 TABLET BY MOUTH EVERY DAY  . dorzolamide (TRUSOPT) 2 % ophthalmic solution Place 1 drop into both eyes 3 (three) times daily.   Marland Kitchen levothyroxine (SYNTHROID) 75 MCG tablet TAKE 1 TABLET BY MOUTH EVERY DAY  . losartan (COZAAR) 100 MG tablet TAKE 1 TABLET BY MOUTH EVERY DAY  . Multiple Vitamins-Minerals (PRESERVISION AREDS 2) CAPS Take 1 capsule by mouth 2 (two) times daily.   . valACYclovir (VALTREX) 1000 MG tablet Take 1 tablet (1,000 mg total) by mouth 3 (three) times daily.  . [DISCONTINUED] amLODipine (NORVASC) 5 MG tablet TAKE 1 TABLET BY MOUTH EVERY DAY (Patient taking differently: Take 10 mg by mouth daily. )  . [DISCONTINUED] hydrochlorothiazide (HYDRODIURIL) 25 MG tablet TAKE 1 TABLET BY MOUTH EVERY DAY    Past Medical History:  Diagnosis Date  . Allergy    . Carotid artery occlusion   . Colon polyps   . Fall from slipping on ice Feb. 2015  . Heart murmur   . Hypertension   . Menopause   . Pneumonia   . Positive TB test    pt denies  . Sore throat   . Thyroid disease   . Varicose veins     Past Surgical History:  Procedure Laterality Date  . ABDOMINAL HYSTERECTOMY    . APPENDECTOMY    . BILATERAL SALPINGOOPHORECTOMY  1964  . CESAREAN SECTION    . EYE SURGERY Right March 2016   Cataract  . LOWER EXTREMITY ANGIOGRAPHY Left 03/14/2019   Procedure: LOWER EXTREMITY ANGIOGRAPHY;  Surgeon: Katha Cabal, MD;  Location: [facility];  Service: Cardiovascular;  Laterality: Left;  . TONSILLECTOMY      Social History Social History   Tobacco Use  . Smoking status: Never Smoker  . Smokeless tobacco: Never Used  Substance Use Topics  . Alcohol use: No  . Drug use: No    Family History Family History  Problem Relation Age of Onset  . Heart disease Mother        After age 20  . Hypertension Mother   . Deep vein thrombosis Mother   . Heart attack Mother   . Kidney disease Father   . Alcohol abuse Brother   . Heart disease Brother   . Cancer Brother  Lung cancer  . Cancer Brother        kidney cancer  . Varicose Veins Sister   . Heart disease Sister        After age 61  . Heart attack Sister   . Heart disease Sister        After age 93  . Other Other        epilepsy  . Hypertension Other   . Cancer Paternal Grandmother        breast cancer    Allergies  Allergen Reactions  . Adhesive [Tape] Rash    Peels skin  . Brimonidine Rash and Other (See Comments)    Pt felt as if throat was closing up, pt also had rash     REVIEW OF SYSTEMS (Negative unless checked)  Constitutional: [] Weight loss  [] Fever  [] Chills Cardiac: [] Chest pain   [] Chest pressure   [] Palpitations   [] Shortness of breath when laying flat   [] Shortness of breath with exertion. Vascular:  [x] Pain in legs with walking   [] Pain  in legs at rest  [] History of DVT   [] Phlebitis   [] Swelling in legs   [] Varicose veins   [x] Non-healing ulcers Pulmonary:   [] Uses home oxygen   [] Productive cough   [] Hemoptysis   [] Wheeze  [] COPD   [] Asthma Neurologic:  [] Dizziness   [] Seizures   [] History of stroke   [] History of TIA  [] Aphasia   [] Vissual changes   [] Weakness or numbness in arm   [] Weakness or numbness in leg Musculoskeletal:   [] Joint swelling   [] Joint pain   [] Low back pain Hematologic:  [] Easy bruising  [] Easy bleeding   [] Hypercoagulable state   [] Anemic Gastrointestinal:  [] Diarrhea   [] Vomiting  [] Gastroesophageal reflux/heartburn   [] Difficulty swallowing. Genitourinary:  [] Chronic kidney disease   [] Difficult urination  [] Frequent urination   [] Blood in urine Skin:  [] Rashes   [x] Ulcers  Psychological:  [] History of anxiety   []  History of major depression.  Physical Examination  Vitals:   01/15/20 1335  BP: (!) 157/70  Pulse: 74  Resp: 16  Weight: 119 lb (54 kg)  Height: 5\' 3"  (1.6 m)   Body mass index is 21.08 kg/m. Gen: WD/WN, NAD Head: [place]/AT, No temporalis wasting.  Ear/Nose/Throat: Hearing grossly intact, nares w/o erythema or drainage Eyes: PER, EOMI, sclera nonicteric.  Neck: Supple, no large masses.   Pulmonary:  Good air movement, no audible wheezing bilaterally, no use of accessory muscles.  Cardiac: RRR, no JVD Vascular: Ulcer left ankle noninfected Vessel Right Left  Radial Palpable Palpable  PT Not Palpable Not Palpable  DP Not Palpable Not Palpable  Gastrointestinal: Non-distended. No guarding/no peritoneal signs.  Musculoskeletal: M/S 5/5 throughout.  No deformity or atrophy.  Neurologic: CN 2-12 intact. Symmetrical.  Speech is fluent. Motor exam as listed above. Psychiatric: Judgment intact, Mood & affect appropriate for pt's clinical situation. Dermatologic: No rashes + ulcers noted.  No changes consistent with cellulitis.   CBC Lab Results  Component Value Date   WBC 6.9  09/17/2018   HGB 11.2 (L) 09/17/2018   HCT 33.2 (L) 09/17/2018   MCV 95.2 09/17/2018   PLT 235.0 09/17/2018    BMET    Component Value Date/Time   NA 137 12/04/2018 0000   K 4.2 12/04/2018 0000   CL 99 09/23/2018 1056   CO2 25 09/23/2018 1056   GLUCOSE 116 (H) 09/23/2018 1056   BUN 18 03/13/2019 1138   BUN 16 12/04/2018 0000  CREATININE 0.90 03/13/2019 1138   CALCIUM 8.6 09/23/2018 1056   GFRNONAA 56 (L) 03/13/2019 1138   GFRAA >60 03/13/2019 1138   CrCl cannot be calculated (Patient's most recent lab result is older than the maximum 21 days allowed.).  COAG No results found for: INR, PROTIME  Radiology No results found.   Assessment/Plan 1. Atherosclerosis of native arteries of the extremities with ulceration ([place]) Recommend:  Patient should undergo arterial duplex of the lower extremity ASAP because there has been a significant deterioration in the patient's lower extremity symptoms.  The patient states they are having increased pain and a marked decrease in the distance that they can walk.  The risks and benefits as well as the alternatives were discussed in detail with the patient.  All questions were answered.  Patient agrees to proceed and understands this could be a prelude to angiography and intervention.  The patient will follow up with me in the office to review the studies.   - VAS [place] LOWER EXTREMITY ARTERIAL DUPLEX; Future  2. Venous insufficiency of both lower extremities No surgery or intervention at this point in time.    I have had a long discussion with the patient regarding venous insufficiency and why it  causes symptoms. I have discussed with the patient the chronic skin changes that accompany venous insufficiency and the long term sequela such as infection and ulceration.  Patient will begin wearing graduated compression stockings class 1 (20-30 mmHg) or compression wraps on a daily basis a prescription was given. The patient will put the stockings  on first thing in the morning and removing them in the evening. The patient is instructed specifically not to sleep in the stockings.    In addition, behavioral modification including several periods of elevation of the lower extremities during the day will be continued. I have demonstrated that proper elevation is a position with the ankles at heart level.  The patient is instructed to begin routine exercise, especially walking on a daily basis  3. Paroxysmal A-fib (HCC) Continue antiarrhythmia medications as already ordered, these medications have been reviewed and there are no changes at this time.  Continue anticoagulation as ordered by Cardiology Service   4. Benign essential hypertension Continue antihypertensive medications as already ordered, these medications have been reviewed and there are no changes at this time.   5. Mixed hyperlipidemia Continue statin as ordered and reviewed, no changes at this time     Hortencia Pilar, MD  01/21/2020 3:33 PM

## 2020-02-02 ENCOUNTER — Other Ambulatory Visit: Payer: Self-pay

## 2020-02-02 ENCOUNTER — Encounter (INDEPENDENT_AMBULATORY_CARE_PROVIDER_SITE_OTHER): Payer: Self-pay | Admitting: Vascular Surgery

## 2020-02-02 ENCOUNTER — Ambulatory Visit (INDEPENDENT_AMBULATORY_CARE_PROVIDER_SITE_OTHER): Payer: Medicare PPO

## 2020-02-02 ENCOUNTER — Telehealth: Payer: Self-pay | Admitting: Cardiovascular Disease

## 2020-02-02 ENCOUNTER — Other Ambulatory Visit: Payer: Self-pay | Admitting: Cardiovascular Disease

## 2020-02-02 ENCOUNTER — Ambulatory Visit (INDEPENDENT_AMBULATORY_CARE_PROVIDER_SITE_OTHER): Payer: Medicare PPO | Admitting: Vascular Surgery

## 2020-02-02 VITALS — BP 168/76 | HR 67 | Ht 62.0 in | Wt 120.0 lb

## 2020-02-02 DIAGNOSIS — I872 Venous insufficiency (chronic) (peripheral): Secondary | ICD-10-CM | POA: Diagnosis not present

## 2020-02-02 DIAGNOSIS — I7025 Atherosclerosis of native arteries of other extremities with ulceration: Secondary | ICD-10-CM | POA: Diagnosis not present

## 2020-02-02 DIAGNOSIS — L97909 Non-pressure chronic ulcer of unspecified part of unspecified lower leg with unspecified severity: Secondary | ICD-10-CM

## 2020-02-02 DIAGNOSIS — I10 Essential (primary) hypertension: Secondary | ICD-10-CM

## 2020-02-02 DIAGNOSIS — I480 Paroxysmal atrial fibrillation: Secondary | ICD-10-CM

## 2020-02-02 MED ORDER — CLONIDINE HCL 0.1 MG PO TABS: 0.1000 mg | ORAL_TABLET | Freq: Two times a day (BID) | ORAL | 0 refills | Status: DC

## 2020-02-02 NOTE — Telephone Encounter (Signed)
*  STAT* If patient is at the pharmacy, call can be transferred to refill team.   1. Which medications need to be refilled? (please list name of each medication and dose if known)    Clonidine 0.1 mg po BID  2. Which pharmacy/location (including street and city if local pharmacy) is medication to be sent to? [facility] [address]   3. Do they need a 30 day or 90 day supply?  [place]

## 2020-02-02 NOTE — Telephone Encounter (Signed)
  Patient Consent for Virtual Visit         Deanna White has provided verbal consent on 02/02/2020 for a virtual visit (video or telephone).   CONSENT FOR VIRTUAL VISIT FOR:  Deanna White  By participating in this virtual visit I agree to the following:  I hereby voluntarily request, consent and authorize [facility] and its employed or contracted physicians, physician assistants, nurse practitioners or other licensed health care professionals (the Practitioner), to provide me with telemedicine health care services (the "Services") as deemed necessary by the treating Practitioner. I acknowledge and consent to receive the Services by the Practitioner via telemedicine. I understand that the telemedicine visit will involve communicating with the Practitioner through live audiovisual communication technology and the disclosure of certain medical information by electronic transmission. I acknowledge that I have been given the opportunity to request an in-person assessment or other available alternative prior to the telemedicine visit and am voluntarily participating in the telemedicine visit.  I understand that I have the right to withhold or withdraw my consent to the use of telemedicine in the course of my care at any time, without affecting my right to future care or treatment, and that the Practitioner or I may terminate the telemedicine visit at any time. I understand that I have the right to inspect all information obtained and/or recorded in the course of the telemedicine visit and may receive copies of available information for a reasonable fee.  I understand that some of the potential risks of receiving the Services via telemedicine include:  Marland Kitchen Delay or interruption in medical evaluation due to technological equipment failure or disruption; . Information transmitted may not be sufficient (e.g. poor resolution of images) to allow for appropriate medical decision making by the Practitioner;  and/or  . In rare instances, security protocols could fail, causing a breach of personal health information.  Furthermore, I acknowledge that it is my responsibility to provide information about my medical history, conditions and care that is complete and accurate to the best of my ability. I acknowledge that Practitioner's advice, recommendations, and/or decision may be based on factors not within their control, such as incomplete or inaccurate data provided by me or distortions of diagnostic images or specimens that may result from electronic transmissions. I understand that the practice of medicine is not an exact science and that Practitioner makes no warranties or guarantees regarding treatment outcomes. I acknowledge that a copy of this consent can be made available to me via my patient portal ([facility]), or I can request a printed copy by calling the office of [place].    I understand that my insurance will be billed for this visit.   I have read or had this consent read to me. . I understand the contents of this consent, which adequately explains the benefits and risks of the Services being provided via telemedicine.  . I have been provided ample opportunity to ask questions regarding this consent and the Services and have had my questions answered to my satisfaction. . I give my informed consent for the services to be provided through the use of telemedicine in my medical care

## 2020-02-02 NOTE — Telephone Encounter (Signed)
Requested Prescriptions   Signed Prescriptions Disp Refills   cloNIDine (CATAPRES) 0.1 MG tablet 180 tablet 0    Sig: Take 1 tablet (0.1 mg total) by mouth 2 (two) times daily.    Authorizing Provider: Minna Merritts    Ordering User: Raelene Bott, Elza Sortor L

## 2020-02-02 NOTE — Progress Notes (Signed)
MRN : [id number]  Deanna White is a 86 y.o. ([birth year]) female who presents with chief complaint of  Chief Complaint  Patient presents with  . Follow-up    U/S follow up  .  History of Present Illness:  The patient returns to the office for followup and review of the noninvasive studies. Today she notes her leg is better.  She denies rest pain symptoms. A nonhealling ulcers is present but she feels it is better.  There have been no significant changes to the patient's overall health care.  The patient denies amaurosis fugax or recent TIA symptoms. There are no recent neurological changes noted. The patient denies history of DVT, PE or superficial thrombophlebitis. The patient denies recent episodes of angina or shortness of breath.   ABI's (from the last visit) Rt=0.85 and Lt=0.99 (previous ABI's Rt=0.74 and Lt=0.93)  Arterial duplex left leg shows triphasic signals to the TP trunk  Current Meds  Medication Sig  . amLODipine (NORVASC) 5 MG tablet TAKE 1 TABLET BY MOUTH EVERY DAY  . aspirin EC 81 MG tablet Take 81 mg by mouth at bedtime.   . cloNIDine (CATAPRES) 0.1 MG tablet Take 1 tablet (0.1 mg total) by mouth 2 (two) times daily.  . clopidogrel (PLAVIX) 75 MG tablet TAKE 1 TABLET BY MOUTH EVERY DAY  . dorzolamide (TRUSOPT) 2 % ophthalmic solution Place 1 drop into both eyes 3 (three) times daily.   . hydrochlorothiazide (HYDRODIURIL) 25 MG tablet TAKE 1 TABLET BY MOUTH EVERY DAY  . levothyroxine (SYNTHROID) 75 MCG tablet TAKE 1 TABLET BY MOUTH EVERY DAY  . losartan (COZAAR) 100 MG tablet TAKE 1 TABLET BY MOUTH EVERY DAY  . Multiple Vitamins-Minerals (PRESERVISION AREDS 2) CAPS Take 1 capsule by mouth 2 (two) times daily.   . silver sulfADIAZINE (SILVADENE) 1 % cream Apply 1 application topically daily. Apply to left ankle wound twice perday  . valACYclovir (VALTREX) 1000 MG tablet Take 1 tablet (1,000 mg total) by mouth 3 (three) times daily.    Past Medical History:   Diagnosis Date  . Allergy   . Carotid artery occlusion   . Colon polyps   . Fall from slipping on ice Feb. 2015  . Heart murmur   . Hypertension   . Menopause   . Pneumonia   . Positive TB test    pt denies  . Sore throat   . Thyroid disease   . Varicose veins     Past Surgical History:  Procedure Laterality Date  . ABDOMINAL HYSTERECTOMY    . APPENDECTOMY    . BILATERAL SALPINGOOPHORECTOMY  1964  . CESAREAN SECTION    . EYE SURGERY Right March 2016   Cataract  . LOWER EXTREMITY ANGIOGRAPHY Left 03/14/2019   Procedure: LOWER EXTREMITY ANGIOGRAPHY;  Surgeon: Katha Cabal, MD;  Location: [facility];  Service: Cardiovascular;  Laterality: Left;  . TONSILLECTOMY      Social History Social History   Tobacco Use  . Smoking status: Never Smoker  . Smokeless tobacco: Never Used  Substance Use Topics  . Alcohol use: No  . Drug use: No    Family History Family History  Problem Relation Age of Onset  . Heart disease Mother        After age 62  . Hypertension Mother   . Deep vein thrombosis Mother   . Heart attack Mother   . Kidney disease Father   . Alcohol abuse Brother   . Heart  disease Brother   . Cancer Brother        Lung cancer  . Cancer Brother        kidney cancer  . Varicose Veins Sister   . Heart disease Sister        After age 96  . Heart attack Sister   . Heart disease Sister        After age 44  . Other Other        epilepsy  . Hypertension Other   . Cancer Paternal Grandmother        breast cancer    Allergies  Allergen Reactions  . Adhesive [Tape] Rash    Peels skin  . Brimonidine Rash and Other (See Comments)    Pt felt as if throat was closing up, pt also had rash     REVIEW OF SYSTEMS (Negative unless checked)  Constitutional: [] Weight loss  [] Fever  [] Chills Cardiac: [] Chest pain   [] Chest pressure   [] Palpitations   [] Shortness of breath when laying flat   [] Shortness of breath with exertion. Vascular:  [x] Pain  in legs with walking   [] Pain in legs at rest  [] History of DVT   [] Phlebitis   [x] Swelling in legs   [] Varicose veins   [x] Non-healing ulcers Pulmonary:   [] Uses home oxygen   [] Productive cough   [] Hemoptysis   [] Wheeze  [] COPD   [] Asthma Neurologic:  [] Dizziness   [] Seizures   [] History of stroke   [] History of TIA  [] Aphasia   [] Vissual changes   [] Weakness or numbness in arm   [] Weakness or numbness in leg Musculoskeletal:   [] Joint swelling   [] Joint pain   [] Low back pain Hematologic:  [] Easy bruising  [] Easy bleeding   [] Hypercoagulable state   [] Anemic Gastrointestinal:  [] Diarrhea   [] Vomiting  [] Gastroesophageal reflux/heartburn   [] Difficulty swallowing. Genitourinary:  [] Chronic kidney disease   [] Difficult urination  [] Frequent urination   [] Blood in urine Skin:  [x] Rashes   [x] Ulcers  Psychological:  [] History of anxiety   []  History of major depression.  Physical Examination  Vitals:   02/02/20 1108  BP: (!) 168/76  Pulse: 67  Weight: 120 lb (54.4 kg)  Height: 5\' 2"  (1.575 m)   Body mass index is 21.95 kg/m. Gen: WD/WN, NAD Head: Walker/AT, No temporalis wasting.  Ear/Nose/Throat: Hearing grossly intact, nares w/o erythema or drainage Eyes: PER, EOMI, sclera nonicteric.  Neck: Supple, no large masses.   Pulmonary:  Good air movement, no audible wheezing bilaterally, no use of accessory muscles.  Cardiac: RRR, no JVD Vascular: scattered varicosities present bilaterally.  Moderatevenous stasis changes to the legs bilaterally.  2+ soft pitting edema small ulcer lateral malleolus noninfected Vessel Right Left  Radial Palpable Palpable  PT Not Palpable Not Palpable  DP Not Palpable Not Palpable  Gastrointestinal: Non-distended. No guarding/no peritoneal signs.  Musculoskeletal: M/S 5/5 throughout.  No deformity or atrophy.  Neurologic: CN 2-12 intact. Symmetrical.  Speech is fluent. Motor exam as listed above. Psychiatric: Judgment intact, Mood & affect appropriate for pt's  clinical situation. Dermatologic: venous rashes no ulcers noted.  No changes consistent with cellulitis. Lymph : No lichenification or skin changes of chronic lymphedema.  CBC Lab Results  Component Value Date   WBC 6.9 09/17/2018   HGB 11.2 (L) 09/17/2018   HCT 33.2 (L) 09/17/2018   MCV 95.2 09/17/2018   PLT 235.0 09/17/2018    BMET    Component Value Date/Time   NA 137 12/04/2018 0000  K 4.2 12/04/2018 0000   CL 99 09/23/2018 1056   CO2 25 09/23/2018 1056   GLUCOSE 116 (H) 09/23/2018 1056   BUN 18 03/13/2019 1138   BUN 16 12/04/2018 0000   CREATININE 0.90 03/13/2019 1138   CALCIUM 8.6 09/23/2018 1056   GFRNONAA 56 (L) 03/13/2019 1138   GFRAA >60 03/13/2019 1138   CrCl cannot be calculated (Patient's most recent lab result is older than the maximum 21 days allowed.).  COAG No results found for: INR, PROTIME  Radiology VAS [place] ABI WITH/WO TBI  Result Date: 01/22/2020 LOWER EXTREMITY DOPPLER STUDY Indications: Ulceration, and Left lateral calf and ankle non-healing wounds              since December.  Comparison Study: 07/14/2019 Performing Technologist: Concha Norway RVT  Examination Guidelines: A complete evaluation includes at minimum, Doppler waveform signals and systolic blood pressure reading at the level of bilateral brachial, anterior tibial, and posterior tibial arteries, when vessel segments are accessible. Bilateral testing is considered an integral part of a complete examination. Photoelectric Plethysmograph (PPG) waveforms and toe systolic pressure readings are included as required and additional duplex testing as needed. Limited examinations for reoccurring indications may be performed as noted.  ABI Findings: +---------+------------------+-----+----------+--------+ Right    Rt Pressure (mmHg)IndexWaveform  Comment  +---------+------------------+-----+----------+--------+ Brachial 181                                        +---------+------------------+-----+----------+--------+ ATA      153               0.85 monophasic         +---------+------------------+-----+----------+--------+ PTA      136               0.75 biphasic           +---------+------------------+-----+----------+--------+ Great Toe78                0.43 Normal             +---------+------------------+-----+----------+--------+ +---------+------------------+-----+----------+-------+ Left     Lt Pressure (mmHg)IndexWaveform  Comment +---------+------------------+-----+----------+-------+ Brachial 180                                      +---------+------------------+-----+----------+-------+ ATA      174               0.96 monophasic        +---------+------------------+-----+----------+-------+ PTA      179               0.99 biphasic          +---------+------------------+-----+----------+-------+ Great Toe80                0.44 Normal            +---------+------------------+-----+----------+-------+ +-------+-----------+-----------+------------+------------+ ABI/TBIToday's ABIToday's TBIPrevious ABIPrevious TBI +-------+-----------+-----------+------------+------------+ Right  .85        .43        .74         .41          +-------+-----------+-----------+------------+------------+ Left   .99        .44        .93         .41          +-------+-----------+-----------+------------+------------+ Right ABIs appear increased  compared to prior study on 07/14/2019.  Summary: Right: Resting right ankle-brachial index indicates mild right lower extremity arterial disease. The right toe-brachial index is abnormal. Left: Resting left ankle-brachial index is within normal range. No evidence of significant left lower extremity arterial disease. The left toe-brachial index is abnormal.  *See table(s) above for measurements and observations.  Electronically signed by Hortencia Pilar MD on 01/22/2020 at 9:08:25  AM.   Final      Assessment/Plan 1. Atherosclerosis of native arteries of the extremities with ulceration (Agency)  Recommend:  The patient has evidence of atherosclerosis of the lower extremities with claudication.  The patient does not voice lifestyle limiting changes at this point in time.  Noninvasive studies do not suggest clinically significant change.  No invasive studies, angiography or surgery at this time The patient should continue walking and begin a more formal exercise program.  The patient should continue antiplatelet therapy and aggressive treatment of the lipid abnormalities  No changes in the patient's medications at this time  The patient should continue wearing graduated compression socks 10-15 mmHg strength to control the mild edema.   - VAS [place] ABI WITH/WO TBI; Future  2. Venous ulcer of lower leg without varicose veins (HCC) No surgery or intervention at this point in time.    I have had a long discussion with the patient regarding venous insufficiency and why it  causes symptoms. I have discussed with the patient the chronic skin changes that accompany venous insufficiency and the long term sequela such as infection and ulceration.  Patient will begin wearing graduated compression stockings class 1 (20-30 mmHg) or compression wraps on a daily basis a prescription was given. The patient will put the stockings on first thing in the morning and removing them in the evening. The patient is instructed specifically not to sleep in the stockings.    In addition, behavioral modification including several periods of elevation of the lower extremities during the day will be continued. I have demonstrated that proper elevation is a position with the ankles at heart level.  The patient is instructed to begin routine exercise, especially walking on a daily basis  Following the review of the ultrasound the patient will follow up in 2-3 months to reassess the degree of swelling  and the control that graduated compression stockings or compression wraps  is offering.   The patient can be assessed for a Lymph Pump at that time  3. Paroxysmal A-fib (HCC) Continue antiarrhythmia medications as already ordered, these medications have been reviewed and there are no changes at this time.  Continue anticoagulation as ordered by Cardiology Service   4. Benign essential hypertension Continue antihypertensive medications as already ordered, these medications have been reviewed and there are no changes at this time.    Hortencia Pilar, MD  02/02/2020 11:37 AM

## 2020-02-03 ENCOUNTER — Encounter (INDEPENDENT_AMBULATORY_CARE_PROVIDER_SITE_OTHER): Payer: Self-pay | Admitting: Vascular Surgery

## 2020-03-16 NOTE — Progress Notes (Signed)
Virtual Visit via Telephone Note   This visit type was conducted due to national recommendations for restrictions regarding the COVID-19 Pandemic (e.g. social distancing) in an effort to limit this patient's exposure and mitigate transmission in our community.  Due to her co-morbid illnesses, this patient is at least at moderate risk for complications without adequate follow up.  This format is felt to be most appropriate for this patient at this time.  The patient did not have access to video technology/had technical difficulties with video requiring transitioning to audio format only (telephone).  All issues noted in this document were discussed and addressed.  No physical exam could be performed with this format.  Please refer to the patient's chart for her  consent to telehealth for [facility].   I connected with  Deanna White on 03/17/20 by a video enabled telemedicine application and verified that I am speaking with the correct person using two identifiers. I discussed the limitations of evaluation and management by telemedicine. The patient expressed understanding and agreed to proceed.   Evaluation Performed:  Follow-up visit  Date:  03/17/2020   ID:  Deanna White, DOB Oct 09, 1924, MRN [id number]  Patient Location:  [place] [address]   Provider location:   [facility], [place] office  PCP:  Crecencio Mc, MD  Cardiologist:  Arvid Right [facility]   Chief Complaint  Patient presents with  . other    12 month f/u no complaints today. Meds reviewed verbally with pt.    History of Present Illness:    Deanna White is a 86 y.o. female who presents via audio/video conferencing for a telehealth visit today.   The patient does not symptoms concerning for COVID-19 infection (fever, chills, cough, or new SHORTNESS OF BREATH).   Patient has a past medical history of Svt/atrial fibrillation per Holter monitor read in 2017 by Dr. Louisa Second (results not  available for review) Holter monitor 2019 showing short runs atrial tachycardia Previous carotid studies demonstrated: <1-39% Hypertension Anemia Hyperlipidemia PAD,  -03/2019, Percutaneous transluminal angioplasty of the SFA and popliteal arteries with a 5 mm x 60 mm Lutonix balloon      --Percutaneous transluminal angioplasty left anterior artery with a 2 mm x 30 cm Ultraverse balloon Who presents atrial tachycardia, SVT  Last seen by telemetry visit june 2020 In person in the office in 2019  Seen by vascular in the office last month Studies reviewed ABI's (from the last visit) Rt=0.85and Lt=0.99(previous ABI's Rt=0.74and Lt=0.93) Arterial duplex left leg shows triphasic signals to the TP trunk  She has a nonhealing ulcer Had shingles, Cancer on leg  She reports that she is cutting her Plavix in half daily secondary to excessive bruising  268-341 systolic at home Rare palpitations No near syncope or syncope  Previous records reviewed No clear evidence of atrial fibrillation based on Holter review 2019 Previous Holter monitor has been requested from 2019 (no atrial fibrillation, only short runs of atrial tachycardia noted)  Currently not on anticoagulation    Back in early 2019 , syncopal episode while at church Taken to the Er Reports work-up was benign Had follow-up with cardiology [place],  Had echocardiogram stress test and Holter Was told the Holter showed atrial fibrillation Reading the notes indicates she had short runs of SVT but it was labeled atrial fibrillation  In the past had fatigue on metoprolol  In the ER 11/2017 For high blood pressure, did not feel  well Given hydralazine norvasc increased for discharge  Outpatient records reviewed Holter monitor June 2017 short runs of SVT consistent with paroxysmal atrial fibrillation Felt at that time to had CHADS VASC of 2 Not treated with anticoagulation  Carotid ultrasound minimal bilateral carotid  disease less than 39%  Echocardiogram ejection fraction of 92% grade 2 diastolic dysfunction moderate MR  Stress test July 2017   Prior CV studies:   The following studies were reviewed today:   Past Medical History:  Diagnosis Date  . Allergy   . Carotid artery occlusion   . Colon polyps   . Fall from slipping on ice Feb. 2015  . Heart murmur   . Hypertension   . Menopause   . Pneumonia   . Positive TB test    pt denies  . Sore throat   . Thyroid disease   . Varicose veins    Past Surgical History:  Procedure Laterality Date  . ABDOMINAL HYSTERECTOMY    . APPENDECTOMY    . BILATERAL SALPINGOOPHORECTOMY  1964  . CESAREAN SECTION    . EYE SURGERY Right March 2016   Cataract  . LOWER EXTREMITY ANGIOGRAPHY Left 03/14/2019   Procedure: LOWER EXTREMITY ANGIOGRAPHY;  Surgeon: Katha Cabal, MD;  Location: [facility];  Service: Cardiovascular;  Laterality: Left;  . TONSILLECTOMY    . VEIN SURGERY Left    Leg     Current Meds  Medication Sig  . amLODipine (NORVASC) 5 MG tablet TAKE 1 TABLET BY MOUTH EVERY DAY  . aspirin EC 81 MG tablet Take 81 mg by mouth at bedtime.   . cloNIDine (CATAPRES) 0.1 MG tablet Take 1 tablet (0.1 mg total) by mouth 2 (two) times daily.  . clopidogrel (PLAVIX) 75 MG tablet TAKE 1 TABLET BY MOUTH EVERY DAY  . dorzolamide (TRUSOPT) 2 % ophthalmic solution Place 1 drop into both eyes 3 (three) times daily.   . hydrochlorothiazide (HYDRODIURIL) 25 MG tablet TAKE 1 TABLET BY MOUTH EVERY DAY  . levothyroxine (SYNTHROID) 75 MCG tablet TAKE 1 TABLET BY MOUTH EVERY DAY  . losartan (COZAAR) 100 MG tablet TAKE 1 TABLET BY MOUTH EVERY DAY  . Multiple Vitamins-Minerals (PRESERVISION AREDS 2) CAPS Take 1 capsule by mouth 2 (two) times daily.      Allergies:   Adhesive [tape] and Brimonidine   Social History   Tobacco Use  . Smoking status: Never Smoker  . Smokeless tobacco: Never Used  Substance Use Topics  . Alcohol use: No  .  Drug use: No     Current Outpatient Medications on File Prior to Visit  Medication Sig Dispense Refill  . amLODipine (NORVASC) 5 MG tablet TAKE 1 TABLET BY MOUTH EVERY DAY 90 tablet 1  . aspirin EC 81 MG tablet Take 81 mg by mouth at bedtime.     . cloNIDine (CATAPRES) 0.1 MG tablet Take 1 tablet (0.1 mg total) by mouth 2 (two) times daily. 180 tablet 0  . clopidogrel (PLAVIX) 75 MG tablet TAKE 1 TABLET BY MOUTH EVERY DAY 90 tablet 1  . dorzolamide (TRUSOPT) 2 % ophthalmic solution Place 1 drop into both eyes 3 (three) times daily.   12  . hydrochlorothiazide (HYDRODIURIL) 25 MG tablet TAKE 1 TABLET BY MOUTH EVERY DAY 90 tablet 1  . levothyroxine (SYNTHROID) 75 MCG tablet TAKE 1 TABLET BY MOUTH EVERY DAY 90 tablet 1  . losartan (COZAAR) 100 MG tablet TAKE 1 TABLET BY MOUTH EVERY DAY 90 tablet 1  .  Multiple Vitamins-Minerals (PRESERVISION AREDS 2) CAPS Take 1 capsule by mouth 2 (two) times daily.      No current facility-administered medications on file prior to visit.     Family Hx: The patient's family history includes Alcohol abuse in her brother; Cancer in her brother, brother, and paternal grandmother; Deep vein thrombosis in her mother; Heart attack in her mother and sister; Heart disease in her brother, mother, sister, and sister; Hypertension in her mother and another family member; Kidney disease in her father; Other in an other family member; Varicose Veins in her sister.  ROS:   Please see the history of present illness.    Review of Systems  Constitutional: Negative.   HENT: Negative.   Respiratory: Negative.   Cardiovascular: Negative.   Gastrointestinal: Negative.   Musculoskeletal: Negative.   Neurological: Negative.   Psychiatric/Behavioral: Negative.   All other systems reviewed and are negative.    Labs/Other Tests and Data Reviewed:    Recent Labs: No results found for requested labs within last 8760 hours.   Recent Lipid Panel Lab Results  Component Value  Date/Time   CHOL 178 03/01/2016 11:21 AM   TRIG 73.0 03/01/2016 11:21 AM   HDL 64.20 03/01/2016 11:21 AM   CHOLHDL 3 03/01/2016 11:21 AM   LDLCALC 100 (H) 03/01/2016 11:21 AM   LDLDIRECT 76.0 08/31/2016 11:45 AM    Wt Readings from Last 3 Encounters:  03/17/20 118 lb (53.5 kg)  02/02/20 120 lb (54.4 kg)  01/15/20 119 lb (54 kg)     Exam:    Vital Signs: Vital signs may also be detailed in the HPI BP (!) 147/63 (BP Location: Right Arm, Patient Position: Sitting, Cuff Size: Normal)   Pulse 73   Ht 5\' 2"  (1.575 m)   Wt 118 lb (53.5 kg)   BMI 21.58 kg/m   Wt Readings from Last 3 Encounters:  03/17/20 118 lb (53.5 kg)  02/02/20 120 lb (54.4 kg)  01/15/20 119 lb (54 kg)   Temp Readings from Last 3 Encounters:  03/14/19 97.7 F (36.5 C) (Oral)  01/30/19 97.7 F (36.5 C) (Oral)  10/18/18 97.9 F (36.6 C) (Oral)   BP Readings from Last 3 Encounters:  03/17/20 (!) 147/63  02/02/20 (!) 168/76  01/15/20 (!) 157/70   Pulse Readings from Last 3 Encounters:  03/17/20 73  02/02/20 67  01/15/20 74    Well nourished, well developed female in no acute distress. Constitutional:  oriented to person, place, and time. No distress.    ASSESSMENT & PLAN:    Atherosclerosis of artery of extremity with ulceration (HCC) -  PAD LE, prior PTCA, on asa plavix Non-smoker, no diabetes She has follow-up with vascular surgery Needs new lipid panel  Paroxysmal atrial fibrillation (HCC) -  No clear documentation  prior Holter showing short runs atrial tachycardia Denies any tachycardia palpitations concerning for arrhythmia, currently not on beta-blockers or anticoagulation  Peripheral vascular disease ([place]) - Followed by vascular surgery Prior angioplasty on aspirin Plavix, having some bruising Recommend she talk with vascular whether she can hold aspirin and stay on Plavix for vice versa  Essential hypertension, benign - Reports blood pressure typically 353 systolic, does not feel  as well when it is 130 Suggest if it stays high she can take extra half doses of clonidine either as needed for on a regular basis  Anemia, unspecified type -  Needs repeat lab work  Near syncope - No recent symptoms Will avoid running blood pressure too  low  Stenosis of carotid artery, unspecified laterality - <39% b/l in 2019 Followed by vascular Currently not on a statin,, could consider once new labs available  Venous insufficiency of both lower extremities -  Suggested leg elevation, wraps if needed   COVID-19 Education: The signs and symptoms of COVID-19 were discussed with the patient and how to seek care for testing (follow up with PCP or arrange E-visit).  The importance of social distancing was discussed today.  Patient Risk:   After full review of this patients clinical status, I feel that they are at least moderate risk at this time.  Time:   Today, I have spent 25 minutes with the patient with telehealth technology discussing the cardiac and medical problems/diagnoses detailed above   10 min spent reviewing the chart prior to patient visit today   Medication Adjustments/Labs and Tests Ordered: Current medicines are reviewed at length with the patient today.  Concerns regarding medicines are outlined above.   Tests Ordered: No tests ordered   Medication Changes: No changes made   Disposition: Follow-up in 12 months   Signed, Ida Rogue, MD  03/17/2020 8:59 AM    [place] Office [address] [place] [address], [place], [place] [address]

## 2020-03-17 ENCOUNTER — Telehealth (INDEPENDENT_AMBULATORY_CARE_PROVIDER_SITE_OTHER): Payer: Medicare PPO | Admitting: Cardiovascular Disease

## 2020-03-17 ENCOUNTER — Encounter: Payer: Self-pay | Admitting: Cardiovascular Disease

## 2020-03-17 ENCOUNTER — Other Ambulatory Visit: Payer: Self-pay

## 2020-03-17 VITALS — BP 147/63 | HR 73 | Ht 62.0 in | Wt 118.0 lb

## 2020-03-17 DIAGNOSIS — L97909 Non-pressure chronic ulcer of unspecified part of unspecified lower leg with unspecified severity: Secondary | ICD-10-CM

## 2020-03-17 DIAGNOSIS — I480 Paroxysmal atrial fibrillation: Secondary | ICD-10-CM | POA: Diagnosis not present

## 2020-03-17 DIAGNOSIS — I10 Essential (primary) hypertension: Secondary | ICD-10-CM

## 2020-03-17 DIAGNOSIS — D649 Anemia, unspecified: Secondary | ICD-10-CM | POA: Diagnosis not present

## 2020-03-17 DIAGNOSIS — I739 Peripheral vascular disease, unspecified: Secondary | ICD-10-CM | POA: Diagnosis not present

## 2020-03-17 DIAGNOSIS — I6529 Occlusion and stenosis of unspecified carotid artery: Secondary | ICD-10-CM

## 2020-03-17 DIAGNOSIS — I70299 Other atherosclerosis of native arteries of extremities, unspecified extremity: Secondary | ICD-10-CM | POA: Diagnosis not present

## 2020-03-17 NOTE — Patient Instructions (Addendum)

## 2020-03-25 ENCOUNTER — Ambulatory Visit: Payer: Medicare PPO | Admitting: Dermatology

## 2020-03-25 ENCOUNTER — Other Ambulatory Visit: Payer: Self-pay

## 2020-03-25 DIAGNOSIS — L578 Other skin changes due to chronic exposure to nonionizing radiation: Secondary | ICD-10-CM

## 2020-03-25 DIAGNOSIS — Z85828 Personal history of other malignant neoplasm of skin: Secondary | ICD-10-CM

## 2020-03-25 DIAGNOSIS — L821 Other seborrheic keratosis: Secondary | ICD-10-CM

## 2020-03-25 DIAGNOSIS — D485 Neoplasm of uncertain behavior of skin: Secondary | ICD-10-CM | POA: Diagnosis not present

## 2020-03-25 DIAGNOSIS — L820 Inflamed seborrheic keratosis: Secondary | ICD-10-CM | POA: Diagnosis not present

## 2020-03-25 NOTE — Progress Notes (Signed)
   Follow-Up Visit   Subjective  Deanna White is a 86 y.o. female who presents for the following: lesion (on the back that itches and bleeds ) and recheck SCC site (on the L ankle ).  The following portions of the chart were reviewed this encounter and updated as appropriate:  Tobacco  Allergies  Meds  Problems  Med Hx  Surg Hx  Fam Hx     Review of Systems:  No other skin or systemic complaints except as noted in HPI or Assessment and Plan.  Objective  Well appearing patient in no apparent distress; mood and affect are within normal limits.  A focused examination was performed including the back and left ankle. Relevant physical exam findings are noted in the Assessment and Plan.  Objective  L mid back: 1.1 cm hyperkeratotic papule      Objective  L post lat ankle: 1.0 x 0.6 cm Crusted ulceration  Images     Assessment & Plan    Neoplasm of uncertain behavior of skin L mid back  Skin / nail biopsy Type of biopsy: tangential   Informed consent: discussed and consent obtained   Timeout: patient name, date of birth, surgical site, and procedure verified   Procedure prep:  Patient was prepped and draped in usual sterile fashion Prep type:  Isopropyl alcohol Anesthesia: the lesion was anesthetized in a standard fashion   Anesthetic:  1% lidocaine w/ epinephrine 1-100,000 buffered w/ 8.4% NaHCO3 Instrument used: flexible razor blade   Hemostasis achieved with: pressure, aluminum chloride and electrodesiccation   Outcome: patient tolerated procedure well   Post-procedure details: sterile dressing applied and wound care instructions given   Dressing type: bandage and petrolatum    Specimen 1 - Surgical pathology Differential Diagnosis: ISK vs SCC  Check Margins: No 1.1 cm hyperkeratotic papule  History of SCC (squamous cell carcinoma) of skin L post lat ankle Slowly healing with crusted ulceration remaining.  Re-biopsied recently to r/o persistent SCC, path  showed scar.   Seborrheic Keratoses - Stuck-on, waxy, tan-brown papules and plaques  - Discussed benign etiology and prognosis. - Observe - Call for any changes  Actinic Damage - diffuse scaly erythematous macules with underlying dyspigmentation - Recommend daily broad spectrum sunscreen SPF 30+ to sun-exposed areas, reapply every 2 hours as needed.  - Call for new or changing lesions.  History of Squamous Cell Carcinoma of the Skin - No evidence of recurrence today - No lymphadenopathy - Recommend regular full body skin exams - Recommend daily broad spectrum sunscreen SPF 30+ to sun-exposed areas, reapply every 2 hours as needed.  - Call if any new or changing lesions are noted between office visits  Follow up as scheduled in August 2021 for TBSE  I, Rudell Cobb, CMA, am acting as scribe for Sarina Ser, MD .  Documentation: I have reviewed the above documentation for accuracy and completeness, and I agree with the above.  Sarina Ser, MD

## 2020-03-25 NOTE — Patient Instructions (Signed)

## 2020-03-29 ENCOUNTER — Telehealth: Payer: Self-pay

## 2020-03-29 NOTE — Telephone Encounter (Signed)
Left message to advise patient biopsy was benign.

## 2020-03-29 NOTE — Telephone Encounter (Signed)
Discussed biopsy results with pt daughter Helene Kelp

## 2020-03-29 NOTE — Telephone Encounter (Signed)
-----   Message from Ralene Bathe, MD sent at 03/29/2020  9:53 AM EDT ----- Skin , left mid back SEBORRHEIC KERATOSIS, IRRITATED  Benign irritated keratosis

## 2020-04-01 ENCOUNTER — Encounter: Payer: Self-pay | Admitting: Dermatology

## 2020-04-06 ENCOUNTER — Other Ambulatory Visit: Payer: Self-pay | Admitting: Internal Medicine

## 2020-04-06 ENCOUNTER — Other Ambulatory Visit (INDEPENDENT_AMBULATORY_CARE_PROVIDER_SITE_OTHER): Payer: Self-pay | Admitting: Vascular Surgery

## 2020-04-08 ENCOUNTER — Telehealth: Payer: Self-pay | Admitting: Internal Medicine

## 2020-04-08 DIAGNOSIS — I10 Essential (primary) hypertension: Secondary | ICD-10-CM

## 2020-04-08 DIAGNOSIS — E782 Mixed hyperlipidemia: Secondary | ICD-10-CM

## 2020-04-08 DIAGNOSIS — E559 Vitamin D deficiency, unspecified: Secondary | ICD-10-CM

## 2020-04-08 DIAGNOSIS — E034 Atrophy of thyroid (acquired): Secondary | ICD-10-CM

## 2020-04-08 DIAGNOSIS — Z7902 Long term (current) use of antithrombotics/antiplatelets: Secondary | ICD-10-CM

## 2020-04-08 NOTE — Telephone Encounter (Signed)
Pt would like a call back about lab work that Dr Rockey Situ was going to ask to be drawn by PCP? Please advise

## 2020-04-13 ENCOUNTER — Ambulatory Visit: Payer: Medicare Other | Admitting: Cardiovascular Disease

## 2020-04-15 NOTE — Telephone Encounter (Signed)
LMTCB

## 2020-04-16 NOTE — Telephone Encounter (Signed)
Spoke with pt and she stated that Dr. Rockey Situ was supposed to reach out and let us know what labs the pt needed drawn for him so that pt could get them done here with her other labs. I have not received anything from Dr. Donivan Scull office in regards to lab orders.

## 2020-04-16 NOTE — Telephone Encounter (Signed)
Spoke with pt and she has been scheduled for a lab appt.

## 2020-04-16 NOTE — Telephone Encounter (Signed)
I know what he needs and have ordered the labs

## 2020-04-19 ENCOUNTER — Other Ambulatory Visit: Payer: Self-pay

## 2020-04-19 ENCOUNTER — Encounter: Payer: Self-pay | Admitting: Nurse Practitioner

## 2020-04-19 ENCOUNTER — Ambulatory Visit (INDEPENDENT_AMBULATORY_CARE_PROVIDER_SITE_OTHER): Payer: Medicare PPO | Admitting: Nurse Practitioner

## 2020-04-19 ENCOUNTER — Ambulatory Visit (INDEPENDENT_AMBULATORY_CARE_PROVIDER_SITE_OTHER): Payer: Medicare PPO

## 2020-04-19 ENCOUNTER — Other Ambulatory Visit (INDEPENDENT_AMBULATORY_CARE_PROVIDER_SITE_OTHER): Payer: Medicare PPO

## 2020-04-19 VITALS — BP 140/60 | HR 77 | Temp 98.4°F | Ht 62.0 in | Wt 118.0 lb

## 2020-04-19 DIAGNOSIS — R600 Localized edema: Secondary | ICD-10-CM

## 2020-04-19 DIAGNOSIS — G8929 Other chronic pain: Secondary | ICD-10-CM

## 2020-04-19 DIAGNOSIS — Z7902 Long term (current) use of antithrombotics/antiplatelets: Secondary | ICD-10-CM

## 2020-04-19 DIAGNOSIS — M7732 Calcaneal spur, left foot: Secondary | ICD-10-CM | POA: Diagnosis not present

## 2020-04-19 DIAGNOSIS — I7025 Atherosclerosis of native arteries of other extremities with ulceration: Secondary | ICD-10-CM

## 2020-04-19 DIAGNOSIS — I10 Essential (primary) hypertension: Secondary | ICD-10-CM | POA: Diagnosis not present

## 2020-04-19 DIAGNOSIS — I872 Venous insufficiency (chronic) (peripheral): Secondary | ICD-10-CM | POA: Diagnosis not present

## 2020-04-19 DIAGNOSIS — M25572 Pain in left ankle and joints of left foot: Secondary | ICD-10-CM

## 2020-04-19 DIAGNOSIS — E559 Vitamin D deficiency, unspecified: Secondary | ICD-10-CM | POA: Diagnosis not present

## 2020-04-19 DIAGNOSIS — E782 Mixed hyperlipidemia: Secondary | ICD-10-CM

## 2020-04-19 DIAGNOSIS — E034 Atrophy of thyroid (acquired): Secondary | ICD-10-CM

## 2020-04-19 DIAGNOSIS — M7989 Other specified soft tissue disorders: Secondary | ICD-10-CM | POA: Diagnosis not present

## 2020-04-19 DIAGNOSIS — E871 Hypo-osmolality and hyponatremia: Secondary | ICD-10-CM

## 2020-04-19 LAB — CBC WITH DIFFERENTIAL/PLATELET
Basophils Absolute: 0 10*3/uL (ref 0.0–0.1)
Basophils Relative: 0.2 % (ref 0.0–3.0)
Eosinophils Absolute: 0.1 10*3/uL (ref 0.0–0.7)
Eosinophils Relative: 1.8 % (ref 0.0–5.0)
HCT: 36.1 % (ref 36.0–46.0)
Hemoglobin: 12 g/dL (ref 12.0–15.0)
Lymphocytes Relative: 15.9 % (ref 12.0–46.0)
Lymphs Abs: 1.1 10*3/uL (ref 0.7–4.0)
MCHC: 33.3 g/dL (ref 30.0–36.0)
MCV: 94 fl (ref 78.0–100.0)
Monocytes Absolute: 0.8 10*3/uL (ref 0.1–1.0)
Monocytes Relative: 11.4 % (ref 3.0–12.0)
Neutro Abs: 4.9 10*3/uL (ref 1.4–7.7)
Neutrophils Relative %: 70.7 % (ref 43.0–77.0)
Platelets: 202 10*3/uL (ref 150.0–400.0)
RBC: 3.84 Mil/uL — ABNORMAL LOW (ref 3.87–5.11)
RDW: 13.6 % (ref 11.5–15.5)
WBC: 6.9 10*3/uL (ref 4.0–10.5)

## 2020-04-19 LAB — TSH: TSH: 2.26 u[IU]/mL (ref 0.35–4.50)

## 2020-04-19 LAB — LIPID PANEL
Cholesterol: 173 mg/dL (ref 0–200)
HDL: 71.8 mg/dL (ref >39.00)
LDL Cholesterol: 87 mg/dL (ref 0–99)
NonHDL: 101.65
Total CHOL/HDL Ratio: 2
Triglycerides: 72 mg/dL (ref 0.0–149.0)
VLDL: 14.4 mg/dL (ref 0.0–40.0)

## 2020-04-19 LAB — COMPREHENSIVE METABOLIC PANEL
ALT: 9 U/L (ref 0–35)
AST: 18 U/L (ref 0–37)
Albumin: 4.3 g/dL (ref 3.5–5.2)
Alkaline Phosphatase: 87 U/L (ref 39–117)
BUN: 16 mg/dL (ref 6–23)
CO2: 30 mEq/L (ref 19–32)
Calcium: 9.5 mg/dL (ref 8.4–10.5)
Chloride: 109 mEq/L (ref 96–112)
Creatinine, Ser: 0.9 mg/dL (ref 0.40–1.20)
GFR: 58.48 mL/min — ABNORMAL LOW (ref >60.00)
Glucose, Bld: 92 mg/dL (ref 70–99)
Potassium: 4.1 mEq/L (ref 3.5–5.1)
Sodium: 127 mEq/L — ABNORMAL LOW (ref 135–145)
Total Bilirubin: 0.5 mg/dL (ref 0.2–1.2)
Total Protein: 6.2 g/dL (ref 6.0–8.3)

## 2020-04-19 LAB — VITAMIN D 25 HYDROXY (VIT D DEFICIENCY, FRACTURES): VITD: 29.96 ng/mL — ABNORMAL LOW (ref 30.00–100.00)

## 2020-04-19 NOTE — Progress Notes (Signed)
Established Patient Office Visit  Subjective:  [name], female    DOB: 1925/08/24  Age: 86 y.o. MRN: [id number]  CC:  Chief Complaint  Patient presents with  . Acute Visit    left leg swollen    HPI Deanna White is a 86 yo who presents for chronic problems with the left ankle at the site where she had deep skin cancer removed last July.  She had complications with lower extremity atherosclerosis and ulcer formation.  Patient reports the pain and swelling has just never resolved.  Over the last month, she feels like there is a tight band across her lower leg and ankle.  She is followed actively by Vascular and saw Dr. Delana Meyer in May.  He has done studies on her and has requested support stockings which she absolutely cannot tolerate.  It causes too much discomfort.  She also had a visit with Dr. Nehemiah Massed last month and he thought the ulcer site was healing well.  The ankle is always tender.  She actually can walk on it just fine in fact it feels better when she is walking versus when she is sitting down or laying down the nurse pressure on the ankle.  She has tenderness over the lateral malleolus and cannot stand any pressure over this area. But he has had no falls or injuries.   Past Medical History:  Diagnosis Date  . Actinic keratosis   . Allergy   . Carotid artery occlusion   . Colon polyps   . Fall from slipping on ice Feb. 2015  . Heart murmur   . Hypertension   . Menopause   . Pneumonia   . Positive TB test    pt denies  . Sore throat   . Squamous cell carcinoma of skin 04/17/2019   L post lateral ankle, shave removal 10/15/2019  . Thyroid disease   . Varicose veins     Past Surgical History:  Procedure Laterality Date  . ABDOMINAL HYSTERECTOMY    . APPENDECTOMY    . BILATERAL SALPINGOOPHORECTOMY  1964  . CESAREAN SECTION    . EYE SURGERY Right March 2016   Cataract  . LOWER EXTREMITY ANGIOGRAPHY Left 03/14/2019   Procedure: LOWER EXTREMITY  ANGIOGRAPHY;  Surgeon: Katha Cabal, MD;  Location: [facility];  Service: Cardiovascular;  Laterality: Left;  . TONSILLECTOMY    . VEIN SURGERY Left    Leg    Family History  Problem Relation Age of Onset  . Heart disease Mother        After age 38  . Hypertension Mother   . Deep vein thrombosis Mother   . Heart attack Mother   . Kidney disease Father   . Alcohol abuse Brother   . Heart disease Brother   . Cancer Brother        Lung cancer  . Cancer Brother        kidney cancer  . Varicose Veins Sister   . Heart disease Sister        After age 19  . Heart attack Sister   . Heart disease Sister        After age 1  . Other Other        epilepsy  . Hypertension Other   . Cancer Paternal Grandmother        breast cancer    Social History   Socioeconomic History  . Marital status: Widowed    Spouse  name: Not on file  . Number of children: Not on file  . Years of education: Not on file  . Highest education level: Not on file  Occupational History  . Not on file  Tobacco Use  . Smoking status: Never Smoker  . Smokeless tobacco: Never Used  Substance and Sexual Activity  . Alcohol use: No  . Drug use: No  . Sexual activity: Never  Other Topics Concern  . Not on file  Social History Narrative  . Not on file   Social Determinants of Health   Financial Resource Strain: Low Risk   . Difficulty of Paying Living Expenses: Not hard at all  Food Insecurity: No Food Insecurity  . Worried About Charity fundraiser in the Last Year: Never true  . Ran Out of Food in the Last Year: Never true  Transportation Needs: No Transportation Needs  . Lack of Transportation (Medical): No  . Lack of Transportation (Non-Medical): No  Physical Activity:   . Days of Exercise per Week:   . Minutes of Exercise per Session:   Stress: No Stress Concern Present  . Feeling of Stress : Only a little  Social Connections: Unknown  . Frequency of Communication with Friends  and Family: More than three times a week  . Frequency of Social Gatherings with Friends and Family: More than three times a week  . Attends Religious Services: Not on file  . Active Member of Clubs or Organizations: Not on file  . Attends Archivist Meetings: Not on file  . Marital Status: Not on file  Intimate Partner Violence: Not At Risk  . Fear of Current or Ex-Partner: No  . Emotionally Abused: No  . Physically Abused: No  . Sexually Abused: No    Outpatient Medications Prior to Visit  Medication Sig Dispense Refill  . amLODipine (NORVASC) 5 MG tablet TAKE 1 TABLET BY MOUTH EVERY DAY 90 tablet 1  . aspirin EC 81 MG tablet Take 81 mg by mouth at bedtime.     . cloNIDine (CATAPRES) 0.1 MG tablet Take 1 tablet (0.1 mg total) by mouth 2 (two) times daily. 180 tablet 0  . clopidogrel (PLAVIX) 75 MG tablet TAKE 1 TABLET BY MOUTH EVERY DAY 90 tablet 1  . dorzolamide (TRUSOPT) 2 % ophthalmic solution Place 1 drop into both eyes 3 (three) times daily.   12  . levothyroxine (SYNTHROID) 75 MCG tablet TAKE 1 TABLET BY MOUTH EVERY DAY 90 tablet 1  . losartan (COZAAR) 100 MG tablet TAKE 1 TABLET BY MOUTH EVERY DAY 90 tablet 1  . Multiple Vitamins-Minerals (PRESERVISION AREDS 2) CAPS Take 1 capsule by mouth 2 (two) times daily.     . hydrochlorothiazide (HYDRODIURIL) 25 MG tablet TAKE 1 TABLET BY MOUTH EVERY DAY 90 tablet 1   No facility-administered medications prior to visit.    Allergies  Allergen Reactions  . Adhesive [Tape] Rash    Peels skin  . Brimonidine Rash and Other (See Comments)    Pt felt as if throat was closing up, pt also had rash   Review of Systems Pertinent positives none history of present illness otherwise negative.   Objective:    Physical Exam Vitals reviewed.  Constitutional:      Appearance: Normal appearance.  Musculoskeletal:        General: Swelling and tenderness present. Normal range of motion.     Comments: Left ankle is a more slightly  more swollen than the right, she has  definite tenderness over the lateral malleolus.  There is no bony deformity.  Both feet have erythema to the toes.  Slight distal pulse present.  Feet are warm.  Calves are soft bilateral and nontender.  Healing skin cancer deep wound without signs of infection.  Skin:    General: Skin is warm and dry.  Neurological:     General: No focal deficit present.     Mental Status: She is alert and oriented to person, place, and time. Mental status is at baseline.  Psychiatric:        Mood and Affect: Mood normal.        Behavior: Behavior normal.     BP 140/60 (BP Location: Left Arm, Patient Position: Sitting, Cuff Size: Small)   Pulse 77   Temp 98.4 F (36.9 C) (Oral)   Ht 5\' 2"  (1.575 m)   Wt 118 lb (53.5 kg)   SpO2 98%   BMI 21.58 kg/m  Wt Readings from Last 3 Encounters:  04/19/20 118 lb (53.5 kg)  03/17/20 118 lb (53.5 kg)  02/02/20 120 lb (54.4 kg)        There are no preventive care reminders to display for this patient.  There are no preventive care reminders to display for this patient.  Lab Results  Component Value Date   TSH 2.26 04/19/2020   Lab Results  Component Value Date   WBC 6.9 04/19/2020   HGB 12.0 04/19/2020   HCT 36.1 04/19/2020   MCV 94.0 04/19/2020   PLT 202.0 04/19/2020   Lab Results  Component Value Date   NA 127 (L) 04/19/2020   K 4.1 04/19/2020   CO2 30 04/19/2020   GLUCOSE 92 04/19/2020   BUN 16 04/19/2020   CREATININE 0.90 04/19/2020   BILITOT 0.5 04/19/2020   ALKPHOS 87 04/19/2020   AST 18 04/19/2020   ALT 9 04/19/2020   PROT 6.2 04/19/2020   ALBUMIN 4.3 04/19/2020   CALCIUM 9.5 04/19/2020   ANIONGAP 9 07/27/2018   GFR 58.48 (L) 04/19/2020   Lab Results  Component Value Date   CHOL 173 04/19/2020   Lab Results  Component Value Date   HDL 71.80 04/19/2020   Lab Results  Component Value Date   LDLCALC 87 04/19/2020   Lab Results  Component Value Date   TRIG 72.0 04/19/2020   Lab  Results  Component Value Date   CHOLHDL 2 04/19/2020   No results found for: HGBA1C    Assessment & Plan:   Problem List Items Addressed This Visit      Cardiovascular and Mediastinum   Venous insufficiency of both lower extremities   Atherosclerosis of native arteries of the extremities with ulceration (HCC)     Other   Chronic pain of left ankle - Primary   Relevant Orders   DG Ankle Complete Left (Completed)   Edema of left lower extremity   Relevant Orders   DG Ankle Complete Left (Completed)      No orders of the defined types were placed in this encounter.  Patient continue to rest the ankle until Xray result known, elevate, may take Tylenol as needed.  Careful about falls.   Important to get back with Dr. Delana Meyer as he did want to see her regarding the edema.  She needs to let him know she cannot tolerate the support stockings.  He has advised wearing graduated compression socks 10 to 15 mm strength to help contain her mild edema.  He also advised her  to continue walking and begin a more formal exercise program.  Patient admits that she does feel pretty good walking.  He also advised continued antiplatelet therapy and aggressive treatment of lipid abnormalities. The ulcer site seems to be  healing well.  Her blood pressure looks pretty good in the office today.  Follow-up: Return in about 2 weeks (around 05/03/2020).  This visit occurred during the SARS-CoV-2 public health emergency.  Safety protocols were in place, including screening questions prior to the visit, additional usage of staff PPE, and extensive cleaning of exam room while observing appropriate contact time as indicated for disinfecting solutions.   Denice Paradise, NP

## 2020-04-19 NOTE — Patient Instructions (Addendum)
  Continue to rest the ankle until Xray result known, elevate, may take Tylenol as needed.   Careful about falls.   Important to get back with Dr. Delana Meyer as he did want to see her regarding the edema.  She needs to let him know she cannot tolerate the support stockings.    He has advised wearing graduated compression socks 10 to 15 mm strength to help contain her mild edema.   He also advised her to continue walking and begin a more formal exercise program.    He also advised continued antiplatelet therapy and aggressive treatment of lipid abnormalities. The ulcer site seems to be  healing well.    Follow-up with Dr. Derrel Nip in 2 weeks.  Addendum: The x-ray returns without any fracture.  She can continue to walk normally.  Please make an  Appointment  with Dr. Delana Meyer in the office.

## 2020-04-20 ENCOUNTER — Encounter: Payer: Self-pay | Admitting: Nurse Practitioner

## 2020-04-20 ENCOUNTER — Other Ambulatory Visit: Payer: Self-pay | Admitting: Internal Medicine

## 2020-04-20 DIAGNOSIS — E871 Hypo-osmolality and hyponatremia: Secondary | ICD-10-CM | POA: Insufficient documentation

## 2020-04-20 NOTE — Addendum Note (Signed)
Addended by: Crecencio Mc on: 04/20/2020 07:48 AM   Modules accepted: Orders

## 2020-04-20 NOTE — Assessment & Plan Note (Signed)
Suspending hctz.  Repeat bmet and BP 1-2 weeks

## 2020-04-20 NOTE — Telephone Encounter (Signed)
Continue to rest the ankle until Xray result known, elevate, may take Tylenol as needed.   Careful about falls.   Important to get back with Dr. Delana Meyer as he did want to see her regarding the edema.  She needs to let him know she cannot tolerate the support stockings.    He has advised wearing graduated compression socks 10 to 15 mm strength to help contain her mild edema.   He also advised her to continue walking and begin a more formal exercise program.    He also advised continued antiplatelet therapy and aggressive treatment of lipid abnormalities. The ulcer site seems to be  healing well.    Follow-up with Dr. Derrel Nip in 2 weeks.  Addendum: The x-ray returns without any fracture.  She can continue to walk normally.  Please make an  Appointment  with Dr. Delana Meyer in the office.

## 2020-04-20 NOTE — Progress Notes (Signed)
The hctz she is taking for BP is dropping her sodium level.  Please have her suspend the medication,  allow a little salt in her diet and repeat the level in  1-[redacted] weeks along with a RN visit to check BP.  Bmet ordered.

## 2020-04-21 ENCOUNTER — Telehealth: Payer: Self-pay

## 2020-04-21 NOTE — Telephone Encounter (Signed)
LMTCB in regards to lab results.  

## 2020-04-22 ENCOUNTER — Telehealth (INDEPENDENT_AMBULATORY_CARE_PROVIDER_SITE_OTHER): Payer: Self-pay

## 2020-04-22 NOTE — Telephone Encounter (Signed)
Patient called informing that she is having left leg swelling,tenderness with ankle,and burning sensation across foot and back of the left leg. The patient informed that she is not able tolerate wearing her compression stockings.Patient was advise by her provider Dawson Bills Np to contact our office. I spoke with Dr Delana Meyer and he recommended  for the patient to continue compression therapy and elevation. Patient was made aware with medical advice and verbalize understanding.

## 2020-05-05 ENCOUNTER — Emergency Department
Admission: EM | Admit: 2020-05-05 | Discharge: 2020-05-05 | Disposition: A | Discharge status: Home / Self Care | Payer: Medicare PPO | Attending: Emergency Medicine | Admitting: Emergency Medicine

## 2020-05-05 ENCOUNTER — Other Ambulatory Visit (INDEPENDENT_AMBULATORY_CARE_PROVIDER_SITE_OTHER): Payer: Medicare PPO

## 2020-05-05 ENCOUNTER — Ambulatory Visit: Payer: Medicare PPO

## 2020-05-05 ENCOUNTER — Other Ambulatory Visit: Payer: Self-pay

## 2020-05-05 ENCOUNTER — Emergency Department: Payer: Medicare PPO

## 2020-05-05 VITALS — BP 190/78 | HR 72

## 2020-05-05 DIAGNOSIS — R531 Weakness: Secondary | ICD-10-CM | POA: Diagnosis present

## 2020-05-05 DIAGNOSIS — E039 Hypothyroidism, unspecified: Secondary | ICD-10-CM | POA: Diagnosis not present

## 2020-05-05 DIAGNOSIS — I10 Essential (primary) hypertension: Secondary | ICD-10-CM

## 2020-05-05 DIAGNOSIS — J90 Pleural effusion, not elsewhere classified: Secondary | ICD-10-CM | POA: Insufficient documentation

## 2020-05-05 DIAGNOSIS — Z7982 Long term (current) use of aspirin: Secondary | ICD-10-CM | POA: Insufficient documentation

## 2020-05-05 DIAGNOSIS — R21 Rash and other nonspecific skin eruption: Secondary | ICD-10-CM | POA: Diagnosis not present

## 2020-05-05 DIAGNOSIS — E871 Hypo-osmolality and hyponatremia: Secondary | ICD-10-CM | POA: Diagnosis not present

## 2020-05-05 DIAGNOSIS — J439 Emphysema, unspecified: Secondary | ICD-10-CM | POA: Diagnosis not present

## 2020-05-05 DIAGNOSIS — Z79899 Other long term (current) drug therapy: Secondary | ICD-10-CM | POA: Insufficient documentation

## 2020-05-05 DIAGNOSIS — R0602 Shortness of breath: Secondary | ICD-10-CM | POA: Insufficient documentation

## 2020-05-05 DIAGNOSIS — R5381 Other malaise: Secondary | ICD-10-CM | POA: Diagnosis not present

## 2020-05-05 DIAGNOSIS — J9811 Atelectasis: Secondary | ICD-10-CM | POA: Diagnosis not present

## 2020-05-05 LAB — BASIC METABOLIC PANEL
Anion gap: 12 (ref 5–15)
BUN: 17 mg/dL (ref 6–23)
BUN: 16 mg/dL (ref 8–23)
CO2: 27 mEq/L (ref 19–32)
CO2: 23 mmol/L (ref 22–32)
Calcium: 9 mg/dL (ref 8.4–10.5)
Calcium: 9.4 mg/dL (ref 8.9–10.3)
Chloride: 105 mEq/L (ref 96–112)
Chloride: 104 mmol/L (ref 98–111)
Creatinine, Ser: 0.8 mg/dL (ref 0.40–1.20)
Creatinine, Ser: 0.65 mg/dL (ref 0.44–1.00)
GFR calc Af Amer: >60 mL/min (ref >60)
GFR calc non Af Amer: >60 mL/min (ref >60)
GFR: 66.99 mL/min (ref >60.00)
Glucose, Bld: 90 mg/dL (ref 70–99)
Glucose, Bld: 96 mg/dL (ref 70–99)
Potassium: 4.2 mEq/L (ref 3.5–5.1)
Potassium: 4.1 mmol/L (ref 3.5–5.1)
Sodium: 138 mEq/L (ref 135–145)
Sodium: 139 mmol/L (ref 135–145)

## 2020-05-05 LAB — CBC
HCT: 40.1 % (ref 36.0–46.0)
Hemoglobin: 13.4 g/dL (ref 12.0–15.0)
MCH: 31.4 pg (ref 26.0–34.0)
MCHC: 33.4 g/dL (ref 30.0–36.0)
MCV: 93.9 fL (ref 80.0–100.0)
Platelets: 190 10*3/uL (ref 150–400)
RBC: 4.27 MIL/uL (ref 3.87–5.11)
RDW: 13.8 % (ref 11.5–15.5)
WBC: 6.4 10*3/uL (ref 4.0–10.5)
nRBC: 0 % (ref 0.0–0.2)

## 2020-05-05 LAB — TROPONIN I (HIGH SENSITIVITY): Troponin I (High Sensitivity): 16 ng/L (ref <18)

## 2020-05-05 MED ORDER — AMLODIPINE BESYLATE 5 MG PO TABS
5.0000 mg | ORAL_TABLET | Freq: Once | ORAL | Status: AC
  Administered 2020-05-05: 5 mg via ORAL
  Filled 2020-05-05: qty 1

## 2020-05-05 MED ORDER — CLONIDINE HCL 0.1 MG PO TABS
0.1000 mg | ORAL_TABLET | Freq: Once | ORAL | Status: AC
  Administered 2020-05-05: 0.1 mg via ORAL
  Filled 2020-05-05: qty 1

## 2020-05-05 NOTE — ED Triage Notes (Signed)
Reports hypertension over past few days and generalized weakness. Recently had medications changed.

## 2020-05-05 NOTE — ED Provider Notes (Signed)
[facility] Emergency Department Provider Note   ____________________________________________   First MD Initiated Contact with Patient 05/05/20 1638     (approximate)  I have reviewed the triage vital signs and the nursing notes.   HISTORY  Chief Complaint Weakness and Hypertension    HPI Deanna White is a 86 y.o. female with possible history of hypertension, hyperlipidemia, atrial fibrillation, and peripheral vascular disease who presents to the ED complaining of hypertension and weakness.  Patient reports that she checked her blood pressure when she got up this morning and found it to be elevated.  She then went to a regular scheduled PCP appointment, where she was again told her blood pressure was high, but that she should go home and continue to monitor it.  When it reached over 811 systolic at home, patient called EMS.  She states she has been feeling slightly weaker than usual with some mild shortness of breath at times, but she denies any fevers, cough, chest pain, headache, numbness, weakness, or difficulty urinating.  Her hydrochlorothiazide was recently stopped 2 weeks ago due to hyponatremia, she otherwise has been compliant with her amlodipine, clonidine, and losartan.        Past Medical History:  Diagnosis Date  . Actinic keratosis   . Allergy   . Carotid artery occlusion   . Colon polyps   . Fall from slipping on ice Feb. 2015  . Heart murmur   . Hypertension   . Menopause   . Pneumonia   . Positive TB test    pt denies  . Sore throat   . Squamous cell carcinoma of skin 04/17/2019   L post lateral ankle, shave removal 10/15/2019  . Thyroid disease   . Varicose veins     Patient Active Problem List   Diagnosis Date Noted  . Hyponatremia 04/20/2020  . Chronic pain of left ankle 04/19/2020  . Edema of left lower extremity 04/19/2020  . Varicella zoster 09/24/2019  . Atherosclerosis of native arteries of the extremities with  ulceration ([place]) 03/06/2019  . Venous ulcer of lower leg without varicose veins ([place]) 01/30/2019  . Trichiasis of right upper eyelid 11/06/2018  . Venous insufficiency of both lower extremities 04/17/2018  . Near syncope 01/26/2018  . Pseudophakia, left eye 01/16/2018  . LVH (left ventricular hypertrophy) due to hypertensive disease, without heart failure 12/03/2017  . Moderate mitral insufficiency 04/17/2017  . Constipation 03/03/2017  . Paroxysmal A-fib ([place]) 04/13/2016  . Mixed hyperlipidemia 03/27/2016  . Medicare annual wellness visit, subsequent 03/03/2016  . Encounter for preventive health examination 03/03/2016  . 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 Nonexudative age-related macular degeneration 12/22/2015  . Open angle with borderline findings and low glaucoma risk in both eyes 12/22/2015  . Bilateral ocular hypertension 12/22/2015  . Optic neuropathy, left 12/22/2015  . Anemia 02/25/2015  . Patient underweight 02/23/2015  . Hypothyroidism 02/23/2015  . Cataract (lens) fragments in eye following cataract surgery, left eye 01/20/2015  . Senile osteoporosis 02/22/2014  . Osteoporosis of vertebra 02/22/2014  . Benign essential hypertension 02/20/2014  . Peripheral vascular disease ([place]) 09/15/2011    Past Surgical History:  Procedure Laterality Date  . ABDOMINAL HYSTERECTOMY    . APPENDECTOMY    . BILATERAL SALPINGOOPHORECTOMY  1964  . CESAREAN SECTION    . EYE SURGERY Right March 2016   Cataract  . LOWER EXTREMITY ANGIOGRAPHY Left 03/14/2019   Procedure: LOWER EXTREMITY ANGIOGRAPHY;  Surgeon: Katha Cabal, MD;  Location: [facility];  Service: Cardiovascular;  Laterality: Left;  . TONSILLECTOMY    . VEIN SURGERY Left    Leg    Prior to Admission medications   Medication Sig Start Date End Date Taking? Authorizing Provider  amLODipine (NORVASC) 5 MG tablet TAKE 1 TABLET BY MOUTH EVERY DAY Patient taking differently: Take 5  mg by mouth daily.  01/19/20   Crecencio Mc, MD  aspirin EC 81 MG tablet Take 81 mg by mouth at bedtime.     [provider]  cloNIDine (CATAPRES) 0.1 MG tablet Take 1 tablet (0.1 mg total) by mouth 2 (two) times daily. 02/02/20 02/01/21  Minna Merritts, MD  clopidogrel (PLAVIX) 75 MG tablet TAKE 1 TABLET BY MOUTH EVERY DAY Patient taking differently: Take 75 mg by mouth daily.  04/06/20   Schnier, Dolores Lory, MD  dorzolamide (TRUSOPT) 2 % ophthalmic solution Place 1 drop into both eyes 3 (three) times daily.  07/18/16   [provider]  levothyroxine (SYNTHROID) 75 MCG tablet TAKE 1 TABLET BY MOUTH EVERY DAY Patient taking differently: Take 75 mcg by mouth daily before breakfast.  01/09/20   Crecencio Mc, MD  losartan (COZAAR) 100 MG tablet TAKE 1 TABLET BY MOUTH EVERY DAY Patient taking differently: Take 100 mg by mouth daily.  04/06/20   Crecencio Mc, MD  Multiple Vitamins-Minerals (PRESERVISION AREDS 2) CAPS Take 1 capsule by mouth 2 (two) times daily.     [provider]    Allergies Adhesive [tape] and Brimonidine  Family History  Problem Relation Age of Onset  . Heart disease Mother        After age 56  . Hypertension Mother   . Deep vein thrombosis Mother   . Heart attack Mother   . Kidney disease Father   . Alcohol abuse Brother   . Heart disease Brother   . Cancer Brother        Lung cancer  . Cancer Brother        kidney cancer  . Varicose Veins Sister   . Heart disease Sister        After age 65  . Heart attack Sister   . Heart disease Sister        After age 26  . Other Other        epilepsy  . Hypertension Other   . Cancer Paternal Grandmother        breast cancer    Social History Social History   Tobacco Use  . Smoking status: Never Smoker  . Smokeless tobacco: Never Used  Substance Use Topics  . Alcohol use: No  . Drug use: No    Review of Systems  Constitutional: No fever/chills.  Positive for generalized  weakness. Eyes: No visual changes. ENT: No sore throat. Cardiovascular: Denies chest pain. Respiratory: Positive for shortness of breath. Gastrointestinal: No abdominal pain.  No nausea, no vomiting.  No diarrhea.  No constipation. Genitourinary: Negative for dysuria. Musculoskeletal: Negative for back pain. Skin: Negative for rash. Neurological: Negative for headaches, focal weakness or numbness.  ____________________________________________   PHYSICAL EXAM:  VITAL SIGNS: ED Triage Vitals  Enc Vitals Group     BP 05/05/20 1436 (!) 214/85     Pulse Rate 05/05/20 1436 80     Resp 05/05/20 1436 (!) 22     Temp 05/05/20 1447 98 F (36.7 C)     Temp Source 05/05/20 1436 Oral     SpO2  05/05/20 1436 95 %     Weight 05/05/20 1438 118 lb (53.5 kg)     Height 05/05/20 1438 5\' 2"  (1.575 m)     Head Circumference --      Peak Flow --      Pain Score --      Pain Loc --      Pain Edu? --      Excl. in [place]? --     Constitutional: Alert and oriented. Eyes: Conjunctivae are normal. Head: Atraumatic. Nose: No congestion/rhinnorhea. Mouth/Throat: Mucous membranes are moist. Neck: Normal ROM Cardiovascular: Normal rate, regular rhythm. Grossly normal heart sounds. Respiratory: Normal respiratory effort.  No retractions. Lungs CTAB. Gastrointestinal: Soft and nontender. No distention. Genitourinary: deferred Musculoskeletal: No lower extremity tenderness, trace edema to mid shins bilaterally. Neurologic:  Normal speech and language. No gross focal neurologic deficits are appreciated. Skin:  Skin is warm, dry and intact. No rash noted. Psychiatric: Mood and affect are normal. Speech and behavior are normal.  ____________________________________________   LABS (all labs ordered are listed, but only abnormal results are displayed)  Labs Reviewed  BASIC METABOLIC PANEL  CBC  TROPONIN I (HIGH SENSITIVITY)   ____________________________________________  EKG  ED ECG REPORT I,  Blake Divine, the attending physician, personally viewed and interpreted this ECG.   Date: 05/05/2020  EKG Time: 14:42  Rate: 77  Rhythm: normal sinus rhythm  Axis: LAD  Intervals:none  ST&T Change: None   PROCEDURES  Procedure(s) performed (including Critical Care):  Procedures   ____________________________________________   INITIAL IMPRESSION / ASSESSMENT AND PLAN / ED COURSE       86 year old female with past medical history of hypertension, hyperlipidemia, atrial fibrillation, and peripheral vascular disease who presents to the ED complaining of generalized weakness, mild shortness of breath, and elevated blood pressure she noticed today.  She has no focal neurologic deficits on exam and I doubt stroke.  EKG is unremarkable and lab work is reassuring, given her shortness of breath we will screen chest x-ray and troponin, but thus far no evidence of hypertensive emergency.  Her elevated blood pressure is likely due to recent stoppage of her hydrochlorothiazide when she developed hyponatremia.  We will give her usual evening medications for hypertension and reassess.  Troponin is within normal limits, doubt ACS.  Chest x-ray shows small bilateral pleural effusions but pulmonary edema.  She has no fevers or cough to suggest pneumonia and no indication for antibiotics at this time.  Her blood pressure is gradually improving and she is appropriate for discharge home with PCP follow-up.  She was counseled to have her blood pressure rechecked at her PCPs office and also discuss findings of pleural effusion.  She was counseled to return to the ED for new or worsening symptoms, patient agrees with plan.      ____________________________________________   FINAL CLINICAL IMPRESSION(S) / ED DIAGNOSES  Final diagnoses:  Essential hypertension  Pleural effusion     ED Discharge Orders    None       Note:  This document was prepared using Dragon voice recognition software and  may include unintentional dictation errors.   Blake Divine, MD 05/05/20 [id number]

## 2020-05-05 NOTE — ED Notes (Signed)
Pt states that she has been having a high blood pressure today and that she had a bad nose bleed a couple days ago. Paitnet denies dizziness, blurred vision, nvd, and fevers at home.

## 2020-05-05 NOTE — Progress Notes (Signed)
Patient is here for a BP check due to bp being high at home due to being taken off her medication, as per patient.  Currently patients BP is 190/78 and BPM is 72. Ten Minutes prior BP was 192/78 and BPM is 75. Patient has no complaints of headaches, blurry vision, chest pain, arm pain, light headedness, dizziness, and nor jaw pain. Please see previous note for order. Spoken to PCP. Dr. Derrel Nip stated for patient to go home and keep an eye on BP since patient is prone to have it high in the office and a lot lower at home.

## 2020-05-07 ENCOUNTER — Ambulatory Visit (INDEPENDENT_AMBULATORY_CARE_PROVIDER_SITE_OTHER): Payer: Medicare PPO | Admitting: Internal Medicine

## 2020-05-07 ENCOUNTER — Other Ambulatory Visit: Payer: Self-pay

## 2020-05-07 ENCOUNTER — Encounter: Payer: Self-pay | Admitting: Internal Medicine

## 2020-05-07 DIAGNOSIS — I10 Essential (primary) hypertension: Secondary | ICD-10-CM | POA: Diagnosis not present

## 2020-05-07 DIAGNOSIS — I700 Atherosclerosis of aorta: Secondary | ICD-10-CM

## 2020-05-07 DIAGNOSIS — E871 Hypo-osmolality and hyponatremia: Secondary | ICD-10-CM | POA: Diagnosis not present

## 2020-05-07 DIAGNOSIS — I739 Peripheral vascular disease, unspecified: Secondary | ICD-10-CM

## 2020-05-07 MED ORDER — AMLODIPINE BESYLATE 10 MG PO TABS: 10.0000 mg | ORAL_TABLET | Freq: Every day | ORAL | 1 refills | Status: DC

## 2020-05-07 NOTE — Patient Instructions (Addendum)
Resume 10 mg dose of amlodipine   Take 5 mg tonight  With your clonidine  Starting tomorrow continue 10 mg  In the evening with your clonidine dose   Continue 100 mg losartan  In the evening   You can use furosemide as needed for swelling  20 mg not more than every other day   Check with Drenda Freeze to see if they have compression    You can add up to 2000 mg of acetominophen (tylenol) every day safely  In divided doses (500 mg every 6 hours  Or 1000 mg every 12 hours.)

## 2020-05-07 NOTE — Progress Notes (Addendum)
Subjective:  [name], female    DOB: 16-Apr-1925  Age: 86 y.o. MRN: [id number]  CC: Diagnoses of Benign essential hypertension, Hyponatremia, Aortic atherosclerosis ([place]), and Peripheral vascular disease ([place]) were pertinent to this visit.  HPI Deanna White presents for ER follow up  For elevated blood pressure.  She is accompanied by her daughter today .  This visit occurred during the SARS-CoV-2 public health emergency.  Safety protocols were in place, including screening questions prior to the visit, additional usage of staff PPE, and extensive cleaning of exam room while observing appropriate contact time as indicated for disinfecting solutions.   She is a 86 yr old female with hypertension previously managed with 4 medications.  hctz was recently stopped due to significant hyponatremia.  She had been compliant with amlodipine , losartan and clonidine   But developed systolic readings of 335 accompanied by weakness and was treated in the ER on  August 4 . Chest x ray ,  EKG and troponins were unremarkable.   Says she took amlodipine clonidine and losartan this morning at  Least 2 hours ago.  Home readings have been < 4562 but < 563 systolic  Aortic atherosclerosis:  Noted on chest x ray In 2019.  Reviewed with patient today . Reviewed recent lipid panel,  Untreated. LDL is <90 and  She is taking Plavix/asa.   Outpatient Medications Prior to Visit  Medication Sig Dispense Refill  . aspirin EC 81 MG tablet Take 81 mg by mouth at bedtime.     . cloNIDine (CATAPRES) 0.1 MG tablet Take 1 tablet (0.1 mg total) by mouth 2 (two) times daily. 180 tablet 0  . clopidogrel (PLAVIX) 75 MG tablet TAKE 1 TABLET BY MOUTH EVERY DAY (Patient taking differently: Take 75 mg by mouth daily. ) 90 tablet 1  . dorzolamide (TRUSOPT) 2 % ophthalmic solution Place 1 drop into both eyes 3 (three) times daily.   12  . levothyroxine (SYNTHROID) 75 MCG tablet TAKE 1 TABLET BY MOUTH EVERY DAY (Patient  taking differently: Take 75 mcg by mouth daily before breakfast. ) 90 tablet 1  . losartan (COZAAR) 100 MG tablet TAKE 1 TABLET BY MOUTH EVERY DAY (Patient taking differently: Take 100 mg by mouth daily. ) 90 tablet 1  . Multiple Vitamins-Minerals (PRESERVISION AREDS 2) CAPS Take 1 capsule by mouth 2 (two) times daily.     Marland Kitchen amLODipine (NORVASC) 5 MG tablet TAKE 1 TABLET BY MOUTH EVERY DAY (Patient taking differently: Take 5 mg by mouth daily. ) 90 tablet 1   No facility-administered medications prior to visit.    Review of Systems;  Patient denies headache, fevers, malaise, unintentional weight loss, skin rash, eye pain, sinus congestion and sinus pain, sore throat, dysphagia,  hemoptysis , cough, dyspnea, wheezing, chest pain, palpitations, orthopnea, edema, abdominal pain, nausea, melena, diarrhea, constipation, flank pain, dysuria, hematuria, urinary  Frequency, nocturia, numbness, tingling, seizures,  Focal weakness, Loss of consciousness,  Tremor, insomnia, depression, anxiety, and suicidal ideation.      Objective:  BP (!) 180/68 (BP Location: Left Arm, Patient Position: Sitting, Cuff Size: Normal)   Pulse 80   Temp 98.2 F (36.8 C) (Oral)   Resp 16   Ht 5\' 2"  (1.575 m)   Wt 119 lb 3.2 oz (54.1 kg)   SpO2 96%   BMI 21.80 kg/m   BP Readings from Last 3 Encounters:  05/07/20 (!) 180/68  05/05/20 (!) 198/76  05/05/20 (!) 190/78  Wt Readings from Last 3 Encounters:  05/07/20 119 lb 3.2 oz (54.1 kg)  05/05/20 118 lb (53.5 kg)  04/19/20 118 lb (53.5 kg)    General appearance: alert, cooperative and appears stated age Ears: normal TM's and external ear canals both ears Throat: lips, mucosa, and tongue normal; teeth and gums normal Neck: no adenopathy, no carotid bruit, supple, symmetrical, trachea midline and thyroid not enlarged, symmetric, no tenderness/mass/nodules Back: symmetric, no curvature. ROM normal. No CVA tenderness. Lungs: clear to auscultation  bilaterally Heart: regular rate and rhythm, S1, S2 normal, no murmur, click, rub or gallop Abdomen: soft, non-tender; bowel sounds normal; no masses,  no organomegaly Pulses: 2+ and symmetric Skin: Skin color, texture, turgor normal. No rashes or lesions Lymph nodes: Cervical, supraclavicular, and axillary nodes normal.  No results found for: HGBA1C  Lab Results  Component Value Date   CREATININE 0.65 05/05/2020   CREATININE 0.80 05/05/2020   CREATININE 0.90 04/19/2020    Lab Results  Component Value Date   WBC 6.4 05/05/2020   HGB 13.4 05/05/2020   HCT 40.1 05/05/2020   PLT 190 05/05/2020   GLUCOSE 96 05/05/2020   CHOL 173 04/19/2020   TRIG 72.0 04/19/2020   HDL 71.80 04/19/2020   LDLDIRECT 76.0 08/31/2016   LDLCALC 87 04/19/2020   ALT 9 04/19/2020   AST 18 04/19/2020   NA 139 05/05/2020   K 4.1 05/05/2020   CL 104 05/05/2020   CREATININE 0.65 05/05/2020   BUN 16 05/05/2020   CO2 23 05/05/2020   TSH 2.26 04/19/2020   MICROALBUR <0.7 10/16/2017    DG Chest 2 View  Result Date: 05/05/2020 CLINICAL DATA:  86 year old female with shortness of breath EXAM: CHEST - 2 VIEW COMPARISON:  Chest radiograph dated 09/27/2018 FINDINGS: There are small bilateral pleural effusions with bibasilar atelectasis or infiltrate. Overall increase in the degree of pleural effusions and associated atelectasis compared to the prior radiograph. There is background of emphysema. No pneumothorax. Stable cardiac silhouette. Atherosclerotic calcification of the aorta. No acute osseous pathology. Osteopenia. Old lower thoracic compression fracture. IMPRESSION: Small bilateral pleural effusions with bibasilar atelectasis or infiltrate. Electronically Signed   By: Anner Crete M.D.   On: 05/05/2020 17:44    Assessment & Plan:   Problem List Items Addressed This Visit      Unprioritized   Benign essential hypertension    Uncontrolled since stopping hctz.  Er records reviewed.  Will increase  amlodipine to 10 mg daily. Continue losartan and clonidine  Recheck one week.    Lab Results  Component Value Date   CREATININE 0.65 05/05/2020   Lab Results  Component Value Date   NA 139 05/05/2020   K 4.1 05/05/2020   CL 104 05/05/2020   CO2 23 05/05/2020         Relevant Medications   amLODipine (NORVASC) 10 MG tablet   Hyponatremia    Resolved, secondary to hctz.  wil prescribe prn furosemide for edema and encouarge use of light compression knee highs       Aortic atherosclerosis ([facility])    continue asa/plavix.  Statin deferred due to age and current LDL < 100      Relevant Medications   amLODipine (NORVASC) 10 MG tablet   Peripheral vascular disease ([place])    S/p L PTA 03/14/19 for ulceration , now resolved. .  Most recent ABI was done in May 2021,  0.99  Occluded ATA .  Continue plavix /ASA.  Lab Results  Component Value  Date   CHOL 173 04/19/2020   HDL 71.80 04/19/2020   LDLCALC 87 04/19/2020   LDLDIRECT 76.0 08/31/2016   TRIG 72.0 04/19/2020   CHOLHDL 2 04/19/2020         Relevant Medications   amLODipine (NORVASC) 10 MG tablet    A total of 30 minutes was spent with patient more than half of which was spent in counseling patient on the above mentioned issues , reviewing and explaining recent labs and imaging studies done during ER visit  and coordination of care.  I have changed Van Clines. Welshans's amLODipine. I am also having her maintain her aspirin EC, PreserVision AREDS 2, dorzolamide, levothyroxine, cloNIDine, clopidogrel, and losartan.  Meds ordered this encounter  Medications  . amLODipine (NORVASC) 10 MG tablet    Sig: Take 1 tablet (10 mg total) by mouth daily.    Dispense:  90 tablet    Refill:  1    KEEP ON FILE FOR FUTURE REFILLS    Medications Discontinued During This Encounter  Medication Reason  . amLODipine (NORVASC) 5 MG tablet     Follow-up: Return in about 4 weeks (around 06/04/2020).   Crecencio Mc, MD

## 2020-05-09 NOTE — Assessment & Plan Note (Addendum)
Resolved, secondary to hctz.  wil prescribe prn furosemide for edema and encouarge use of light compression knee highs

## 2020-05-09 NOTE — Assessment & Plan Note (Signed)
Uncontrolled since stopping hctz.  Er records reviewed.  Will increase amlodipine to 10 mg daily. Continue losartan and clonidine  Recheck one week.    Lab Results  Component Value Date   CREATININE 0.65 05/05/2020   Lab Results  Component Value Date   NA 139 05/05/2020   K 4.1 05/05/2020   CL 104 05/05/2020   CO2 23 05/05/2020

## 2020-05-10 NOTE — Progress Notes (Signed)
  I have reviewed the above information and agree with above.  Patient was tread on August 6 and amldoipine dose increased   Deborra Medina, MD

## 2020-05-11 ENCOUNTER — Ambulatory Visit: Payer: Medicare PPO | Admitting: Internal Medicine

## 2020-05-12 DIAGNOSIS — I700 Atherosclerosis of aorta: Secondary | ICD-10-CM | POA: Insufficient documentation

## 2020-05-12 NOTE — Assessment & Plan Note (Addendum)
S/p L PTA 03/14/19 for ulceration , now resolved. .  Most recent ABI was done in May 2021,  0.99  Occluded ATA .  Continue plavix /ASA.  Lab Results  Component Value Date   CHOL 173 04/19/2020   HDL 71.80 04/19/2020   LDLCALC 87 04/19/2020   LDLDIRECT 76.0 08/31/2016   TRIG 72.0 04/19/2020   CHOLHDL 2 04/19/2020

## 2020-05-12 NOTE — Assessment & Plan Note (Signed)
continue asa/plavix.  Statin deferred due to age and current LDL < 100 

## 2020-05-26 ENCOUNTER — Other Ambulatory Visit: Payer: Self-pay

## 2020-05-26 ENCOUNTER — Ambulatory Visit: Payer: Medicare PPO | Admitting: Dermatology

## 2020-05-26 DIAGNOSIS — I8310 Varicose veins of unspecified lower extremity with inflammation: Secondary | ICD-10-CM

## 2020-05-26 DIAGNOSIS — L578 Other skin changes due to chronic exposure to nonionizing radiation: Secondary | ICD-10-CM | POA: Diagnosis not present

## 2020-05-26 DIAGNOSIS — D692 Other nonthrombocytopenic purpura: Secondary | ICD-10-CM

## 2020-05-26 DIAGNOSIS — Z85828 Personal history of other malignant neoplasm of skin: Secondary | ICD-10-CM | POA: Diagnosis not present

## 2020-05-26 DIAGNOSIS — Z1283 Encounter for screening for malignant neoplasm of skin: Secondary | ICD-10-CM

## 2020-05-26 DIAGNOSIS — L814 Other melanin hyperpigmentation: Secondary | ICD-10-CM | POA: Diagnosis not present

## 2020-05-26 DIAGNOSIS — D229 Melanocytic nevi, unspecified: Secondary | ICD-10-CM

## 2020-05-26 DIAGNOSIS — L821 Other seborrheic keratosis: Secondary | ICD-10-CM

## 2020-05-26 DIAGNOSIS — D1800 Hemangioma unspecified site: Secondary | ICD-10-CM | POA: Diagnosis not present

## 2020-05-26 NOTE — Patient Instructions (Signed)
Melanoma ABCDEs ° °Melanoma is the most dangerous type of skin cancer, and is the leading cause of death from skin disease.  You are more likely to develop melanoma if you: °· Have light-colored skin, light-colored eyes, or red or blond hair °· Spend a lot of time in the sun °· Tan regularly, either outdoors or in a tanning bed °· Have had blistering sunburns, especially during childhood °· Have a close family member who has had a melanoma °· Have atypical moles or large birthmarks ° °Early detection of melanoma is key since treatment is typically straightforward and cure rates are extremely high if we catch it early.  ° °The first sign of melanoma is often a change in a mole or a new dark spot.  The ABCDE system is a way of remembering the signs of melanoma. ° °A for asymmetry:  The two halves do not match. °B for border:  The edges of the growth are irregular. °C for color:  A mixture of colors are present instead of an even brown color. °D for diameter:  Melanomas are usually (but not always) greater than 6mm - the size of a pencil eraser. °E for evolution:  The spot keeps changing in size, shape, and color. ° °Please check your skin once per month between visits. You can use a small mirror in front and a large mirror behind you to keep an eye on the back side or your body.  ° °If you see any new or changing lesions before your next follow-up, please call to schedule a visit. ° °Please continue daily skin protection including broad spectrum sunscreen SPF 30+ to sun-exposed areas, reapplying every 2 hours as needed when you're outdoors.  ° °Recommend daily broad spectrum sunscreen SPF 30+ to sun-exposed areas, reapply every 2 hours as needed. Call for new or changing lesions. ° °

## 2020-05-26 NOTE — Progress Notes (Signed)
   Follow-Up Visit   Subjective  Deanna White is a 86 y.o. female who presents for the following: Annual Exam (Pt presents for TBSE, Hx of SCC L post lateral ankle ). The patient presents for Total-Body Skin Exam (TBSE) for skin cancer screening and mole check  The following portions of the chart were reviewed this encounter and updated as appropriate:  Tobacco  Allergies  Meds  Problems  Med Hx  Surg Hx  Fam Hx     Review of Systems:  No other skin or systemic complaints except as noted in HPI or Assessment and Plan.  Objective  Well appearing patient in no apparent distress; mood and affect are within normal limits.  A full examination was performed including scalp, head, eyes, ears, nose, lips, neck, chest, axillae, abdomen, back, buttocks, bilateral upper extremities, bilateral lower extremities, hands, feet, fingers, toes, fingernails, and toenails. All findings within normal limits unless otherwise noted below.  Objective  Left post lateral ankle: Indention with no evidence of recurrence, no lymphadenopathy.    Assessment & Plan  History of SCC (squamous cell carcinoma) of skin Left post lateral ankle  The patient will observe these symptoms, and report promptly any worsening or unexpected persistence.  If well, may return prn.   Skin cancer screening  Lentigines - Scattered tan macules - Discussed due to sun exposure - Benign, observe - Call for any changes  Seborrheic Keratoses - Stuck-on, waxy, tan-brown papules and plaques  - Discussed benign etiology and prognosis. - Observe - Call for any changes  Melanocytic Nevi - Tan-brown and/or pink-flesh-colored symmetric macules and papules - Benign appearing on exam today - Observation - Call clinic for new or changing moles - Recommend daily use of broad spectrum spf 30+ sunscreen to sun-exposed areas.   Hemangiomas - Red papules - Discussed benign nature - Observe - Call for any changes  Actinic  Damage - diffuse scaly erythematous macules with underlying dyspigmentation - Recommend daily broad spectrum sunscreen SPF 30+ to sun-exposed areas, reapply every 2 hours as needed.  - Call for new or changing lesions.  Skin cancer screening performed today.  Purpura - Violaceous macules and patches - Benign - Related to age, sun damage and/or use of blood thinners - Observe - Can use OTC arnica containing moisturizer such as Dermend Bruise Formula if desired - Call for worsening or other concerns  Varicose Veins - Dilated blue, purple or red veins at the lower extremities - Reassured - These can be treated by sclerotherapy (a procedure to inject a medicine into the veins to make them disappear) if desired, but the treatment is not covered by insurance  Return in about 1 year (around 05/26/2021) for TBSE.  IMarye Round, CMA, am acting as scribe for Sarina Ser, MD .  Documentation: I have reviewed the above documentation for accuracy and completeness, and I agree with the above.  Sarina Ser, MD

## 2020-05-31 DIAGNOSIS — Z85828 Personal history of other malignant neoplasm of skin: Secondary | ICD-10-CM | POA: Diagnosis not present

## 2020-05-31 DIAGNOSIS — Z7982 Long term (current) use of aspirin: Secondary | ICD-10-CM | POA: Diagnosis not present

## 2020-05-31 DIAGNOSIS — I70209 Unspecified atherosclerosis of native arteries of extremities, unspecified extremity: Secondary | ICD-10-CM | POA: Diagnosis not present

## 2020-05-31 DIAGNOSIS — Z7902 Long term (current) use of antithrombotics/antiplatelets: Secondary | ICD-10-CM | POA: Diagnosis not present

## 2020-05-31 DIAGNOSIS — R32 Unspecified urinary incontinence: Secondary | ICD-10-CM | POA: Diagnosis not present

## 2020-05-31 DIAGNOSIS — E039 Hypothyroidism, unspecified: Secondary | ICD-10-CM | POA: Diagnosis not present

## 2020-05-31 DIAGNOSIS — H409 Unspecified glaucoma: Secondary | ICD-10-CM | POA: Diagnosis not present

## 2020-05-31 DIAGNOSIS — I10 Essential (primary) hypertension: Secondary | ICD-10-CM | POA: Diagnosis not present

## 2020-06-02 ENCOUNTER — Encounter: Payer: Self-pay | Admitting: Dermatology

## 2020-06-02 DIAGNOSIS — H903 Sensorineural hearing loss, bilateral: Secondary | ICD-10-CM | POA: Diagnosis not present

## 2020-06-04 ENCOUNTER — Ambulatory Visit: Payer: Medicare PPO | Admitting: Internal Medicine

## 2020-06-09 ENCOUNTER — Other Ambulatory Visit: Payer: Self-pay

## 2020-06-09 ENCOUNTER — Ambulatory Visit (INDEPENDENT_AMBULATORY_CARE_PROVIDER_SITE_OTHER): Payer: Medicare PPO | Admitting: Internal Medicine

## 2020-06-09 ENCOUNTER — Encounter: Payer: Self-pay | Admitting: Internal Medicine

## 2020-06-09 VITALS — BP 172/62 | HR 70 | Temp 97.9°F | Resp 16 | Ht 62.0 in | Wt 120.6 lb

## 2020-06-09 DIAGNOSIS — I10 Essential (primary) hypertension: Secondary | ICD-10-CM

## 2020-06-09 DIAGNOSIS — I480 Paroxysmal atrial fibrillation: Secondary | ICD-10-CM | POA: Diagnosis not present

## 2020-06-09 DIAGNOSIS — R600 Localized edema: Secondary | ICD-10-CM

## 2020-06-09 DIAGNOSIS — I739 Peripheral vascular disease, unspecified: Secondary | ICD-10-CM

## 2020-06-09 DIAGNOSIS — E782 Mixed hyperlipidemia: Secondary | ICD-10-CM | POA: Diagnosis not present

## 2020-06-09 DIAGNOSIS — J439 Emphysema, unspecified: Secondary | ICD-10-CM | POA: Diagnosis not present

## 2020-06-09 DIAGNOSIS — R002 Palpitations: Secondary | ICD-10-CM | POA: Diagnosis not present

## 2020-06-09 MED ORDER — LOSARTAN POTASSIUM 100 MG PO TABS: 100.0000 mg | ORAL_TABLET | Freq: Every day | ORAL | 1 refills | Status: DC

## 2020-06-09 MED ORDER — AMLODIPINE BESYLATE 5 MG PO TABS: 5.0000 mg | ORAL_TABLET | Freq: Every day | ORAL | 1 refills | Status: DC

## 2020-06-09 NOTE — Patient Instructions (Addendum)
For your blood pressure:   Continue 5 mg amlodipine daily along with losartan and clonidine   Take extra 5 mg if blood pressure is 160 or higher   Cut the torsemide in half for dosing

## 2020-06-09 NOTE — Assessment & Plan Note (Signed)
For ABI's Nov 4. Denies claudication,  Defers statin

## 2020-06-09 NOTE — Assessment & Plan Note (Signed)
continue asa/plavix.  Statin deferred due to age and current LDL < 100 

## 2020-06-09 NOTE — Assessment & Plan Note (Signed)
Managed with compression stockings  

## 2020-06-09 NOTE — Progress Notes (Addendum)
Subjective:  [name], female    DOB: Jan 28, 1925  Age: 86 y.o. MRN: [id number]  CC: The primary encounter diagnosis was Palpitations. Diagnoses of Peripheral vascular disease ([place]), Mixed hyperlipidemia, Benign essential hypertension, Edema of left lower extremity, Paroxysmal A-fib ([place]), and Pulmonary emphysema, unspecified emphysema type ([place]) were also pertinent to this visit.  HPI Deanna White presents for follow up on labile hypertension.  Accompanied by daughter Deanna White  This visit occurred during the SARS-CoV-2 public health emergency.  Safety protocols were in place, including screening questions prior to the visit, additional usage of staff PPE, and extensive cleaning of exam room while observing appropriate contact time as indicated for disinfecting solutions.    Patient has received both doses of the available COVID 19 vaccine without complications.  Patient continues to mask when outside of the home except when walking in yard or at safe distances from others .  Patient denies any change in mood or development of unhealthy behaviors resuting from the pandemic's restriction of activities and socialization.    Amlodipine dose was increased to 10 mg daily one month ago.   She reduced the dose to 5 mg daily almost immediately due to intolerance of dose("makes me feel bad") malthough her home BP readings never dropped  below 342 systolic.   Most readings from home have been 140 or less .  Wearing compression stockings for edema secondary to VI.  Discussed her desire to use torsemide 10 mg.    Outpatient Medications Prior to Visit  Medication Sig Dispense Refill  . aspirin EC 81 MG tablet Take 81 mg by mouth at bedtime.     . cloNIDine (CATAPRES) 0.1 MG tablet Take 1 tablet (0.1 mg total) by mouth 2 (two) times daily. 180 tablet 0  . clopidogrel (PLAVIX) 75 MG tablet TAKE 1 TABLET BY MOUTH EVERY DAY (Patient taking differently: Take 75 mg by mouth daily. ) 90 tablet 1   . dorzolamide (TRUSOPT) 2 % ophthalmic solution Place 1 drop into both eyes 3 (three) times daily.   12  . levothyroxine (SYNTHROID) 75 MCG tablet TAKE 1 TABLET BY MOUTH EVERY DAY (Patient taking differently: Take 75 mcg by mouth daily before breakfast. ) 90 tablet 1  . Multiple Vitamins-Minerals (PRESERVISION AREDS 2) CAPS Take 1 capsule by mouth 2 (two) times daily.     Marland Kitchen amLODipine (NORVASC) 10 MG tablet Take 1 tablet (10 mg total) by mouth daily. (Patient taking differently: Take 10 mg by mouth daily. ) 90 tablet 1  . losartan (COZAAR) 100 MG tablet TAKE 1 TABLET BY MOUTH EVERY DAY (Patient taking differently: Take 100 mg by mouth daily. ) 90 tablet 1   No facility-administered medications prior to visit.    Review of Systems;  Patient denies headache, fevers, malaise, unintentional weight loss, skin rash, eye pain, sinus congestion and sinus pain, sore throat, dysphagia,  hemoptysis , cough, dyspnea, wheezing, chest pain, palpitations, orthopnea, edema, abdominal pain, nausea, melena, diarrhea, constipation, flank pain, dysuria, hematuria, urinary  Frequency, nocturia, numbness, tingling, seizures,  Focal weakness, Loss of consciousness,  Tremor, insomnia, depression, anxiety, and suicidal ideation.      Objective:  BP (!) 172/62 (BP Location: Left Arm, Patient Position: Sitting, Cuff Size: Normal)   Pulse 70   Temp 97.9 F (36.6 C) (Oral)   Resp 16   Ht 5\' 2"  (1.575 m)   Wt 120 lb 9.6 oz (54.7 kg)   SpO2 99%   BMI 22.06 kg/m  BP Readings from Last 3 Encounters:  06/09/20 (!) 172/62  05/07/20 (!) 180/68  05/05/20 (!) 198/76    Wt Readings from Last 3 Encounters:  06/09/20 120 lb 9.6 oz (54.7 kg)  05/07/20 119 lb 3.2 oz (54.1 kg)  05/05/20 118 lb (53.5 kg)    General appearance: alert, cooperative and appears stated age Ears: normal TM's and external ear canals both ears Throat: lips, mucosa, and tongue normal; teeth and gums normal Neck: no adenopathy, no carotid  bruit, supple, symmetrical, trachea midline and thyroid not enlarged, symmetric, no tenderness/mass/nodules Back: symmetric, no curvature. ROM normal. No CVA tenderness. Lungs: clear to auscultation bilaterally Heart: regular rate and rhythm, S1, S2 normal, no murmur, click, rub or gallop Abdomen: soft, non-tender; bowel sounds normal; no masses,  no organomegaly Pulses: 2+ and symmetric Skin: Skin color, texture, turgor normal. No rashes or lesions Lymph nodes: Cervical, supraclavicular, and axillary nodes normal.  No results found for: HGBA1C  Lab Results  Component Value Date   CREATININE 0.65 05/05/2020   CREATININE 0.80 05/05/2020   CREATININE 0.90 04/19/2020    Lab Results  Component Value Date   WBC 6.4 05/05/2020   HGB 13.4 05/05/2020   HCT 40.1 05/05/2020   PLT 190 05/05/2020   GLUCOSE 96 05/05/2020   CHOL 173 04/19/2020   TRIG 72.0 04/19/2020   HDL 71.80 04/19/2020   LDLDIRECT 76.0 08/31/2016   LDLCALC 87 04/19/2020   ALT 9 04/19/2020   AST 18 04/19/2020   NA 139 05/05/2020   K 4.1 05/05/2020   CL 104 05/05/2020   CREATININE 0.65 05/05/2020   BUN 16 05/05/2020   CO2 23 05/05/2020   TSH 2.26 04/19/2020   MICROALBUR <0.7 10/16/2017    DG Chest 2 View  Result Date: 05/05/2020 CLINICAL DATA:  86 year old female with shortness of breath EXAM: CHEST - 2 VIEW COMPARISON:  Chest radiograph dated 09/27/2018 FINDINGS: There are small bilateral pleural effusions with bibasilar atelectasis or infiltrate. Overall increase in the degree of pleural effusions and associated atelectasis compared to the prior radiograph. There is background of emphysema. No pneumothorax. Stable cardiac silhouette. Atherosclerotic calcification of the aorta. No acute osseous pathology. Osteopenia. Old lower thoracic compression fracture. IMPRESSION: Small bilateral pleural effusions with bibasilar atelectasis or infiltrate. Electronically Signed   By: Anner Crete M.D.   On: 05/05/2020 17:44     Assessment & Plan:   Problem List Items Addressed This Visit      Unprioritized   Peripheral vascular disease ([place])    For ABI's Nov 4. Denies claudication,  Defers statin       Relevant Medications   amLODipine (NORVASC) 5 MG tablet   losartan (COZAAR) 100 MG tablet   Paroxysmal A-fib (HCC)    Has a history of paroxysmal atrial fib. No recent symptoms       Relevant Medications   amLODipine (NORVASC) 5 MG tablet   losartan (COZAAR) 100 MG tablet   Mixed hyperlipidemia    continue asa/plavix.  Statin deferred due to age and current LDL < 100      Relevant Medications   amLODipine (NORVASC) 5 MG tablet   losartan (COZAAR) 100 MG tablet   Emphysema, unspecified ([place])    Noted on chest x ray done in August to evaluate for shortness of breath during Er visit for hypertensive urgency.  She is currently asymptomatic      Edema of left lower extremity    Managed with compression stockings.  Benign essential hypertension    She did not tolerate BP of 323 systolic on amlodipine 10 mg.  Readings are < 140 mostly,  On 5 mg amlodipine 100 mg losartan and clonidine 0.1 mg bid . Advised to add extra 5 mg for systolic 557 or higher       Relevant Medications   amLODipine (NORVASC) 5 MG tablet   losartan (COZAAR) 100 MG tablet    Other Visit Diagnoses    Palpitations    -  Primary      I have changed Deanna White's amLODipine and losartan. I am also having her maintain her aspirin EC, PreserVision AREDS 2, dorzolamide, levothyroxine, cloNIDine, and clopidogrel.  Meds ordered this encounter  Medications  . amLODipine (NORVASC) 5 MG tablet    Sig: Take 1 tablet (5 mg total) by mouth daily. Take an  additional 5 mg prn BP > 160    Dispense:  120 tablet    Refill:  1    KEEP ON FILE FOR FUTURE REFILLS  . losartan (COZAAR) 100 MG tablet    Sig: Take 1 tablet (100 mg total) by mouth daily.    Dispense:  90 tablet    Refill:  1    Medications Discontinued During  This Encounter  Medication Reason  . amLODipine (NORVASC) 10 MG tablet   . losartan (COZAAR) 100 MG tablet Reorder    Follow-up: No follow-ups on file.   Crecencio Mc, MD

## 2020-06-09 NOTE — Assessment & Plan Note (Signed)
She did not tolerate BP of 115 systolic on amlodipine 10 mg.  Readings are < 140 mostly,  On 5 mg amlodipine 100 mg losartan and clonidine 0.1 mg bid . Advised to add extra 5 mg for systolic 726 or higher

## 2020-06-10 DIAGNOSIS — J439 Emphysema, unspecified: Secondary | ICD-10-CM | POA: Insufficient documentation

## 2020-06-10 NOTE — Assessment & Plan Note (Signed)
Has a history of paroxysmal atrial fib. No recent symptoms

## 2020-06-10 NOTE — Assessment & Plan Note (Signed)
Noted on chest x ray done in August to evaluate for shortness of breath during Er visit for hypertensive urgency.  She is currently asymptomatic

## 2020-07-07 ENCOUNTER — Other Ambulatory Visit: Payer: Self-pay | Admitting: Internal Medicine

## 2020-07-16 ENCOUNTER — Telehealth: Payer: Self-pay

## 2020-07-16 NOTE — Telephone Encounter (Signed)
Chart has been updated.

## 2020-07-16 NOTE — Telephone Encounter (Signed)
Pt said she had her flu vaccine at [facility] [address] on 07/13/20.

## 2020-07-25 ENCOUNTER — Other Ambulatory Visit: Payer: Self-pay | Admitting: Cardiovascular Disease

## 2020-07-29 ENCOUNTER — Other Ambulatory Visit: Payer: Self-pay | Admitting: Internal Medicine

## 2020-08-05 ENCOUNTER — Ambulatory Visit (INDEPENDENT_AMBULATORY_CARE_PROVIDER_SITE_OTHER): Payer: Medicare PPO | Admitting: Nurse Practitioner

## 2020-08-05 ENCOUNTER — Ambulatory Visit (INDEPENDENT_AMBULATORY_CARE_PROVIDER_SITE_OTHER): Payer: Medicare PPO

## 2020-08-05 ENCOUNTER — Other Ambulatory Visit: Payer: Self-pay

## 2020-08-05 VITALS — BP 197/72 | HR 66 | Ht 62.0 in | Wt 119.0 lb

## 2020-08-05 DIAGNOSIS — I7025 Atherosclerosis of native arteries of other extremities with ulceration: Secondary | ICD-10-CM

## 2020-08-05 DIAGNOSIS — E782 Mixed hyperlipidemia: Secondary | ICD-10-CM | POA: Diagnosis not present

## 2020-08-05 DIAGNOSIS — I10 Essential (primary) hypertension: Secondary | ICD-10-CM

## 2020-08-05 MED ORDER — MUPIROCIN 2 % EX OINT: 1.0000 "application " | TOPICAL_OINTMENT | Freq: Every day | CUTANEOUS | 0 refills | Status: DC

## 2020-08-08 ENCOUNTER — Encounter (INDEPENDENT_AMBULATORY_CARE_PROVIDER_SITE_OTHER): Payer: Self-pay | Admitting: Nurse Practitioner

## 2020-08-08 NOTE — Progress Notes (Addendum)
Subjective:    [name], female    DOB: July 07, 1925, 86 y.o.   MRN: [id number] Chief Complaint  Patient presents with  . Follow-up    5mo U/S follow up    The patient returns to the office for followup and review of the noninvasive studies. There have been no interval changes in lower extremity symptoms. No interval shortening of the patient's claudication distance or development of rest pain symptoms.  While the patient has had no new wounds or ulcers since her last visit, the previous wound that she had in December still continues to heal.  It is very small less than 1 cm in size.  The patient notes that it also has some scant amount of drainage daily.  She continues to keep wound covered and places Vaseline on a daily.  There have been no significant changes to the patient's overall health care.  The patient denies amaurosis fugax or recent TIA symptoms. There are no recent neurological changes noted. The patient denies history of DVT, PE or superficial thrombophlebitis. The patient denies recent episodes of angina or shortness of breath.   ABI Rt= 0.71 and Lt= 0.98 (previous ABI's Rt= 0.85 and Lt= 0.99) Duplex ultrasound of the patient has monophasic tibial artery waveforms bilaterally with slightly dampened toe waveforms bilaterally.   Review of Systems  Skin: Positive for wound.  All other systems reviewed and are negative.      Objective:   Physical Exam Vitals reviewed.  HENT:     Head: Normocephalic.  Cardiovascular:     Rate and Rhythm: Normal rate.     Pulses:          Dorsalis pedis pulses are detected w/ Doppler on the right side and detected w/ Doppler on the left side.       Posterior tibial pulses are detected w/ Doppler on the right side and detected w/ Doppler on the left side.  Pulmonary:     Effort: Pulmonary effort is normal.  Neurological:     Mental Status: She is alert and oriented to person, place, and time. Mental status is at  baseline.  Psychiatric:        Mood and Affect: Mood normal.        Behavior: Behavior normal.        Thought Content: Thought content normal.        Judgment: Judgment normal.     BP (!) 197/72 (BP Location: Right Arm)   Pulse 66   Ht 5\' 2"  (1.575 m)   Wt 119 lb (54 kg)   BMI 21.77 kg/m   Past Medical History:  Diagnosis Date  . Actinic keratosis   . Allergy   . Carotid artery occlusion   . Colon polyps   . Fall from slipping on ice Feb. 2015  . Heart murmur   . Hypertension   . Menopause   . Pneumonia   . Positive TB test    pt denies  . Sore throat   . Squamous cell carcinoma of skin 04/17/2019   L post lateral ankle, shave removal 10/15/2019  . Thyroid disease   . Varicose veins     Social History   Socioeconomic History  . Marital status: Widowed    Spouse name: Not on file  . Number of children: Not on file  . Years of education: Not on file  . Highest education level: Not on file  Occupational History  . Not on file  Tobacco Use  . Smoking status: Never Smoker  . Smokeless tobacco: Never Used  Substance and Sexual Activity  . Alcohol use: No  . Drug use: No  . Sexual activity: Never  Other Topics Concern  . Not on file  Social History Narrative  . Not on file   Social Determinants of Health   Financial Resource Strain: Low Risk   . Difficulty of Paying Living Expenses: Not hard at all  Food Insecurity: No Food Insecurity  . Worried About Charity fundraiser in the Last Year: Never true  . Ran Out of Food in the Last Year: Never true  Transportation Needs: No Transportation Needs  . Lack of Transportation (Medical): No  . Lack of Transportation (Non-Medical): No  Physical Activity:   . Days of Exercise per Week: Not on file  . Minutes of Exercise per Session: Not on file  Stress: No Stress Concern Present  . Feeling of Stress : Only a little  Social Connections: Unknown  . Frequency of Communication with Friends and Family: More than  three times a week  . Frequency of Social Gatherings with Friends and Family: More than three times a week  . Attends Religious Services: Not on file  . Active Member of Clubs or Organizations: Not on file  . Attends Archivist Meetings: Not on file  . Marital Status: Not on file  Intimate Partner Violence: Not At Risk  . Fear of Current or Ex-Partner: No  . Emotionally Abused: No  . Physically Abused: No  . Sexually Abused: No    Past Surgical History:  Procedure Laterality Date  . ABDOMINAL HYSTERECTOMY    . APPENDECTOMY    . BILATERAL SALPINGOOPHORECTOMY  1964  . CESAREAN SECTION    . EYE SURGERY Right March 2016   Cataract  . LOWER EXTREMITY ANGIOGRAPHY Left 03/14/2019   Procedure: LOWER EXTREMITY ANGIOGRAPHY;  Surgeon: Katha Cabal, MD;  Location: [facility];  Service: Cardiovascular;  Laterality: Left;  . TONSILLECTOMY    . VEIN SURGERY Left    Leg    Family History  Problem Relation Age of Onset  . Heart disease Mother        After age 40  . Hypertension Mother   . Deep vein thrombosis Mother   . Heart attack Mother   . Kidney disease Father   . Alcohol abuse Brother   . Heart disease Brother   . Cancer Brother        Lung cancer  . Cancer Brother        kidney cancer  . Varicose Veins Sister   . Heart disease Sister        After age 36  . Heart attack Sister   . Heart disease Sister        After age 74  . Other Other        epilepsy  . Hypertension Other   . Cancer Paternal Grandmother        breast cancer    Allergies  Allergen Reactions  . Hctz [Hydrochlorothiazide] Other (See Comments)    Hyponatremia   . Adhesive [Tape] Rash    Peels skin  . Brimonidine Rash and Other (See Comments)    Pt felt as if throat was closing up, pt also had rash    CBC Latest Ref Rng & Units 05/05/2020 04/19/2020 09/17/2018  WBC 4.0 - 10.5 K/uL 6.4 6.9 6.9  Hemoglobin 12.0 - 15.0 g/dL 13.4 12.0  11.2(L)  Hematocrit 36 - 46 % 40.1 36.1  33.2(L)  Platelets 150 - 400 K/uL 190 202.0 235.0      CMP     Component Value Date/Time   NA 139 05/05/2020 1449   NA 137 12/04/2018 0000   K 4.1 05/05/2020 1449   CL 104 05/05/2020 1449   CO2 23 05/05/2020 1449   GLUCOSE 96 05/05/2020 1449   BUN 16 05/05/2020 1449   BUN 16 12/04/2018 0000   CREATININE 0.65 05/05/2020 1449   CALCIUM 9.4 05/05/2020 1449   PROT 6.2 04/19/2020 0947   ALBUMIN 4.3 04/19/2020 0947   AST 18 04/19/2020 0947   ALT 9 04/19/2020 0947   ALKPHOS 87 04/19/2020 0947   BILITOT 0.5 04/19/2020 0947   GFRNONAA >60 05/05/2020 1449   GFRAA >60 05/05/2020 1449       Assessment & Plan:   1. Atherosclerosis of native arteries of the extremities with ulceration ([place]) The ulceration that the patient previously had in December continues to be present on the left lateral calf area.  It has significantly decreased since her angiogram.  Today I discussed the possibility of repeat angiogram because of nonhealing.  Prior to proceeding with angiogram we will try different wound care treatment to see if it improved healing on patient's wound.  If there is progression of wound healing we will proceed conservatively with treatment of PAD.  However if wound continues to persist we may need to pursue intervention. - mupirocin ointment (BACTROBAN) 2 %; Apply 1 application topically daily.  Dispense: 22 g; Refill: 0  2. Benign essential hypertension Continue antihypertensive medications as already ordered, these medications have been reviewed and there are no changes at this time.   3. Mixed hyperlipidemia Continue statin as ordered and reviewed, no changes at this time    Current Outpatient Medications on File Prior to Visit  Medication Sig Dispense Refill  . amLODipine (NORVASC) 5 MG tablet TAKE 1 TABLET BY MOUTH EVERY DAY 90 tablet 1  . aspirin EC 81 MG tablet Take 81 mg by mouth at bedtime.     . cloNIDine (CATAPRES) 0.1 MG tablet TAKE 1 TABLET (0.1 MG TOTAL) BY MOUTH  2 (TWO) TIMES DAILY. 180 tablet 2  . clopidogrel (PLAVIX) 75 MG tablet TAKE 1 TABLET BY MOUTH EVERY DAY (Patient taking differently: Take 75 mg by mouth daily. ) 90 tablet 1  . dorzolamide (TRUSOPT) 2 % ophthalmic solution Place 1 drop into both eyes 3 (three) times daily.   12  . levothyroxine (SYNTHROID) 75 MCG tablet TAKE 1 TABLET BY MOUTH EVERY DAY 90 tablet 1  . losartan (COZAAR) 100 MG tablet Take 1 tablet (100 mg total) by mouth daily. 90 tablet 1  . Multiple Vitamins-Minerals (PRESERVISION AREDS 2) CAPS Take 1 capsule by mouth 2 (two) times daily.      No current facility-administered medications on file prior to visit.    There are no Patient Instructions on file for this visit. No follow-ups on file.   Kris Hartmann, NP

## 2020-08-11 DIAGNOSIS — H469 Unspecified optic neuritis: Secondary | ICD-10-CM | POA: Diagnosis not present

## 2020-08-11 DIAGNOSIS — H40013 Open angle with borderline findings, low risk, bilateral: Secondary | ICD-10-CM | POA: Diagnosis not present

## 2020-08-11 DIAGNOSIS — H25811 Combined forms of age-related cataract, right eye: Secondary | ICD-10-CM | POA: Diagnosis not present

## 2020-08-11 DIAGNOSIS — H353112 Nonexudative age-related macular degeneration, right eye, intermediate dry stage: Secondary | ICD-10-CM | POA: Diagnosis not present

## 2020-09-06 ENCOUNTER — Ambulatory Visit (INDEPENDENT_AMBULATORY_CARE_PROVIDER_SITE_OTHER): Payer: Medicare PPO | Admitting: Vascular Surgery

## 2020-09-27 ENCOUNTER — Ambulatory Visit (INDEPENDENT_AMBULATORY_CARE_PROVIDER_SITE_OTHER): Payer: Medicare PPO

## 2020-09-27 VITALS — BP 160/75 | Ht 62.0 in | Wt 119.0 lb

## 2020-09-27 DIAGNOSIS — Z0000 Encounter for general adult medical examination without abnormal findings: Secondary | ICD-10-CM | POA: Diagnosis not present

## 2020-09-27 NOTE — Progress Notes (Addendum)
Subjective:   Deanna White is a 86 y.o. female who presents for Medicare Annual (Subsequent) preventive examination.  Review of Systems    No ROS.  Medicare Wellness Virtual Visit.   Cardiac Risk Factors include: advanced age (>40men, >78 women);hypertension     Objective:    Today's Vitals   09/27/20 0906  BP: (!) 160/75  Weight: 119 lb (54 kg)  Height: 5\' 2"  (1.575 m)   Body mass index is 21.77 kg/m.  Advanced Directives 09/27/2020 05/05/2020 09/24/2019 09/17/2018 07/27/2018 11/11/2017 08/01/2017  Does Patient Have a Medical Advance Directive? No No Yes Yes No Yes No  Type of Advance Directive - Public librarian;Living will [place] - - -  Does patient want to make changes to medical advance directive? - - No - Patient declined No - Patient declined - - -  Copy of [place] in Chart? - - No - copy requested No - copy requested - - -  Would patient like information on creating a medical advance directive? No - Patient declined - - - No - Patient declined - Yes (MAU/Ambulatory/Procedural Areas - Information given)    Current Medications (verified) Outpatient Encounter Medications as of 09/27/2020  Medication Sig   amLODipine (NORVASC) 5 MG tablet TAKE 1 TABLET BY MOUTH EVERY DAY   aspirin EC 81 MG tablet Take 81 mg by mouth at bedtime.    cloNIDine (CATAPRES) 0.1 MG tablet TAKE 1 TABLET (0.1 MG TOTAL) BY MOUTH 2 (TWO) TIMES DAILY.   clopidogrel (PLAVIX) 75 MG tablet TAKE 1 TABLET BY MOUTH EVERY DAY (Patient taking differently: Take 75 mg by mouth daily. )   dorzolamide (TRUSOPT) 2 % ophthalmic solution Place 1 drop into both eyes 3 (three) times daily.    levothyroxine (SYNTHROID) 75 MCG tablet TAKE 1 TABLET BY MOUTH EVERY DAY   losartan (COZAAR) 100 MG tablet Take 1 tablet (100 mg total) by mouth daily.   Multiple Vitamins-Minerals (PRESERVISION AREDS 2) CAPS Take 1 capsule by mouth 2 (two) times daily.    mupirocin  ointment (BACTROBAN) 2 % Apply 1 application topically daily.   No facility-administered encounter medications on file as of 09/27/2020.    Allergies (verified) Hctz [hydrochlorothiazide], Adhesive [tape], and Brimonidine   History: Past Medical History:  Diagnosis Date   Actinic keratosis    Allergy    Carotid artery occlusion    Colon polyps    Fall from slipping on ice Feb. 2015   Heart murmur    Hypertension    Menopause    Pneumonia    Positive TB test    pt denies   Sore throat    Squamous cell carcinoma of skin 04/17/2019   L post lateral ankle, shave removal 10/15/2019   Thyroid disease    Varicose veins    Past Surgical History:  Procedure Laterality Date   ABDOMINAL HYSTERECTOMY     APPENDECTOMY     BILATERAL SALPINGOOPHORECTOMY  1964   CESAREAN SECTION     EYE SURGERY Right March 2016   Cataract   LOWER EXTREMITY ANGIOGRAPHY Left 03/14/2019   Procedure: LOWER EXTREMITY ANGIOGRAPHY;  Surgeon: Katha Cabal, MD;  Location: [facility];  Service: Cardiovascular;  Laterality: Left;   TONSILLECTOMY     VEIN SURGERY Left    Leg   Family History  Problem Relation Age of Onset   Heart disease Mother        After age 63  Hypertension Mother    Deep vein thrombosis Mother    Heart attack Mother    Kidney disease Father    Alcohol abuse Brother    Heart disease Brother    Cancer Brother        Lung cancer   Cancer Brother        kidney cancer   Varicose Veins Sister    Heart disease Sister        After age 91   Heart attack Sister    Heart disease Sister        After age 71   Other Other        epilepsy   Hypertension Other    Cancer Paternal Grandmother        breast cancer   Cancer Daughter    Social History   Socioeconomic History   Marital status: Widowed    Spouse name: Not on file   Number of children: Not on file   Years of education: Not on file   Highest education level: Not on file  Occupational History   Not on  file  Tobacco Use   Smoking status: Never Smoker   Smokeless tobacco: Never Used  Substance and Sexual Activity   Alcohol use: No   Drug use: No   Sexual activity: Never  Other Topics Concern   Not on file  Social History Narrative   Not on file   Social Determinants of Health   Financial Resource Strain: Low Risk    Difficulty of Paying Living Expenses: Not hard at all  Food Insecurity: No Food Insecurity   Worried About [place] in the Last Year: Never true   [place] in the Last Year: Never true  Transportation Needs: No Transportation Needs   Lack of Transportation (Medical): No   Lack of Transportation (Non-Medical): No  Physical Activity: Not on file  Stress: No Stress Concern Present   Feeling of Stress : Not at all  Social Connections: Unknown   Frequency of Communication with Friends and Family: More than three times a week   Frequency of Social Gatherings with Friends and Family: More than three times a week   Attends Religious Services: Not on Electrical engineer or Organizations: Not on file   Attends Archivist Meetings: Not on file   Marital Status: Not on file    Tobacco Counseling Counseling given: Not Answered   Clinical Intake:  Pre-visit preparation completed: Yes        Diabetes: No  How often do you need to have someone help you when you read instructions, pamphlets, or other written materials from your doctor or pharmacy?: 1 - Never Interpreter Needed?: No      Activities of Daily Living In your present state of health, do you have any difficulty performing the following activities: 09/27/2020  Hearing? Y  Vision? N  Difficulty concentrating or making decisions? N  Walking or climbing stairs? N  Dressing or bathing? N  Doing errands, shopping? N  Preparing Food and eating ? N  Using the Toilet? N  In the past six months, have you accidently leaked urine? N  Comment Managed with liner/pad at  bedtime  Do you have problems with loss of bowel control? N  Managing your Medications? N  Managing your Finances? N  Housekeeping or managing your Housekeeping? N  Some recent data might be hidden    Patient Care Team: Derrel Nip,  Aris Everts, MD as PCP - General (Internal Medicine) Corey Skains, MD as Consulting Physician (Cardiology)  Indicate any recent Medical Services you may have received from other than [facility] providers in the past year (date may be approximate).     Assessment:   This is a routine wellness examination for Nikaila.  I connected with Amber today by telephone and verified that I am speaking with the correct person using two identifiers. Location patient: home Location provider: work Persons participating in the virtual visit: patient, Marine scientist.    I discussed the limitations, risks, security and privacy concerns of performing an evaluation and management service by telephone and the availability of in person appointments. The patient expressed understanding and verbally consented to this telephonic visit.    Interactive audio and video telecommunications were attempted between this provider and patient, however failed, due to patient having technical difficulties OR patient did not have access to video capability.  We continued and completed visit with audio only.  Some vital signs may be absent or patient reported.   Hearing/Vision screen  Hearing Screening   125Hz  250Hz  500Hz  1000Hz  2000Hz  3000Hz  4000Hz  6000Hz  8000Hz   Right ear:           Left ear:           Comments: Followed by [facility]  Left ear hearing aid   Vision Screening Comments: Followed by [facility]  Wears corrective lenses  OV every 6 months  Cataract extraction, L eye only  Virtual visit.   Dietary issues and exercise activities discussed: Current Exercise Habits: Home exercise routine, Type of exercise: walking, Intensity: Mild  Vegetarian Good water  intake  Goals      Follow up with Primary Care Provider     As needed       Depression Screen PHQ 2/9 Scores 09/27/2020 09/24/2019 09/17/2018 08/01/2017 03/01/2016 05/28/2014 02/20/2014  PHQ - 2 Score 0 0 0 0 0 0 0  PHQ- 9 Score - - - 0 - - -    Fall Risk Fall Risk  09/27/2020 06/09/2020 05/07/2020 04/19/2020 09/24/2019  Falls in the past year? 0 0 0 0 0  Number falls in past yr: 0 - - - -  Injury with Fall? 0 - - - -  Risk Factor Category  - - - - -  Risk for fall due to : - - - - -  Follow up Falls evaluation completed Falls evaluation completed Falls evaluation completed Falls evaluation completed Falls evaluation completed    FALL RISK PREVENTION PERTAINING TO THE HOME: Handrails in use when climbing stairs? Yes Home free of loose throw rugs in walkways, pet beds, electrical cords, etc? Yes  Adequate lighting in your home to reduce risk of falls? Yes   ASSISTIVE DEVICES UTILIZED TO PREVENT FALLS: Life alert? Yes  Use of a cane, walker or w/c? Yes . Walking sticks in use when walking. Grab bars in the bathroom? No  Shower chair or bench in shower? Yes  Elevated toilet seat or a handicapped toilet? Yes   TIMED UP AND GO: Was the test performed? No . Virtual visit.  Cognitive Function:  Patient is alert and oriented x3.  Denies difficulty focusing, concentrating, memory loss.  Enjoys brain engagement games, reading, crossword puzzles for brain health exercises.    6CIT Screen 09/27/2020 09/24/2019 09/17/2018 08/01/2017  What Year? 0 points 0 points 0 points 0 points  What month? 0 points 0 points 0 points 0  points  What time? 0 points 0 points 0 points 0 points  Count back from 20 0 points 0 points 0 points 0 points  Months in reverse 0 points 0 points 0 points 0 points  Repeat phrase 0 points 2 points 0 points 0 points  Total Score 0 2 0 0    Immunizations Immunization History  Administered Date(s) Administered   Fluad Quad(high Dose 65+) 06/18/2019   Influenza  Split 07/01/2014   Influenza, High Dose Seasonal PF 07/13/2020   Influenza-Unspecified 07/05/2016, 07/04/2017, 06/29/2018   PFIZER SARS-COV-2 Vaccination 10/08/2019, 10/29/2019, 08/16/2020   Pneumococcal Conjugate-13 02/20/2014   Pneumococcal Polysaccharide-23 10/03/2003, 02/22/2015   Td 10/02/2006   Tdap 02/05/2018   Health Maintenance Health Maintenance  Topic Date Due   COVID-19 Vaccine (4 - Booster for Pfizer series) 02/13/2021   TETANUS/TDAP  02/06/2028   INFLUENZA VACCINE  Completed   DEXA SCAN  Completed   PNA vac Low Risk Adult  Completed   Lung Cancer Screening: (Low Dose CT Chest recommended if Age 36-80 years, 30 pack-year currently smoking OR have quit w/in 15years.) does not qualify.   Hepatitis C Screening: does not qualify.  Vision Screening: Recommended annual ophthalmology exams for early detection of glaucoma and other disorders of the eye. Is the patient up to date with their annual eye exam?  Yes  Who is the provider or what is the name of the office in which the patient attends annual eye exams? Dr. Chales Abrahams and Dr. Everlena Cooper, [facility]. Visits every 6 months.   Dental Screening: Recommended annual dental exams for proper oral hygiene.   Community Resource Referral / Chronic Care Management: CRR required this visit?  No   CCM required this visit?  No      Plan:   Keep all routine maintenance appointments.   Follow up 12/08/20 @ 10:30  I have personally reviewed and noted the following in the patient's chart:   Medical and social history Use of alcohol, tobacco or illicit drugs  Current medications and supplements Functional ability and status Nutritional status Physical activity Advanced directives List of other physicians Hospitalizations, surgeries, and ER visits in previous 12 months Vitals Screenings to include cognitive, depression, and falls Referrals and appointments  In addition, I have reviewed and discussed with patient certain  preventive protocols, quality metrics, and best practice recommendations. A written personalized care plan for preventive services as well as general preventive health recommendations were provided to patient.     OBrien-Blaney, Xzavior Reinig L, LPN   [id number]      I have reviewed the above information and agree with above.   Duncan Dull, MD

## 2020-09-27 NOTE — Patient Instructions (Addendum)
Deanna White , Thank you for taking time to come for your Medicare Wellness Visit. I appreciate your ongoing commitment to your health goals. Please review the following plan we discussed and let me know if I can assist you in the future.   These are the goals we discussed: Goals     Follow up with Primary Care Provider     As needed       This is a list of the screening recommended for you and due dates:  Health Maintenance  Topic Date Due   COVID-19 Vaccine (4 - Booster for Pfizer series) 02/13/2021   Tetanus Vaccine  02/06/2028   Flu Shot  Completed   DEXA scan (bone density measurement)  Completed   Pneumonia vaccines  Completed    Immunizations Immunization History  Administered Date(s) Administered   Fluad Quad(high Dose 65+) 06/18/2019   Influenza Split 07/01/2014   Influenza, High Dose Seasonal PF 07/13/2020   Influenza-Unspecified 07/05/2016, 07/04/2017, 06/29/2018   PFIZER SARS-COV-2 Vaccination 10/08/2019, 10/29/2019, 08/16/2020   Pneumococcal Conjugate-13 02/20/2014   Pneumococcal Polysaccharide-23 10/03/2003, 02/22/2015   Td 10/02/2006   Keep all routine maintenance appointments.   Follow up 12/08/20 @ 10:30  Advanced directives: not yet completed   Conditions/risks identified: none new  Follow up in one year for your annual wellness visit.   Preventive Care 16 Years and Older, Female Preventive care refers to lifestyle choices and visits with your health care provider that can promote health and wellness. What does preventive care include?  A yearly physical exam. This is also called an annual well check.  Dental exams once or twice a year.  Routine eye exams. Ask your health care provider how often you should have your eyes checked.  Personal lifestyle choices, including:  Daily care of your teeth and gums.  Regular physical activity.  Eating a healthy diet.  Avoiding tobacco and drug use.  Limiting alcohol use.  Practicing  safe sex.  Taking low-dose aspirin every day.  Taking vitamin and mineral supplements as recommended by your health care provider. What happens during an annual well check? The services and screenings done by your health care provider during your annual well check will depend on your age, overall health, lifestyle risk factors, and family history of disease. Counseling  Your health care provider may ask you questions about your:  Alcohol use.  Tobacco use.  Drug use.  Emotional well-being.  Home and relationship well-being.  Sexual activity.  Eating habits.  History of falls.  Memory and ability to understand (cognition).  Work and work Statistician.  Reproductive health. Screening  You may have the following tests or measurements:  Height, weight, and BMI.  Blood pressure.  Lipid and cholesterol levels. These may be checked every 5 years, or more frequently if you are over 60 years old.  Skin check.  Lung cancer screening. You may have this screening every year starting at age 33 if you have a 30-pack-year history of smoking and currently smoke or have quit within the past 15 years.  Fecal occult blood test (FOBT) of the stool. You may have this test every year starting at age 57.  Flexible sigmoidoscopy or colonoscopy. You may have a sigmoidoscopy every 5 years or a colonoscopy every 10 years starting at age 65.  Hepatitis C blood test.  Hepatitis B blood test.  Sexually transmitted disease (STD) testing.  Diabetes screening. This is done by checking your blood sugar (glucose) after you have not eaten for  a while (fasting). You may have this done every 1-3 years.  Bone density scan. This is done to screen for osteoporosis. You may have this done starting at age 49.  Mammogram. This may be done every 1-2 years. Talk to your health care provider about how often you should have regular mammograms. Talk with your health care provider about your test results,  treatment options, and if necessary, the need for more tests. Vaccines  Your health care provider may recommend certain vaccines, such as:  Influenza vaccine. This is recommended every year.  Tetanus, diphtheria, and acellular pertussis (Tdap, Td) vaccine. You may need a Td booster every 10 years.  Zoster vaccine. You may need this after age 56.  Pneumococcal 13-valent conjugate (PCV13) vaccine. One dose is recommended after age 65.  Pneumococcal polysaccharide (PPSV23) vaccine. One dose is recommended after age 60. Talk to your health care provider about which screenings and vaccines you need and how often you need them. This information is not intended to replace advice given to you by your health care provider. Make sure you discuss any questions you have with your health care provider. Document Released: 10/15/2015 Document Revised: 06/07/2016 Document Reviewed: 07/20/2015 Elsevier Interactive Patient Education  2017 [employer].  Fall Prevention in the Home Falls can cause injuries. They can happen to people of all ages. There are many things you can do to make your home safe and to help prevent falls. What can I do on the outside of my home?  Regularly fix the edges of walkways and driveways and fix any cracks.  Remove anything that might make you trip as you walk through a door, such as a raised step or threshold.  Trim any bushes or trees on the path to your home.  Use bright outdoor lighting.  Clear any walking paths of anything that might make someone trip, such as rocks or tools.  Regularly check to see if handrails are loose or broken. Make sure that both sides of any steps have handrails.  Any raised decks and porches should have guardrails on the edges.  Have any leaves, snow, or ice cleared regularly.  Use sand or salt on walking paths during winter.  Clean up any spills in your garage right away. This includes oil or grease spills. What can I do in the  bathroom?  Use night lights.  Install grab bars by the toilet and in the tub and shower. Do not use towel bars as grab bars.  Use non-skid mats or decals in the tub or shower.  If you need to sit down in the shower, use a plastic, non-slip stool.  Keep the floor dry. Clean up any water that spills on the floor as soon as it happens.  Remove soap buildup in the tub or shower regularly.  Attach bath mats securely with double-sided non-slip rug tape.  Do not have throw rugs and other things on the floor that can make you trip. What can I do in the bedroom?  Use night lights.  Make sure that you have a light by your bed that is easy to reach.  Do not use any sheets or blankets that are too big for your bed. They should not hang down onto the floor.  Have a firm chair that has side arms. You can use this for support while you get dressed.  Do not have throw rugs and other things on the floor that can make you trip. What can I do in  the kitchen?  Clean up any spills right away.  Avoid walking on wet floors.  Keep items that you use a lot in easy-to-reach places.  If you need to reach something above you, use a strong step stool that has a grab bar.  Keep electrical cords out of the way.  Do not use floor polish or wax that makes floors slippery. If you must use wax, use non-skid floor wax.  Do not have throw rugs and other things on the floor that can make you trip. What can I do with my stairs?  Do not leave any items on the stairs.  Make sure that there are handrails on both sides of the stairs and use them. Fix handrails that are broken or loose. Make sure that handrails are as long as the stairways.  Check any carpeting to make sure that it is firmly attached to the stairs. Fix any carpet that is loose or worn.  Avoid having throw rugs at the top or bottom of the stairs. If you do have throw rugs, attach them to the floor with carpet tape.  Make sure that you have a  light switch at the top of the stairs and the bottom of the stairs. If you do not have them, ask someone to add them for you. What else can I do to help prevent falls?  Wear shoes that:  Do not have high heels.  Have rubber bottoms.  Are comfortable and fit you well.  Are closed at the toe. Do not wear sandals.  If you use a stepladder:  Make sure that it is fully opened. Do not climb a closed stepladder.  Make sure that both sides of the stepladder are locked into place.  Ask someone to hold it for you, if possible.  Clearly mark and make sure that you can see:  Any grab bars or handrails.  First and last steps.  Where the edge of each step is.  Use tools that help you move around (mobility aids) if they are needed. These include:  Canes.  Walkers.  Scooters.  Crutches.  Turn on the lights when you go into a dark area. Replace any light bulbs as soon as they burn out.  Set up your furniture so you have a clear path. Avoid moving your furniture around.  If any of your floors are uneven, fix them.  If there are any pets around you, be aware of where they are.  Review your medicines with your doctor. Some medicines can make you feel dizzy. This can increase your chance of falling. Ask your doctor what other things that you can do to help prevent falls. This information is not intended to replace advice given to you by your health care provider. Make sure you discuss any questions you have with your health care provider. Document Released: 07/15/2009 Document Revised: 02/24/2016 Document Reviewed: 10/23/2014 Elsevier Interactive Patient Education  2017 [employer].

## 2020-12-08 ENCOUNTER — Ambulatory Visit: Payer: Medicare PPO | Admitting: Internal Medicine

## 2020-12-09 ENCOUNTER — Other Ambulatory Visit: Payer: Self-pay

## 2020-12-09 ENCOUNTER — Encounter: Payer: Self-pay | Admitting: Podiatry

## 2020-12-09 ENCOUNTER — Ambulatory Visit: Payer: Medicare PPO | Admitting: Podiatry

## 2020-12-09 DIAGNOSIS — M79675 Pain in left toe(s): Secondary | ICD-10-CM

## 2020-12-09 DIAGNOSIS — Z961 Presence of intraocular lens: Secondary | ICD-10-CM

## 2020-12-09 DIAGNOSIS — M79674 Pain in right toe(s): Secondary | ICD-10-CM | POA: Diagnosis not present

## 2020-12-09 DIAGNOSIS — B351 Tinea unguium: Secondary | ICD-10-CM | POA: Diagnosis not present

## 2020-12-09 NOTE — Progress Notes (Signed)
This patient returns to my office for at risk foot care.  This patient requires this care by a professional since this patient will be at risk due to having venous insufficiency and PVD.    This patient is unable to cut nails herself since the patient cannot reach her nails.These nails are painful walking and wearing shoes.  She presents to the office with her daughter.  Patient says she has corns on her fifth toes both feet.  This patient presents for at risk foot care today.  General Appearance  Alert, conversant and in no acute stress.  Vascular  Dorsalis pedis pulses are  Weakly palpable  B/L.  Posterior tibial pulses are absent  B/L.  bilaterally.  Capillary return is within normal limits  bilaterally. Cold feet bilaterally.Absent digital hair.  Neurologic  Senn-Weinstein monofilament wire test within normal limits  bilaterally. Muscle power within normal limits bilaterally.  Nails Thick disfigured discolored nails with subungual debris  from hallux to fifth toes bilaterally. No evidence of bacterial infection or drainage bilaterally.  Orthopedic  No limitations of motion  feet .  No crepitus or effusions noted.  No bony pathology or digital deformities noted.HT 2-4  B/L.  Adductovarus fifth toes  B/L  Pain noted upon palpation inside border fifth toe right.  No corn noted.  Skin  normotropic skin with no porokeratosis noted bilaterally.  No signs of infections or ulcers noted.     Onychomycosis  Pain in right toes  Pain in left toes  Consent was obtained for treatment procedures.   Mechanical debridement of nails 1-5  bilaterally performed with a nail nipper.  Filed with dremel without incident. Padding dispensed to pad and separate 4/5 toes  B/L.   Return office visit   3 months                  Told patient to return for periodic foot care and evaluation due to potential at risk complications.   Gardiner Barefoot DPM

## 2020-12-21 DIAGNOSIS — H353112 Nonexudative age-related macular degeneration, right eye, intermediate dry stage: Secondary | ICD-10-CM | POA: Diagnosis not present

## 2020-12-21 DIAGNOSIS — H25811 Combined forms of age-related cataract, right eye: Secondary | ICD-10-CM | POA: Diagnosis not present

## 2020-12-21 DIAGNOSIS — H40013 Open angle with borderline findings, low risk, bilateral: Secondary | ICD-10-CM | POA: Diagnosis not present

## 2020-12-29 ENCOUNTER — Ambulatory Visit: Payer: Medicare PPO | Admitting: Internal Medicine

## 2020-12-29 ENCOUNTER — Other Ambulatory Visit: Payer: Self-pay

## 2020-12-29 ENCOUNTER — Encounter: Payer: Self-pay | Admitting: Internal Medicine

## 2020-12-29 VITALS — BP 160/76 | HR 69 | Resp 16 | Ht 62.0 in | Wt 123.4 lb

## 2020-12-29 DIAGNOSIS — I10 Essential (primary) hypertension: Secondary | ICD-10-CM

## 2020-12-29 DIAGNOSIS — E034 Atrophy of thyroid (acquired): Secondary | ICD-10-CM

## 2020-12-29 DIAGNOSIS — D649 Anemia, unspecified: Secondary | ICD-10-CM

## 2020-12-29 DIAGNOSIS — J439 Emphysema, unspecified: Secondary | ICD-10-CM | POA: Diagnosis not present

## 2020-12-29 DIAGNOSIS — E782 Mixed hyperlipidemia: Secondary | ICD-10-CM | POA: Diagnosis not present

## 2020-12-29 DIAGNOSIS — I700 Atherosclerosis of aorta: Secondary | ICD-10-CM | POA: Diagnosis not present

## 2020-12-29 DIAGNOSIS — I739 Peripheral vascular disease, unspecified: Secondary | ICD-10-CM | POA: Diagnosis not present

## 2020-12-29 NOTE — Assessment & Plan Note (Signed)
Reviewed findings of prior CT scan today..  Patient is not a good candidate for statin therapy due to age

## 2020-12-29 NOTE — Progress Notes (Signed)
Subjective:  [name], female    DOB: Feb 03, 1925  Age: 87 y.o. MRN: [id number]  CC: The primary encounter diagnosis was Hypothyroidism due to acquired atrophy of thyroid. Diagnoses of Aortic atherosclerosis ([place]), Mixed hyperlipidemia, Anemia, unspecified type, Benign essential hypertension, Peripheral vascular disease ([place]), and Pulmonary emphysema, unspecified emphysema type ([place]) were also pertinent to this visit.  HPI AOLANIS CRISPEN presents for follow up on hypertension,  Hypothyroidism and PAD with aortic atherosclerosis .  She is accompanied by her aide.   Emphysema: she feels generally well, has good energy and denies dyspnea at rest and with mild exertion..  No cough.     Reports periodically having mild to moderate low and mid back pain after spending 2 hours in the yard weeding by hand. , Weed eating and trimming her bushes.  Her pain has resolved.  She enjoys yardwork and plans to continue it.   HTN:  Patient is taking her medications as prescribed and notes no adverse effects.  Home BP readings have been done about once per week and are all >  130/80  .  5 of 12 are > 140/90.  She is avoiding added salt in her diet and walking regularly  In her yard for exercise  .   Soc:  Wears a Financial controller.  Lives independently.  Had a fall going down the front step and landed hard on her rump  In December,  Occurred after a long day , was tired after celebrating hr birthday. Good appetite.  Prepares her own meals .  No microwaving .  Practices T[facility]   Occasional insomnia : has early wakeups after voiding .  Can't read at that time due to vision .   Outpatient Medications Prior to Visit  Medication Sig Dispense Refill  . amLODipine (NORVASC) 5 MG tablet TAKE 1 TABLET BY MOUTH EVERY DAY 90 tablet 1  . aspirin EC 81 MG tablet Take 81 mg by mouth at bedtime.    . cloNIDine (CATAPRES) 0.1 MG tablet TAKE 1 TABLET (0.1 MG TOTAL) BY MOUTH 2 (TWO) TIMES DAILY. 180 tablet 2  .  clopidogrel (PLAVIX) 75 MG tablet TAKE 1 TABLET BY MOUTH EVERY DAY (Patient taking differently: Take 75 mg by mouth daily.) 90 tablet 1  . dorzolamide (TRUSOPT) 2 % ophthalmic solution Place 1 drop into both eyes 3 (three) times daily.   12  . levothyroxine (SYNTHROID) 75 MCG tablet TAKE 1 TABLET BY MOUTH EVERY DAY 90 tablet 1  . losartan (COZAAR) 100 MG tablet Take 1 tablet (100 mg total) by mouth daily. 90 tablet 1  . Multiple Vitamins-Minerals (PRESERVISION AREDS 2) CAPS Take 1 capsule by mouth 2 (two) times daily.     . mupirocin ointment (BACTROBAN) 2 % Apply 1 application topically daily. (Patient not taking: Reported on 12/29/2020) 22 g 0   No facility-administered medications prior to visit.    Review of Systems;  Patient denies headache, fevers, malaise, unintentional weight loss, skin rash, eye pain, sinus congestion and sinus pain, sore throat, dysphagia,  hemoptysis , cough, dyspnea, wheezing, chest pain, palpitations, orthopnea, edema, abdominal pain, nausea, melena, diarrhea, constipation, flank pain, dysuria, hematuria, urinary  Frequency, nocturia, numbness, tingling, seizures,  Focal weakness, Loss of consciousness,  Tremor, insomnia, depression, anxiety, and suicidal ideation.      Objective:  BP (!) 160/76 (BP Location: Left Arm, Patient Position: Sitting, Cuff Size: Normal)   Pulse 69   Resp 16   Ht 5'  2" (1.575 m)   Wt 123 lb 6.4 oz (56 kg)   SpO2 99%   BMI 22.57 kg/m   BP Readings from Last 3 Encounters:  12/29/20 (!) 160/76  12/09/20 (!) 174/73  09/27/20 (!) 160/75    Wt Readings from Last 3 Encounters:  12/29/20 123 lb 6.4 oz (56 kg)  09/27/20 119 lb (54 kg)  08/05/20 119 lb (54 kg)    General appearance: alert, cooperative and appears stated age Ears: normal TM's and external ear canals both ears Throat: lips, mucosa, and tongue normal; teeth and gums normal Neck: no adenopathy, no carotid bruit, supple, symmetrical, trachea midline and thyroid not  enlarged, symmetric, no tenderness/mass/nodules Back: symmetric, no curvature. ROM normal. No CVA tenderness. Lungs: clear to auscultation bilaterally Heart: regular rate and rhythm, S1, S2 normal, no murmur, click, rub or gallop Abdomen: soft, non-tender; bowel sounds normal; no masses,  no organomegaly Pulses: 2+ and symmetric Skin: Skin color, texture, turgor normal. No rashes or lesions Lymph nodes: Cervical, supraclavicular, and axillary nodes normal.  No results found for: HGBA1C  Lab Results  Component Value Date   CREATININE 0.86 12/29/2020   CREATININE 0.65 05/05/2020   CREATININE 0.80 05/05/2020    Lab Results  Component Value Date   WBC 6.4 05/05/2020   HGB 13.4 05/05/2020   HCT 40.1 05/05/2020   PLT 190 05/05/2020   GLUCOSE 105 (H) 12/29/2020   CHOL 182 12/29/2020   TRIG 77.0 12/29/2020   HDL 68.00 12/29/2020   LDLDIRECT 76.0 08/31/2016   LDLCALC 98 12/29/2020   ALT 11 12/29/2020   AST 16 12/29/2020   NA 139 12/29/2020   K 4.4 12/29/2020   CL 106 12/29/2020   CREATININE 0.86 12/29/2020   BUN 19 12/29/2020   CO2 28 12/29/2020   TSH 2.20 12/29/2020   MICROALBUR <0.7 10/16/2017    DG Chest 2 View  Result Date: 05/05/2020 CLINICAL DATA:  87 year old female with shortness of breath EXAM: CHEST - 2 VIEW COMPARISON:  Chest radiograph dated 09/27/2018 FINDINGS: There are small bilateral pleural effusions with bibasilar atelectasis or infiltrate. Overall increase in the degree of pleural effusions and associated atelectasis compared to the prior radiograph. There is background of emphysema. No pneumothorax. Stable cardiac silhouette. Atherosclerotic calcification of the aorta. No acute osseous pathology. Osteopenia. Old lower thoracic compression fracture. IMPRESSION: Small bilateral pleural effusions with bibasilar atelectasis or infiltrate. Electronically Signed   By: Anner Crete M.D.   On: 05/05/2020 17:44    Assessment & Plan:   Problem List Items Addressed  This Visit      Unprioritized   Anemia   Hypothyroidism - Primary    TSH is  at goal on 75 mcg daily of levothyroxine . No changes today   Lab Results  Component Value Date   TSH 2.20 12/29/2020         Relevant Orders   TSH (Completed)   Mixed hyperlipidemia    continue asa/plavix.  Statin deferred due to age and current LDL < 100      Relevant Orders   Comprehensive metabolic panel (Completed)   Lipid panel (Completed)   Benign essential hypertension    Previous attempts to lower BP to  563 systolic with amlodipine 10 mg were not tolerated.  Readings are < 140 mostly,  On 5 mg amlodipine 100 mg losartan and clonidine 0.1 mg bid . Advised to add extra 5 mg amlodipine prn  for systolic 875 or higher  Aortic atherosclerosis ([place])    Reviewed findings of prior CT scan today..  Patient is not a good candidate for statin therapy due to age       Peripheral vascular disease ([place])    She has had no progression by serial ABI's and denies claudication,  Defers statin  Due to age and untreated LDL of < 100  Lab Results  Component Value Date   CHOL 182 12/29/2020   HDL 68.00 12/29/2020   LDLCALC 98 12/29/2020   LDLDIRECT 76.0 08/31/2016   TRIG 77.0 12/29/2020   CHOLHDL 3 12/29/2020         Emphysema, unspecified ([place])    Noted on chest x ray done in August to evaluate for shortness of breath during ER visit for hypertensive urgency.  She remains  asymptomatic         I have discontinued Van Clines. Nayak's mupirocin ointment. I am also having her maintain her aspirin EC, PreserVision AREDS 2, dorzolamide, clopidogrel, losartan, levothyroxine, cloNIDine, and amLODipine.  No orders of the defined types were placed in this encounter.   Medications Discontinued During This Encounter  Medication Reason  . mupirocin ointment (BACTROBAN) 2 %     Follow-up: Return in about 6 months (around 07/01/2021).   Crecencio Mc, MD

## 2020-12-29 NOTE — Patient Instructions (Addendum)
You are doing well!   Remember to avoid taking  Motrin and Aleve for back pain   You can take up to 2000 mg of acetominophen (tylenol) every day safely  In divided doses (500 mg every 6 hours  Or 1000 mg every 12 hours.)   If your BP is 150 or higher,  Take an extra amlodipine

## 2020-12-29 NOTE — Assessment & Plan Note (Signed)
continue asa/plavix.  Statin deferred due to age and current LDL < 100

## 2020-12-30 LAB — COMPREHENSIVE METABOLIC PANEL
ALT: 11 U/L (ref 0–35)
AST: 16 U/L (ref 0–37)
Albumin: 4 g/dL (ref 3.5–5.2)
Alkaline Phosphatase: 83 U/L (ref 39–117)
BUN: 19 mg/dL (ref 6–23)
CO2: 28 mEq/L (ref 19–32)
Calcium: 9 mg/dL (ref 8.4–10.5)
Chloride: 106 mEq/L (ref 96–112)
Creatinine, Ser: 0.86 mg/dL (ref 0.40–1.20)
GFR: 58.29 mL/min — ABNORMAL LOW (ref >60.00)
Glucose, Bld: 105 mg/dL — ABNORMAL HIGH (ref 70–99)
Potassium: 4.4 mEq/L (ref 3.5–5.1)
Sodium: 139 mEq/L (ref 135–145)
Total Bilirubin: 0.2 mg/dL (ref 0.2–1.2)
Total Protein: 6 g/dL (ref 6.0–8.3)

## 2020-12-30 LAB — LIPID PANEL
Cholesterol: 182 mg/dL (ref 0–200)
HDL: 68 mg/dL (ref >39.00)
LDL Cholesterol: 98 mg/dL (ref 0–99)
NonHDL: 113.82
Total CHOL/HDL Ratio: 3
Triglycerides: 77 mg/dL (ref 0.0–149.0)
VLDL: 15.4 mg/dL (ref 0.0–40.0)

## 2020-12-30 LAB — TSH: TSH: 2.2 u[IU]/mL (ref 0.35–4.50)

## 2020-12-30 NOTE — Assessment & Plan Note (Signed)
TSH is  at goal on 75 mcg daily of levothyroxine . No changes today   Lab Results  Component Value Date   TSH 2.20 12/29/2020

## 2020-12-30 NOTE — Assessment & Plan Note (Signed)
Noted on chest x ray done in August to evaluate for shortness of breath during ER visit for hypertensive urgency.  She remains  asymptomatic

## 2020-12-30 NOTE — Assessment & Plan Note (Signed)
Previous attempts to lower BP to  459 systolic with amlodipine 10 mg were not tolerated.  Readings are < 140 mostly,  On 5 mg amlodipine 100 mg losartan and clonidine 0.1 mg bid . Advised to add extra 5 mg amlodipine prn  for systolic 136 or higher

## 2020-12-30 NOTE — Assessment & Plan Note (Signed)
She has had no progression by serial ABI's and denies claudication,  Defers statin  Due to age and untreated LDL of < 100  Lab Results  Component Value Date   CHOL 182 12/29/2020   HDL 68.00 12/29/2020   LDLCALC 98 12/29/2020   LDLDIRECT 76.0 08/31/2016   TRIG 77.0 12/29/2020   CHOLHDL 3 12/29/2020

## 2021-01-08 ENCOUNTER — Other Ambulatory Visit: Payer: Self-pay | Admitting: Internal Medicine

## 2021-01-27 ENCOUNTER — Other Ambulatory Visit: Payer: Self-pay

## 2021-01-27 ENCOUNTER — Ambulatory Visit: Payer: Medicare PPO | Admitting: Dermatology

## 2021-01-27 DIAGNOSIS — I809 Phlebitis and thrombophlebitis of unspecified site: Secondary | ICD-10-CM

## 2021-01-27 DIAGNOSIS — Z85828 Personal history of other malignant neoplasm of skin: Secondary | ICD-10-CM | POA: Diagnosis not present

## 2021-01-27 NOTE — Progress Notes (Signed)
   Follow-Up Visit   Subjective  Deanna White is a 87 y.o. female who presents for the following: Other (Spot of left ankle that is very tender x ~1 week).  Accompanied by daughter who contributes to history  The following portions of the chart were reviewed this encounter and updated as appropriate:   Tobacco  Allergies  Meds  Problems  Med Hx  Surg Hx  Fam Hx     Review of Systems:  No other skin or systemic complaints except as noted in HPI or Assessment and Plan.  Objective  Well appearing patient in no apparent distress; mood and affect are within normal limits.  A focused examination was performed including left ankle. Relevant physical exam findings are noted in the Assessment and Plan.  Objective  Left lat heel: Tenderness over a small varicose vein. No evidence of skin cancer.  Objective  Left lat ankle: Clear today. No evidence of recurrence   Assessment & Plan  Superficial phlebitis Left lat heel Increase Aspirin 81 mg 2 po qd x 2 weeks then decrease back to 1 po qd. Continue Plavix as prescribed  If condition persists after 2 weeks, recommend evaluation by Dr. Delana Meyer -vascular surgeon who she has seen in the past. Also advised her to be evaluated by her primary care physician  History of SCC (squamous cell carcinoma) of skin Left lat ankle Clear. Observe for recurrence. Call clinic for new or changing lesions.  Recommend regular skin exams, daily broad-spectrum spf 30+ sunscreen use, and photoprotection.    Return for Follow up as scheduled.  I, Ashok Cordia, CMA, am acting as scribe for Sarina Ser, MD .  Documentation: I have reviewed the above documentation for accuracy and completeness, and I agree with the above.  Sarina Ser, MD

## 2021-01-27 NOTE — Patient Instructions (Signed)

## 2021-02-01 ENCOUNTER — Encounter: Payer: Self-pay | Admitting: Dermatology

## 2021-02-03 ENCOUNTER — Other Ambulatory Visit: Payer: Self-pay | Admitting: Internal Medicine

## 2021-03-05 ENCOUNTER — Other Ambulatory Visit: Payer: Self-pay | Admitting: Internal Medicine

## 2021-03-17 ENCOUNTER — Ambulatory Visit: Payer: Medicare PPO | Admitting: Podiatry

## 2021-03-17 ENCOUNTER — Other Ambulatory Visit: Payer: Self-pay

## 2021-03-17 ENCOUNTER — Encounter: Payer: Self-pay | Admitting: Podiatry

## 2021-03-17 DIAGNOSIS — B351 Tinea unguium: Secondary | ICD-10-CM

## 2021-03-17 DIAGNOSIS — M79674 Pain in right toe(s): Secondary | ICD-10-CM | POA: Diagnosis not present

## 2021-03-17 DIAGNOSIS — M79675 Pain in left toe(s): Secondary | ICD-10-CM

## 2021-03-17 NOTE — Progress Notes (Signed)
This patient returns to my office for at risk foot care.  This patient requires this care by a professional since this patient will be at risk due to having venous insufficiency and PVD.    This patient is unable to cut nails herself since the patient cannot reach her nails.These nails are painful walking and wearing shoes.    Patient says she has corns on her fourth toes both feet.  This patient presents for at risk foot care today.  General Appearance  Alert, conversant and in no acute stress.  Vascular  Dorsalis pedis pulses are  Weakly palpable  B/L.  Posterior tibial pulses are absent  B/L.  bilaterally.  Capillary return is within normal limits  bilaterally. Cold feet bilaterally.Absent digital hair.  Neurologic  Senn-Weinstein monofilament wire test within normal limits  bilaterally. Muscle power within normal limits bilaterally.  Nails Thick disfigured discolored nails with subungual debris  from hallux to fifth toes bilaterally. No evidence of bacterial infection or drainage bilaterally.  Orthopedic  No limitations of motion  feet .  No crepitus or effusions noted.  No bony pathology or digital deformities noted.HT 2-4  B/L.  Adductovarus fifth toes  B/L  Pain noted upon palpation inside border fifth toe right.  No corn noted.  Skin  normotropic skin with no porokeratosis noted bilaterally.  No signs of infections or ulcers noted.     Onychomycosis  Pain in right toes  Pain in left toes  Consent was obtained for treatment procedures.   Mechanical debridement of nails 1-5  bilaterally performed with a nail nipper.  Filed with dremel without incident. Padding dispensed to pad and separate 4/5 toes  B/L.   Return office visit   3 months                  Told patient to return for periodic foot care and evaluation due to potential at risk complications.   Gardiner Barefoot DPM

## 2021-03-21 ENCOUNTER — Telehealth (INDEPENDENT_AMBULATORY_CARE_PROVIDER_SITE_OTHER): Payer: Self-pay | Admitting: Nurse Practitioner

## 2021-03-21 DIAGNOSIS — H903 Sensorineural hearing loss, bilateral: Secondary | ICD-10-CM | POA: Diagnosis not present

## 2021-03-21 NOTE — Telephone Encounter (Signed)
ABIs with GS

## 2021-03-21 NOTE — Telephone Encounter (Signed)
Called stating that she is having pain in left leg. Patient states that her ankle has been giving her issues. She went to get checked out from doctor who did surgery on her ankle stated that issue patient is having is vascular. Patient would like to come in to be seen. Last seen 08/05/21 with abi studies. Please advise.

## 2021-03-22 ENCOUNTER — Ambulatory Visit: Payer: Medicare PPO | Admitting: Cardiovascular Disease

## 2021-03-22 ENCOUNTER — Other Ambulatory Visit: Payer: Self-pay

## 2021-03-22 ENCOUNTER — Encounter: Payer: Self-pay | Admitting: Cardiovascular Disease

## 2021-03-22 VITALS — BP 140/62 | HR 65 | Ht 62.0 in | Wt 121.5 lb

## 2021-03-22 DIAGNOSIS — I739 Peripheral vascular disease, unspecified: Secondary | ICD-10-CM | POA: Diagnosis not present

## 2021-03-22 DIAGNOSIS — I700 Atherosclerosis of aorta: Secondary | ICD-10-CM | POA: Diagnosis not present

## 2021-03-22 DIAGNOSIS — I6529 Occlusion and stenosis of unspecified carotid artery: Secondary | ICD-10-CM

## 2021-03-22 DIAGNOSIS — L97909 Non-pressure chronic ulcer of unspecified part of unspecified lower leg with unspecified severity: Secondary | ICD-10-CM

## 2021-03-22 DIAGNOSIS — I10 Essential (primary) hypertension: Secondary | ICD-10-CM | POA: Diagnosis not present

## 2021-03-22 DIAGNOSIS — I70299 Other atherosclerosis of native arteries of extremities, unspecified extremity: Secondary | ICD-10-CM

## 2021-03-22 DIAGNOSIS — I480 Paroxysmal atrial fibrillation: Secondary | ICD-10-CM

## 2021-03-22 DIAGNOSIS — I340 Nonrheumatic mitral (valve) insufficiency: Secondary | ICD-10-CM | POA: Diagnosis not present

## 2021-03-22 MED ORDER — ROSUVASTATIN CALCIUM 10 MG PO TABS
10.0000 mg | ORAL_TABLET | Freq: Every day | ORAL | 3 refills | Status: DC
Start: 2021-03-22 — End: 2022-01-20

## 2021-03-22 NOTE — Patient Instructions (Addendum)
Medication Instructions:  Please start crestor 10 mg daily For cholesterol   If you need a refill on your cardiac medications before your next appointment, please call your pharmacy.    Lab work: No new labs needed  Testing/Procedures: Will reach out to [facility] about adding carotid ultrasound    Follow-Up: At [facility], you and your health needs are our priority.  As part of our continuing mission to provide you with exceptional heart care, we have created designated Provider Care Teams.  These Care Teams include your primary Cardiologist (physician) and Advanced Practice Providers (APPs -  Physician Assistants and Nurse Practitioners) who all work together to provide you with the care you need, when you need it.  You will need a follow up appointment in 6 months  Providers on your designated Care Team:   Murray Hodgkins, NP Christell Faith, PA-C Marrianne Mood, PA-C [place], [place]  COVID-19 Vaccine Information can be found at: [email or web address].uk For questions related to vaccine distribution or appointments, please email vaccine@[place] .com or call [phone].

## 2021-03-22 NOTE — Progress Notes (Signed)
Date:  03/22/2021   ID:  Deanna White, DOB 02-07-1925, MRN [id number]  Patient Location:  [place]  [id number]   Provider location:   [facility], [place] office  PCP:  Deanna Mc, MD  Cardiologist:  [facility]   Chief Complaint  Patient presents with   12 month follow up     "Doing well." Medications reviewed by the patient verbally.     History of Present Illness:    Deanna White is a 87 y.o. female  past medical history of Svt/atrial fibrillation per Holter monitor read in 2017 by Dr. Louisa White (results not available for review) Holter monitor 2019 showing short runs atrial tachycardia Previous carotid studies demonstrated: <1-39% Hypertension Anemia Hyperlipidemia PAD,  Who presents for follow-up of her PAD, hyperlipidemia, SVT, atrial tachycardia  Last seen by myself in person in the clinic April 2019 Virtual visit June 2020 Seen by Dr. Delana White June 2020  She reports having pain down her left leg, concern for claudication Some GERD symptoms Otherwise active, denies anginal symptoms  Has not had recent carotid ultrasound, last was over 3 years ago  Prior interventional studies reviewed with her -03/2019, Percutaneous transluminal angioplasty of the SFA and popliteal arteries with a 5 mm x 60 mm Lutonix balloon      --Percutaneous transluminal angioplasty left anterior artery with a 2 mm x 30 cm Ultraverse balloon  Prior history nonhealing ulcer Previously was cutting her Plavix in half secondary to bruising  EKG personally reviewed by myself on todays visit Normal sinus rhythm rate 65 bpm no significant ST-T wave changes  Previous records reviewed No clear evidence of atrial fibrillation based on Holter review 2019 Previous Holter monitor has been requested from 2019 (no atrial fibrillation, only short runs of atrial tachycardia noted)  Currently not on anticoagulation   Back in early 2019 , syncopal  episode while at church Taken to the Er Reports work-up was benign Had follow-up with cardiology [place],  Had echocardiogram stress test and Holter Was told the Holter showed atrial fibrillation Reading the notes indicates she had short runs of SVT but it was labeled atrial fibrillation   fatigue on metoprolol   ER 11/2017 For high blood pressure, did not feel well Given hydralazine norvasc increased for discharge  Holter monitor June 2017 short runs of SVT consistent with paroxysmal atrial fibrillation Felt at that time to had CHADS VASC of 2 Not treated with anticoagulation    Past Medical History:  Diagnosis Date   Actinic keratosis    Allergy    Carotid artery occlusion    Colon polyps    Fall from slipping on ice Feb. 2015   Heart murmur    Hypertension    Menopause    Pneumonia    Positive TB test    pt denies   Sore throat    Squamous cell carcinoma of skin 04/17/2019   L post lateral ankle, shave removal 10/15/2019   Thyroid disease    Varicose veins    Past Surgical History:  Procedure Laterality Date   ABDOMINAL HYSTERECTOMY     APPENDECTOMY     BILATERAL SALPINGOOPHORECTOMY  1964   CESAREAN SECTION     EYE SURGERY White March 2016   Cataract   LOWER EXTREMITY ANGIOGRAPHY Left 03/14/2019   Procedure: LOWER EXTREMITY ANGIOGRAPHY;  Surgeon: Katha Cabal, MD;  Location: [facility];  Service: Cardiovascular;  Laterality:  Left;   TONSILLECTOMY     VEIN SURGERY Left    Leg     Current Meds  Medication Sig   amLODipine (NORVASC) 5 MG tablet TAKE 1 TABLET BY MOUTH EVERY DAY   aspirin EC 81 MG tablet Take 81 mg by mouth at bedtime.   cloNIDine (CATAPRES) 0.1 MG tablet TAKE 1 TABLET (0.1 MG TOTAL) BY MOUTH 2 (TWO) TIMES DAILY.   clopidogrel (PLAVIX) 75 MG tablet TAKE 1 TABLET BY MOUTH EVERY DAY   dorzolamide (TRUSOPT) 2 % ophthalmic solution Place 1 drop into both eyes 3 (three) times daily.    levothyroxine (SYNTHROID) 75 MCG tablet TAKE 1  TABLET BY MOUTH EVERY DAY   losartan (COZAAR) 100 MG tablet TAKE 1 TABLET BY MOUTH EVERY DAY   Multiple Vitamins-Minerals (PRESERVISION AREDS 2) CAPS Take 1 capsule by mouth 2 (two) times daily.      Allergies:   Hctz [hydrochlorothiazide], Adhesive [tape], and Brimonidine   Social History   Tobacco Use   Smoking status: Never   Smokeless tobacco: Never  Substance Use Topics   Alcohol use: No   Drug use: No      Family Hx: The patient's family history includes Alcohol abuse in her brother; Cancer in her brother, brother, daughter, and paternal grandmother; Deep vein thrombosis in her mother; Heart attack in her mother and sister; Heart disease in her brother, mother, sister, and sister; Hypertension in her mother and another family member; Kidney disease in her father; Other in an other family member; Varicose Veins in her sister.  ROS:   Please see the history of present illness.    Review of Systems  Constitutional: Negative.   HENT: Negative.    Respiratory: Negative.    Cardiovascular: Negative.   Gastrointestinal: Negative.   Musculoskeletal: Negative.   Neurological: Negative.   Psychiatric/Behavioral: Negative.    All other systems reviewed and are negative.   Labs/Other Tests and Data Reviewed:    Recent Labs: 05/05/2020: Hemoglobin 13.4; Platelets 190 12/29/2020: ALT 11; BUN 19; Creatinine, Ser 0.86; Potassium 4.4; Sodium 139; TSH 2.20   Recent Lipid Panel Lab Results  Component Value Date/Time   CHOL 182 12/29/2020 02:47 PM   TRIG 77.0 12/29/2020 02:47 PM   HDL 68.00 12/29/2020 02:47 PM   CHOLHDL 3 12/29/2020 02:47 PM   LDLCALC 98 12/29/2020 02:47 PM   LDLDIRECT 76.0 08/31/2016 11:45 AM    Wt Readings from Last 3 Encounters:  03/22/21 121 lb 8 oz (55.1 kg)  12/29/20 123 lb 6.4 oz (56 kg)  09/27/20 119 lb (54 kg)     Exam:    Vital Signs: Vital signs may also be detailed in the HPI BP 140/62 (BP Location: Left Arm, Patient Position: Sitting, Cuff  Size: Normal)   Pulse 65   Ht 5\' 2"  (1.575 m)   Wt 121 lb 8 oz (55.1 kg)   SpO2 99%   BMI 22.22 kg/m      ASSESSMENT & PLAN:    Atherosclerosis of artery of extremity with ulceration (HCC) -  PAD LE, prior PTCA, on asa plavix Non-smoker, no diabetes She has follow-up with vascular surgery Cholesterol above goal, recommend she start Crestor 10 mg daily Will send a note to vascular to add on carotid ultrasound with her lower extremity arterial Doppler  Paroxysmal atrial fibrillation (HCC) -  No clear documentation  prior Holter showing short runs atrial tachycardia No new symptoms on today's visit  Peripheral vascular disease ([place]) - Followed by  vascular surgery Prior angioplasty on aspirin Plavix,  Pain in the left leg, has arterial Doppler next week  Essential hypertension, benign - Blood pressure is well controlled on today's visit. No changes made to the medications.  Anemia, unspecified type -  Followed by primary care  Near syncope - No recent episodes, blood pressure stable  Stenosis of carotid artery, unspecified laterality - <39% b/l in 2019 Followed by vascular Had Crestor 10 to achieve goal LDL less than 70    Signed, Ida Rogue, MD  03/22/2021 11:27 AM    [facility] [address], [place], [place] [address]

## 2021-03-22 NOTE — Telephone Encounter (Signed)
Lvm for patient to callback to be scheduled

## 2021-03-22 NOTE — Telephone Encounter (Signed)
PATIENT CALLED BACK TO BE SCHEDULED

## 2021-03-28 DIAGNOSIS — H903 Sensorineural hearing loss, bilateral: Secondary | ICD-10-CM | POA: Diagnosis not present

## 2021-03-29 ENCOUNTER — Other Ambulatory Visit: Payer: Self-pay | Admitting: Internal Medicine

## 2021-03-29 DIAGNOSIS — I739 Peripheral vascular disease, unspecified: Secondary | ICD-10-CM

## 2021-03-30 ENCOUNTER — Other Ambulatory Visit (INDEPENDENT_AMBULATORY_CARE_PROVIDER_SITE_OTHER): Payer: Self-pay | Admitting: Vascular Surgery

## 2021-03-30 DIAGNOSIS — I739 Peripheral vascular disease, unspecified: Secondary | ICD-10-CM

## 2021-03-30 DIAGNOSIS — I679 Cerebrovascular disease, unspecified: Secondary | ICD-10-CM

## 2021-04-01 ENCOUNTER — Other Ambulatory Visit: Payer: Self-pay

## 2021-04-01 ENCOUNTER — Ambulatory Visit (INDEPENDENT_AMBULATORY_CARE_PROVIDER_SITE_OTHER): Payer: Medicare PPO | Admitting: Nurse Practitioner

## 2021-04-01 ENCOUNTER — Ambulatory Visit (INDEPENDENT_AMBULATORY_CARE_PROVIDER_SITE_OTHER): Payer: Medicare PPO

## 2021-04-01 ENCOUNTER — Other Ambulatory Visit (INDEPENDENT_AMBULATORY_CARE_PROVIDER_SITE_OTHER): Payer: Self-pay | Admitting: Vascular Surgery

## 2021-04-01 VITALS — BP 176/68 | HR 70 | Ht 60.0 in | Wt 121.0 lb

## 2021-04-01 DIAGNOSIS — I10 Essential (primary) hypertension: Secondary | ICD-10-CM

## 2021-04-01 DIAGNOSIS — I679 Cerebrovascular disease, unspecified: Secondary | ICD-10-CM

## 2021-04-01 DIAGNOSIS — I739 Peripheral vascular disease, unspecified: Secondary | ICD-10-CM | POA: Diagnosis not present

## 2021-04-01 DIAGNOSIS — E782 Mixed hyperlipidemia: Secondary | ICD-10-CM

## 2021-04-16 ENCOUNTER — Encounter (INDEPENDENT_AMBULATORY_CARE_PROVIDER_SITE_OTHER): Payer: Self-pay | Admitting: Nurse Practitioner

## 2021-04-16 NOTE — Progress Notes (Signed)
Subjective:    [name], female    DOB: 05/11/1925, 87 y.o.   MRN: [id number] Chief Complaint  Patient presents with   Follow-up    Add on per phone note ABI    Deanna White is a 87 year old female that presents today for pain in her left ankle area.  The patient had an ulceration in her left ankle area however that has healed.  She notes some pain that is burning and stinging that wakes her up at night occasionally.  He does not do this every day.  She denies any discoloration of her toes.  She denies anything consistent with rest pain or claudication.  She denies any other wounds or ulcerations.  Today noninvasive studies show an ABI of 0.96 on the right and 1.16 on the left.  Previous ABI was 0.71 on the right and 0.98 on the left.  The patient has biphasic tibial artery waveforms on the right with biphasic/monophasic waveforms on the left.  The right toe waveforms are normal with slightly dampened toe waveforms on the left   Review of Systems  Skin:  Negative for wound.  All other systems reviewed and are negative.     Objective:   Physical Exam Vitals reviewed.  HENT:     Head: Normocephalic.  Cardiovascular:     Rate and Rhythm: Normal rate.     Pulses:          Dorsalis pedis pulses are 1+ on the right side and 1+ on the left side.       Posterior tibial pulses are 1+ on the right side and 1+ on the left side.  Pulmonary:     Effort: Pulmonary effort is normal.  Skin:    General: Skin is warm and dry.  Neurological:     Mental Status: She is alert and oriented to person, place, and time. Mental status is at baseline.  Psychiatric:        Mood and Affect: Mood normal.        Behavior: Behavior normal.        Thought Content: Thought content normal.        Judgment: Judgment normal.    BP (!) 176/68   Pulse 70   Ht 5' (1.524 m)   Wt 121 lb (54.9 kg)   BMI 23.63 kg/m   Past Medical History:  Diagnosis Date   Actinic keratosis    Allergy     Carotid artery occlusion    Colon polyps    Fall from slipping on ice Feb. 2015   Heart murmur    Hypertension    Menopause    Pneumonia    Positive TB test    pt denies   Sore throat    Squamous cell carcinoma of skin 04/17/2019   L post lateral ankle, shave removal 10/15/2019   Thyroid disease    Varicose veins     Social History   Socioeconomic History   Marital status: Widowed    Spouse name: Not on file   Number of children: Not on file   Years of education: Not on file   Highest education level: Not on file  Occupational History   Not on file  Tobacco Use   Smoking status: Never   Smokeless tobacco: Never  Substance and Sexual Activity   Alcohol use: No   Drug use: No   Sexual activity: Never  Other Topics Concern   Not on file  Social  History Narrative   Not on file   Social Determinants of Health   Financial Resource Strain: Low Risk    Difficulty of Paying Living Expenses: Not hard at all  Food Insecurity: No Food Insecurity   Worried About Charity fundraiser in the Last Year: Never true   [place] in the Last Year: Never true  Transportation Needs: No Transportation Needs   Lack of Transportation (Medical): No   Lack of Transportation (Non-Medical): No  Physical Activity: Not on file  Stress: No Stress Concern Present   Feeling of Stress : Not at all  Social Connections: Unknown   Frequency of Communication with Friends and Family: More than three times a week   Frequency of Social Gatherings with Friends and Family: More than three times a week   Attends Religious Services: Not on file   Active Member of Clubs or Organizations: Not on file   Attends Archivist Meetings: Not on file   Marital Status: Not on file  Intimate Partner Violence: Not At Risk   Fear of Current or Ex-Partner: No   Emotionally Abused: No   Physically Abused: No   Sexually Abused: No    Past Surgical History:  Procedure Laterality Date   ABDOMINAL  HYSTERECTOMY     APPENDECTOMY     BILATERAL [place] Right March 2016   Cataract   LOWER EXTREMITY ANGIOGRAPHY Left 03/14/2019   Procedure: LOWER EXTREMITY ANGIOGRAPHY;  Surgeon: Katha Cabal, MD;  Location: [facility];  Service: Cardiovascular;  Laterality: Left;   TONSILLECTOMY     VEIN SURGERY Left    Leg    Family History  Problem Relation Age of Onset   Heart disease Mother        After age 85   Hypertension Mother    Deep vein thrombosis Mother    Heart attack Mother    Kidney disease Father    Alcohol abuse Brother    Heart disease Brother    Cancer Brother        Lung cancer   Cancer Brother        kidney cancer   Varicose Veins Sister    Heart disease Sister        After age 4   Heart attack Sister    Heart disease Sister        After age 26   Other Other        epilepsy   Hypertension Other    Cancer Paternal Grandmother        breast cancer   Cancer Daughter     Allergies  Allergen Reactions   Hctz [Hydrochlorothiazide] Other (See Comments)    Hyponatremia    Adhesive [Tape] Rash    Peels skin   Brimonidine Rash and Other (See Comments)    Pt felt as if throat was closing up, pt also had rash    CBC Latest Ref Rng & Units 05/05/2020 04/19/2020 09/17/2018  WBC 4.0 - 10.5 K/uL 6.4 6.9 6.9  Hemoglobin 12.0 - 15.0 g/dL 13.4 12.0 11.2(L)  Hematocrit 36.0 - 46.0 % 40.1 36.1 33.2(L)  Platelets 150 - 400 K/uL 190 202.0 235.0      CMP     Component Value Date/Time   NA 139 12/29/2020 1447   NA 137 12/04/2018 0000   K 4.4 12/29/2020 1447   CL 106 12/29/2020 1447  CO2 28 12/29/2020 1447   GLUCOSE 105 (H) 12/29/2020 1447   BUN 19 12/29/2020 1447   BUN 16 12/04/2018 0000   CREATININE 0.86 12/29/2020 1447   CALCIUM 9.0 12/29/2020 1447   PROT 6.0 12/29/2020 1447   ALBUMIN 4.0 12/29/2020 1447   AST 16 12/29/2020 1447   ALT 11 12/29/2020 1447   ALKPHOS 83 12/29/2020 1447    BILITOT 0.2 12/29/2020 1447   GFRNONAA >60 05/05/2020 1449   GFRAA >60 05/05/2020 1449     VAS [place] ABI WITH/WO TBI  Result Date: 04/08/2021  LOWER EXTREMITY DOPPLER STUDY Patient Name:  Deanna White  Date of Exam:   04/01/2021 Medical Rec #: [id number]      Accession #:    [id number] Date of Birth: Aug 11, 1925     Patient Gender: F Patient Age:   71Y Exam Location:  [facility] Procedure:      VAS [place] ABI WITH/WO TBI Referring Phys: [id number] [place] --------------------------------------------------------------------------------  Indications: Ulceration, peripheral artery disease, and Left lateral calf and              ankle non-healing wounds since December.  Comparison Study: 08/05/20 Performing Technologist: Concha Norway RVT  Examination Guidelines: A complete evaluation includes at minimum, Doppler waveform signals and systolic blood pressure reading at the level of bilateral brachial, anterior tibial, and posterior tibial arteries, when vessel segments are accessible. Bilateral testing is considered an integral part of a complete examination. Photoelectric Plethysmograph (PPG) waveforms and toe systolic pressure readings are included as required and additional duplex testing as needed. Limited examinations for reoccurring indications may be performed as noted.  ABI Findings: +---------+------------------+-----+--------+--------+ Right    Rt Pressure (mmHg)IndexWaveformComment  +---------+------------------+-----+--------+--------+ Brachial 156                                     +---------+------------------+-----+--------+--------+ ATA      150               0.96 biphasic         +---------+------------------+-----+--------+--------+ PTA      150               0.96 biphasic         +---------+------------------+-----+--------+--------+ Great Toe82                0.53 Normal           +---------+------------------+-----+--------+--------+  +---------+------------------+-----+----------+-------+ Left     Lt Pressure (mmHg)IndexWaveform  Comment +---------+------------------+-----+----------+-------+ ATA      181               1.16 biphasic          +---------+------------------+-----+----------+-------+ PTA      180               1.15 monophasic        +---------+------------------+-----+----------+-------+ Great Toe39                0.25 Abnormal          +---------+------------------+-----+----------+-------+ +-------+-----------+-----------+------------+------------+ ABI/TBIToday's ABIToday's TBIPrevious ABIPrevious TBI +-------+-----------+-----------+------------+------------+ Right  .96        .53        .71         .44          +-------+-----------+-----------+------------+------------+ Left   1.16       .25        .98         .  44          +-------+-----------+-----------+------------+------------+ Right ABIs appear increased compared to prior study on 01/2020.  Summary: Right: Resting right ankle-brachial index is within normal range. No evidence of significant right lower extremity arterial disease. The right toe-brachial index is abnormal. Left: Resting left ankle-brachial index is within normal range. No evidence of significant left lower extremity arterial disease. The left toe-brachial index is abnormal. Mildly dampened toe waveforms in the 4th and 5th toes.  *See table(s) above for measurements and observations.  Electronically signed by Leotis Pain MD on 04/08/2021 at 8:38:54 AM.    Final        Assessment & Plan:   1. PVD (peripheral vascular disease) ([place]) The patient's ankle wound has healed.  The patient's noninvasive studies were not drastically decreased and there is adequate perfusion.  The patient's description of pain is more like rest pain and this typically accompanies severely decreased ABIs.  I suspect this is more nerve related pain following healing of her ulceration.  The  patient will follow-up in 6 months with noninvasive studies unless other issues arise. 2. Benign essential hypertension Continue antihypertensive medications as already ordered, these medications have been reviewed and there are no changes at this time.   3. Mixed hyperlipidemia Continue statin as ordered and reviewed, no changes at this time    Current Outpatient Medications on File Prior to Visit  Medication Sig Dispense Refill   amLODipine (NORVASC) 5 MG tablet TAKE 1 TABLET BY MOUTH EVERY DAY 90 tablet 1   aspirin EC 81 MG tablet Take 81 mg by mouth at bedtime.     cloNIDine (CATAPRES) 0.1 MG tablet TAKE 1 TABLET (0.1 MG TOTAL) BY MOUTH 2 (TWO) TIMES DAILY. 180 tablet 2   dorzolamide (TRUSOPT) 2 % ophthalmic solution Place 1 drop into both eyes 3 (three) times daily.   12   levothyroxine (SYNTHROID) 75 MCG tablet TAKE 1 TABLET BY MOUTH EVERY DAY 90 tablet 1   losartan (COZAAR) 100 MG tablet TAKE 1 TABLET BY MOUTH EVERY DAY 90 tablet 1   Multiple Vitamins-Minerals (PRESERVISION AREDS 2) CAPS Take 1 capsule by mouth 2 (two) times daily.      rosuvastatin (CRESTOR) 10 MG tablet Take 1 tablet (10 mg total) by mouth daily. 90 tablet 3   No current facility-administered medications on file prior to visit.    There are no Patient Instructions on file for this visit. No follow-ups on file.   Kris Hartmann, NP

## 2021-04-19 ENCOUNTER — Other Ambulatory Visit: Payer: Self-pay | Admitting: Cardiovascular Disease

## 2021-06-01 ENCOUNTER — Other Ambulatory Visit: Payer: Self-pay

## 2021-06-01 ENCOUNTER — Ambulatory Visit: Payer: Medicare PPO | Admitting: Dermatology

## 2021-06-01 DIAGNOSIS — L578 Other skin changes due to chronic exposure to nonionizing radiation: Secondary | ICD-10-CM | POA: Diagnosis not present

## 2021-06-01 DIAGNOSIS — Z85828 Personal history of other malignant neoplasm of skin: Secondary | ICD-10-CM | POA: Diagnosis not present

## 2021-06-01 DIAGNOSIS — D1800 Hemangioma unspecified site: Secondary | ICD-10-CM

## 2021-06-01 DIAGNOSIS — C44310 Basal cell carcinoma of skin of unspecified parts of face: Secondary | ICD-10-CM

## 2021-06-01 DIAGNOSIS — D692 Other nonthrombocytopenic purpura: Secondary | ICD-10-CM | POA: Diagnosis not present

## 2021-06-01 DIAGNOSIS — L821 Other seborrheic keratosis: Secondary | ICD-10-CM

## 2021-06-01 DIAGNOSIS — C4491 Basal cell carcinoma of skin, unspecified: Secondary | ICD-10-CM

## 2021-06-01 DIAGNOSIS — C44319 Basal cell carcinoma of skin of other parts of face: Secondary | ICD-10-CM | POA: Diagnosis not present

## 2021-06-01 DIAGNOSIS — Z1283 Encounter for screening for malignant neoplasm of skin: Secondary | ICD-10-CM | POA: Diagnosis not present

## 2021-06-01 DIAGNOSIS — L814 Other melanin hyperpigmentation: Secondary | ICD-10-CM

## 2021-06-01 DIAGNOSIS — D229 Melanocytic nevi, unspecified: Secondary | ICD-10-CM

## 2021-06-01 DIAGNOSIS — B353 Tinea pedis: Secondary | ICD-10-CM | POA: Diagnosis not present

## 2021-06-01 DIAGNOSIS — D485 Neoplasm of uncertain behavior of skin: Secondary | ICD-10-CM

## 2021-06-01 HISTORY — DX: Basal cell carcinoma of skin, unspecified: C44.91

## 2021-06-01 MED ORDER — KETOCONAZOLE 2 % EX CREA
1.0000 | TOPICAL_CREAM | Freq: Every day | CUTANEOUS | 11 refills | Status: DC
Start: 2021-06-01 — End: 2022-06-01

## 2021-06-01 NOTE — Progress Notes (Signed)
Follow-Up Visit   Subjective  Deanna White is a 87 y.o. female who presents for the following: Annual Exam (History of SCC - TBSE today).  Accompanied by daughter who contributes to history  The patient presents for Total-Body Skin Exam (TBSE) for skin cancer screening and mole check.  The following portions of the chart were reviewed this encounter and updated as appropriate:   Tobacco  Allergies  Meds  Problems  Med Hx  Surg Hx  Fam Hx     Review of Systems:  No other skin or systemic complaints except as noted in HPI or Assessment and Plan.  Objective  Well appearing patient in no apparent distress; mood and affect are within normal limits.  A full examination was performed including scalp, head, eyes, ears, nose, lips, neck, chest, axillae, abdomen, back, buttocks, bilateral upper extremities, bilateral lower extremities, hands, feet, fingers, toes, fingernails, and toenails. All findings within normal limits unless otherwise noted below.  Right Temple 1.1 cm pearly papule  Bilateral feet and nails Scale and nail dystrophy   Assessment & Plan   Purpura - Chronic; persistent and recurrent.  Treatable, but not curable. - Violaceous macules and patches - Benign - Related to trauma, age, sun damage and/or use of blood thinners, chronic use of topical and/or oral steroids - Observe - Can use OTC arnica containing moisturizer such as Dermend Bruise Formula if desired - Call for worsening or other concerns  Lentigines - Scattered tan macules - Due to sun exposure - Benign-appering, observe - Recommend daily broad spectrum sunscreen SPF 30+ to sun-exposed areas, reapply every 2 hours as needed. - Call for any changes  Seborrheic Keratoses - Stuck-on, waxy, tan-brown papules and/or plaques  - Benign-appearing - Discussed benign etiology and prognosis. - Observe - Call for any changes  Melanocytic Nevi - Tan-brown and/or pink-flesh-colored symmetric macules  and papules - Benign appearing on exam today - Observation - Call clinic for new or changing moles - Recommend daily use of broad spectrum spf 30+ sunscreen to sun-exposed areas.   Hemangiomas - Red papules - Discussed benign nature - Observe - Call for any changes  Actinic Damage - Chronic condition, secondary to cumulative UV/sun exposure - diffuse scaly erythematous macules with underlying dyspigmentation - Recommend daily broad spectrum sunscreen SPF 30+ to sun-exposed areas, reapply every 2 hours as needed.  - Staying in the shade or wearing long sleeves, sun glasses (UVA+UVB protection) and wide brim hats (4-inch brim around the entire circumference of the hat) are also recommended for sun protection.  - Call for new or changing lesions.  Skin cancer screening performed today.  Neoplasm of uncertain behavior of skin Right Temple  Epidermal / dermal shaving  Lesion diameter (cm):  1.1 Informed consent: discussed and consent obtained   Timeout: patient name, date of birth, surgical site, and procedure verified   Procedure prep:  Patient was prepped and draped in usual sterile fashion Prep type:  Isopropyl alcohol Anesthesia: the lesion was anesthetized in a standard fashion   Anesthetic:  1% lidocaine w/ epinephrine 1-100,000 buffered w/ 8.4% NaHCO3 Instrument used: flexible razor blade   Hemostasis achieved with: pressure, aluminum chloride and electrodesiccation   Outcome: patient tolerated procedure well   Post-procedure details: sterile dressing applied and wound care instructions given   Dressing type: bandage and petrolatum    Destruction of lesion Complexity: extensive   Destruction method: electrodesiccation and curettage   Informed consent: discussed and consent obtained   Timeout:  patient name, date of birth, surgical site, and procedure verified Procedure prep:  Patient was prepped and draped in usual sterile fashion Prep type:  Isopropyl  alcohol Anesthesia: the lesion was anesthetized in a standard fashion   Anesthetic:  1% lidocaine w/ epinephrine 1-100,000 buffered w/ 8.4% NaHCO3 Curettage performed in three different directions: Yes   Electrodesiccation performed over the curetted area: Yes   Lesion length (cm):  1.1 Lesion width (cm):  1.1 Margin per side (cm):  0.2 Final wound size (cm):  1.5 Hemostasis achieved with:  pressure and aluminum chloride Outcome: patient tolerated procedure well with no complications   Post-procedure details: sterile dressing applied and wound care instructions given   Dressing type: bandage and petrolatum    Specimen 1 - Surgical pathology Differential Diagnosis: BCC vs other  Check Margins: No EDC today  Tinea pedis of both feet Bilateral feet and nails Chronic and persistent Start Ketoconazole 2% cream qhs  ketoconazole (NIZORAL) 2 % cream - Bilateral feet and nails Apply 1 application topically at bedtime.  Skin cancer screening  Return in about 1 year (around 06/01/2022) for TBSE.  I, Ashok Cordia, CMA, am acting as scribe for Sarina Ser, MD . Documentation: I have reviewed the above documentation for accuracy and completeness, and I agree with the above.  Sarina Ser, MD

## 2021-06-01 NOTE — Patient Instructions (Signed)
Wound Care Instructions  Cleanse wound gently with soap and water once a day then pat dry with clean gauze. Apply a thing coat of Petrolatum (petroleum jelly, "Vaseline") over the wound (unless you have an allergy to this). We recommend that you use a new, sterile tube of Vaseline. Do not pick or remove scabs. Do not remove the yellow or white "healing tissue" from the base of the wound.  Cover the wound with fresh, clean, nonstick gauze and secure with paper tape. You may use Band-Aids in place of gauze and tape if the would is small enough, but would recommend trimming much of the tape off as there is often too much. Sometimes Band-Aids can irritate the skin.  You should call the office for your biopsy report after 1 week if you have not already been contacted.  If you experience any problems, such as abnormal amounts of bleeding, swelling, significant bruising, significant pain, or evidence of infection, please call the office immediately.  FOR ADULT SURGERY PATIENTS: If you need something for pain relief you may take 1 extra strength Tylenol (acetaminophen) AND 2 Ibuprofen (200mg each) together every 4 hours as needed for pain. (do not take these if you are allergic to them or if you have a reason you should not take them.) Typically, you may only need pain medication for 1 to 3 days.   If you have any questions or concerns for your doctor, please call our main line at [phone] and press option 4 to reach your doctor's medical assistant. If no one answers, please leave a voicemail as directed and we will return your call as soon as possible. Messages left after 4 pm will be answered the following business day.   You may also send us a message via MyChart. We typically respond to MyChart messages within 1-2 business days.  For prescription refills, please ask your pharmacy to contact our office. Our fax number is [phone].  If you have an urgent issue when the clinic is closed that  cannot wait until the next business day, you can page your doctor at the number below.    Please note that while we do our best to be available for urgent issues outside of office hours, we are not available 24/7.   If you have an urgent issue and are unable to reach us, you may choose to seek medical care at your doctor's office, retail clinic, urgent care center, or emergency room.  If you have a medical emergency, please immediately call 911 or go to the emergency department.  Pager Numbers  - Dr. Kowalski: [phone]  - Dr. Moye: [phone]  - Dr. Stewart: [phone]  In the event of inclement weather, please call our main line at [phone] for an update on the status of any delays or closures.  Dermatology Medication Tips: Please keep the boxes that topical medications come in in order to help keep track of the instructions about where and how to use these. Pharmacies typically print the medication instructions only on the boxes and not directly on the medication tubes.   If your medication is too expensive, please contact our office at [phone] option 4 or send us a message through MyChart.   We are unable to tell what your co-pay for medications will be in advance as this is different depending on your insurance coverage. However, we may be able to find a substitute medication at lower cost or fill out paperwork to get insurance to cover a needed   medication.   If a prior authorization is required to get your medication covered by your insurance company, please allow us 1-2 business days to complete this process.  Drug prices often vary depending on where the prescription is filled and some pharmacies may offer cheaper prices.  The website www.goodrx.com contains coupons for medications through different pharmacies. The prices here do not account for what the cost may be with help from insurance (it may be cheaper with your insurance), but the website can give you the  price if you did not use any insurance.  - You can print the associated coupon and take it with your prescription to the pharmacy.  - You may also stop by our office during regular business hours and pick up a GoodRx coupon card.  - If you need your prescription sent electronically to a different pharmacy, notify our office through [place] MyChart or by phone at [phone] option 4.   

## 2021-06-02 ENCOUNTER — Encounter: Payer: Self-pay | Admitting: Dermatology

## 2021-06-03 ENCOUNTER — Telehealth: Payer: Self-pay

## 2021-06-03 NOTE — Telephone Encounter (Signed)
-----   Message from Ralene Bathe, MD sent at 06/02/2021  7:24 PM EDT ----- Diagnosis Skin , right temple BASAL CELL CARCINOMA, SUPERFICIAL, NODULAR AND INFILTRATIVE PATTERNS, BASE INVOLVED  Cancer - BCC Already treated Recheck next visit

## 2021-06-03 NOTE — Telephone Encounter (Signed)
Left message on voicemail to return my call.  

## 2021-06-15 ENCOUNTER — Telehealth: Payer: Self-pay

## 2021-06-15 NOTE — Telephone Encounter (Signed)
Patient advised of BX results.  °

## 2021-06-15 NOTE — Telephone Encounter (Signed)
-----   Message from Ralene Bathe, MD sent at 06/02/2021  7:24 PM EDT ----- Diagnosis Skin , right temple BASAL CELL CARCINOMA, SUPERFICIAL, NODULAR AND INFILTRATIVE PATTERNS, BASE INVOLVED  Cancer - BCC Already treated Recheck next visit

## 2021-06-23 ENCOUNTER — Encounter: Payer: Self-pay | Admitting: Podiatry

## 2021-06-23 ENCOUNTER — Ambulatory Visit: Payer: Medicare PPO | Admitting: Podiatry

## 2021-06-23 ENCOUNTER — Other Ambulatory Visit: Payer: Self-pay

## 2021-06-23 DIAGNOSIS — M79674 Pain in right toe(s): Secondary | ICD-10-CM

## 2021-06-23 DIAGNOSIS — M79675 Pain in left toe(s): Secondary | ICD-10-CM | POA: Diagnosis not present

## 2021-06-23 DIAGNOSIS — B351 Tinea unguium: Secondary | ICD-10-CM | POA: Diagnosis not present

## 2021-06-23 NOTE — Progress Notes (Signed)
This patient returns to my office for at risk foot care.  This patient requires this care by a professional since this patient will be at risk due to having venous insufficiency and PVD.    This patient is unable to cut nails herself since the patient cannot reach her nails.These nails are painful walking and wearing shoes.    Patient says she has corns on her fourth toes both feet.  This patient presents for at risk foot care today.  General Appearance  Alert, conversant and in no acute stress.  Vascular  Dorsalis pedis pulses are  Weakly palpable  B/L.  Posterior tibial pulses are absent  B/L.  bilaterally.  Capillary return is within normal limits  bilaterally. Cold feet bilaterally.Absent digital hair.  Neurologic  Senn-Weinstein monofilament wire test within normal limits  bilaterally. Muscle power within normal limits bilaterally.  Nails Thick disfigured discolored nails with subungual debris  from hallux to fifth toes bilaterally. No evidence of bacterial infection or drainage bilaterally.  Orthopedic  No limitations of motion  feet .  No crepitus or effusions noted.  No bony pathology or digital deformities noted.HT 2-4  B/L.  Adductovarus fifth toes  B/L  Pain noted upon palpation inside border fifth toe right.  No corn noted.  Skin  normotropic skin with no porokeratosis noted bilaterally.  No signs of infections or ulcers noted.     Onychomycosis  Pain in right toes  Pain in left toes  Consent was obtained for treatment procedures.   Mechanical debridement of nails 1-5  bilaterally performed with a nail nipper.  Filed with dremel without incident. Padding dispensed to pad and separate 4/5 toes  B/L.   Return office visit   3 months                  Told patient to return for periodic foot care and evaluation due to potential at risk complications.   Gardiner Barefoot DPM

## 2021-07-01 ENCOUNTER — Ambulatory Visit: Payer: Medicare PPO | Admitting: Internal Medicine

## 2021-07-01 ENCOUNTER — Other Ambulatory Visit: Payer: Self-pay

## 2021-07-01 ENCOUNTER — Encounter: Payer: Self-pay | Admitting: Internal Medicine

## 2021-07-01 VITALS — BP 176/72 | HR 84 | Temp 95.4°F | Ht 60.0 in | Wt 120.4 lb

## 2021-07-01 DIAGNOSIS — E782 Mixed hyperlipidemia: Secondary | ICD-10-CM | POA: Diagnosis not present

## 2021-07-01 DIAGNOSIS — M791 Myalgia, unspecified site: Secondary | ICD-10-CM | POA: Diagnosis not present

## 2021-07-01 DIAGNOSIS — J439 Emphysema, unspecified: Secondary | ICD-10-CM | POA: Diagnosis not present

## 2021-07-01 DIAGNOSIS — E034 Atrophy of thyroid (acquired): Secondary | ICD-10-CM

## 2021-07-01 DIAGNOSIS — I700 Atherosclerosis of aorta: Secondary | ICD-10-CM | POA: Diagnosis not present

## 2021-07-01 DIAGNOSIS — I10 Essential (primary) hypertension: Secondary | ICD-10-CM

## 2021-07-01 DIAGNOSIS — T466X5A Adverse effect of antihyperlipidemic and antiarteriosclerotic drugs, initial encounter: Secondary | ICD-10-CM | POA: Diagnosis not present

## 2021-07-01 LAB — COMPREHENSIVE METABOLIC PANEL
ALT: 10 U/L (ref 0–35)
AST: 16 U/L (ref 0–37)
Albumin: 4 g/dL (ref 3.5–5.2)
Alkaline Phosphatase: 80 U/L (ref 39–117)
BUN: 12 mg/dL (ref 6–23)
CO2: 26 mEq/L (ref 19–32)
Calcium: 8.9 mg/dL (ref 8.4–10.5)
Chloride: 102 mEq/L (ref 96–112)
Creatinine, Ser: 0.87 mg/dL (ref 0.40–1.20)
GFR: 57.28 mL/min — ABNORMAL LOW (ref >60.00)
Glucose, Bld: 75 mg/dL (ref 70–99)
Potassium: 4.2 mEq/L (ref 3.5–5.1)
Sodium: 137 mEq/L (ref 135–145)
Total Bilirubin: 0.5 mg/dL (ref 0.2–1.2)
Total Protein: 5.9 g/dL — ABNORMAL LOW (ref 6.0–8.3)

## 2021-07-01 LAB — TSH: TSH: 4.05 u[IU]/mL (ref 0.35–5.50)

## 2021-07-01 NOTE — Progress Notes (Signed)
Subjective:  [name], female    DOB: Sep 19, 1925  Age: 87 y.o. MRN: [id number]  CC: The primary encounter diagnosis was Hypothyroidism due to acquired atrophy of thyroid. Diagnoses of Aortic atherosclerosis ([place]), Benign essential hypertension, Pulmonary emphysema, unspecified emphysema type ([place]), Myalgia due to statin, and Mixed hyperlipidemia were also pertinent to this visit.  HPI Deanna White presents for follow up  Chief Complaint  Patient presents with   Follow-up    6 month follow up on hypertension, hypothyroidism   This visit occurred during the SARS-CoV-2 public health emergency.  Safety protocols were in place, including screening questions prior to the visit, additional usage of staff PPE, and extensive cleaning of exam room while observing appropriate contact time as indicated for disinfecting solutions.   1) HTN:  tolerating her medications.  BP readings at home are lower than in office and fluctuate frequently . She notes that she has lower readings  when she restricts  the salt in her diet   2) early waking due to OAB. Occasionally has 1-5  hours of latency after that.  Not falling asleep during the day   3)  appetite "too good."  Has meals supplemented by caregiver. Vegetarian.  Gets protein from nuts,  grains.  Dried beans  4) Bowels move most days easily.  Occasional incontinence  5) No balance issues or falls.  6) statin myalgia with recent trial of cholesterol started by Dr Rockey Situ .  Reviewed reason for statin: she has aortic atheroscleris with normal lipid profile.  She is taking aspirin.  Bruising a lot on  asa but not bleeding   7) skin cancer removed right temple by Nehemiah Massed recently.  Healing well.   Outpatient Medications Prior to Visit  Medication Sig Dispense Refill   amLODipine (NORVASC) 5 MG tablet TAKE 1 TABLET BY MOUTH EVERY DAY 90 tablet 1   aspirin EC 81 MG tablet Take 81 mg by mouth at bedtime.     cloNIDine (CATAPRES) 0.1 MG  tablet TAKE 1 TABLET BY MOUTH 2 TIMES DAILY. 180 tablet 1   clopidogrel (PLAVIX) 75 MG tablet TAKE 1 TABLET BY MOUTH EVERY DAY 90 tablet 1   dorzolamide (TRUSOPT) 2 % ophthalmic solution Place 1 drop into both eyes 3 (three) times daily.   12   ketoconazole (NIZORAL) 2 % cream Apply 1 application topically at bedtime. 60 g 11   levothyroxine (SYNTHROID) 75 MCG tablet TAKE 1 TABLET BY MOUTH EVERY DAY 90 tablet 1   losartan (COZAAR) 100 MG tablet TAKE 1 TABLET BY MOUTH EVERY DAY 90 tablet 1   Multiple Vitamins-Minerals (PRESERVISION AREDS 2) CAPS Take 1 capsule by mouth 2 (two) times daily.      rosuvastatin (CRESTOR) 10 MG tablet Take 1 tablet (10 mg total) by mouth daily. (Patient not taking: Reported on 07/01/2021) 90 tablet 3   No facility-administered medications prior to visit.    Review of Systems;  Patient denies headache, fevers, malaise, unintentional weight loss, skin rash, eye pain, sinus congestion and sinus pain, sore throat, dysphagia,  hemoptysis , cough, dyspnea, wheezing, chest pain, palpitations, orthopnea, edema, abdominal pain, nausea, melena, diarrhea, constipation, flank pain, dysuria, hematuria, urinary  Frequency, nocturia, numbness, tingling, seizures,  Focal weakness, Loss of consciousness,  Tremor, insomnia, depression, anxiety, and suicidal ideation.      Objective:  BP (!) 176/72 (BP Location: Left Arm, Patient Position: Sitting, Cuff Size: Normal)   Pulse 84   Temp (!) 95.4 F (35.2  C) (Temporal)   Ht 5' (1.524 m)   Wt 120 lb 6.4 oz (54.6 kg)   SpO2 99%   BMI 23.51 kg/m   BP Readings from Last 3 Encounters:  07/01/21 (!) 176/72  04/01/21 (!) 176/68  03/22/21 140/62    Wt Readings from Last 3 Encounters:  07/01/21 120 lb 6.4 oz (54.6 kg)  04/01/21 121 lb (54.9 kg)  03/22/21 121 lb 8 oz (55.1 kg)    General appearance: alert, cooperative and appears stated age Ears: normal TM's and external ear canals both ears Throat: lips, mucosa, and tongue  normal; teeth and gums normal Neck: no adenopathy, no carotid bruit, supple, symmetrical, trachea midline and thyroid not enlarged, symmetric, no tenderness/mass/nodules Back: symmetric, no curvature. ROM normal. No CVA tenderness. Lungs: clear to auscultation bilaterally Heart: regular rate and rhythm, S1, S2 normal, no murmur, click, rub or gallop Abdomen: soft, non-tender; bowel sounds normal; no masses,  no organomegaly Pulses: 2+ and symmetric Skin: right temple biopsy site healing well, Skin color, texture, turgor normal. No rashes or lesions Lymph nodes: Cervical, supraclavicular, and axillary nodes normal.  No results found for: HGBA1C  Lab Results  Component Value Date   CREATININE 0.87 07/01/2021   CREATININE 0.86 12/29/2020   CREATININE 0.65 05/05/2020    Lab Results  Component Value Date   WBC 6.4 05/05/2020   HGB 13.4 05/05/2020   HCT 40.1 05/05/2020   PLT 190 05/05/2020   GLUCOSE 75 07/01/2021   CHOL 182 12/29/2020   TRIG 77.0 12/29/2020   HDL 68.00 12/29/2020   LDLDIRECT 76.0 08/31/2016   LDLCALC 98 12/29/2020   ALT 10 07/01/2021   AST 16 07/01/2021   NA 137 07/01/2021   K 4.2 07/01/2021   CL 102 07/01/2021   CREATININE 0.87 07/01/2021   BUN 12 07/01/2021   CO2 26 07/01/2021   TSH 4.05 07/01/2021   MICROALBUR <0.7 10/16/2017    DG Chest 2 View  Result Date: 05/05/2020 CLINICAL DATA:  87 year old female with shortness of breath EXAM: CHEST - 2 VIEW COMPARISON:  Chest radiograph dated 09/27/2018 FINDINGS: There are small bilateral pleural effusions with bibasilar atelectasis or infiltrate. Overall increase in the degree of pleural effusions and associated atelectasis compared to the prior radiograph. There is background of emphysema. No pneumothorax. Stable cardiac silhouette. Atherosclerotic calcification of the aorta. No acute osseous pathology. Osteopenia. Old lower thoracic compression fracture. IMPRESSION: Small bilateral pleural effusions with bibasilar  atelectasis or infiltrate. Electronically Signed   By: Anner Crete M.D.   On: 05/05/2020 17:44    Assessment & Plan:   Problem List Items Addressed This Visit       Unprioritized   Aortic atherosclerosis ([place])    Did not tolerate trial of daily rosuvastatin due to myalgias.  Encouraged to repeat trial with reduced frequency (2 times weekly).  She is contemplative.      Benign essential hypertension    Labile, chronic.  Home readings have ranged from 742 systolic to 595.  She takes an exra dose of amlodipine 5 mg for systolic readings > 638      Relevant Orders   Comprehensive metabolic panel (Completed)   Emphysema, unspecified ([place])    Noted on chest x ray done in August to evaluate for shortness of breath during ER visit for hypertensive urgency.  She remains  asymptomatic      Hypothyroidism - Primary    TSH is  at goal on 75 mcg daily of levothyroxine . No changes  today   Lab Results  Component Value Date   TSH 4.05 07/01/2021         Relevant Orders   TSH (Completed)   Mixed hyperlipidemia    Untreated LDL is < 100 with good ratio; however she was advised to resume statin twice weekly as a trial given the aortic atherosclerosis noted on recent imaging studies. She is contemplative,  Lab Results  Component Value Date   CHOL 182 12/29/2020   HDL 68.00 12/29/2020   LDLCALC 98 12/29/2020   LDLDIRECT 76.0 08/31/2016   TRIG 77.0 12/29/2020   CHOLHDL 3 12/29/2020         Myalgia due to statin    Reported in September after trial of Crestor       I am having Kinze Labo. Ghazarian maintain her aspirin EC, PreserVision AREDS 2, dorzolamide, levothyroxine, amLODipine, losartan, rosuvastatin, clopidogrel, cloNIDine, and ketoconazole.  No orders of the defined types were placed in this encounter.   There are no discontinued medications.  Follow-up: No follow-ups on file.   Crecencio Mc, MD

## 2021-07-01 NOTE — Assessment & Plan Note (Signed)
Reported in September after trial of Crestor

## 2021-07-01 NOTE — Assessment & Plan Note (Signed)
Noted on chest x ray done in August to evaluate for shortness of breath during ER visit for hypertensive urgency.  She remains  asymptomatic

## 2021-07-01 NOTE — Assessment & Plan Note (Addendum)
Did not tolerate trial of daily rosuvastatin due to myalgias.  Encouraged to repeat trial with reduced frequency (2 times weekly).  She is contemplative.

## 2021-07-01 NOTE — Patient Instructions (Signed)
You are doing well !  You do not have to take the rosuvastatin (Crestor) since it caused your muscles to hurt.  If you DO want to try it again, start it JUST TWICE A WEEK, NOT DAILY.  You may be able to tolerate this dose.   We are not using it to lower your cholesterol.  We are using it to STABILIZE placque In your arteries so they do not rupture and cause a stroke or heart attack

## 2021-07-01 NOTE — Assessment & Plan Note (Signed)
Labile, chronic.  Home readings have ranged from 655 systolic to 374.  She takes an exra dose of amlodipine 5 mg for systolic readings > 827

## 2021-07-02 ENCOUNTER — Encounter: Payer: Self-pay | Admitting: Internal Medicine

## 2021-07-02 NOTE — Assessment & Plan Note (Signed)
TSH is  at goal on 75 mcg daily of levothyroxine . No changes today   Lab Results  Component Value Date   TSH 4.05 07/01/2021

## 2021-07-02 NOTE — Assessment & Plan Note (Signed)
Untreated LDL is < 100 with good ratio; however she was advised to resume statin twice weekly as a trial given the aortic atherosclerosis noted on recent imaging studies. She is contemplative,  Lab Results  Component Value Date   CHOL 182 12/29/2020   HDL 68.00 12/29/2020   LDLCALC 98 12/29/2020   LDLDIRECT 76.0 08/31/2016   TRIG 77.0 12/29/2020   CHOLHDL 3 12/29/2020

## 2021-07-11 ENCOUNTER — Other Ambulatory Visit: Payer: Self-pay | Admitting: Internal Medicine

## 2021-07-27 DIAGNOSIS — H353123 Nonexudative age-related macular degeneration, left eye, advanced atrophic without subfoveal involvement: Secondary | ICD-10-CM | POA: Diagnosis not present

## 2021-07-27 DIAGNOSIS — H353112 Nonexudative age-related macular degeneration, right eye, intermediate dry stage: Secondary | ICD-10-CM | POA: Diagnosis not present

## 2021-07-27 DIAGNOSIS — H40013 Open angle with borderline findings, low risk, bilateral: Secondary | ICD-10-CM | POA: Diagnosis not present

## 2021-07-27 DIAGNOSIS — H469 Unspecified optic neuritis: Secondary | ICD-10-CM | POA: Diagnosis not present

## 2021-07-27 DIAGNOSIS — H25811 Combined forms of age-related cataract, right eye: Secondary | ICD-10-CM | POA: Diagnosis not present

## 2021-08-30 ENCOUNTER — Other Ambulatory Visit: Payer: Self-pay | Admitting: Internal Medicine

## 2021-08-31 ENCOUNTER — Other Ambulatory Visit: Payer: Self-pay | Admitting: Cardiovascular Disease

## 2021-08-31 NOTE — Telephone Encounter (Signed)
Please schedule 6 month F/U appointment-due in December. Thank you!

## 2021-09-01 NOTE — Telephone Encounter (Signed)
Lmov  

## 2021-09-20 NOTE — Telephone Encounter (Signed)
Attempted to schedule.  

## 2021-09-28 ENCOUNTER — Ambulatory Visit (INDEPENDENT_AMBULATORY_CARE_PROVIDER_SITE_OTHER): Payer: Medicare PPO

## 2021-09-28 VITALS — Ht 60.0 in | Wt 120.0 lb

## 2021-09-28 DIAGNOSIS — Z0000 Encounter for general adult medical examination without abnormal findings: Secondary | ICD-10-CM

## 2021-09-28 NOTE — Progress Notes (Addendum)
Subjective:   Deanna White is a 87 y.o. female who presents for Medicare Annual (Subsequent) preventive examination.  Review of Systems    No ROS.  Medicare Wellness Virtual Visit.  Visual/audio telehealth visit, UTA vital signs.   See social history for additional risk factors.   Cardiac Risk Factors include: advanced age (>4men, >76 women);hypertension     Objective:    Today's Vitals   09/28/21 0911  Weight: 120 lb (54.4 kg)  Height: 5' (1.524 m)   Body mass index is 23.44 kg/m.  Advanced Directives 09/28/2021 09/27/2020 05/05/2020 09/24/2019 09/17/2018 07/27/2018 11/11/2017  Does Patient Have a Medical Advance Directive? No No No Yes Yes No Yes  Type of Advance Directive - - Public librarian;Living will [place] - -  Does patient want to make changes to medical advance directive? - - - No - Patient declined No - Patient declined - -  Copy of Man in Chart? - - - No - copy requested No - copy requested - -  Would patient like information on creating a medical advance directive? No - Patient declined No - Patient declined - - - No - Patient declined -    Current Medications (verified) Outpatient Encounter Medications as of 09/28/2021  Medication Sig   amLODipine (NORVASC) 5 MG tablet TAKE 1 TABLET BY MOUTH EVERY DAY   aspirin EC 81 MG tablet Take 81 mg by mouth at bedtime.   cloNIDine (CATAPRES) 0.1 MG tablet Take 1 tablet (0.1 mg total) by mouth 2 (two) times daily. PLEASE SCHEDULE OFFICE VISIT FOR FURTHER REFILLS. THANK YOU!   clopidogrel (PLAVIX) 75 MG tablet TAKE 1 TABLET BY MOUTH EVERY DAY   dorzolamide (TRUSOPT) 2 % ophthalmic solution Place 1 drop into both eyes 3 (three) times daily.    ketoconazole (NIZORAL) 2 % cream Apply 1 application topically at bedtime.   levothyroxine (SYNTHROID) 75 MCG tablet TAKE 1 TABLET BY MOUTH EVERY DAY   losartan (COZAAR) 100 MG tablet TAKE 1 TABLET BY MOUTH EVERY DAY    Multiple Vitamins-Minerals (PRESERVISION AREDS 2) CAPS Take 1 capsule by mouth 2 (two) times daily.    rosuvastatin (CRESTOR) 10 MG tablet Take 1 tablet (10 mg total) by mouth daily. (Patient not taking: Reported on 07/01/2021)   No facility-administered encounter medications on file as of 09/28/2021.    Allergies (verified) Hctz [hydrochlorothiazide], Adhesive [tape], and Brimonidine   History: Past Medical History:  Diagnosis Date   Actinic keratosis    Allergy    Anemia 02/25/2015   Basal cell carcinoma 06/01/2021   R temple - ED&C   Carotid artery occlusion    Colon polyps    Fall from slipping on ice 11/2013   Heart murmur    Hypertension    Menopause    Pneumonia    Positive TB test    pt denies   Sore throat    Squamous cell carcinoma of skin 04/17/2019   L post lateral ankle, shave removal 10/15/2019   Thyroid disease    Varicose veins    Past Surgical History:  Procedure Laterality Date   ABDOMINAL HYSTERECTOMY     APPENDECTOMY     BILATERAL SALPINGOOPHORECTOMY  1964   CESAREAN SECTION     EYE SURGERY Right March 2016   Cataract   LOWER EXTREMITY ANGIOGRAPHY Left 03/14/2019   Procedure: LOWER EXTREMITY ANGIOGRAPHY;  Surgeon: Katha Cabal, MD;  Location: [facility];  Service:  Cardiovascular;  Laterality: Left;   TONSILLECTOMY     VEIN SURGERY Left    Leg   Family History  Problem Relation Age of Onset   Heart disease Mother        After age 5   Hypertension Mother    Deep vein thrombosis Mother    Heart attack Mother    Kidney disease Father    Alcohol abuse Brother    Heart disease Brother    Cancer Brother        Lung cancer   Cancer Brother        kidney cancer   Varicose Veins Sister    Heart disease Sister        After age 60   Heart attack Sister    Heart disease Sister        After age 39   Other Other        epilepsy   Hypertension Other    Cancer Paternal Grandmother        breast cancer   Cancer Daughter     Social History   Socioeconomic History   Marital status: Widowed    Spouse name: Not on file   Number of children: Not on file   Years of education: Not on file   Highest education level: Not on file  Occupational History   Not on file  Tobacco Use   Smoking status: Never   Smokeless tobacco: Never  Substance and Sexual Activity   Alcohol use: No   Drug use: No   Sexual activity: Never  Other Topics Concern   Not on file  Social History Narrative   Not on file   Social Determinants of Health   Financial Resource Strain: Not on file  Food Insecurity: Not on file  Transportation Needs: Not on file  Physical Activity: Not on file  Stress: Not on file  Social Connections: Not on file    Tobacco Counseling Counseling given: Not Answered   Clinical Intake:  Pre-visit preparation completed: Yes        Diabetes: No  How often do you need to have someone help you when you read instructions, pamphlets, or other written materials from your doctor or pharmacy?: 1 - Never   Interpreter Needed?: No      Activities of Daily Living In your present state of health, do you have any difficulty performing the following activities: 09/28/2021  Hearing? Y  Comment Hearing aids  Vision? N  Difficulty concentrating or making decisions? N  Walking or climbing stairs? N  Dressing or bathing? N  Doing errands, shopping? N  Preparing Food and eating ? N  Using the Toilet? N  In the past six months, have you accidently leaked urine? N  Do you have problems with loss of bowel control? N  Managing your Medications? N  Managing your Finances? N  Housekeeping or managing your Housekeeping? N  Some recent data might be hidden    Patient Care Team: Crecencio Mc, MD as PCP - General (Internal Medicine) Corey Skains, MD as Consulting Physician (Cardiology)  Indicate any recent Medical Services you may have received from other than [facility] providers in the past year  (date may be approximate).     Assessment:   This is a routine wellness examination for Deanna White.  Virtual Visit via Telephone Note  I connected with  [name] on 09/28/21 at  9:00 AM EST by telephone and verified that I am speaking  with the correct person using two identifiers.  Persons participating in the virtual visit: patient/Nurse Health Advisor   I discussed the limitations, risks, security and privacy concerns of performing an evaluation and management service by telephone and the availability of in person appointments. The patient expressed understanding and agreed to proceed.  Interactive audio and video telecommunications were attempted between this nurse and patient, however failed, due to patient having technical difficulties OR patient did not have access to video capability.  We continued and completed visit with audio only.  Some vital signs may be absent or patient reported.   Hearing/Vision screen Hearing Screening - Comments:: Hearing aids Vision Screening - Comments:: Followed by [place]  Wears corrective lenses  OV every 6 months  Cataract extraction, L eye only  They have regular follow up with the ophthalmologist  Dietary issues and exercise activities discussed: Current Exercise Habits: Home exercise routine, Intensity: Mild Regular diet Good water intake   Goals Addressed             This Visit's Progress    Follow up with Primary Care Provider       As needed       Depression Screen [facility] 2/9 Scores 09/28/2021 07/01/2021 09/27/2020 09/24/2019 09/17/2018 08/01/2017 03/01/2016  PHQ - 2 Score 0 0 0 0 0 0 0  PHQ- 9 Score - - - - - 0 -    Fall Risk Fall Risk  09/28/2021 07/01/2021 12/29/2020 09/27/2020 06/09/2020  Falls in the past year? 0 0 1 0 0  Number falls in past yr: 0 - 0 0 -  Injury with Fall? - - 0 0 -  Risk Factor Category  - - - - -  Risk for fall due to : - - - - -  Follow up Falls evaluation completed  Falls evaluation completed Falls evaluation completed Falls evaluation completed Falls evaluation completed    [place]: Home free of loose throw rugs in walkways, pet beds, electrical cords, etc? Yes  Adequate lighting in your home to reduce risk of falls? Yes   ASSISTIVE DEVICES UTILIZED TO PREVENT FALLS: Life alert? Yes  Use of a cane, walker or w/c? No   TIMED UP AND GO: Was the test performed? No .   Cognitive Function:  Patient is alert and oriented x3.  Enjoys reading and playing games for brain health.    6CIT Screen 09/28/2021 09/27/2020 09/24/2019 09/17/2018 08/01/2017  What Year? 0 points 0 points 0 points 0 points 0 points  What month? 0 points 0 points 0 points 0 points 0 points  What time? 0 points 0 points 0 points 0 points 0 points  Count back from 20 0 points 0 points 0 points 0 points 0 points  Months in reverse 0 points 0 points 0 points 0 points 0 points  Repeat phrase - 0 points 2 points 0 points 0 points  Total Score - 0 2 0 0    Immunizations Immunization History  Administered Date(s) Administered   Fluad Quad(high Dose 65+) 06/18/2019   Influenza Split 07/01/2014   Influenza, High Dose Seasonal PF 07/13/2020, 07/16/2021   Influenza-Unspecified 07/05/2016, 07/04/2017, 06/29/2018   PFIZER(Purple Top)SARS-COV-2 Vaccination 10/08/2019, 10/29/2019, 06/30/2020, 02/26/2021, 07/29/2021   Pneumococcal Conjugate-13 02/20/2014   Pneumococcal Polysaccharide-23 10/03/2003, 02/22/2015   Td 10/02/2006   Tdap 02/05/2018    Shingrix Completed?: No.    Education has been provided regarding the importance of this vaccine.  Patient has been advised to call insurance company to determine out of pocket expense if they have not yet received this vaccine. Advised may also receive vaccine at local pharmacy or Health Dept. Verbalized acceptance and understanding.  Screening Tests Health Maintenance  Topic Date Due   COVID-19 Vaccine (6 -  Booster for Pfizer series) 10/14/2021 (Originally 09/23/2021)   Zoster Vaccines- Shingrix (1 of 2) 12/27/2021 (Originally 09/13/1944)   TETANUS/TDAP  02/06/2028   Pneumonia Vaccine 36+ Years old  Completed   INFLUENZA VACCINE  Completed   DEXA SCAN  Completed   HPV VACCINES  Aged Out   Health Maintenance There are no preventive care reminders to display for this patient.  Lung Cancer Screening: (Low Dose CT Chest recommended if Age 36-80 years, 30 pack-year currently smoking OR have quit w/in 15years.) does not qualify.   Hepatitis C Screening: does not qualify  Vision Screening: Recommended annual ophthalmology exams for early detection of glaucoma and other disorders of the eye.  Dental Screening: Recommended annual dental exams for proper oral hygiene  Community Resource Referral / Chronic Care Management: CRR required this visit?  No   CCM required this visit?  No      Plan:   Keep all routine maintenance appointments.   I have personally reviewed and noted the following in the patients chart:   Medical and social history Use of alcohol, tobacco or illicit drugs  Current medications and supplements including opioid prescriptions. Not taking opioid.  Functional ability and status Nutritional status Physical activity Advanced directives List of other physicians Hospitalizations, surgeries, and ER visits in previous 12 months Vitals Screenings to include cognitive, depression, and falls Referrals and appointments  In addition, I have reviewed and discussed with patient certain preventive protocols, quality metrics, and best practice recommendations. A written personalized care plan for preventive services as well as general preventive health recommendations were provided to patient.     OBrien-Blaney, Kehlani Vancamp L, LPN   [id number]      I have reviewed the above information and agree with above.   Deborra Medina, MD

## 2021-09-28 NOTE — Patient Instructions (Addendum)
[name] , Thank you for taking time to come for your Medicare Wellness Visit. I appreciate your ongoing commitment to your health goals. Please review the following plan we discussed and let me know if I can assist you in the future.   These are the goals we discussed:  Goals      Follow up with Primary Care Provider     As needed        This is a list of the screening recommended for you and due dates:  Health Maintenance  Topic Date Due   COVID-19 Vaccine (6 - Booster for Pfizer series) 10/14/2021*   Zoster (Shingles) Vaccine (1 of 2) 12/27/2021*   Tetanus Vaccine  02/06/2028   Pneumonia Vaccine  Completed   Flu Shot  Completed   DEXA scan (bone density measurement)  Completed   HPV Vaccine  Aged Out  *Topic was postponed. The date shown is not the original due date.    Advanced directives: not yet completed  Conditions/risks identified: none new  Follow up in one year for your annual wellness visit    Preventive Care 65 Years and Older, Female Preventive care refers to lifestyle choices and visits with your health care provider that can promote health and wellness. What does preventive care include? A yearly physical exam. This is also called an annual well check. Dental exams once or twice a year. Routine eye exams. Ask your health care provider how often you should have your eyes checked. Personal lifestyle choices, including: Daily care of your teeth and gums. Regular physical activity. Eating a healthy diet. Avoiding tobacco and drug use. Limiting alcohol use. Practicing safe sex. Taking low-dose aspirin every day. Taking vitamin and mineral supplements as recommended by your health care provider. What happens during an annual well check? The services and screenings done by your health care provider during your annual well check will depend on your age, overall health, lifestyle risk factors, and family history of disease. Counseling  Your health care  provider may ask you questions about your: Alcohol use. Tobacco use. Drug use. Emotional well-being. Home and relationship well-being. Sexual activity. Eating habits. History of falls. Memory and ability to understand (cognition). Work and work Statistician. Reproductive health. Screening  You may have the following tests or measurements: Height, weight, and BMI. Blood pressure. Lipid and cholesterol levels. These may be checked every 5 years, or more frequently if you are over 33 years old. Skin check. Lung cancer screening. You may have this screening every year starting at age 12 if you have a 30-pack-year history of smoking and currently smoke or have quit within the past 15 years. Fecal occult blood test (FOBT) of the stool. You may have this test every year starting at age 71. Flexible sigmoidoscopy or colonoscopy. You may have a sigmoidoscopy every 5 years or a colonoscopy every 10 years starting at age 37. Hepatitis C blood test. Hepatitis B blood test. Sexually transmitted disease (STD) testing. Diabetes screening. This is done by checking your blood sugar (glucose) after you have not eaten for a while (fasting). You may have this done every 1-3 years. Bone density scan. This is done to screen for osteoporosis. You may have this done starting at age 70. Mammogram. This may be done every 1-2 years. Talk to your health care provider about how often you should have regular mammograms. Talk with your health care provider about your test results, treatment options, and if necessary, the need for more tests. Vaccines  Your health care provider may recommend certain vaccines, such as: Influenza vaccine. This is recommended every year. Tetanus, diphtheria, and acellular pertussis (Tdap, Td) vaccine. You may need a Td booster every 10 years. Zoster vaccine. You may need this after age 28. Pneumococcal 13-valent conjugate (PCV13) vaccine. One dose is recommended after age  20. Pneumococcal polysaccharide (PPSV23) vaccine. One dose is recommended after age 52. Talk to your health care provider about which screenings and vaccines you need and how often you need them. This information is not intended to replace advice given to you by your health care provider. Make sure you discuss any questions you have with your health care provider. Document Released: 10/15/2015 Document Revised: 06/07/2016 Document Reviewed: 07/20/2015 Elsevier Interactive Patient Education  2017 [place] Prevention in the Home Falls can cause injuries. They can happen to people of all ages. There are many things you can do to make your home safe and to help prevent falls. What can I do on the outside of my home? Regularly fix the edges of walkways and driveways and fix any cracks. Remove anything that might make you trip as you walk through a door, such as a raised step or threshold. Trim any bushes or trees on the path to your home. Use bright outdoor lighting. Clear any walking paths of anything that might make someone trip, such as rocks or tools. Regularly check to see if handrails are loose or broken. Make sure that both sides of any steps have handrails. Any raised decks and porches should have guardrails on the edges. Have any leaves, snow, or ice cleared regularly. Use sand or salt on walking paths during winter. Clean up any spills in your garage right away. This includes oil or grease spills. What can I do in the bathroom? Use night lights. Install grab bars by the toilet and in the tub and shower. Do not use towel bars as grab bars. Use non-skid mats or decals in the tub or shower. If you need to sit down in the shower, use a plastic, non-slip stool. Keep the floor dry. Clean up any water that spills on the floor as soon as it happens. Remove soap buildup in the tub or shower regularly. Attach bath mats securely with double-sided non-slip rug tape. Do not have throw  rugs and other things on the floor that can make you trip. What can I do in the bedroom? Use night lights. Make sure that you have a light by your bed that is easy to reach. Do not use any sheets or blankets that are too big for your bed. They should not hang down onto the floor. Have a firm chair that has side arms. You can use this for support while you get dressed. Do not have throw rugs and other things on the floor that can make you trip. What can I do in the kitchen? Clean up any spills right away. Avoid walking on wet floors. Keep items that you use a lot in easy-to-reach places. If you need to reach something above you, use a strong step stool that has a grab bar. Keep electrical cords out of the way. Do not use floor polish or wax that makes floors slippery. If you must use wax, use non-skid floor wax. Do not have throw rugs and other things on the floor that can make you trip. What can I do with my stairs? Do not leave any items on the stairs. Make sure that there are  handrails on both sides of the stairs and use them. Fix handrails that are broken or loose. Make sure that handrails are as long as the stairways. Check any carpeting to make sure that it is firmly attached to the stairs. Fix any carpet that is loose or worn. Avoid having throw rugs at the top or bottom of the stairs. If you do have throw rugs, attach them to the floor with carpet tape. Make sure that you have a light switch at the top of the stairs and the bottom of the stairs. If you do not have them, ask someone to add them for you. What else can I do to help prevent falls? Wear shoes that: Do not have high heels. Have rubber bottoms. Are comfortable and fit you well. Are closed at the toe. Do not wear sandals. If you use a stepladder: Make sure that it is fully opened. Do not climb a closed stepladder. Make sure that both sides of the stepladder are locked into place. Ask someone to hold it for you, if  possible. Clearly mark and make sure that you can see: Any grab bars or handrails. First and last steps. Where the edge of each step is. Use tools that help you move around (mobility aids) if they are needed. These include: Canes. Walkers. Scooters. Crutches. Turn on the lights when you go into a dark area. Replace any light bulbs as soon as they burn out. Set up your furniture so you have a clear path. Avoid moving your furniture around. If any of your floors are uneven, fix them. If there are any pets around you, be aware of where they are. Review your medicines with your doctor. Some medicines can make you feel dizzy. This can increase your chance of falling. Ask your doctor what other things that you can do to help prevent falls. This information is not intended to replace advice given to you by your health care provider. Make sure you discuss any questions you have with your health care provider. Document Released: 07/15/2009 Document Revised: 02/24/2016 Document Reviewed: 10/23/2014 Elsevier Interactive Patient Education  2017 [employer].

## 2021-09-29 ENCOUNTER — Ambulatory Visit: Payer: Medicare PPO | Admitting: Podiatry

## 2021-09-29 ENCOUNTER — Other Ambulatory Visit: Payer: Self-pay

## 2021-09-29 ENCOUNTER — Encounter: Payer: Self-pay | Admitting: Podiatry

## 2021-09-29 DIAGNOSIS — M79674 Pain in right toe(s): Secondary | ICD-10-CM | POA: Diagnosis not present

## 2021-09-29 DIAGNOSIS — B351 Tinea unguium: Secondary | ICD-10-CM

## 2021-09-29 DIAGNOSIS — M79675 Pain in left toe(s): Secondary | ICD-10-CM | POA: Diagnosis not present

## 2021-09-29 NOTE — Progress Notes (Signed)
This patient returns to my office for at risk foot care.  This patient requires this care by a professional since this patient will be at risk due to having venous insufficiency and PVD.    This patient is unable to cut nails herself since the patient cannot reach her nails.These nails are painful walking and wearing shoes.    Patient says she has corns on her fourth toes both feet.  This patient presents for at risk foot care today.  General Appearance  Alert, conversant and in no acute stress.  Vascular  Dorsalis pedis pulses are  Weakly palpable  B/L.  Posterior tibial pulses are absent  B/L.  bilaterally.  Capillary return is within normal limits  bilaterally. Cold feet bilaterally.Absent digital hair.  Neurologic  Senn-Weinstein monofilament wire test within normal limits  bilaterally. Muscle power within normal limits bilaterally.  Nails Thick disfigured discolored nails with subungual debris  from hallux to fifth toes bilaterally. No evidence of bacterial infection or drainage bilaterally.  Orthopedic  No limitations of motion  feet .  No crepitus or effusions noted.  No bony pathology or digital deformities noted.HT 2-4  B/L.  Adductovarus fifth toes  B/L  Pain noted upon palpation inside border fifth toe right.  No corn noted.  Skin  normotropic skin with no porokeratosis noted bilaterally.  No signs of infections or ulcers noted.     Onychomycosis  Pain in right toes  Pain in left toes  Consent was obtained for treatment procedures.   Mechanical debridement of nails 1-5  bilaterally performed with a nail nipper.  Filed with dremel without incident. Padding dispensed to pad and separate 4/5 toes  B/L.   Return office visit   3 months                  Told patient to return for periodic foot care and evaluation due to potential at risk complications.   Gardiner Barefoot DPM

## 2021-10-06 ENCOUNTER — Other Ambulatory Visit (INDEPENDENT_AMBULATORY_CARE_PROVIDER_SITE_OTHER): Payer: Self-pay | Admitting: Nurse Practitioner

## 2021-10-06 DIAGNOSIS — I739 Peripheral vascular disease, unspecified: Secondary | ICD-10-CM

## 2021-10-10 ENCOUNTER — Encounter (INDEPENDENT_AMBULATORY_CARE_PROVIDER_SITE_OTHER): Payer: Self-pay | Admitting: Vascular Surgery

## 2021-10-10 ENCOUNTER — Other Ambulatory Visit: Payer: Self-pay

## 2021-10-10 ENCOUNTER — Ambulatory Visit (INDEPENDENT_AMBULATORY_CARE_PROVIDER_SITE_OTHER): Payer: Medicare PPO

## 2021-10-10 ENCOUNTER — Ambulatory Visit (INDEPENDENT_AMBULATORY_CARE_PROVIDER_SITE_OTHER): Payer: Medicare PPO | Admitting: Vascular Surgery

## 2021-10-10 VITALS — BP 162/75 | Ht 62.0 in | Wt 122.0 lb

## 2021-10-10 DIAGNOSIS — I739 Peripheral vascular disease, unspecified: Secondary | ICD-10-CM | POA: Diagnosis not present

## 2021-10-10 DIAGNOSIS — I480 Paroxysmal atrial fibrillation: Secondary | ICD-10-CM | POA: Diagnosis not present

## 2021-10-10 DIAGNOSIS — I872 Venous insufficiency (chronic) (peripheral): Secondary | ICD-10-CM | POA: Diagnosis not present

## 2021-10-10 DIAGNOSIS — E782 Mixed hyperlipidemia: Secondary | ICD-10-CM

## 2021-10-10 DIAGNOSIS — J439 Emphysema, unspecified: Secondary | ICD-10-CM | POA: Diagnosis not present

## 2021-10-10 DIAGNOSIS — I10 Essential (primary) hypertension: Secondary | ICD-10-CM | POA: Diagnosis not present

## 2021-10-12 ENCOUNTER — Encounter (INDEPENDENT_AMBULATORY_CARE_PROVIDER_SITE_OTHER): Payer: Self-pay | Admitting: Vascular Surgery

## 2021-10-12 ENCOUNTER — Other Ambulatory Visit: Payer: Self-pay | Admitting: Cardiovascular Disease

## 2021-10-12 NOTE — Progress Notes (Signed)
MRN : [id number]  Deanna White is a 88 y.o. (Aug 26, 1925) female who presents with chief complaint of check my circulation.  History of Present Illness:   The patient returns to the office for followup and review of the noninvasive studies. There have been no interval changes in lower extremity symptoms. No interval shortening of the patient's claudication distance or development of rest pain symptoms. No new ulcers or wounds have occurred since the last visit.  There have been no significant changes to the patient's overall health care.  The patient denies amaurosis fugax or recent TIA symptoms. There are no recent neurological changes noted. The patient denies history of DVT, PE or superficial thrombophlebitis. The patient denies recent episodes of angina or shortness of breath.   ABI Rt=1.07 and Lt=1.10  (previous ABI's Rt=0.96 and Lt=1.16)   Current Meds  Medication Sig   amLODipine (NORVASC) 5 MG tablet TAKE 1 TABLET BY MOUTH EVERY DAY   aspirin EC 81 MG tablet Take 81 mg by mouth at bedtime.   cloNIDine (CATAPRES) 0.1 MG tablet Take 1 tablet (0.1 mg total) by mouth 2 (two) times daily. PLEASE SCHEDULE OFFICE VISIT FOR FURTHER REFILLS. THANK YOU!   clopidogrel (PLAVIX) 75 MG tablet TAKE 1 TABLET BY MOUTH EVERY DAY   dorzolamide (TRUSOPT) 2 % ophthalmic solution Place 1 drop into both eyes 3 (three) times daily.    ketoconazole (NIZORAL) 2 % cream Apply 1 application topically at bedtime.   levothyroxine (SYNTHROID) 75 MCG tablet TAKE 1 TABLET BY MOUTH EVERY DAY   losartan (COZAAR) 100 MG tablet TAKE 1 TABLET BY MOUTH EVERY DAY   Multiple Vitamins-Minerals (PRESERVISION AREDS 2) CAPS Take 1 capsule by mouth 2 (two) times daily.    rosuvastatin (CRESTOR) 10 MG tablet Take 1 tablet (10 mg total) by mouth daily.    Past Medical History:  Diagnosis Date   Actinic keratosis    Allergy    Anemia 02/25/2015   Basal cell carcinoma 06/01/2021   R temple - ED&C   Carotid artery  occlusion    Colon polyps    Fall from slipping on ice 11/2013   Heart murmur    Hypertension    Menopause    Pneumonia    Positive TB test    pt denies   Sore throat    Squamous cell carcinoma of skin 04/17/2019   L post lateral ankle, shave removal 10/15/2019   Thyroid disease    Varicose veins     Past Surgical History:  Procedure Laterality Date   ABDOMINAL HYSTERECTOMY     APPENDECTOMY     BILATERAL SALPINGOOPHORECTOMY  1964   CESAREAN SECTION     EYE SURGERY Right March 2016   Cataract   LOWER EXTREMITY ANGIOGRAPHY Left 03/14/2019   Procedure: LOWER EXTREMITY ANGIOGRAPHY;  Surgeon: Katha Cabal, MD;  Location: [facility];  Service: Cardiovascular;  Laterality: Left;   TONSILLECTOMY     VEIN SURGERY Left    Leg    Social History Social History   Tobacco Use   Smoking status: Never   Smokeless tobacco: Never  Substance Use Topics   Alcohol use: No   Drug use: No    Family History Family History  Problem Relation Age of Onset   Heart disease Mother        After age 79   Hypertension Mother    Deep vein thrombosis Mother    Heart attack Mother    Kidney disease  Father    Alcohol abuse Brother    Heart disease Brother    Cancer Brother        Lung cancer   Cancer Brother        kidney cancer   Varicose Veins Sister    Heart disease Sister        After age 55   Heart attack Sister    Heart disease Sister        After age 26   Other Other        epilepsy   Hypertension Other    Cancer Paternal Grandmother        breast cancer   Cancer Daughter     Allergies  Allergen Reactions   Hctz [Hydrochlorothiazide] Other (See Comments)    Hyponatremia    Adhesive [Tape] Rash    Peels skin   Brimonidine Rash and Other (See Comments)    Pt felt as if throat was closing up, pt also had rash     REVIEW OF SYSTEMS (Negative unless checked)  Constitutional: [] Weight loss  [] Fever  [] Chills Cardiac: [] Chest pain   [] Chest pressure    [] Palpitations   [] Shortness of breath when laying flat   [] Shortness of breath with exertion. Vascular:  [x] Pain in legs with walking   [] Pain in legs at rest  [] History of DVT   [] Phlebitis   [] Swelling in legs   [] Varicose veins   [] Non-healing ulcers Pulmonary:   [] Uses home oxygen   [] Productive cough   [] Hemoptysis   [] Wheeze  [] COPD   [] Asthma Neurologic:  [] Dizziness   [] Seizures   [] History of stroke   [] History of TIA  [] Aphasia   [] Vissual changes   [] Weakness or numbness in arm   [x] Weakness or numbness in leg Musculoskeletal:   [] Joint swelling   [x] Joint pain   [x] Low back pain Hematologic:  [] Easy bruising  [] Easy bleeding   [] Hypercoagulable state   [] Anemic Gastrointestinal:  [] Diarrhea   [] Vomiting  [] Gastroesophageal reflux/heartburn   [] Difficulty swallowing. Genitourinary:  [] Chronic kidney disease   [] Difficult urination  [] Frequent urination   [] Blood in urine Skin:  [] Rashes   [] Ulcers  Psychological:  [] History of anxiety   []  History of major depression.  Physical Examination  Vitals:   10/10/21 1012  BP: (!) 162/75  Weight: 122 lb (55.3 kg)  Height: 5\' 2"  (1.575 m)   Body mass index is 22.31 kg/m. Gen: WD/WN, NAD Head: [place]/AT, No temporalis wasting.  Ear/Nose/Throat: Hearing grossly intact, nares w/o erythema or drainage Eyes: PER, EOMI, sclera nonicteric.  Neck: Supple, no masses.  No bruit or JVD.  Pulmonary:  Good air movement, no audible wheezing, no use of accessory muscles.  Cardiac: RRR, normal S1, S2, no Murmurs. Vascular:   Vessel Right Left  Radial Palpable Palpable  PT Not palpable Not palpable  DP Not palpable Not palpable  Gastrointestinal: soft, non-distended. No guarding/no peritoneal signs.  Musculoskeletal: M/S 5/5 throughout.  No visible deformity.  Neurologic: CN 2-12 intact. Pain and light touch intact in extremities.  Symmetrical.  Speech is fluent. Motor exam as listed above. Psychiatric: Judgment intact, Mood & affect appropriate  for pt's clinical situation. Dermatologic: No rashes or ulcers noted.  No changes consistent with cellulitis.   CBC Lab Results  Component Value Date   WBC 6.4 05/05/2020   HGB 13.4 05/05/2020   HCT 40.1 05/05/2020   MCV 93.9 05/05/2020   PLT 190 05/05/2020    BMET    Component Value Date/Time  NA 137 07/01/2021 1011   NA 137 12/04/2018 0000   K 4.2 07/01/2021 1011   CL 102 07/01/2021 1011   CO2 26 07/01/2021 1011   GLUCOSE 75 07/01/2021 1011   BUN 12 07/01/2021 1011   BUN 16 12/04/2018 0000   CREATININE 0.87 07/01/2021 1011   CALCIUM 8.9 07/01/2021 1011   GFRNONAA >60 05/05/2020 1449   GFRAA >60 05/05/2020 1449   CrCl cannot be calculated (Patient's most recent lab result is older than the maximum 21 days allowed.).  COAG No results found for: INR, PROTIME  Radiology VAS [place] ABI WITH/WO TBI  Result Date: 10/12/2021  LOWER EXTREMITY DOPPLER STUDY Patient Name:  Deanna White  Date of Exam:   10/10/2021 Medical Rec #: [id number]      Accession #:    [id number] Date of Birth: [birth year]     Patient Gender: F Patient Age:   24 years Exam Location:  [facility] Procedure:      VAS [place] ABI WITH/WO TBI Referring Phys: --------------------------------------------------------------------------------  Indications: Peripheral artery disease.  Comparison Study: 03/2021 Performing Technologist: Concha Norway RVT  Examination Guidelines: A complete evaluation includes at minimum, Doppler waveform signals and systolic blood pressure reading at the level of bilateral brachial, anterior tibial, and posterior tibial arteries, when vessel segments are accessible. Bilateral testing is considered an integral part of a complete examination. Photoelectric Plethysmograph (PPG) waveforms and toe systolic pressure readings are included as required and additional duplex testing as needed. Limited examinations for reoccurring indications may be performed as noted.  ABI Findings:  +---------+------------------+-----+--------+--------+  Right     Rt Pressure (mmHg) Index Waveform Comment   +---------+------------------+-----+--------+--------+  Brachial  164                                         +---------+------------------+-----+--------+--------+  ATA       176                      biphasic 1.07      +---------+------------------+-----+--------+--------+  PTA       157                0.96  biphasic           +---------+------------------+-----+--------+--------+  Great Toe 70                 0.43  Abnormal           +---------+------------------+-----+--------+--------+ +---------+------------------+-----+--------+-------+  Left      Lt Pressure (mmHg) Index Waveform Comment  +---------+------------------+-----+--------+-------+  Brachial  163                                        +---------+------------------+-----+--------+-------+  ATA       173                      biphasic 1.05     +---------+------------------+-----+--------+-------+  PTA       181                1.10  biphasic          +---------+------------------+-----+--------+-------+  Great Toe 74                 0.45  Abnormal          +---------+------------------+-----+--------+-------+ +-------+-----------+-----------+------------+------------+  ABI/TBI Today's ABI Today's TBI Previous ABI Previous TBI  +-------+-----------+-----------+------------+------------+  Right   1.07        .43         .96          .53           +-------+-----------+-----------+------------+------------+  Left    1.10        .45         1.16         .25           +-------+-----------+-----------+------------+------------+ Bilateral ABIs appear essentially unchanged compared to prior study on 03/2021.  Summary: Right: Resting right ankle-brachial index is within normal range. No evidence of significant right lower extremity arterial disease. The right toe-brachial index is abnormal. Left: Resting left ankle-brachial index is within normal range. No  evidence of significant left lower extremity arterial disease. The left toe-brachial index is abnormal.  *See table(s) above for measurements and observations.  Electronically signed by Leotis Pain MD on 10/12/2021 at 10:08:44 AM.    Final      Assessment/Plan There are no diagnoses linked to this encounter.   Hortencia Pilar, MD  10/12/2021 12:34 PM

## 2021-10-12 NOTE — Telephone Encounter (Signed)
Please schedule overdue F/U appointment. Thank you! ?

## 2021-10-13 NOTE — Telephone Encounter (Signed)
Attempted to schedule no ans no vm  

## 2021-10-14 ENCOUNTER — Other Ambulatory Visit: Payer: Self-pay

## 2021-10-14 DIAGNOSIS — I10 Essential (primary) hypertension: Secondary | ICD-10-CM | POA: Insufficient documentation

## 2021-10-14 DIAGNOSIS — I672 Cerebral atherosclerosis: Secondary | ICD-10-CM | POA: Diagnosis not present

## 2021-10-14 DIAGNOSIS — J341 Cyst and mucocele of nose and nasal sinus: Secondary | ICD-10-CM | POA: Diagnosis not present

## 2021-10-14 DIAGNOSIS — I609 Nontraumatic subarachnoid hemorrhage, unspecified: Secondary | ICD-10-CM | POA: Diagnosis not present

## 2021-10-14 DIAGNOSIS — I160 Hypertensive urgency: Secondary | ICD-10-CM | POA: Diagnosis not present

## 2021-10-14 DIAGNOSIS — I2510 Atherosclerotic heart disease of native coronary artery without angina pectoris: Secondary | ICD-10-CM | POA: Diagnosis not present

## 2021-10-14 DIAGNOSIS — Z79899 Other long term (current) drug therapy: Secondary | ICD-10-CM | POA: Insufficient documentation

## 2021-10-14 DIAGNOSIS — R519 Headache, unspecified: Secondary | ICD-10-CM | POA: Diagnosis present

## 2021-10-14 LAB — BASIC METABOLIC PANEL
Anion gap: 7 (ref 5–15)
BUN: 17 mg/dL (ref 8–23)
CO2: 27 mmol/L (ref 22–32)
Calcium: 9.4 mg/dL (ref 8.9–10.3)
Chloride: 100 mmol/L (ref 98–111)
Creatinine, Ser: 0.88 mg/dL (ref 0.44–1.00)
GFR, Estimated: >60 mL/min (ref >60)
Glucose, Bld: 105 mg/dL — ABNORMAL HIGH (ref 70–99)
Potassium: 4.3 mmol/L (ref 3.5–5.1)
Sodium: 134 mmol/L — ABNORMAL LOW (ref 135–145)

## 2021-10-14 LAB — CBC
HCT: 38.1 % (ref 36.0–46.0)
Hemoglobin: 12.7 g/dL (ref 12.0–15.0)
MCH: 32.4 pg (ref 26.0–34.0)
MCHC: 33.3 g/dL (ref 30.0–36.0)
MCV: 97.2 fL (ref 80.0–100.0)
Platelets: 140 10*3/uL — ABNORMAL LOW (ref 150–400)
RBC: 3.92 MIL/uL (ref 3.87–5.11)
RDW: 12.9 % (ref 11.5–15.5)
WBC: 6.5 10*3/uL (ref 4.0–10.5)
nRBC: 0 % (ref 0.0–0.2)

## 2021-10-14 LAB — TROPONIN I (HIGH SENSITIVITY): Troponin I (High Sensitivity): 13 ng/L (ref <18)

## 2021-10-14 NOTE — ED Triage Notes (Signed)
Pt presents to ER from home with c/o high BP.  Pt states she took BP at home and was over 692 systolic.  Pt states she has hx of HTN and takes clonidine and amlodipine at home.  Pt states BP has been running higher last few days.  Denies hx of strokes, but does take plavix.  Pt A&O x4 at this time.  No neurological deficits noted.

## 2021-10-15 ENCOUNTER — Emergency Department: Payer: Medicare PPO

## 2021-10-15 ENCOUNTER — Emergency Department
Admission: EM | Admit: 2021-10-15 | Discharge: 2021-10-15 | Disposition: A | Discharge status: Home / Self Care | Payer: Medicare PPO | Attending: Emergency Medicine | Admitting: Emergency Medicine

## 2021-10-15 DIAGNOSIS — J341 Cyst and mucocele of nose and nasal sinus: Secondary | ICD-10-CM | POA: Diagnosis not present

## 2021-10-15 DIAGNOSIS — I672 Cerebral atherosclerosis: Secondary | ICD-10-CM | POA: Diagnosis not present

## 2021-10-15 DIAGNOSIS — I160 Hypertensive urgency: Secondary | ICD-10-CM

## 2021-10-15 DIAGNOSIS — I609 Nontraumatic subarachnoid hemorrhage, unspecified: Secondary | ICD-10-CM | POA: Diagnosis not present

## 2021-10-15 DIAGNOSIS — I10 Essential (primary) hypertension: Secondary | ICD-10-CM | POA: Diagnosis not present

## 2021-10-15 NOTE — ED Provider Notes (Signed)
[facility] Provider Note    Event Date/Time   First MD Initiated Contact with Patient 10/15/21 [phone]     (approximate)   History   Hypertension   HPI  Deanna White is a 88 y.o. female with a past medical history of coronary artery disease, and HTN who presents accompanied by daughter for assessment of elevated blood pressures noted earlier today associated with generalized weakness and headache.  Patient states she is feeling better and no longer has a headache.  She has been taking her blood pressure medicines including clonidine and amlodipine interconnected as of the clonidine for [place] emergency room.  She states her blood pressure over the last couple weeks has been higher than normal in the 140s to 160s but seemed abnormally high today.  She denies any chest pain, cough, shortness of breath, back pain, vomiting, diarrhea, rash or extremity pain.  No new focal numbness weakness tingling or any other clear associated sick symptoms today.  No medication changes.  No EtOH or illicit drug use.  No vertigo.  No other acute concerns at this time.      Physical Exam  Triage Vital Signs: ED Triage Vitals  Enc Vitals Group     BP 10/14/21 2020 (!) 192/78     Pulse Rate 10/14/21 2020 77     Resp 10/14/21 2020 18     Temp 10/14/21 2020 97.7 F (36.5 C)     Temp Source 10/14/21 2020 Oral     SpO2 10/14/21 2020 96 %     Weight --      Height --      Head Circumference --      Peak Flow --      Pain Score 10/14/21 2021 0     Pain Loc --      Pain Edu? --      Excl. in Towns? --     Most recent vital signs: Vitals:   10/15/21 0530 10/15/21 0530  BP:  (!) 161/68  Pulse:  75  Resp:  16  Temp:    SpO2: 98% 99%    General: Awake, no distress.  CV:  Good peripheral perfusion.  Systolic murmur.  2+ radial pulses. Resp:  Normal effort.  Clear bilaterally Abd:  No distention.  Soft throughout. Other:  Cranial nerves II through XII grossly intact.  No  pronator drift.  No finger dysmetria.  Symmetric 5/5 strength of all extremities.  Sensation intact to light touch in all extremities.      ED Results / Procedures / Treatments  Labs (all labs ordered are listed, but only abnormal results are displayed) Labs Reviewed  CBC - Abnormal; Notable for the following components:      Result Value   Platelets 140 (*)    All other components within normal limits  BASIC METABOLIC PANEL - Abnormal; Notable for the following components:   Sodium 134 (*)    Glucose, Bld 105 (*)    All other components within normal limits  TROPONIN I (HIGH SENSITIVITY)     EKG  ECG is normal sinus rhythm with a PVC and a ventricular rate of 77, normal axis, unremarkable intervals without evidence of acute ischemia or significant arrhythmia.   RADIOLOGY  CT head has no evidence of ischemia, mass-effect, edema, or other acute intracranial process.  This was ordered and reviewed by myself.  I also reviewed radiology interpretation and agree with findings.  They note some chronic sphenoid sinusitis.  PROCEDURES:    MEDICATIONS ORDERED IN ED: Medications - No data to display   IMPRESSION / MDM / [place] / ED COURSE  I reviewed the triage vital signs and the nursing notes.                              Differential diagnosis includes, but is not limited to, hypertensive urgency causing some weakness and headache that seems to have resolved by time patient has come to emergency room.  Additional complicating considerations include possible SAH, CVA, cardiac ischemia or kidney injury.  No focal deficits at this time to suggest a CVA.  ECG is normal sinus rhythm with a PVC and a ventricular rate of 77, normal axis, unremarkable intervals without evidence of acute ischemia or significant arrhythmia.  Troponin is nonelevated not suggestive of ACS or demand ischemia.  BMP shows no significant electrolyte or metabolic derangements.  Kidney function is  within normal limits.  CBC shows no leukocytosis or acute anemia.  CT head without evidence of hemorrhage or other clear acute cranial process.  Blood pressure has seemed to continue to trend downward since, emergency room on last recheck is 169/73.  Given she states she is now asymptomatic with otherwise stable vitals I do not believe she requires any additional emergent lowering of her blood pressure I think she is stable for discharge with outpatient PCP follow-up for further titration of her chronic hypertension medications.  Discharged in stable condition.  Strict return cautions advised and discussed.      FINAL CLINICAL IMPRESSION(S) / ED DIAGNOSES   Final diagnoses:  Hypertensive urgency     Rx / DC Orders   ED Discharge Orders     None        Note:  This document was prepared using Dragon voice recognition software and may include unintentional dictation errors.   Lucrezia Starch, MD 10/15/21 [phone]

## 2021-10-18 ENCOUNTER — Telehealth: Payer: Self-pay | Admitting: Internal Medicine

## 2021-10-18 NOTE — Telephone Encounter (Signed)
Patient Deanna White called in stated blood pressure keeps going up in the evening 227 over 97 ( that was Friday reading ) Patient has been taking  Amlodipine 5MG   Patient stated yesterday reading 154 over 74 , today at 3:00 pm reading was 176/72...access nurse will be calling patient back..I  Advise to patient if get worst she can go to urgent care or emergency room

## 2021-10-20 NOTE — Telephone Encounter (Signed)
LMTCB

## 2021-10-20 NOTE — Telephone Encounter (Signed)
Attempted to schedule.  LMOV to call office.  ° °

## 2021-10-28 ENCOUNTER — Other Ambulatory Visit: Payer: Self-pay | Admitting: Internal Medicine

## 2021-10-28 NOTE — Telephone Encounter (Signed)
Pt states that she already takes the clonidine twice daily. She stated that she takes the losartan, and clonidine in the morning and then takes the amlodipine and clonidine in the evening and then adds a second amlodipine at lunch time if her  BP is elevated.

## 2021-10-28 NOTE — Telephone Encounter (Signed)
Spoke with pt and she stated that her blood pressure is going up in the evenings. She stated that her readings are ranging from the 594 to 707 systolic. This morning it was 141/67 HR 64 and this afternoon around lunch was 173/73 HR 71. Today pt has taken Losartan 100 mg 1 tablet, clonidine 0.1 mg 1 tablet when she saw her bp was up at lunch time she took a 5 mg Amlodipine. Pt stated that she can till the bp was going up because her face starts to feel flushed and she had a nose bleed and just "doesn't feel well". Pt did take the Clopidogrel this morning.

## 2021-10-28 NOTE — Telephone Encounter (Signed)
Spoke with pt to let her know to take the Amlodipine at lunchtime everyday not just as needed and call us on Monday or Tuesday with some readings. Pt gave a verbal understanding.

## 2021-10-28 NOTE — Telephone Encounter (Signed)
She can take 0.1 mg of clonidine now instead of at bedtime .    Going forward  Her regimen should be :   100 mg losartan in the morning.  Amlodipine 5 mg in the morning,  Clonidine 0. 1 mg in the morning.   Amlodipine 5 mg and clonidine 0.1 mg at dinner time.   She should not check her blood pressure more than once daily because she is raising it by checking it

## 2021-10-28 NOTE — Telephone Encounter (Signed)
Pt informed/verbalized understanding

## 2021-10-28 NOTE — Telephone Encounter (Signed)
Last apt 07/01/21 Next apt 12/29/21 Pt was in ER on 1/14 for elevated HTN but was released with normal BP and advised to f/u with PCP if elevations continue. Pt contacted office on 1/17 with update on BP and has f/u apt on 3/30. Pt has not returned call from 1/17. Sent in refill

## 2021-10-28 NOTE — Telephone Encounter (Signed)
Pt called back stating that she just checked her bp again and it is 205/89 in the left arm and 193/90 in the right arm. Pt is wanting to know if she should go ahead and take her second dose of amlodipine now or wait till see normals takes it with her evening clonidine. Pt stated that she does not feel any different then she did when she took it at lunch time and it was 141/67.

## 2021-11-01 ENCOUNTER — Telehealth: Payer: Self-pay | Admitting: Internal Medicine

## 2021-11-01 NOTE — Telephone Encounter (Signed)
Patient called in still having high blood pressure 183/85 last night at 2:30pm  169/77 patient feeling weak, transfer patient access

## 2021-11-01 NOTE — Telephone Encounter (Signed)
Spoke with pt and she stated that when she checked her bp last night before going to bed it was 183/85 and then checked it again today at 2 pm and it was 169/77. Pt stated that she is added a third Clonidine dose last night when she checked her bp and it was so high. Pt stated that she was not feeling bad she just thought she would check it since she hadn't checked it in a couple of days. Pt was advised not to adjust her medications without making sure its okay with her doctor first. Pt gave a verbal understanding. Pt wanted to schedule an appt for a follow up with you so I have scheduled her for the next available on 11/09/2021.

## 2021-11-01 NOTE — Telephone Encounter (Addendum)
Pt was cold call drop into [facility] phone system. Pt stated that her blood pressure has been very high. Pt stated that last night at 10pm her blood pressure was 183/85. Pt stated that today at 2pm, blood pressure was 169/77. Pt stated she is not having any numbness or tingling in limbs. Pt stated that access nurse advise her to advise our office that she needs to be seen in 2 to 3 days. Access [facility] was not on the line when I took the call. No avail appt.

## 2021-11-02 MED ORDER — CLONIDINE HCL 0.1 MG PO TABS: 0.1000 mg | ORAL_TABLET | Freq: Three times a day (TID) | ORAL | 0 refills | Status: DC

## 2021-11-02 NOTE — Addendum Note (Signed)
Addended by: Crecencio Mc on: 11/02/2021 01:15 PM   Modules accepted: Orders

## 2021-11-02 NOTE — Telephone Encounter (Signed)
Pt is aware of medication changes and that she needs to keep her appt on 11/09/2021. Pt gave a verbal understanding.

## 2021-11-02 NOTE — Telephone Encounter (Signed)
She can increase her evening dose of clonidine to 0.2 mg , continue 0.1 mg in the morning.  Contiue 5 mg amlodipine  2 TIMES DAILY  KEEP FOLLOW UP APPT

## 2021-11-09 ENCOUNTER — Other Ambulatory Visit: Payer: Self-pay

## 2021-11-09 ENCOUNTER — Ambulatory Visit: Payer: Medicare PPO | Admitting: Internal Medicine

## 2021-11-09 ENCOUNTER — Encounter: Payer: Self-pay | Admitting: Internal Medicine

## 2021-11-09 DIAGNOSIS — I10 Essential (primary) hypertension: Secondary | ICD-10-CM | POA: Diagnosis not present

## 2021-11-09 MED ORDER — FUROSEMIDE 20 MG PO TABS: 20.0000 mg | ORAL_TABLET | ORAL | 3 refills | Status: DC

## 2021-11-09 NOTE — Progress Notes (Signed)
Subjective:  [name], female    DOB: August 03, 1925  Age: 88 y.o. MRN: [id number]  CC: The encounter diagnosis was Benign essential hypertension.   This visit occurred during the SARS-CoV-2 public health emergency.  Safety protocols were in place, including screening questions prior to the visit, additional usage of staff PPE, and extensive cleaning of exam room while observing appropriate contact time as indicated for disinfecting solutions.    HPI Deanna White presents for follow up on recent elevations in blood pressure Chief Complaint  Patient presents with   Follow-up    Follow up for hypertension.   HTN with CKD:  Over the past 3 weeks patient has been feeling "off" and noting elevations to 412 systolic on previous regimen of amlodipine 5 mg.  Clonidine 0.1 mg bid and losartan 100 mg daily .    she was evaluated in the ER on jan 14  with EKG ,  labs  CT head .  All  studies were normal/negative for acute changes  and BP trended down from  systolic of 878 to 676 with time in the ER without medications.  She was discharged home \ with no medication chnages.   She has been checking BP 2 to 3 times daily   I advised her to increase amlodipine to 10 mg bid, followed by an increase in evening dose of clonidine to 0.2  mg , which she has tolerated. She has noted some fluid retention with the higher dose of amlodipine   Reviewed chartt for workup for secondary causes.   Renal artery doppler done in Feb 2019 at [facility]  no RAS reported .  No history of snoring by family   Has occasional flushing of face,  not associated with headache or diplopia / blurred vision . Does not correlate with hypertensive episodes     Outpatient Medications Prior to Visit  Medication Sig Dispense Refill   cloNIDine (CATAPRES) 0.1 MG tablet Take 1 tablet (0.1 mg total) by mouth 3 (three) times daily. 90 tablet 0   clopidogrel (PLAVIX) 75 MG tablet TAKE 1 TABLET BY MOUTH EVERY DAY 90 tablet 1    dorzolamide (TRUSOPT) 2 % ophthalmic solution Place 1 drop into both eyes 3 (three) times daily.   12   ketoconazole (NIZORAL) 2 % cream Apply 1 application topically at bedtime. 60 g 11   levothyroxine (SYNTHROID) 75 MCG tablet TAKE 1 TABLET BY MOUTH EVERY DAY 90 tablet 1   losartan (COZAAR) 100 MG tablet TAKE 1 TABLET BY MOUTH EVERY DAY 90 tablet 1   Multiple Vitamins-Minerals (PRESERVISION AREDS 2) CAPS Take 1 capsule by mouth 2 (two) times daily.      rosuvastatin (CRESTOR) 10 MG tablet Take 1 tablet (10 mg total) by mouth daily. 90 tablet 3   amLODipine (NORVASC) 5 MG tablet TAKE 1 TABLET BY MOUTH EVERY DAY 90 tablet 1   aspirin EC 81 MG tablet Take 81 mg by mouth at bedtime. (Patient not taking: Reported on 11/09/2021)     No facility-administered medications prior to visit.    Review of Systems;  Patient denies headache, fevers, malaise, unintentional weight loss, skin rash, eye pain, sinus congestion and sinus pain, sore throat, dysphagia,  hemoptysis , cough, dyspnea, wheezing, chest pain, palpitations, orthopnea, edema, abdominal pain, nausea, melena, diarrhea, constipation, flank pain, dysuria, hematuria, urinary  Frequency, nocturia, numbness, tingling, seizures,  Focal weakness, Loss of consciousness,  Tremor, insomnia, depression, anxiety, and suicidal ideation.  Objective:  BP (!) 146/62 (BP Location: Left Arm, Patient Position: Sitting, Cuff Size: Small)    Pulse 70    Temp (!) 97 F (36.1 C) (Oral)    Ht 5\' 2"  (1.575 m)    Wt 123 lb 3.2 oz (55.9 kg)    SpO2 97%    BMI 22.53 kg/m   BP Readings from Last 3 Encounters:  11/09/21 (!) 146/62  10/15/21 (!) 161/68  10/10/21 (!) 162/75    Wt Readings from Last 3 Encounters:  11/09/21 123 lb 3.2 oz (55.9 kg)  10/10/21 122 lb (55.3 kg)  09/28/21 120 lb (54.4 kg)    General appearance: alert, cooperative and appears stated age Ears: normal TM's and external ear canals both ears Throat: lips, mucosa, and tongue normal;  teeth and gums normal Neck: no adenopathy, no carotid bruit, supple, symmetrical, trachea midline and thyroid not enlarged, symmetric, no tenderness/mass/nodules Back: symmetric, no curvature. ROM normal. No CVA tenderness. Lungs: clear to auscultation bilaterally Heart: regular rate and rhythm, S1, S2 normal, no murmur, click, rub or gallop Abdomen: soft, non-tender; bowel sounds normal; no masses,  no organomegaly Pulses: 2+ and symmetric Skin: Skin color, texture, turgor normal. No rashes or lesions Lymph nodes: Cervical, supraclavicular, and axillary nodes normal.  No results found for: HGBA1C  Lab Results  Component Value Date   CREATININE 0.88 10/14/2021   CREATININE 0.87 07/01/2021   CREATININE 0.86 12/29/2020    Lab Results  Component Value Date   WBC 6.5 10/14/2021   HGB 12.7 10/14/2021   HCT 38.1 10/14/2021   PLT 140 (L) 10/14/2021   GLUCOSE 105 (H) 10/14/2021   CHOL 182 12/29/2020   TRIG 77.0 12/29/2020   HDL 68.00 12/29/2020   LDLDIRECT 76.0 08/31/2016   LDLCALC 98 12/29/2020   ALT 10 07/01/2021   AST 16 07/01/2021   NA 134 (L) 10/14/2021   K 4.3 10/14/2021   CL 100 10/14/2021   CREATININE 0.88 10/14/2021   BUN 17 10/14/2021   CO2 27 10/14/2021   TSH 4.05 07/01/2021   MICROALBUR <0.7 10/16/2017    CT HEAD WO CONTRAST (5MM)  Result Date: 10/15/2021 CLINICAL DATA:  Evaluate for subarachnoid hemorrhage. History of hypertension increasing recently. EXAM: CT HEAD WITHOUT CONTRAST TECHNIQUE: Contiguous axial images were obtained from the base of the skull through the vertex without intravenous contrast. RADIATION DOSE REDUCTION: This exam was performed according to the departmental dose-optimization program which includes automated exposure control, adjustment of the mA and/or kV according to patient size and/or use of iterative reconstruction technique. COMPARISON:  CT 11/11/2017. FINDINGS: Brain: No evidence of acute infarction, hemorrhage, hydrocephalus,  extra-axial collection or mass lesion/mass effect. There is mild cerebral atrophy and small-vessel disease. Cerebellum and brainstem are unremarkable. There is no midline shift or basal cisternal effacement. Vascular: There are calcifications in the carotid siphons but no hyperdense central vessels. Scattered calcification left vertebral artery. Skull: Normal. Negative for fracture or focal lesion. Sinuses/Orbits: Old left lens extraction/replacement. 1.5 cm retention cyst right maxillary sinus floor. Chronic opacification right sphenoid sinus with hyperostosis. Other sinuses and mastoid air cells are clear. Other: None. IMPRESSION: 1. No acute intracranial CT findings or interval changes. 2. Chronic opacification of the right sphenoid sinus. Electronically Signed   By: Telford Nab M.D.   On: 10/15/2021 06:00    Assessment & Plan:   Problem List Items Addressed This Visit     Benign essential hypertension    Recent elevations noted;  The cause is unclear.  ER visit reviewed in detail/  She has no signs or symptoms of OSA, no aortic stenosis by 2019  [facility]).  Repeating renal artery doppler . Continue clonidine 0.1 mg qam and 0.2 mg qpm.  Amlodipine 5 mg bid, and losartan 100 mg daily.  Adding furosemide 20 mg daily        Relevant Medications   furosemide (LASIX) 20 MG tablet   amLODipine (NORVASC) 5 MG tablet   Other Relevant Orders   VAS US RENAL ARTERY DUPLEX    I spent 30 minutes dedicated to the care of this patient on the date of this encounter to include pre-visit review of patient's medical history,  most recent imaging studies, Face-to-face time with the patient , and post visit ordering of testing and therapeutics.    Follow-up: No follow-ups on file.   Crecencio Mc, MD

## 2021-11-09 NOTE — Patient Instructions (Signed)
Continue your current blood pressure medications  Adding furosemide every other day if needed for swelling   I will schedule an ultrasound of your renal arteries through Dr Nino Parsley office to rule out stenosis as the cause for your blood pressure elevations   Do not check BP daily.  Only if feeling funny

## 2021-11-10 MED ORDER — AMLODIPINE BESYLATE 5 MG PO TABS: 5.0000 mg | ORAL_TABLET | Freq: Two times a day (BID) | ORAL | 1 refills | Status: DC

## 2021-11-10 NOTE — Assessment & Plan Note (Addendum)
Recent elevations noted;  The cause is unclear.    ER visit reviewed in detail/  She has no signs or symptoms of OSA, no aortic stenosis by 2019  [facility]).  Repeating renal artery doppler . Continue clonidine 0.1 mg qam and 0.2 mg qpm.  Amlodipine 5 mg bid, and losartan 100 mg daily.  Adding furosemide 20 mg daily

## 2021-11-28 ENCOUNTER — Other Ambulatory Visit: Payer: Self-pay

## 2021-11-28 ENCOUNTER — Ambulatory Visit (INDEPENDENT_AMBULATORY_CARE_PROVIDER_SITE_OTHER): Payer: Medicare PPO

## 2021-11-28 DIAGNOSIS — I10 Essential (primary) hypertension: Secondary | ICD-10-CM | POA: Diagnosis not present

## 2021-12-12 ENCOUNTER — Other Ambulatory Visit: Payer: Self-pay | Admitting: Internal Medicine

## 2021-12-12 ENCOUNTER — Telehealth: Payer: Self-pay | Admitting: Internal Medicine

## 2021-12-12 MED ORDER — CLONIDINE HCL 0.1 MG PO TABS: 0.1000 mg | ORAL_TABLET | Freq: Three times a day (TID) | ORAL | 2 refills | Status: DC

## 2021-12-12 NOTE — Telephone Encounter (Signed)
Medication has been refilled.

## 2021-12-12 NOTE — Telephone Encounter (Signed)
Pt need refill on clonidine sent to [facility] [address]  ?

## 2021-12-27 DIAGNOSIS — H40013 Open angle with borderline findings, low risk, bilateral: Secondary | ICD-10-CM | POA: Diagnosis not present

## 2021-12-27 DIAGNOSIS — H353112 Nonexudative age-related macular degeneration, right eye, intermediate dry stage: Secondary | ICD-10-CM | POA: Diagnosis not present

## 2021-12-27 DIAGNOSIS — H25811 Combined forms of age-related cataract, right eye: Secondary | ICD-10-CM | POA: Diagnosis not present

## 2021-12-27 DIAGNOSIS — H469 Unspecified optic neuritis: Secondary | ICD-10-CM | POA: Diagnosis not present

## 2021-12-29 ENCOUNTER — Ambulatory Visit: Payer: Medicare PPO | Admitting: Internal Medicine

## 2021-12-29 ENCOUNTER — Ambulatory Visit: Payer: Medicare PPO | Admitting: Podiatry

## 2021-12-29 ENCOUNTER — Encounter: Payer: Self-pay | Admitting: Podiatry

## 2021-12-29 ENCOUNTER — Encounter: Payer: Self-pay | Admitting: Internal Medicine

## 2021-12-29 VITALS — BP 132/80 | HR 59 | Temp 97.6°F | Ht 62.0 in | Wt 120.0 lb

## 2021-12-29 DIAGNOSIS — E034 Atrophy of thyroid (acquired): Secondary | ICD-10-CM | POA: Diagnosis not present

## 2021-12-29 DIAGNOSIS — I480 Paroxysmal atrial fibrillation: Secondary | ICD-10-CM

## 2021-12-29 DIAGNOSIS — D649 Anemia, unspecified: Secondary | ICD-10-CM

## 2021-12-29 DIAGNOSIS — R059 Cough, unspecified: Secondary | ICD-10-CM

## 2021-12-29 DIAGNOSIS — B351 Tinea unguium: Secondary | ICD-10-CM

## 2021-12-29 DIAGNOSIS — M79674 Pain in right toe(s): Secondary | ICD-10-CM | POA: Diagnosis not present

## 2021-12-29 DIAGNOSIS — M79675 Pain in left toe(s): Secondary | ICD-10-CM

## 2021-12-29 DIAGNOSIS — I10 Essential (primary) hypertension: Secondary | ICD-10-CM

## 2021-12-29 DIAGNOSIS — I700 Atherosclerosis of aorta: Secondary | ICD-10-CM

## 2021-12-29 LAB — COMPREHENSIVE METABOLIC PANEL
ALT: 11 U/L (ref 0–35)
AST: 17 U/L (ref 0–37)
Albumin: 3.8 g/dL (ref 3.5–5.2)
Alkaline Phosphatase: 94 U/L (ref 39–117)
BUN: 21 mg/dL (ref 6–23)
CO2: 26 mEq/L (ref 19–32)
Calcium: 8.9 mg/dL (ref 8.4–10.5)
Chloride: 104 mEq/L (ref 96–112)
Creatinine, Ser: 0.97 mg/dL (ref 0.40–1.20)
GFR: 50.1 mL/min — ABNORMAL LOW (ref >60.00)
Glucose, Bld: 81 mg/dL (ref 70–99)
Potassium: 4.3 mEq/L (ref 3.5–5.1)
Sodium: 137 mEq/L (ref 135–145)
Total Bilirubin: 0.3 mg/dL (ref 0.2–1.2)
Total Protein: 6 g/dL (ref 6.0–8.3)

## 2021-12-29 LAB — CBC WITH DIFFERENTIAL/PLATELET
Basophils Absolute: 0 10*3/uL (ref 0.0–0.1)
Basophils Relative: 0.2 % (ref 0.0–3.0)
Eosinophils Absolute: 0.1 10*3/uL (ref 0.0–0.7)
Eosinophils Relative: 2.1 % (ref 0.0–5.0)
HCT: 33.3 % — ABNORMAL LOW (ref 36.0–46.0)
Hemoglobin: 11.1 g/dL — ABNORMAL LOW (ref 12.0–15.0)
Lymphocytes Relative: 19.1 % (ref 12.0–46.0)
Lymphs Abs: 1.2 10*3/uL (ref 0.7–4.0)
MCHC: 33.3 g/dL (ref 30.0–36.0)
MCV: 97.5 fl (ref 78.0–100.0)
Monocytes Absolute: 0.6 10*3/uL (ref 0.1–1.0)
Monocytes Relative: 10.2 % (ref 3.0–12.0)
Neutro Abs: 4.3 10*3/uL (ref 1.4–7.7)
Neutrophils Relative %: 68.4 % (ref 43.0–77.0)
Platelets: 125 10*3/uL — ABNORMAL LOW (ref 150.0–400.0)
RBC: 3.41 Mil/uL — ABNORMAL LOW (ref 3.87–5.11)
RDW: 14.1 % (ref 11.5–15.5)
WBC: 6.4 10*3/uL (ref 4.0–10.5)

## 2021-12-29 LAB — TSH: TSH: 1.87 u[IU]/mL (ref 0.35–5.50)

## 2021-12-29 MED ORDER — CLONIDINE HCL 0.1 MG PO TABS: ORAL_TABLET | ORAL | 2 refills | Status: DC

## 2021-12-29 NOTE — Patient Instructions (Addendum)
1) For your allergies and cough : ? ?You can Try 25 or 12.5 of dipenhydramine (generic Benadryl) 30 minutes before lying down to manage the cough  ? ?For your allergies ,  You can use Benadryl but you should also consider adding one of these newer second generation antihistamines that are longer acting, non sedating and  available OTC: ? ?Generic  Zyrtec, which is cetirizine.  ? ? generic Allegra , available generically as fexofenadine ; comes in 60 mg and 180 mg once daily strengths.   ? ?Generic Claritin :  also available as loratidine .   ? ?2)  CONTINUE YOUR CURRENT MEDICATION REGIMEN FOR YOUR BLOOD PRESSURE ? ?3) FOR YOUR SPLINTER: ? ?You first need to soften up that callous soaking your hand in epsom salts for 15 minutes daily,  and moisturizing your hands with one of these moisturizers: ? ?Cerave, Curel, Lubriderm  Eucerin or Aveeno   ?

## 2021-12-29 NOTE — Progress Notes (Signed)
? ?Subjective:  ?[name], female    DOB: 1925-09-17  Age: 88 y.o. MRN: [id number] ? ?CC: The primary encounter diagnosis was Benign essential hypertension. Diagnoses of Hypothyroidism due to acquired atrophy of thyroid, Anemia, unspecified type, Paroxysmal A-fib ([place]), Aortic atherosclerosis ([place]), and Cough in adult were also pertinent to this visit. ? ? ?This visit occurred during the SARS-CoV-2 public health emergency.  Safety protocols were in place, including screening questions prior to the visit, additional usage of staff PPE, and extensive cleaning of exam room while observing appropriate contact time as indicated for disinfecting solutions.   ? ?HPI ?Deanna White presents for follow up om hypertension , aortic atherosclerosis , PAF and chronic pain  ?Chief Complaint  ?Patient presents with  ? Follow-up  ?  6 mo f/u - HTN  ? ?1) Hypertension: patient checks blood pressure  once weekly at home.  Readings have been for the most part  140/80 at rest . Patient is following a reduce salt diet most days and is taking  clonidine 0.1 mg in am  20.2 qpm .  She  has reduced dose of  amlodipine 5 mg qhs,  taking  losartan 100 mg daily and furosemide 20 mg daily with good tolerance.  She has not had any more ER visits for hypertensive urgency since her January visit at which time she had a head CT which was negative for [facility] and acute ischemia/  ? ? 2) Aortic atherosclerosis:  Reviewed findings of prior CT scan today..  Patient is tolerating high potency statin therapy with crestor 10 mg  ? ?3) Hypothyroid she is taking levothyroxine 75 mg daily  a minimum of  40 minutes prior to food and other medications  ? ?4) recent URI with sore throat and congestion.  Still has a lingering cough at night when supine   ? ? ?Outpatient Medications Prior to Visit  ?Medication Sig Dispense Refill  ? amLODipine (NORVASC) 5 MG tablet Take 1 tablet (5 mg total) by mouth 2 (two) times daily. (Patient taking differently:  Take 5 mg by mouth daily. Once daily) 180 tablet 1  ? clopidogrel (PLAVIX) 75 MG tablet TAKE 1 TABLET BY MOUTH EVERY DAY 90 tablet 1  ? dorzolamide (TRUSOPT) 2 % ophthalmic solution Place 1 drop into both eyes 3 (three) times daily.   12  ? furosemide (LASIX) 20 MG tablet Take 1 tablet (20 mg total) by mouth every other day. As needed for swellling 45 tablet 3  ? ketoconazole (NIZORAL) 2 % cream Apply 1 application topically at bedtime. 60 g 11  ? levothyroxine (SYNTHROID) 75 MCG tablet TAKE 1 TABLET BY MOUTH EVERY DAY 90 tablet 1  ? losartan (COZAAR) 100 MG tablet TAKE 1 TABLET BY MOUTH EVERY DAY 90 tablet 1  ? Multiple Vitamins-Minerals (PRESERVISION AREDS 2) CAPS Take 1 capsule by mouth 2 (two) times daily.     ? rosuvastatin (CRESTOR) 10 MG tablet Take 1 tablet (10 mg total) by mouth daily. 90 tablet 3  ? cloNIDine (CATAPRES) 0.1 MG tablet Take 1 tablet (0.1 mg total) by mouth 3 (three) times daily. 90 tablet 2  ? aspirin EC 81 MG tablet Take 81 mg by mouth at bedtime. (Patient not taking: Reported on 11/09/2021)    ? ?No facility-administered medications prior to visit.  ? ? ?Review of Systems; ? ?Patient denies headache, fevers, malaise, unintentional weight loss, skin rash, eye pain, sinus congestion and sinus pain, sore throat, dysphagia,  hemoptysis ,  cough, dyspnea, wheezing, chest pain, palpitations, orthopnea, edema, abdominal pain, nausea, melena, diarrhea, constipation, flank pain, dysuria, hematuria, urinary  Frequency, nocturia, numbness, tingling, seizures,  Focal weakness, Loss of consciousness,  Tremor, insomnia, depression, anxiety, and suicidal ideation.   ? ? ? ?Objective:  ?BP 132/80 (BP Location: Left Arm, Patient Position: Sitting, Cuff Size: Small)   Pulse (!) 59   Temp 97.6 ?F (36.4 ?C) (Oral)   Ht _0  (1.575 m)   Wt 120 lb (54.4 kg)   SpO2 99%   BMI 21.95 kg/m?  ? ?BP Readings from Last 3 Encounters:  ?12/29/21 132/80  ?11/09/21 (!) 146/62  ?10/15/21 (!) 161/68  ? ? ?Wt Readings  from Last 3 Encounters:  ?12/29/21 120 lb (54.4 kg)  ?11/09/21 123 lb 3.2 oz (55.9 kg)  ?10/10/21 122 lb (55.3 kg)  ? ? ?General appearance: alert, cooperative and appears stated age ?Ears: normal TM's and external ear canals both ears ?Throat: lips, mucosa, and tongue normal; teeth and gums normal ?Neck: no adenopathy, no carotid bruit, supple, symmetrical, trachea midline and thyroid not enlarged, symmetric, no tenderness/mass/nodules ?Back: symmetric, no curvature. ROM normal. No CVA tenderness. ?Lungs: clear to auscultation bilaterally ?Heart: regular rate and rhythm, S1, S2 normal, no murmur, click, rub or gallop ?Abdomen: soft, non-tender; bowel sounds normal; no masses,  no organomegaly ?Pulses: 2+ and symmetric ?Skin: Skin color, texture, turgor normal. No rashes or lesions ?Lymph nodes: Cervical, supraclavicular, and axillary nodes normal. ? ?No results found for: HGBA1C ? ?Lab Results  ?Component Value Date  ? CREATININE 0.97 12/29/2021  ? CREATININE 0.88 10/14/2021  ? CREATININE 0.87 07/01/2021  ? ? ?Lab Results  ?Component Value Date  ? WBC 6.4 12/29/2021  ? HGB 11.1 (L) 12/29/2021  ? HCT 33.3 (L) 12/29/2021  ? PLT 125.0 (L) 12/29/2021  ? GLUCOSE 81 12/29/2021  ? CHOL 182 12/29/2020  ? TRIG 77.0 12/29/2020  ? HDL 68.00 12/29/2020  ? LDLDIRECT 76.0 08/31/2016  ? [place] 98 12/29/2020  ? ALT 11 12/29/2021  ? AST 17 12/29/2021  ? NA 137 12/29/2021  ? K 4.3 12/29/2021  ? CL 104 12/29/2021  ? CREATININE 0.97 12/29/2021  ? BUN 21 12/29/2021  ? CO2 26 12/29/2021  ? TSH 1.87 12/29/2021  ? MICROALBUR <0.7 10/16/2017  ? ? ?CT HEAD WO CONTRAST (5MM) ? ?Result Date: 10/15/2021 ?CLINICAL DATA:  Evaluate for subarachnoid hemorrhage. History of hypertension increasing recently. EXAM: CT HEAD WITHOUT CONTRAST TECHNIQUE: Contiguous axial images were obtained from the base of the skull through the vertex without intravenous contrast. RADIATION DOSE REDUCTION: This exam was performed according to the departmental  dose-optimization program which includes automated exposure control, adjustment of the mA and/or kV according to patient size and/or use of iterative reconstruction technique. COMPARISON:  CT 11/11/2017. FINDINGS: Brain: No evidence of acute infarction, hemorrhage, hydrocephalus, extra-axial collection or mass lesion/mass effect. There is mild cerebral atrophy and small-vessel disease. Cerebellum and brainstem are unremarkable. There is no midline shift or basal cisternal effacement. Vascular: There are calcifications in the carotid siphons but no hyperdense central vessels. Scattered calcification left vertebral artery. Skull: Normal. Negative for fracture or focal lesion. Sinuses/Orbits: Old left lens extraction/replacement. 1.5 cm retention cyst right maxillary sinus floor. Chronic opacification right sphenoid sinus with hyperostosis. Other sinuses and mastoid air cells are clear. Other: None. IMPRESSION: 1. No acute intracranial CT findings or interval changes. 2. Chronic opacification of the right sphenoid sinus. Electronically Signed   By: Telford Nab M.D.   On:  10/15/2021 06:00  ? ? ?Assessment & Plan:  ? ?Problem List Items Addressed This Visit   ? ? Paroxysmal A-fib ([place])  ?  She remains in sinus rhythm based on today's exam.  ?  ?  ? Relevant Medications  ? cloNIDine (CATAPRES) 0.1 MG tablet  ? Hypothyroidism  ?  Thyroid function is normal on current dose of levothyroxine 75 mg daily . No changes today ? ? ?Lab Results  ?Component Value Date  ? TSH 1.87 12/29/2021  ? ? ?  ?  ? Relevant Orders  ? TSH (Completed)  ? Cough in adult  ?  residual from recent viral URI  Vs allergy attack which occurred after working in her garden .  Exam is normal. Recommend use of antihistamind bid and elevation of pillow/torso when sleeping  ?  ?  ? Benign essential hypertension - Primary  ?  Home readings are in normal range on current regimen of clonidine 0.1 mg qam , 0.2 mg qpm,  amldoipine 5 mg daily,  Losartan 100 mg  daily.  No changes today  ?  ?  ? Relevant Medications  ? cloNIDine (CATAPRES) 0.1 MG tablet  ? Other Relevant Orders  ? Comp Met (CMET) (Completed)  ? Aortic atherosclerosis ([place])  ?  Reviewed findings of prior CT scan

## 2021-12-29 NOTE — Progress Notes (Signed)
This patient returns to my office for at risk foot care.  This patient requires this care by a professional since this patient will be at risk due to having venous insufficiency and PVD.    This patient is unable to cut nails herself since the patient cannot reach her nails.These nails are painful walking and wearing shoes.    Patient says she has corns on her fourth toes both feet.  This patient presents for at risk foot care today. ? ?General Appearance  Alert, conversant and in no acute stress. ? ?Vascular  Dorsalis pedis pulses are  Weakly palpable  B/L.  Posterior tibial pulses are absent  B/L.  bilaterally.  Capillary return is within normal limits  bilaterally. Cold feet bilaterally.Absent digital hair. ? ?Neurologic  Senn-Weinstein monofilament wire test within normal limits  bilaterally. Muscle power within normal limits bilaterally. ? ?Nails Thick disfigured discolored nails with subungual debris  from hallux to fifth toes bilaterally. No evidence of bacterial infection or drainage bilaterally. ? ?Orthopedic  No limitations of motion  feet .  No crepitus or effusions noted.  No bony pathology or digital deformities noted.HT 2-4  B/L.  Adductovarus fifth toes  B/L  Pain noted upon palpation inside border fifth toe right.  No corn noted. ? ?Skin  normotropic skin with no porokeratosis noted bilaterally.  No signs of infections or ulcers noted.    ? ?Onychomycosis  Pain in right toes  Pain in left toes ? ?Consent was obtained for treatment procedures.   Mechanical debridement of nails 1-5  bilaterally performed with a nail nipper.  Filed with dremel without incident. Padding dispensed to pad and separate 4/5 toes  B/L. ? ? ?Return office visit   4  months                  Told patient to return for periodic foot care and evaluation due to potential at risk complications. ? ? ?Gardiner Barefoot DPM  ?

## 2021-12-30 DIAGNOSIS — R059 Cough, unspecified: Secondary | ICD-10-CM | POA: Insufficient documentation

## 2021-12-30 NOTE — Assessment & Plan Note (Signed)
residual from recent viral URI  Vs allergy attack which occurred after working in her garden .  Exam is normal. Recommend use of antihistamind bid and elevation of pillow/torso when sleeping  ?

## 2021-12-30 NOTE — Assessment & Plan Note (Signed)
Home readings are in normal range on current regimen of clonidine 0.1 mg qam , 0.2 mg qpm,  amldoipine 5 mg daily,  Losartan 100 mg daily.  No changes today  ?

## 2021-12-30 NOTE — Assessment & Plan Note (Signed)
Thyroid function is normal on current dose of levothyroxine 75 mg daily . No changes today ? ? ?Lab Results  ?Component Value Date  ? TSH 1.87 12/29/2021  ? ? ?

## 2021-12-30 NOTE — Assessment & Plan Note (Addendum)
Reviewed findings of prior CT scan today..  Patient is tolerating high potency statin therapy with crestor 10 mg daily  ?

## 2021-12-30 NOTE — Assessment & Plan Note (Signed)
She remains in sinus rhythm based on today's exam.  ?

## 2022-01-01 NOTE — Addendum Note (Signed)
Addended by: Crecencio Mc on: 01/01/2022 10:09 PM ? ? Modules accepted: Orders ? ?

## 2022-01-05 ENCOUNTER — Other Ambulatory Visit (INDEPENDENT_AMBULATORY_CARE_PROVIDER_SITE_OTHER): Payer: Medicare PPO

## 2022-01-05 ENCOUNTER — Telehealth: Payer: Self-pay

## 2022-01-05 DIAGNOSIS — E538 Deficiency of other specified B group vitamins: Secondary | ICD-10-CM | POA: Diagnosis not present

## 2022-01-05 DIAGNOSIS — D649 Anemia, unspecified: Secondary | ICD-10-CM

## 2022-01-05 LAB — B12 AND FOLATE PANEL
Folate: 15.2 ng/mL (ref >5.9)
Vitamin B-12: 122 pg/mL — ABNORMAL LOW (ref 211–911)

## 2022-01-05 LAB — IBC + FERRITIN
Ferritin: 37.5 ng/mL (ref 10.0–291.0)
Iron: 117 ug/dL (ref 42–145)
Saturation Ratios: 37.3 % (ref 20.0–50.0)
TIBC: 313.6 ug/dL (ref 250.0–450.0)
Transferrin: 224 mg/dL (ref 212.0–360.0)

## 2022-01-05 NOTE — Telephone Encounter (Signed)
-----   Message from Crecencio Mc, MD sent at 01/01/2022 10:07 PM EDT ----- ?Your labs indicate that you have a mild anemia.  The cause is unclear.  Please  schedule a lab visit for additional labs and a follow up appt in a week  ?

## 2022-01-05 NOTE — Telephone Encounter (Signed)
Unable to schedule  ?Deleting recall  ?

## 2022-01-05 NOTE — Telephone Encounter (Signed)
LMTCB. Pt has already had the additional labs but needs to schedule a one week follow up with Dr. Derrel Nip when she returns the call.  ?

## 2022-01-05 NOTE — Telephone Encounter (Signed)
Noted thank you

## 2022-01-05 NOTE — Telephone Encounter (Signed)
Patient called office back and follow up has been scheduled. ?

## 2022-01-08 NOTE — Addendum Note (Signed)
Addended by: Crecencio Mc on: 01/08/2022 11:19 PM ? ? Modules accepted: Orders ? ?

## 2022-01-09 LAB — PROTEIN ELECTROPHORESIS, SERUM
Albumin ELP: 3.7 g/dL — ABNORMAL LOW (ref 3.8–4.8)
Alpha 1: 0.3 g/dL (ref 0.2–0.3)
Alpha 2: 0.8 g/dL (ref 0.5–0.9)
Beta 2: 0.2 g/dL (ref 0.2–0.5)
Beta Globulin: 0.5 g/dL (ref 0.4–0.6)
Gamma Globulin: 0.5 g/dL — ABNORMAL LOW (ref 0.8–1.7)
Total Protein: 6 g/dL — ABNORMAL LOW (ref 6.1–8.1)

## 2022-01-10 ENCOUNTER — Other Ambulatory Visit: Payer: Self-pay | Admitting: Cardiovascular Disease

## 2022-01-10 LAB — IMMUNOFIXATION INTE

## 2022-01-13 ENCOUNTER — Ambulatory Visit (INDEPENDENT_AMBULATORY_CARE_PROVIDER_SITE_OTHER): Payer: Medicare PPO

## 2022-01-13 DIAGNOSIS — E538 Deficiency of other specified B group vitamins: Secondary | ICD-10-CM | POA: Diagnosis not present

## 2022-01-13 DIAGNOSIS — D519 Vitamin B12 deficiency anemia, unspecified: Secondary | ICD-10-CM

## 2022-01-13 MED ORDER — CYANOCOBALAMIN 1000 MCG/ML IJ SOLN
1000.0000 ug | Freq: Once | INTRAMUSCULAR | Status: AC
  Administered 2022-01-13: 1000 ug via INTRAMUSCULAR

## 2022-01-13 NOTE — Progress Notes (Signed)
Patient presented for B 12 injection to left deltoid, patient voiced no concerns nor showed any signs of distress during injection. 

## 2022-01-20 ENCOUNTER — Ambulatory Visit: Payer: Medicare PPO | Admitting: Internal Medicine

## 2022-01-20 ENCOUNTER — Encounter: Payer: Self-pay | Admitting: Internal Medicine

## 2022-01-20 VITALS — BP 156/76 | HR 71 | Temp 97.9°F | Ht 62.0 in | Wt 119.8 lb

## 2022-01-20 DIAGNOSIS — S60559A Superficial foreign body of unspecified hand, initial encounter: Secondary | ICD-10-CM

## 2022-01-20 DIAGNOSIS — E538 Deficiency of other specified B group vitamins: Secondary | ICD-10-CM | POA: Diagnosis not present

## 2022-01-20 DIAGNOSIS — I10 Essential (primary) hypertension: Secondary | ICD-10-CM

## 2022-01-20 DIAGNOSIS — D513 Other dietary vitamin B12 deficiency anemia: Secondary | ICD-10-CM | POA: Diagnosis not present

## 2022-01-20 DIAGNOSIS — S60551A Superficial foreign body of right hand, initial encounter: Secondary | ICD-10-CM

## 2022-01-20 LAB — INTRINSIC FACTOR ANTIBODIES: Intrinsic Factor: NEGATIVE

## 2022-01-20 MED ORDER — CYANOCOBALAMIN 1000 MCG/ML IJ SOLN
1000.0000 ug | Freq: Once | INTRAMUSCULAR | Status: AC
  Administered 2022-01-20: 1000 ug via INTRAMUSCULAR

## 2022-01-20 NOTE — Progress Notes (Signed)
? ?Subjective:  ?[name], female    DOB: Mar 01, 1925  Age: 88 y.o. MRN: [id number] ? ?CC: The primary encounter diagnosis was B12 deficiency. Diagnoses of Benign essential hypertension, Wood splinter in palm of hand, and Other dietary vitamin B12 deficiency anemia were also pertinent to this visit. ? ? ?This visit occurred during the SARS-CoV-2 public health emergency.  Safety protocols were in place, including screening questions prior to the visit, additional usage of staff PPE, and extensive cleaning of exam room while observing appropriate contact time as indicated for disinfecting solutions.   ? ?HPI ?Deanna White presents for follow up on hypertension , hypothyroidism, aortic atherosclerosis and PAF ?Chief Complaint  ?Patient presents with  ? Follow-up  ?  Follow up   ? ?1) HTN:  seen 2 weeks ago at which time she was  following a reduce salt diet most days and  taking  clonidine 0.1 mg in am  20.2 qpm .  She  has reduced dose of  amlodipine 5 mg qhs,  taking  losartan 100 mg daily and furosemide 20 mg daily with good tolerance. ? ? ?2)  right hand splinter In her palm , has been soaking it daily and applying neosporin .   ? ?3)  B12 deficiency noted with last lab  ? ?4) having a root canal. Not sure if plavix needs to be stopped  ? ?Outpatient Medications Prior to Visit  ?Medication Sig Dispense Refill  ? amLODipine (NORVASC) 5 MG tablet Take 1 tablet (5 mg total) by mouth 2 (two) times daily. (Patient taking differently: Take 5 mg by mouth daily. Once daily) 180 tablet 1  ? aspirin EC 81 MG tablet Take 81 mg by mouth at bedtime.    ? cloNIDine (CATAPRES) 0.1 MG tablet 1 tablet in the am and 2 in the evening 270 tablet 2  ? clopidogrel (PLAVIX) 75 MG tablet TAKE 1 TABLET BY MOUTH EVERY DAY 90 tablet 1  ? dorzolamide (TRUSOPT) 2 % ophthalmic solution Place 1 drop into both eyes 3 (three) times daily.   12  ? furosemide (LASIX) 20 MG tablet Take 1 tablet (20 mg total) by mouth every other day.  As needed for swellling 45 tablet 3  ? ketoconazole (NIZORAL) 2 % cream Apply 1 application topically at bedtime. 60 g 11  ? levothyroxine (SYNTHROID) 75 MCG tablet TAKE 1 TABLET BY MOUTH EVERY DAY 90 tablet 1  ? losartan (COZAAR) 100 MG tablet TAKE 1 TABLET BY MOUTH EVERY DAY 90 tablet 1  ? Multiple Vitamins-Minerals (PRESERVISION AREDS 2) CAPS Take 1 capsule by mouth 2 (two) times daily.     ? rosuvastatin (CRESTOR) 10 MG tablet Take 1 tablet (10 mg total) by mouth daily. (Patient not taking: Reported on 01/20/2022) 90 tablet 3  ? ?No facility-administered medications prior to visit.  ? ? ?Review of Systems; ? ?Patient denies headache, fevers, malaise, unintentional weight loss, skin rash, eye pain, sinus congestion and sinus pain, sore throat, dysphagia,  hemoptysis , cough, dyspnea, wheezing, chest pain, palpitations, orthopnea, edema, abdominal pain, nausea, melena, diarrhea, constipation, flank pain, dysuria, hematuria, urinary  Frequency, nocturia, numbness, tingling, seizures,  Focal weakness, Loss of consciousness,  Tremor, insomnia, depression, anxiety, and suicidal ideation.   ? ? ? ?Objective:  ?BP (!) 156/76 (BP Location: Left Arm, Patient Position: Sitting, Cuff Size: Normal)   Pulse 71   Temp 97.9 ?F (36.6 ?C) (Oral)   Ht '5\' 2"'$  (1.575 m)   Wt 119  lb 12.8 oz (54.3 kg)   SpO2 96%   BMI 21.91 kg/m?  ? ?BP Readings from Last 3 Encounters:  ?01/20/22 (!) 156/76  ?12/29/21 132/80  ?11/09/21 (!) 146/62  ? ? ?Wt Readings from Last 3 Encounters:  ?01/20/22 119 lb 12.8 oz (54.3 kg)  ?12/29/21 120 lb (54.4 kg)  ?11/09/21 123 lb 3.2 oz (55.9 kg)  ? ? ?General appearance: alert, cooperative and appears stated age ?Ears: normal TM's and external ear canals both ears ?Throat: lips, mucosa, and tongue normal; teeth and gums normal ?Neck: no adenopathy, no carotid bruit, supple, symmetrical, trachea midline and thyroid not enlarged, symmetric, no tenderness/mass/nodules ?Back: symmetric, no curvature. ROM normal.  No CVA tenderness. ?Lungs: clear to auscultation bilaterally ?Heart: regular rate and rhythm, S1, S2 normal, no murmur, click, rub or gallop ?Abdomen: soft, non-tender; bowel sounds normal; no masses,  no organomegaly ?Pulses: 2+ and symmetric ?Skin: right palm without erythema and foreign body .  Skin color, texture, turgor normal. No rashes or lesions ?Lymph nodes: Cervical, supraclavicular, and axillary nodes normal. ? ?No results found for: HGBA1C ? ?Lab Results  ?Component Value Date  ? CREATININE 0.97 12/29/2021  ? CREATININE 0.88 10/14/2021  ? CREATININE 0.87 07/01/2021  ? ? ?Lab Results  ?Component Value Date  ? WBC 6.4 12/29/2021  ? HGB 11.1 (L) 12/29/2021  ? HCT 33.3 (L) 12/29/2021  ? PLT 125.0 (L) 12/29/2021  ? GLUCOSE 81 12/29/2021  ? CHOL 182 12/29/2020  ? TRIG 77.0 12/29/2020  ? HDL 68.00 12/29/2020  ? LDLDIRECT 76.0 08/31/2016  ? [place] 98 12/29/2020  ? ALT 11 12/29/2021  ? AST 17 12/29/2021  ? NA 137 12/29/2021  ? K 4.3 12/29/2021  ? CL 104 12/29/2021  ? CREATININE 0.97 12/29/2021  ? BUN 21 12/29/2021  ? CO2 26 12/29/2021  ? TSH 1.87 12/29/2021  ? MICROALBUR <0.7 10/16/2017  ? ? ?CT HEAD WO CONTRAST (5MM) ? ?Result Date: 10/15/2021 ?CLINICAL DATA:  Evaluate for subarachnoid hemorrhage. History of hypertension increasing recently. EXAM: CT HEAD WITHOUT CONTRAST TECHNIQUE: Contiguous axial images were obtained from the base of the skull through the vertex without intravenous contrast. RADIATION DOSE REDUCTION: This exam was performed according to the departmental dose-optimization program which includes automated exposure control, adjustment of the mA and/or kV according to patient size and/or use of iterative reconstruction technique. COMPARISON:  CT 11/11/2017. FINDINGS: Brain: No evidence of acute infarction, hemorrhage, hydrocephalus, extra-axial collection or mass lesion/mass effect. There is mild cerebral atrophy and small-vessel disease. Cerebellum and brainstem are unremarkable. There is no  midline shift or basal cisternal effacement. Vascular: There are calcifications in the carotid siphons but no hyperdense central vessels. Scattered calcification left vertebral artery. Skull: Normal. Negative for fracture or focal lesion. Sinuses/Orbits: Old left lens extraction/replacement. 1.5 cm retention cyst right maxillary sinus floor. Chronic opacification right sphenoid sinus with hyperostosis. Other sinuses and mastoid air cells are clear. Other: None. IMPRESSION: 1. No acute intracranial CT findings or interval changes. 2. Chronic opacification of the right sphenoid sinus. Electronically Signed   By: Telford Nab M.D.   On: 10/15/2021 06:00  ? ? ?Assessment & Plan:  ? ?Problem List Items Addressed This Visit   ? ? B12 deficiency anemia  ?  Intrinsic factor antibody negative.  Will recommend continued b12 supplementation ? ?  ?  ? B12 deficiency - Primary  ? Relevant Orders  ? Intrinsic Factor Antibodies  ? Benign essential hypertension  ?  Home readings are in normal range  on current regimen of clonidine 0.1 mg qam , 0.2 mg qpm,  amldoipine 5 mg daily,  Losartan 100 mg daily.  No changes today  ? ?  ?  ? Wood splinter in palm of hand  ?  Now resolved with  A week of soaks.  There is good granulation tissue in the bed,  Advised to stop using neosporin and stop soaking  ? ?  ?  ? ? ?Follow-up: No follow-ups on file. ? ? ?Crecencio Mc, MD ?

## 2022-01-20 NOTE — Patient Instructions (Signed)
You can stop soaking your wound and stop using neosporin ? ?Keep it covered and dry.  Change your bandaid when moist ? ? ?

## 2022-01-22 DIAGNOSIS — E538 Deficiency of other specified B group vitamins: Secondary | ICD-10-CM | POA: Insufficient documentation

## 2022-01-22 DIAGNOSIS — S60559A Superficial foreign body of unspecified hand, initial encounter: Secondary | ICD-10-CM | POA: Insufficient documentation

## 2022-01-22 NOTE — Assessment & Plan Note (Signed)
Intrinsic factor antibody negative.  Will recommend continued b12 supplementation ?

## 2022-01-22 NOTE — Assessment & Plan Note (Signed)
Home readings are in normal range on current regimen of clonidine 0.1 mg qam , 0.2 mg qpm,  amldoipine 5 mg daily,  Losartan 100 mg daily.  No changes today  ?

## 2022-01-22 NOTE — Assessment & Plan Note (Signed)
Now resolved with  A week of soaks.  There is good granulation tissue in the bed,  Advised to stop using neosporin and stop soaking  ?

## 2022-01-23 ENCOUNTER — Other Ambulatory Visit (INDEPENDENT_AMBULATORY_CARE_PROVIDER_SITE_OTHER): Payer: Self-pay | Admitting: Nurse Practitioner

## 2022-01-24 ENCOUNTER — Other Ambulatory Visit: Payer: Self-pay | Admitting: Internal Medicine

## 2022-01-27 ENCOUNTER — Ambulatory Visit (INDEPENDENT_AMBULATORY_CARE_PROVIDER_SITE_OTHER): Payer: Medicare PPO

## 2022-01-27 DIAGNOSIS — E538 Deficiency of other specified B group vitamins: Secondary | ICD-10-CM | POA: Diagnosis not present

## 2022-01-27 MED ORDER — CYANOCOBALAMIN 1000 MCG/ML IJ SOLN
1000.0000 ug | Freq: Once | INTRAMUSCULAR | Status: AC
  Administered 2022-01-27: 1000 ug via INTRAMUSCULAR

## 2022-01-27 NOTE — Progress Notes (Signed)
Patient presented for B 12 injection to right deltoid, patient voiced no concerns nor showed any signs of distress during injection. 

## 2022-02-03 ENCOUNTER — Ambulatory Visit: Payer: Medicare PPO

## 2022-02-06 ENCOUNTER — Ambulatory Visit (INDEPENDENT_AMBULATORY_CARE_PROVIDER_SITE_OTHER): Payer: Medicare PPO

## 2022-02-06 DIAGNOSIS — E538 Deficiency of other specified B group vitamins: Secondary | ICD-10-CM

## 2022-02-06 MED ORDER — CYANOCOBALAMIN 1000 MCG/ML IJ SOLN
1000.0000 ug | Freq: Once | INTRAMUSCULAR | Status: AC
  Administered 2022-02-06: 1000 ug via INTRAMUSCULAR

## 2022-02-06 NOTE — Progress Notes (Signed)
Patient presented for B 12 injection to left deltoid, patient voiced no concerns nor showed any signs of distress during injection. 

## 2022-02-09 LAB — INTRINSIC FACTOR ANTIBODIES: Intrinsic Factor: POSITIVE — AB

## 2022-02-13 ENCOUNTER — Telehealth: Payer: Self-pay | Admitting: Internal Medicine

## 2022-02-13 MED ORDER — CYANOCOBALAMIN 1000 MCG/ML IJ SOLN: 1000.0000 ug | INTRAMUSCULAR | 0 refills | Status: DC

## 2022-02-13 MED ORDER — "SYRINGE 25G X 1"" 3 ML MISC": 0 refills | Status: DC

## 2022-02-13 MED ORDER — "NEEDLE (DISP) 25G X 1"" MISC": 0 refills | Status: DC

## 2022-02-13 NOTE — Telephone Encounter (Signed)
Pt called stating that she have received her result about her B12... Pt was wondering how many times do she needs to take the B12 Injection... Pt was wondering if Dr. Derrel Nip can write a script for the B12 so her niece can give her the injections... Pt requesting callback  ?

## 2022-02-13 NOTE — Telephone Encounter (Signed)
Spoke with pt to let her know that the medication has been sent in and that she will need to do it once monthly for the rest of her life. Pt gave a verbal understanding.  ?

## 2022-03-30 IMAGING — DX DG ANKLE COMPLETE 3+V*L*
3 series · 3 of 3 positions shown · non-contrast
Comparison: 03/26/2019

CLINICAL DATA: Left ankle pain

EXAM:
LEFT ANKLE COMPLETE - 3+ VIEW

[ankle ap]
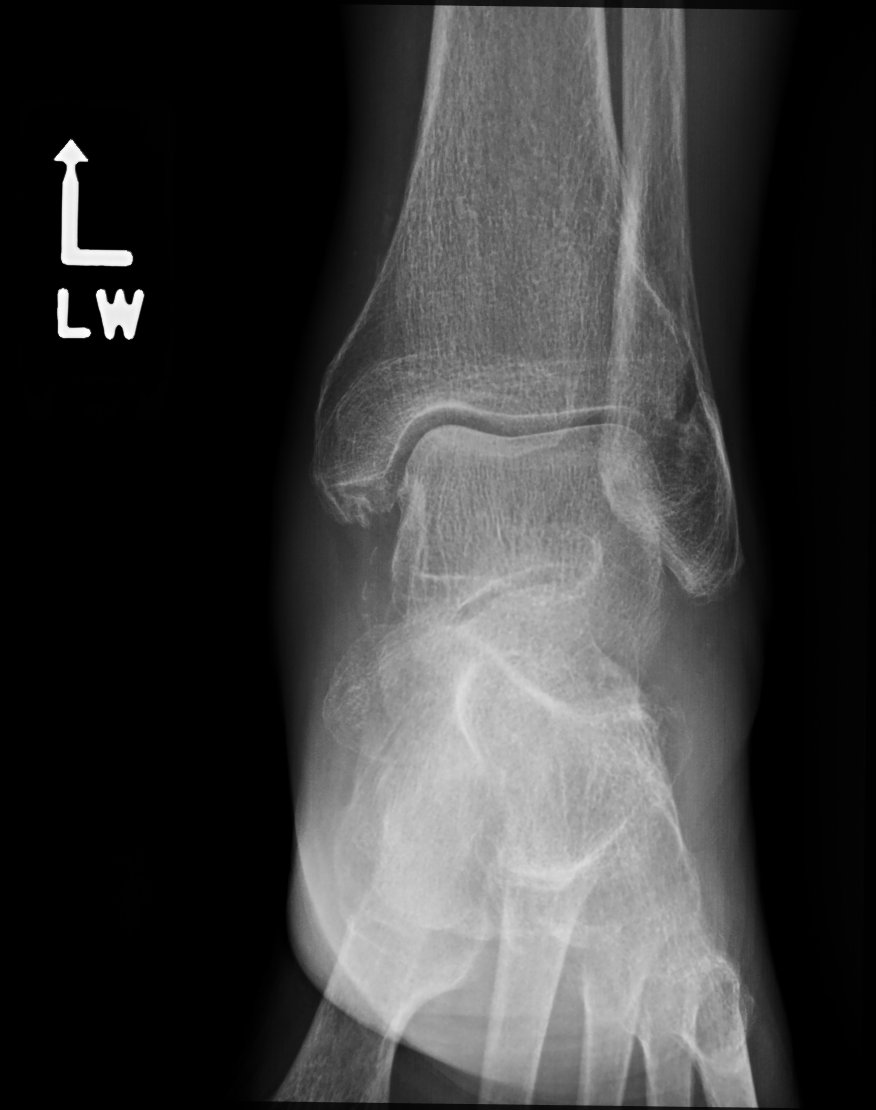

[ankle obl (oblique)]
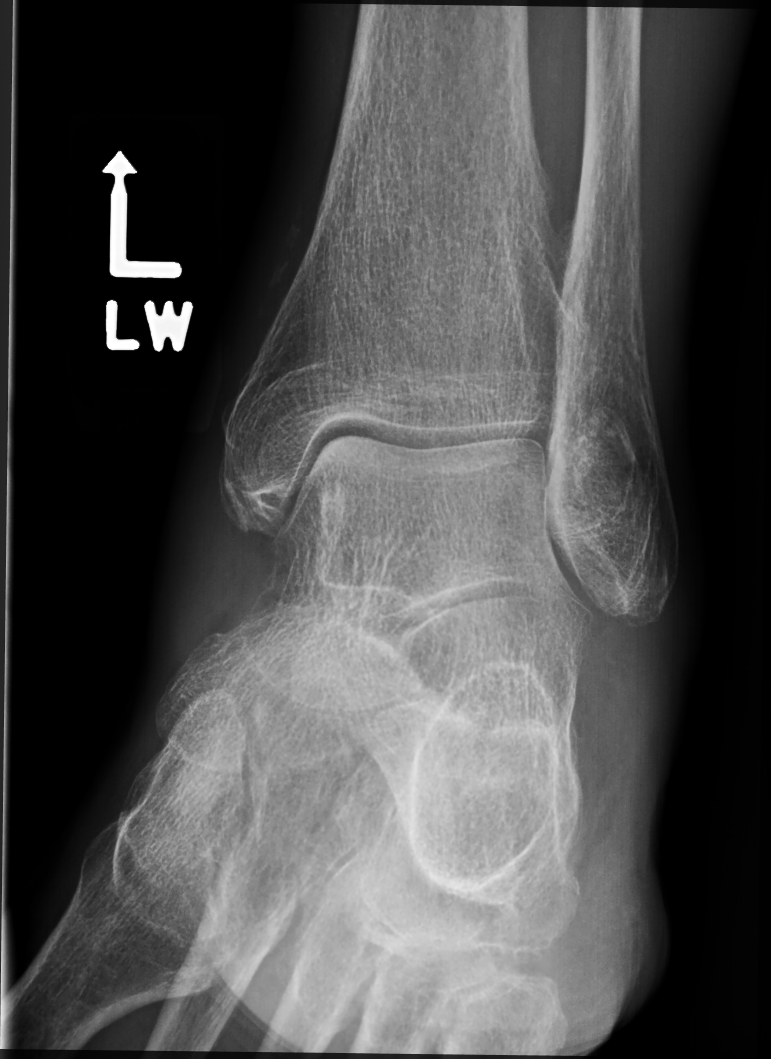

[ankle lat]
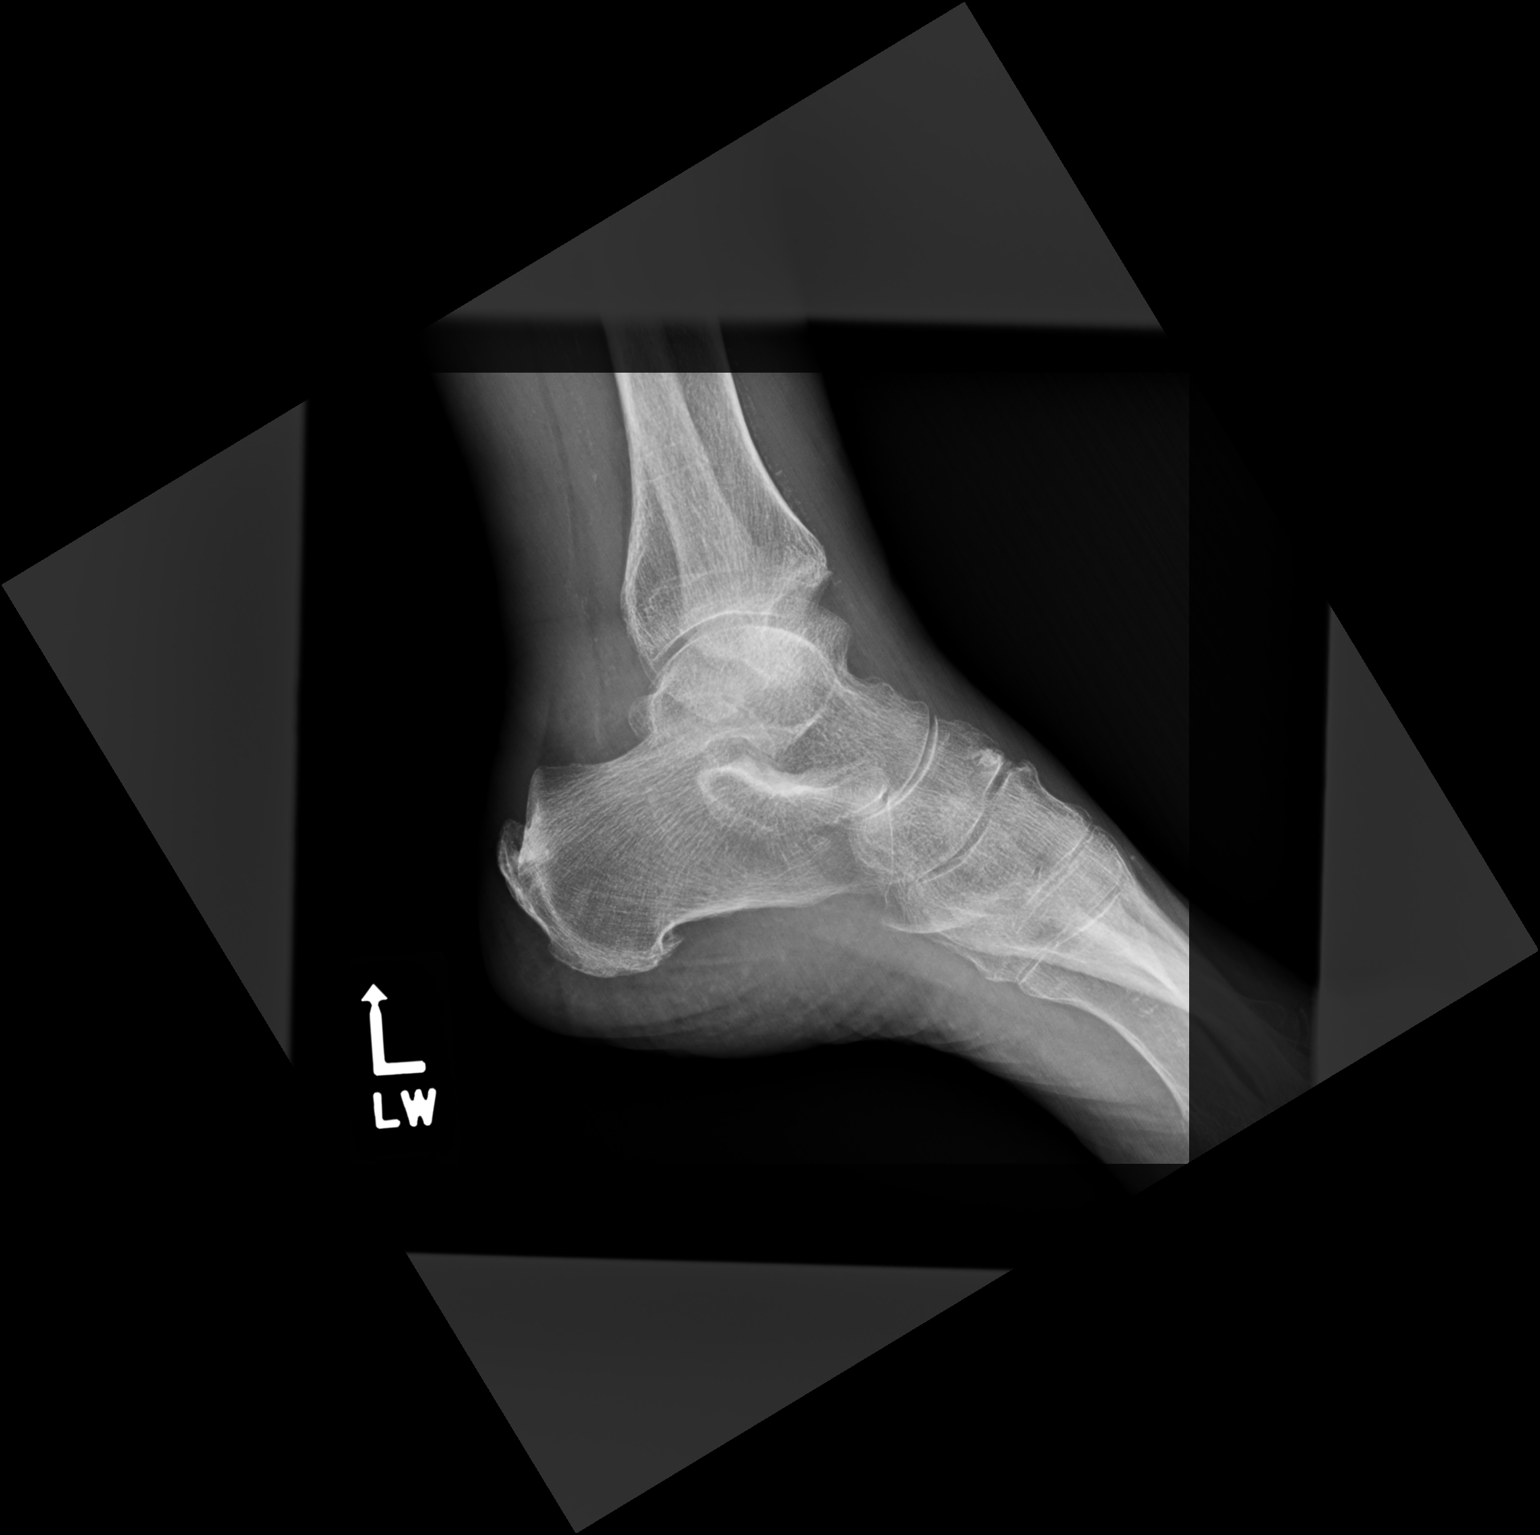

[3 of 3 positions shown; findings below may reference images not displayed]

FINDINGS: Three view radiograph left ankle demonstrates normal alignment. No
fracture or dislocation. No ankle effusion. Small superior and
plantar calcaneal spurs. Vascular calcifications are seen. Mild
bimalleolar soft tissue swelling.
IMPRESSION: Mild soft tissue swelling.  No fracture or dislocation.

## 2022-04-15 IMAGING — CR DG CHEST 2V
2 series · 2 of 2 positions shown · non-contrast
Comparison: Chest radiograph dated 09/27/2018

CLINICAL DATA: [AGE] female with shortness of breath

EXAM:
CHEST - 2 VIEW

[chest pa]
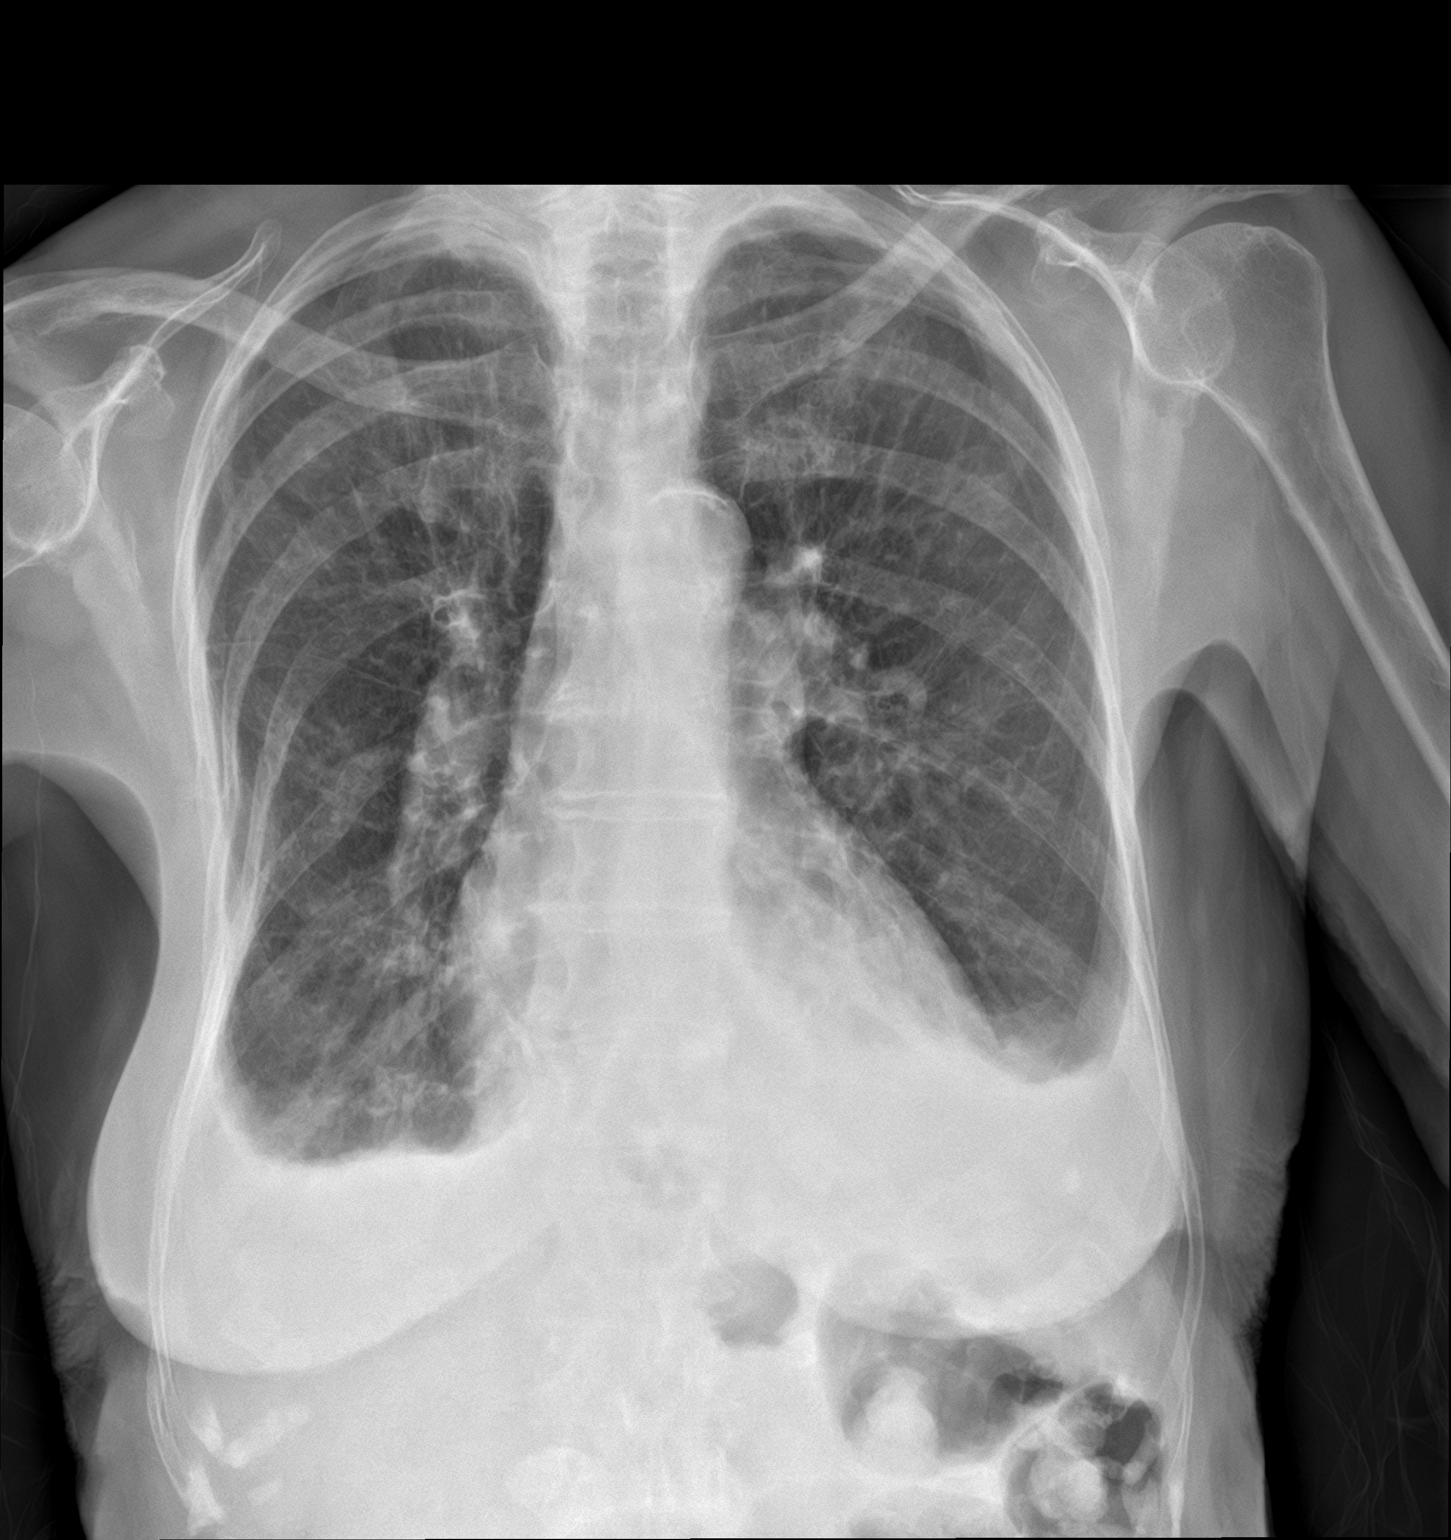

[chest lat]
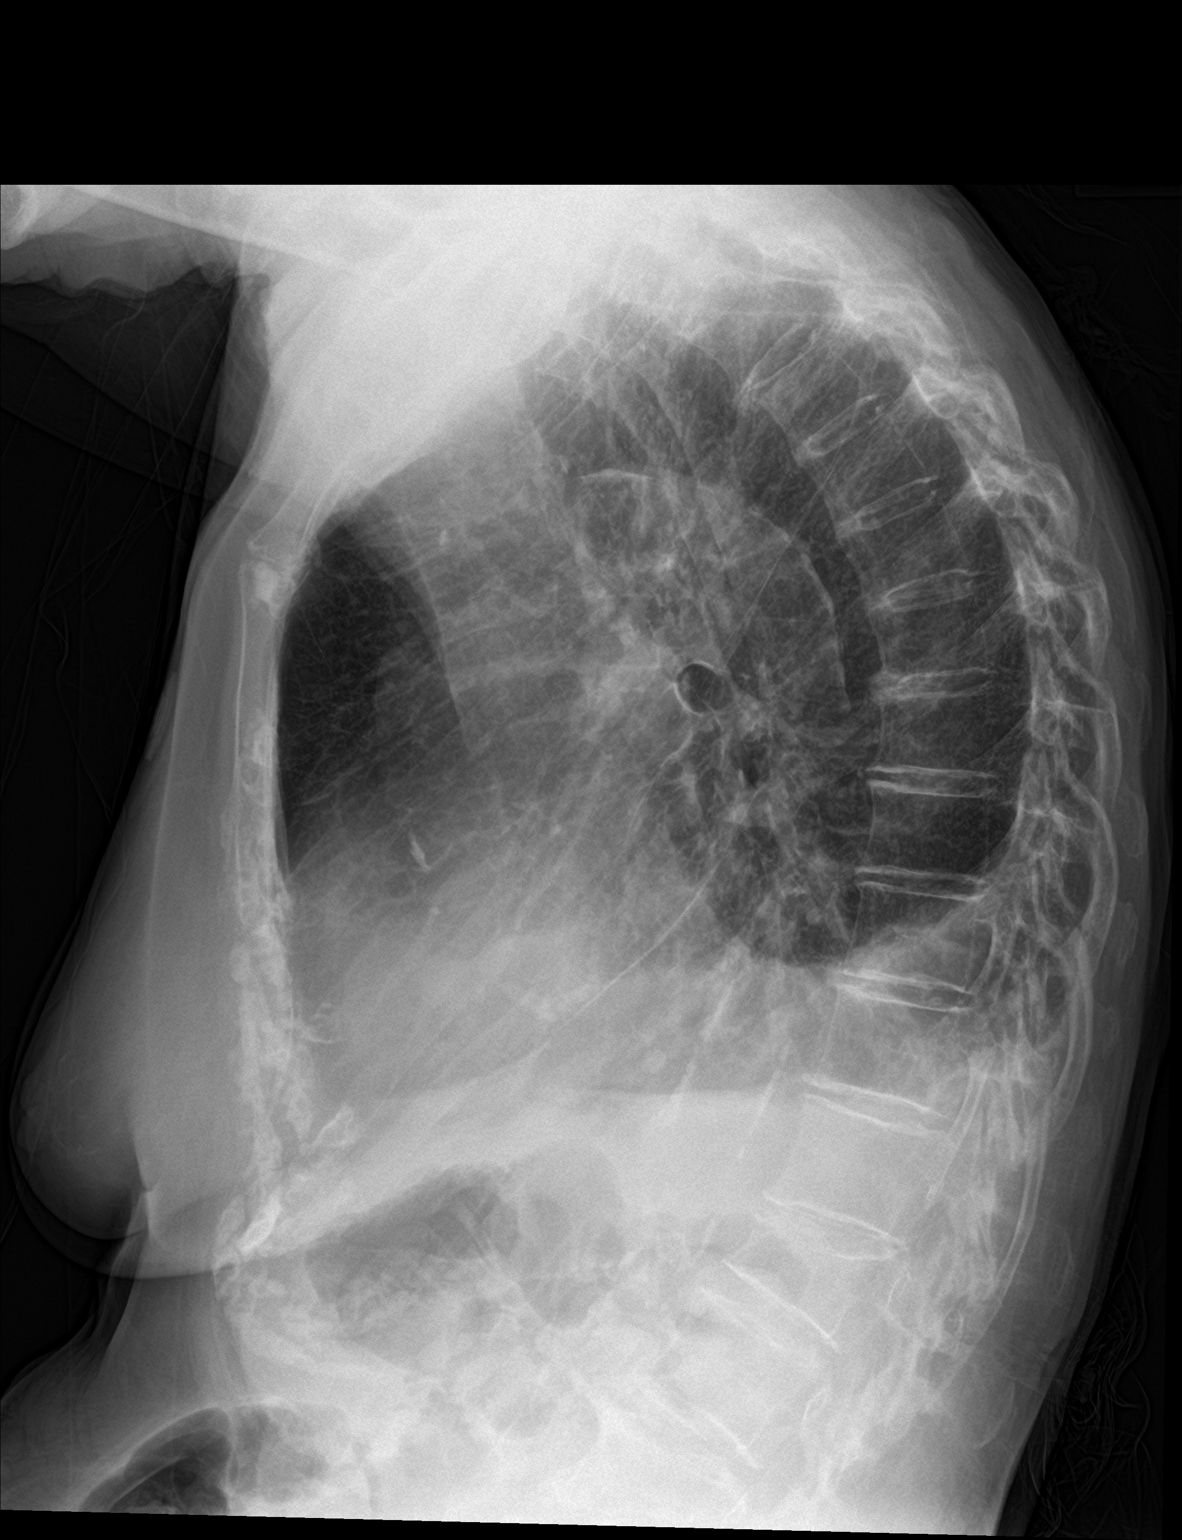

[2 of 2 positions shown; findings below may reference images not displayed]

FINDINGS: There are small bilateral pleural effusions with bibasilar
atelectasis or infiltrate. Overall increase in the degree of pleural
effusions and associated atelectasis compared to the prior
radiograph. There is background of emphysema. No pneumothorax.
Stable cardiac silhouette. Atherosclerotic calcification of the
aorta. No acute osseous pathology. Osteopenia. Old lower thoracic
compression fracture.
IMPRESSION: Small bilateral pleural effusions with bibasilar atelectasis or
infiltrate.

## 2022-04-27 ENCOUNTER — Ambulatory Visit: Payer: Medicare PPO | Admitting: Podiatry

## 2022-05-15 ENCOUNTER — Ambulatory Visit: Payer: Medicare PPO | Admitting: Podiatry

## 2022-05-15 ENCOUNTER — Encounter: Payer: Self-pay | Admitting: Podiatry

## 2022-05-15 DIAGNOSIS — B351 Tinea unguium: Secondary | ICD-10-CM | POA: Diagnosis not present

## 2022-05-15 DIAGNOSIS — M79674 Pain in right toe(s): Secondary | ICD-10-CM

## 2022-05-15 DIAGNOSIS — M79675 Pain in left toe(s): Secondary | ICD-10-CM

## 2022-05-15 DIAGNOSIS — I872 Venous insufficiency (chronic) (peripheral): Secondary | ICD-10-CM

## 2022-05-15 NOTE — Progress Notes (Signed)
This patient returns to my office for at risk foot care.  This patient requires this care by a professional since this patient will be at risk due to having venous insufficiency and PVD.    This patient is unable to cut nails herself since the patient cannot reach her nails.These nails are painful walking and wearing shoes.    Patient says she has corns on her fourth toes both feet.  This patient presents for at risk foot care today.  General Appearance  Alert, conversant and in no acute stress.  Vascular  Dorsalis pedis pulses are  Weakly palpable  B/L.  Posterior tibial pulses are absent  B/L.  bilaterally.  Capillary return is within normal limits  bilaterally. Cold feet bilaterally.Absent digital hair.  Neurologic  Senn-Weinstein monofilament wire test within normal limits  bilaterally. Muscle power within normal limits bilaterally.  Nails Thick disfigured discolored nails with subungual debris  from hallux to fifth toes bilaterally. No evidence of bacterial infection or drainage bilaterally.  Orthopedic  No limitations of motion  feet .  No crepitus or effusions noted.  No bony pathology or digital deformities noted.HT 2-4  B/L.  Adductovarus fifth toes  B/L  Pain noted upon palpation inside border fifth toe left   No corn noted.  Skin  normotropic skin with no porokeratosis noted bilaterally.  No signs of infections or ulcers noted.     Onychomycosis  Pain in right toes  Pain in left toes  Consent was obtained for treatment procedures.   Mechanical debridement of nails 1-5  bilaterally performed with a nail nipper.  Filed with dremel without incident. Padding dispensed to pad and separate 4/5 toes  B/L.   Return office visit  prn                Told patient to return for periodic foot care and evaluation due to potential at risk complications.   Gardiner Barefoot DPM

## 2022-06-01 ENCOUNTER — Ambulatory Visit: Payer: Medicare PPO | Admitting: Dermatology

## 2022-06-01 DIAGNOSIS — Z1283 Encounter for screening for malignant neoplasm of skin: Secondary | ICD-10-CM | POA: Diagnosis not present

## 2022-06-01 DIAGNOSIS — Z85828 Personal history of other malignant neoplasm of skin: Secondary | ICD-10-CM | POA: Diagnosis not present

## 2022-06-01 DIAGNOSIS — D1800 Hemangioma unspecified site: Secondary | ICD-10-CM

## 2022-06-01 DIAGNOSIS — D229 Melanocytic nevi, unspecified: Secondary | ICD-10-CM | POA: Diagnosis not present

## 2022-06-01 DIAGNOSIS — L814 Other melanin hyperpigmentation: Secondary | ICD-10-CM

## 2022-06-01 DIAGNOSIS — B353 Tinea pedis: Secondary | ICD-10-CM

## 2022-06-01 DIAGNOSIS — L821 Other seborrheic keratosis: Secondary | ICD-10-CM

## 2022-06-01 DIAGNOSIS — L578 Other skin changes due to chronic exposure to nonionizing radiation: Secondary | ICD-10-CM

## 2022-06-01 DIAGNOSIS — L820 Inflamed seborrheic keratosis: Secondary | ICD-10-CM

## 2022-06-01 MED ORDER — KETOCONAZOLE 2 % EX CREA: 1.0000 | TOPICAL_CREAM | Freq: Every day | CUTANEOUS | 11 refills | Status: AC

## 2022-06-01 NOTE — Patient Instructions (Signed)
Cryotherapy Aftercare  Wash gently with soap and water everyday.   Apply Vaseline and Band-Aid daily until healed.     Due to recent changes in healthcare laws, you may see results of your pathology and/or laboratory studies on MyChart before the doctors have had a chance to review them. We understand that in some cases there may be results that are confusing or concerning to you. Please understand that not all results are received at the same time and often the doctors may need to interpret multiple results in order to provide you with the best plan of care or course of treatment. Therefore, we ask that you please give us 2 business days to thoroughly review all your results before contacting the office for clarification. Should we see a critical lab result, you will be contacted sooner.   If You Need Anything After Your Visit  If you have any questions or concerns for your doctor, please call our main line at [phone] and press option 4 to reach your doctor's medical assistant. If no one answers, please leave a voicemail as directed and we will return your call as soon as possible. Messages left after 4 pm will be answered the following business day.   You may also send us a message via MyChart. We typically respond to MyChart messages within 1-2 business days.  For prescription refills, please ask your pharmacy to contact our office. Our fax number is [phone].  If you have an urgent issue when the clinic is closed that cannot wait until the next business day, you can page your doctor at the number below.    Please note that while we do our best to be available for urgent issues outside of office hours, we are not available 24/7.   If you have an urgent issue and are unable to reach us, you may choose to seek medical care at your doctor's office, retail clinic, urgent care center, or emergency room.  If you have a medical emergency, please immediately call 911 or go to the  emergency department.  Pager Numbers  - Dr. Kowalski: [phone]  - Dr. Moye: [phone]  - Dr. Stewart: [phone]  In the event of inclement weather, please call our main line at [phone] for an update on the status of any delays or closures.  Dermatology Medication Tips: Please keep the boxes that topical medications come in in order to help keep track of the instructions about where and how to use these. Pharmacies typically print the medication instructions only on the boxes and not directly on the medication tubes.   If your medication is too expensive, please contact our office at [phone] option 4 or send us a message through MyChart.   We are unable to tell what your co-pay for medications will be in advance as this is different depending on your insurance coverage. However, we may be able to find a substitute medication at lower cost or fill out paperwork to get insurance to cover a needed medication.   If a prior authorization is required to get your medication covered by your insurance company, please allow us 1-2 business days to complete this process.  Drug prices often vary depending on where the prescription is filled and some pharmacies may offer cheaper prices.  The website www.goodrx.com contains coupons for medications through different pharmacies. The prices here do not account for what the cost may be with help from insurance (it may be cheaper with your insurance), but the website can   give you the price if you did not use any insurance.  - You can print the associated coupon and take it with your prescription to the pharmacy.  - You may also stop by our office during regular business hours and pick up a GoodRx coupon card.  - If you need your prescription sent electronically to a different pharmacy, notify our office through [facility] [place] MyChart or by phone at [phone] option 4.     Si Usted Necesita Algo Despus de Su Visita  Tambin puede  enviarnos un mensaje a travs de MyChart. Por lo general respondemos a los mensajes de MyChart en el transcurso de 1 a 2 das hbiles.  Para renovar recetas, por favor pida a su farmacia que se ponga en contacto con nuestra oficina. Nuestro nmero de fax es el [phone].  Si tiene un asunto urgente cuando la clnica est cerrada y que no puede esperar hasta el siguiente da hbil, puede llamar/localizar a su doctor(a) al nmero que aparece a continuacin.   Por favor, tenga en cuenta que aunque hacemos todo lo posible para estar disponibles para asuntos urgentes fuera del horario de oficina, no estamos disponibles las 24 horas del da, los 7 das de la semana.   Si tiene un problema urgente y no puede comunicarse con nosotros, puede optar por buscar atencin mdica  en el consultorio de su doctor(a), en una clnica privada, en un centro de atencin urgente o en una sala de emergencias.  Si tiene una emergencia mdica, por favor llame inmediatamente al 911 o vaya a la sala de emergencias.  Nmeros de bper  - Dr. Kowalski: [phone]  - Dra. Moye: [phone]  - Dra. Stewart: [phone]  En caso de inclemencias del tiempo, por favor llame a nuestra lnea principal al [phone] para una actualizacin sobre el estado de cualquier retraso o cierre.  Consejos para la medicacin en dermatologa: Por favor, guarde las cajas en las que vienen los medicamentos de uso tpico para ayudarle a seguir las instrucciones sobre dnde y cmo usarlos. Las farmacias generalmente imprimen las instrucciones del medicamento slo en las cajas y no directamente en los tubos del medicamento.   Si su medicamento es muy caro, por favor, pngase en contacto con nuestra oficina llamando al [phone] y presione la opcin 4 o envenos un mensaje a travs de MyChart.   No podemos decirle cul ser su copago por los medicamentos por adelantado ya que esto es diferente dependiendo de la cobertura de su seguro.  Sin embargo, es posible que podamos encontrar un medicamento sustituto a menor costo o llenar un formulario para que el seguro cubra el medicamento que se considera necesario.   Si se requiere una autorizacin previa para que su compaa de seguros cubra su medicamento, por favor permtanos de 1 a 2 das hbiles para completar este proceso.  Los precios de los medicamentos varan con frecuencia dependiendo del lugar de dnde se surte la receta y alguna farmacias pueden ofrecer precios ms baratos.  El sitio web www.goodrx.com tiene cupones para medicamentos de diferentes farmacias. Los precios aqu no tienen en cuenta lo que podra costar con la ayuda del seguro (puede ser ms barato con su seguro), pero el sitio web puede darle el precio si no utiliz ningn seguro.  - Puede imprimir el cupn correspondiente y llevarlo con su receta a la farmacia.  - Tambin puede pasar por nuestra oficina durante el horario de atencin regular y recoger una tarjeta de cupones de GoodRx.  -   Si necesita que su receta se enve electrnicamente a una farmacia diferente, informe a nuestra oficina a travs de MyChart de  o por telfono llamando al [phone] y presione la opcin 4.  

## 2022-06-01 NOTE — Progress Notes (Signed)
Follow-Up Visit   Subjective  Deanna White is a 88 y.o. female who presents for the following: Annual Exam (History of SCC and BCC - The patient presents for Total-Body Skin Exam (TBSE) for skin cancer screening and mole check.  The patient has spots, moles and lesions to be evaluated, some may be new or changing and the patient has concerns that these could be cancer./).  Accompanied by daughter  The following portions of the chart were reviewed this encounter and updated as appropriate:   Tobacco  Allergies  Meds  Problems  Med Hx  Surg Hx  Fam Hx     Review of Systems:  No other skin or systemic complaints except as noted in HPI or Assessment and Plan.  Objective  Well appearing patient in no apparent distress; mood and affect are within normal limits.  A full examination was performed including scalp, head, eyes, ears, nose, lips, neck, chest, axillae, abdomen, back, buttocks, bilateral upper extremities, bilateral lower extremities, hands, feet, fingers, toes, fingernails, and toenails. All findings within normal limits unless otherwise noted below.  Left Lower Leg - Anterior Erythematous stuck-on, waxy papule or plaque  Feet Scale   Assessment & Plan   History of Squamous Cell Carcinoma of the Skin - No evidence of recurrence today - No lymphadenopathy - Recommend regular full body skin exams - Recommend daily broad spectrum sunscreen SPF 30+ to sun-exposed areas, reapply every 2 hours as needed.  - Call if any new or changing lesions are noted between office visits  History of Basal Cell Carcinoma of the Skin - No evidence of recurrence today - Recommend regular full body skin exams - Recommend daily broad spectrum sunscreen SPF 30+ to sun-exposed areas, reapply every 2 hours as needed.  - Call if any new or changing lesions are noted between office visits  Lentigines - Scattered tan macules - Due to sun exposure - Benign-appearing, observe - Recommend  daily broad spectrum sunscreen SPF 30+ to sun-exposed areas, reapply every 2 hours as needed. - Call for any changes  Seborrheic Keratoses - Stuck-on, waxy, tan-brown papules and/or plaques  - Benign-appearing - Discussed benign etiology and prognosis. - Observe - Call for any changes  Melanocytic Nevi - Tan-brown and/or pink-flesh-colored symmetric macules and papules - Benign appearing on exam today - Observation - Call clinic for new or changing moles - Recommend daily use of broad spectrum spf 30+ sunscreen to sun-exposed areas.   Hemangiomas - Red papules - Discussed benign nature - Observe - Call for any changes  Actinic Damage - Chronic condition, secondary to cumulative UV/sun exposure - diffuse scaly erythematous macules with underlying dyspigmentation - Recommend daily broad spectrum sunscreen SPF 30+ to sun-exposed areas, reapply every 2 hours as needed.  - Staying in the shade or wearing long sleeves, sun glasses (UVA+UVB protection) and wide brim hats (4-inch brim around the entire circumference of the hat) are also recommended for sun protection.  - Call for new or changing lesions.  Skin cancer screening performed today.  Inflamed seborrheic keratosis Left Lower Leg - Anterior  RTC if not resolved after 6 weeks  Destruction of lesion - Left Lower Leg - Anterior Complexity: simple   Destruction method: cryotherapy   Informed consent: discussed and consent obtained   Timeout:  patient name, date of birth, surgical site, and procedure verified Lesion destroyed using liquid nitrogen: Yes   Region frozen until ice ball extended beyond lesion: Yes   Outcome: patient tolerated procedure  well with no complications   Post-procedure details: wound care instructions given    Tinea pedis of both feet Feet Chronic and persistent condition with duration or expected duration over one year. Condition is symptomatic / bothersome to patient. Not to goal.   Ketocoanzole  2% cream qhs  ketoconazole (NIZORAL) 2 % cream - Feet Apply 1 Application topically at bedtime.  Return in about 1 year (around 06/02/2023) for TBSE.  I, Ashok Cordia, CMA, am acting as scribe for Sarina Ser, MD . Documentation: I have reviewed the above documentation for accuracy and completeness, and I agree with the above.  Sarina Ser, MD

## 2022-06-08 ENCOUNTER — Encounter: Payer: Self-pay | Admitting: Dermatology

## 2022-06-16 NOTE — Progress Notes (Unsigned)
Cardiology Office Note    Date:  06/19/2022   ID:  REOLA BUCKLES, DOB [birth year], MRN [id number]  PCP:  Crecencio Mc, MD  Cardiologist:  Ida Rogue, MD  Electrophysiologist:  None   Chief Complaint: Follow up  History of Present Illness:   Deanna White is a 88 y.o. female with history of SVT/atrial tachycardia, PAD status post angioplasty of the SFA and popliteal arteries in 03/2019 followed by vascular surgery with most recent ABI from 10/2021 unchanged and normal, carotid artery stenosis, aortic atherosclerosis, mitral regurgitation, difficult to control HTN with normal renal artery ultrasound in 11/2021, HLD, hypothyroidism, and anemia who presents for follow-up of her SVT/atrial tachycardia.  She was previously followed by [name] cardiology.  Prior Holter monitor in 2017, performed through [facility], reportedly showed SVT/A-fib with results not available for review.  Documentation indicates the patient had short runs of SVT, "but it was labeled atrial fibrillation."  Stress test in 2017 unavailable for review.  Echo from 2019 demonstrated an EF of 55%, moderate LVH, normal RV systolic function, mild mitral regurgitation, and a sclerotic aortic valve.  She established with Dr. Rockey Situ in 2019.  Repeat Holter in 2019 showed runs of atrial tachycardia.  She has previously had fatigue on metoprolol.  She was last seen in the office in 03/2021 and was without symptoms of angina or cardiac decompensation.  She reported having pain down her left lower extremity.  Most recent carotid artery ultrasound from 04/2021 showed bilateral 1 to 39% ICA stenoses with bilateral antegrade flow of the vertebral arteries and normal flow hemodynamics within the bilateral subclavian arteries.  She comes in accompanied by her daughter today and is doing well from a cardiac perspective.  She remains quite active and independent.  No symptoms of angina or decompensation.  She does note some  shortness of breath with prolonged exertion or when ambulating up and down her stairs carrying her laundry.  No significant lower extremity swelling.  She is no longer on rosuvastatin secondary to myalgias.  She does feel like her blood pressure has been running on the low side, particularly diastolic readings.  With this, she indicates she "does not feel well."   Labs independently reviewed: 11/2021 - TSH normal, Hgb 11.1, PLT 125, potassium 4.3, BUN 21, serum creatinine 0.97, albumin 3.8, AST/ALT normal 11/2020 - TC 182, TG 77, HDL 68, LDL 98  Past Medical History:  Diagnosis Date   Actinic keratosis    Allergy    Anemia 02/25/2015   Basal cell carcinoma 06/01/2021   R temple - ED&C   Carotid artery occlusion    Colon polyps    Fall from slipping on ice 11/2013   Heart murmur    Hypertension    Menopause    Pneumonia    Positive TB test    pt denies   Sore throat    Squamous cell carcinoma of skin 04/17/2019   L post lateral ankle, shave removal 10/15/2019   Thyroid disease    Varicose veins     Past Surgical History:  Procedure Laterality Date   ABDOMINAL HYSTERECTOMY     APPENDECTOMY     BILATERAL SALPINGOOPHORECTOMY  1964   CESAREAN SECTION     EYE SURGERY Right March 2016   Cataract   LOWER EXTREMITY ANGIOGRAPHY Left 03/14/2019   Procedure: LOWER EXTREMITY ANGIOGRAPHY;  Surgeon: Katha Cabal, MD;  Location: [facility];  Service: Cardiovascular;  Laterality: Left;   TONSILLECTOMY  VEIN SURGERY Left    Leg    Current Medications: Current Meds  Medication Sig   cloNIDine (CATAPRES) 0.1 MG tablet 1 tablet in the am and 2 in the evening (Patient taking differently: 1 tablet in the am and 1 In the evening)   clopidogrel (PLAVIX) 75 MG tablet TAKE 1 TABLET BY MOUTH EVERY DAY   cyanocobalamin (,VITAMIN B-12,) 1000 MCG/ML injection Inject 1 mL (1,000 mcg total) into the muscle every 30 (thirty) days.   dorzolamide (TRUSOPT) 2 % ophthalmic solution Place  1 drop into both eyes 3 (three) times daily.    furosemide (LASIX) 20 MG tablet Take 1 tablet (20 mg total) by mouth every other day. As needed for swellling   ketoconazole (NIZORAL) 2 % cream Apply 1 Application topically at bedtime.   levothyroxine (SYNTHROID) 75 MCG tablet TAKE 1 TABLET BY MOUTH EVERY DAY   losartan (COZAAR) 100 MG tablet TAKE 1 TABLET BY MOUTH EVERY DAY   Multiple Vitamins-Minerals (PRESERVISION AREDS 2) CAPS Take 1 capsule by mouth 2 (two) times daily.    NEEDLE, DISP, 25 G 25G X 1" MISC Use to give b12 injection once monthly.   Syringe/Needle, Disp, (SYRINGE 3CC/25GX1") 25G X 1" 3 ML MISC Use to give b12 injection once monthly.   [DISCONTINUED] amLODipine (NORVASC) 5 MG tablet Take 1 tablet (5 mg total) by mouth 2 (two) times daily. (Patient taking differently: Take 5 mg by mouth daily. Once daily)    Allergies:   Hctz [hydrochlorothiazide], Adhesive [tape], and Brimonidine   Social History   Socioeconomic History   Marital status: Widowed    Spouse name: Not on file   Number of children: Not on file   Years of education: Not on file   Highest education level: Not on file  Occupational History   Not on file  Tobacco Use   Smoking status: Never   Smokeless tobacco: Never  Substance and Sexual Activity   Alcohol use: No   Drug use: No   Sexual activity: Never  Other Topics Concern   Not on file  Social History Narrative   Not on file   Social Determinants of Health   Financial Resource Strain: Low Risk  (09/27/2020)   Overall Financial Resource Strain (CARDIA)    Difficulty of Paying Living Expenses: Not hard at all  Food Insecurity: No Food Insecurity (09/27/2020)   Hunger Vital Sign    Worried About Running Out of Food in the Last Year: Never true    [place] in the Last Year: Never true  Transportation Needs: No Transportation Needs (09/27/2020)   PRAPARE - Hydrologist (Medical): No    Lack of Transportation  (Non-Medical): No  Physical Activity: Not on file  Stress: No Stress Concern Present (09/27/2020)   [place]    Feeling of Stress : Not at all  Social Connections: Unknown (09/27/2020)   Social Connection and Isolation Panel [NHANES]    Frequency of Communication with Friends and Family: More than three times a week    Frequency of Social Gatherings with Friends and Family: More than three times a week    Attends Religious Services: Not on Advertising copywriter or Organizations: Not on file    Attends Archivist Meetings: Not on file    Marital Status: Not on file     Family History:  The patient's family history  includes Alcohol abuse in her brother; Cancer in her brother, brother, daughter, and paternal grandmother; Deep vein thrombosis in her mother; Heart attack in her mother and sister; Heart disease in her brother, mother, sister, and sister; Hypertension in her mother and another family member; Kidney disease in her father; Other in an other family member; Varicose Veins in her sister.  ROS:   12-point review of systems is negative unless otherwise noted in the HPI.   EKGs/Labs/Other Studies Reviewed:    Studies reviewed were summarized above. The additional studies were reviewed today:  2D echo 12/03/2017 ([facility]):INTERPRETATION  NORMAL LEFT VENTRICULAR SYSTOLIC FUNCTION   WITH MODERATE LVH  NORMAL RIGHT VENTRICULAR SYSTOLIC FUNCTION  MILD VALVULAR REGURGITATION (See above)  NO VALVULAR STENOSIS  MILD TO MODERATE MR  MILD TR, PR  MILD PHTN  SCLEROTIC AoV  EF >55%  __________  2D echo 04/13/2016 [facility]): INTERPRETATION  NORMAL LEFT VENTRICULAR SYSTOLIC FUNCTION WITH AN ESTIMATED EF = 65 %  NORMAL RIGHT VENTRICULAR SYSTOLIC FUNCTION  MODERATE MITRAL VALVE INSUFFICIENCY  MILD-TO-MODERATE TRICUSPID VALVE INSUFFICIENCY  NO VALVULAR STENOSIS  MILD LA ENLARGEMENT  MILD LVH   SCLEROTIC AORTIC VALVE    EKG:  EKG is ordered today.  The EKG ordered today demonstrates NSR, 71 bpm, no acute ST-T changes  Recent Labs: 12/29/2021: BUN 21; Creatinine, Ser 0.97; Hemoglobin 11.1; Platelets 125.0; Potassium 4.3; Sodium 137; TSH 1.87 06/19/2022: ALT 10  Recent Lipid Panel    Component Value Date/Time   CHOL 137 06/19/2022 1023   TRIG 78 06/19/2022 1023   HDL 58 06/19/2022 1023   CHOLHDL 2.4 06/19/2022 1023   VLDL 16 06/19/2022 1023   LDLCALC 63 06/19/2022 1023   LDLDIRECT 76.0 08/31/2016 1145    PHYSICAL EXAM:    VS:  BP (!) 120/54 (BP Location: Left Arm, Patient Position: Sitting, Cuff Size: Normal)   Pulse 71   Ht '5\' 2"'$  (1.575 m)   Wt 115 lb 8 oz (52.4 kg)   SpO2 95%   BMI 21.13 kg/m   BMI: Body mass index is 21.13 kg/m.  Physical Exam Vitals reviewed.  Constitutional:      Appearance: She is well-developed.  HENT:     Head: Normocephalic and atraumatic.  Eyes:     General:        Right eye: No discharge.        Left eye: No discharge.  Neck:     Vascular: No JVD.  Cardiovascular:     Rate and Rhythm: Normal rate and regular rhythm.     Pulses:          Posterior tibial pulses are 2+ on the right side and 2+ on the left side.     Heart sounds: S1 normal and S2 normal. Heart sounds not distant. No midsystolic click and no opening snap. Murmur heard.     Systolic murmur is present with a grade of 2/6 at the upper right sternal border.     No friction rub.  Pulmonary:     Effort: Pulmonary effort is normal. No respiratory distress.     Breath sounds: Normal breath sounds. No decreased breath sounds, wheezing or rales.  Chest:     Chest wall: No tenderness.  Abdominal:     General: There is no distension.  Musculoskeletal:     Cervical back: Normal range of motion.     Right lower leg: No edema.     Left lower leg: No edema.  Skin:    General:  Skin is warm and dry.     Nails: There is no clubbing.  Neurological:     Mental Status: She is  alert and oriented to person, place, and time.  Psychiatric:        Speech: Speech normal.        Behavior: Behavior normal.        Thought Content: Thought content normal.        Judgment: Judgment normal.     Wt Readings from Last 3 Encounters:  06/19/22 115 lb 8 oz (52.4 kg)  01/20/22 119 lb 12.8 oz (54.3 kg)  12/29/21 120 lb (54.4 kg)     ASSESSMENT & PLAN:   SVT/atrial tachycardia: Prior cardiology documentation indicates no objective evidence of reported A-fib noted at outside cardiology office.  Not on AV nodal blocking medications secondary to fatigue associated with beta-blockers.  Overall symptoms are quiescent.  Continue to monitor.  Cardiac murmur: Best heard in the upper right sternal border.  Echo in 11/2017 demonstrated mild mitral regurgitation with a sclerotic aortic valve without evidence of stenosis.  Update echo.  Aortic atherosclerosis/HLD: LDL 98 in 11/2020 with goal being < 70.  No longer on statin therapy secondary to myalgias associated with rosuvastatin.  Check lipid panel, direct LDL, and LFT.  May need to undergo a trial of ezetimibe 10 mg daily.  HTN: Blood pressure is well controlled in the office today.  However, she does note some fatigue with lower BP readings in the 1 teens to 120s.  She would prefer her blood pressure being in the 263Z systolic.  In this setting, we will decrease amlodipine to 2.5 mg daily in the morning and 5 mg in the evening.  She otherwise remains on losartan 100 mg and clonidine 0.1 mg twice daily.  PAD/carotid artery stenosis: She remains on clopidogrel.  No longer on statin therapy secondary to myalgias associated with rosuvastatin.  Follow-up with vascular surgery as directed.   Disposition: F/u with Dr. Rockey Situ or an APP in 2 months.   Medication Adjustments/Labs and Tests Ordered: Current medicines are reviewed at length with the patient today.  Concerns regarding medicines are outlined above. Medication changes, Labs and  Tests ordered today are summarized above and listed in the Patient Instructions accessible in Encounters.   Signed, Christell Faith, PA-C 06/19/2022 11:05 AM     [facility] [address] [place] [address] [place], [place] [address] [phone]

## 2022-06-19 ENCOUNTER — Ambulatory Visit: Payer: Medicare PPO | Attending: Physician Assistant | Admitting: Physician Assistant

## 2022-06-19 ENCOUNTER — Other Ambulatory Visit
Admission: RE | Admit: 2022-06-19 | Discharge: 2022-06-19 | Disposition: A | Discharge status: Home / Self Care | Payer: Medicare PPO | Source: Ambulatory Visit | Attending: Physician Assistant | Admitting: Physician Assistant

## 2022-06-19 ENCOUNTER — Encounter: Payer: Self-pay | Admitting: Physician Assistant

## 2022-06-19 VITALS — BP 120/54 | HR 71 | Ht 62.0 in | Wt 115.5 lb

## 2022-06-19 DIAGNOSIS — I471 Supraventricular tachycardia, unspecified: Secondary | ICD-10-CM

## 2022-06-19 DIAGNOSIS — I10 Essential (primary) hypertension: Secondary | ICD-10-CM | POA: Diagnosis not present

## 2022-06-19 DIAGNOSIS — I700 Atherosclerosis of aorta: Secondary | ICD-10-CM | POA: Diagnosis not present

## 2022-06-19 DIAGNOSIS — I739 Peripheral vascular disease, unspecified: Secondary | ICD-10-CM

## 2022-06-19 DIAGNOSIS — E785 Hyperlipidemia, unspecified: Secondary | ICD-10-CM | POA: Diagnosis not present

## 2022-06-19 DIAGNOSIS — I6523 Occlusion and stenosis of bilateral carotid arteries: Secondary | ICD-10-CM

## 2022-06-19 DIAGNOSIS — R011 Cardiac murmur, unspecified: Secondary | ICD-10-CM | POA: Diagnosis not present

## 2022-06-19 DIAGNOSIS — I340 Nonrheumatic mitral (valve) insufficiency: Secondary | ICD-10-CM

## 2022-06-19 DIAGNOSIS — I4719 Other supraventricular tachycardia: Secondary | ICD-10-CM

## 2022-06-19 LAB — HEPATIC FUNCTION PANEL
ALT: 10 U/L (ref 0–44)
AST: 22 U/L (ref 15–41)
Albumin: 3.7 g/dL (ref 3.5–5.0)
Alkaline Phosphatase: 75 U/L (ref 38–126)
Bilirubin, Direct: <0.1 mg/dL (ref 0.0–0.2)
Total Bilirubin: 0.7 mg/dL (ref 0.3–1.2)
Total Protein: 6.3 g/dL — ABNORMAL LOW (ref 6.5–8.1)

## 2022-06-19 LAB — LIPID PANEL
Cholesterol: 137 mg/dL (ref 0–200)
HDL: 58 mg/dL (ref >40)
LDL Cholesterol: 63 mg/dL (ref 0–99)
Total CHOL/HDL Ratio: 2.4 RATIO
Triglycerides: 78 mg/dL (ref <150)
VLDL: 16 mg/dL (ref 0–40)

## 2022-06-19 LAB — LDL CHOLESTEROL, DIRECT: Direct LDL: 62 mg/dL (ref 0–99)

## 2022-06-19 MED ORDER — AMLODIPINE BESYLATE 5 MG PO TABS: ORAL_TABLET | ORAL | 3 refills | Status: AC

## 2022-06-19 NOTE — Patient Instructions (Signed)
Medication Instructions:   Your physician has recommended you make the following change in your medication:    DECREASE your Amlodipine to 2.5 MG every morning, and 5 MG every evening.  *If you need a refill on your cardiac medications before your next appointment, please call your pharmacy*   Lab Work:  Please go to the [facility] after your appointment today for a lab draw (Lipid panel, Direct LDL, and Liver panel).   Testing/Procedures:  Your physician has requested that you have an echocardiogram. Echocardiography is a painless test that uses sound waves to create images of your heart. It provides your doctor with information about the size and shape of your heart and how well your heart's chambers and valves are working. This procedure takes approximately one hour. There are no restrictions for this procedure.    Follow-Up: At [facility], you and your health needs are our priority.  As part of our continuing mission to provide you with exceptional heart care, we have created designated Provider Care Teams.  These Care Teams include your primary Cardiologist (physician) and Advanced Practice Providers (APPs -  Physician Assistants and Nurse Practitioners) who all work together to provide you with the care you need, when you need it.  We recommend signing up for the patient portal called "MyChart".  Sign up information is provided on this After Visit Summary.  MyChart is used to connect with patients for Virtual Visits (Telemedicine).  Patients are able to view lab/test results, encounter notes, upcoming appointments, etc.  Non-urgent messages can be sent to your provider as well.   To learn more about what you can do with MyChart, go to [email or web address].    Your next appointment:   2 month(s)  The format for your next appointment:   In Person  Provider:   You may see Ida Rogue, MD or one of the following Advanced Practice Providers on your designated Care  Team:   Murray Hodgkins, NP Christell Faith, PA-C Cadence Kathlen Mody, PA-C Gerrie Nordmann, NP    Other Instructions   Important Information About Sugar

## 2022-07-02 ENCOUNTER — Other Ambulatory Visit: Payer: Self-pay | Admitting: Internal Medicine

## 2022-07-12 ENCOUNTER — Other Ambulatory Visit (INDEPENDENT_AMBULATORY_CARE_PROVIDER_SITE_OTHER): Payer: Self-pay | Admitting: Vascular Surgery

## 2022-07-21 ENCOUNTER — Ambulatory Visit: Payer: Medicare PPO | Attending: Physician Assistant

## 2022-07-21 DIAGNOSIS — R011 Cardiac murmur, unspecified: Secondary | ICD-10-CM | POA: Diagnosis not present

## 2022-07-21 LAB — ECHOCARDIOGRAM COMPLETE
AR max vel: 1.31 cm2
AV Area VTI: 1.26 cm2
AV Area mean vel: 1.29 cm2
AV Mean grad: 12.8 mmHg
AV Peak grad: 22.8 mmHg
Ao pk vel: 2.39 m/s
Area-P 1/2: 3.72 cm2
MV VTI: 1.16 cm2
S' Lateral: 1.7 cm
Single Plane A4C EF: 61.9 %

## 2022-07-26 ENCOUNTER — Telehealth: Payer: Self-pay | Admitting: *Deleted

## 2022-07-26 NOTE — Telephone Encounter (Signed)
Echo findings are indicative of fluid accumulation, though if she is asymptomatic, she can hold on taking Lasix daily and take prn for SOB or weight gain.

## 2022-07-26 NOTE — Telephone Encounter (Signed)
-----   Message from Rise Mu, PA-C sent at 07/23/2022 10:40 AM EDT ----- Echo showed normal pump function, normal wall motion, moderate thickening of the heart, mild stiffening of the heart, elevated right-sided pressure, mild to moderately leaky mitral valve, moderate calcification and narrowing of the mitral valve with normal valve area though, moderately leaky tricuspid valve, and mildly to moderately narrowed aortic valve.  Murmur noted on exam related to narrowed aortic valve and leaky mitral valve. When compared to echo from 2019, with [facility], her mitral and tricuspid valves are more leaky and her aortic valve is now mildly to moderately narrowed. The pressure within the right lower chamber of the heart is now elevated as well. These issues are likely contributing to her SOB.   Recommendations: -Please have her take Lasix 20 mg daily (currently on every other day) with a follow up BMET 1 week after increasing Lasix -Follow up as planned next month to reassess dyspnea

## 2022-07-26 NOTE — Telephone Encounter (Signed)
Reviewed results and recommendations with patient. She reports that she has not been taking the furosemide for a long time now. Patient states she has not had any fluid buildup or swelling. While reviewing results she was hesitant in starting that medication on regular basis. After discussing with her I will forward to provider to make him aware she has not been taking that medication as ordered then give her a call back with his recommendations. She was agreeable with this plan with no further questions at this time.

## 2022-07-27 MED ORDER — FUROSEMIDE 20 MG PO TABS: 20.0000 mg | ORAL_TABLET | ORAL | 3 refills | Status: DC | PRN

## 2022-07-27 NOTE — Telephone Encounter (Signed)
Reviewed provider recommendations and she was agreeable to take only as needed for shortness of breath or weight gain. She verbalized understanding of our conversation with no further questions at this time.

## 2022-07-28 ENCOUNTER — Encounter: Payer: Self-pay | Admitting: Internal Medicine

## 2022-07-28 ENCOUNTER — Ambulatory Visit: Payer: Medicare PPO | Admitting: Internal Medicine

## 2022-07-28 VITALS — BP 150/62 | HR 73 | Temp 97.7°F | Ht 62.0 in | Wt 111.6 lb

## 2022-07-28 DIAGNOSIS — M159 Polyosteoarthritis, unspecified: Secondary | ICD-10-CM | POA: Diagnosis not present

## 2022-07-28 DIAGNOSIS — H6993 Unspecified Eustachian tube disorder, bilateral: Secondary | ICD-10-CM

## 2022-07-28 DIAGNOSIS — E538 Deficiency of other specified B group vitamins: Secondary | ICD-10-CM

## 2022-07-28 DIAGNOSIS — D513 Other dietary vitamin B12 deficiency anemia: Secondary | ICD-10-CM

## 2022-07-28 DIAGNOSIS — I10 Essential (primary) hypertension: Secondary | ICD-10-CM | POA: Diagnosis not present

## 2022-07-28 DIAGNOSIS — Z23 Encounter for immunization: Secondary | ICD-10-CM | POA: Diagnosis not present

## 2022-07-28 LAB — CBC WITH DIFFERENTIAL/PLATELET
Basophils Absolute: 0 10*3/uL (ref 0.0–0.1)
Basophils Relative: 0.1 % (ref 0.0–3.0)
Eosinophils Absolute: 0.1 10*3/uL (ref 0.0–0.7)
Eosinophils Relative: 1.4 % (ref 0.0–5.0)
HCT: 24 % — ABNORMAL LOW (ref 36.0–46.0)
Hemoglobin: 8.1 g/dL — ABNORMAL LOW (ref 12.0–15.0)
Lymphocytes Relative: 25.1 % (ref 12.0–46.0)
Lymphs Abs: 1.1 10*3/uL (ref 0.7–4.0)
MCHC: 33.7 g/dL (ref 30.0–36.0)
MCV: 98.9 fl (ref 78.0–100.0)
Monocytes Absolute: 0.4 10*3/uL (ref 0.1–1.0)
Monocytes Relative: 9.9 % (ref 3.0–12.0)
Neutro Abs: 2.7 10*3/uL (ref 1.4–7.7)
Neutrophils Relative %: 63.5 % (ref 43.0–77.0)
Platelets: 130 10*3/uL — ABNORMAL LOW (ref 150.0–400.0)
RBC: 2.43 Mil/uL — ABNORMAL LOW (ref 3.87–5.11)
RDW: 15.7 % — ABNORMAL HIGH (ref 11.5–15.5)
WBC: 4.3 10*3/uL (ref 4.0–10.5)

## 2022-07-28 LAB — VITAMIN B12: Vitamin B-12: >1500 pg/mL — ABNORMAL HIGH (ref 211–911)

## 2022-07-28 MED ORDER — FLUTICASONE PROPIONATE 50 MCG/ACT NA SUSP: 2.0000 | Freq: Every day | NASAL | 6 refills | Status: DC

## 2022-07-28 NOTE — Patient Instructions (Addendum)
The feeling in your ears  may be due to Eustachian tube dysfunction.   I recommend a trial of Flonase nasal spray  and have sent it to your pharmacy.  Use 2 squirts on each side of nose once daily  Continue current clonidine regimen unless home BP is 160 or higher .  I will add a low dose of amlodipine if more medication is needed.   You have arthritis!  You can take up to 2000 mg of acetominophen (tylenol) every day safely  In divided doses (500 mg every 6 hours  Or 1000 mg every 12 hours  If your B12 level is normal today,  you can switch to a daily b12 supplement by mouth,  2500 mcg daily

## 2022-07-28 NOTE — Progress Notes (Unsigned)
Subjective:  [name], female    DOB: [birth year]  Age: 88 y.o. MRN: [id number]  CC: There were no encounter diagnoses.   HPI Deanna White presents for followup on multiple issues.    She has multipel complaunts,  Chief Complaint  Patient presents with   Follow-up    6 month follow up    1) abnormal sound of voice in her own ears . Denies ear pain,  but feels that "there's too much air in my head>"  2) HTN:  BP dropped "pretty low" on current regimen,  cardiologist reduced the evening dose of clonidine and 12/ clonidine in the morning.  She has noticed increased readings  since than and resumed 0.1 mg in the morning and 0.1 mg in the evening   3)  Right hip pain "catches me " at different times.  Pain migrates , sometimes groin  sometimes in the knee    Outpatient Medications Prior to Visit  Medication Sig Dispense Refill   amLODipine (NORVASC) 5 MG tablet Take 0.5 tab by mouth every AM, and take 1 tab  by mouth every PM 60 tablet 3   cloNIDine (CATAPRES) 0.1 MG tablet 1 tablet in the am and 2 in the evening (Patient taking differently: 1 tablet in the am and 1 In the evening) 270 tablet 2   clopidogrel (PLAVIX) 75 MG tablet TAKE 1 TABLET BY MOUTH EVERY DAY 90 tablet 1   cyanocobalamin (,VITAMIN B-12,) 1000 MCG/ML injection Inject 1 mL (1,000 mcg total) into the muscle every 30 (thirty) days. 10 mL 0   dorzolamide (TRUSOPT) 2 % ophthalmic solution Place 1 drop into both eyes 3 (three) times daily.   12   furosemide (LASIX) 20 MG tablet Take 1 tablet (20 mg total) by mouth as needed (As needed for shortness of breath or weight gain.). As needed for swellling 45 tablet 3   ketoconazole (NIZORAL) 2 % cream Apply 1 Application topically at bedtime. 60 g 11   levothyroxine (SYNTHROID) 75 MCG tablet TAKE 1 TABLET BY MOUTH EVERY DAY 90 tablet 1   losartan (COZAAR) 100 MG tablet TAKE 1 TABLET BY MOUTH EVERY DAY 90 tablet 1   Multiple Vitamins-Minerals (PRESERVISION AREDS 2)  CAPS Take 1 capsule by mouth 2 (two) times daily.      NEEDLE, DISP, 25 G 25G X 1" MISC Use to give b12 injection once monthly. 50 each 0   Syringe/Needle, Disp, (SYRINGE 3CC/25GX1") 25G X 1" 3 ML MISC Use to give b12 injection once monthly. 50 each 0   No facility-administered medications prior to visit.    Review of Systems;  Patient denies headache, fevers, malaise, unintentional weight loss, skin rash, eye pain, sinus congestion and sinus pain, sore throat, dysphagia,  hemoptysis , cough, dyspnea, wheezing, chest pain, palpitations, orthopnea, edema, abdominal pain, nausea, melena, diarrhea, constipation, flank pain, dysuria, hematuria, urinary  Frequency, nocturia, numbness, tingling, seizures,  Focal weakness, Loss of consciousness,  Tremor, insomnia, depression, anxiety, and suicidal ideation.      Objective:  BP (!) 150/62 (BP Location: Left Arm, Patient Position: Sitting, Cuff Size: Normal)   Pulse 73   Temp 97.7 F (36.5 C) (Oral)   Ht '5\' 2"'$  (1.575 m)   Wt 111 lb 9.6 oz (50.6 kg)   SpO2 98%   BMI 20.41 kg/m   BP Readings from Last 3 Encounters:  07/28/22 (!) 150/62  06/19/22 (!) 120/54  01/20/22 (!) 156/76    Wt Readings  from Last 3 Encounters:  07/28/22 111 lb 9.6 oz (50.6 kg)  06/19/22 115 lb 8 oz (52.4 kg)  01/20/22 119 lb 12.8 oz (54.3 kg)    General appearance: alert, cooperative and appears stated age Ears: normal TM's and external ear canals both ears Throat: lips, mucosa, and tongue normal; teeth and gums normal Neck: no adenopathy, no carotid bruit, supple, symmetrical, trachea midline and thyroid not enlarged, symmetric, no tenderness/mass/nodules Back: symmetric, no curvature. ROM normal. No CVA tenderness. Lungs: clear to auscultation bilaterally Heart: regular rate and rhythm, S1, S2 normal, no murmur, click, rub or gallop Abdomen: soft, non-tender; bowel sounds normal; no masses,  no organomegaly Pulses: 2+ and symmetric Skin: Skin color, texture,  turgor normal. No rashes or lesions Lymph nodes: Cervical, supraclavicular, and axillary nodes normal. Neuro:  awake and interactive with normal mood and affect. Higher cortical functions are normal. Speech is clear without word-finding difficulty or dysarthria. Extraocular movements are intact. Visual fields of both eyes are grossly intact. Sensation to light touch is grossly intact bilaterally of upper and lower extremities. Motor examination shows 4+/5 symmetric hand grip and upper extremity and 5/5 lower extremity strength. There is no pronation or drift. Gait is non-ataxic   No results found for: "HGBA1C"  Lab Results  Component Value Date   CREATININE 0.97 12/29/2021   CREATININE 0.88 10/14/2021   CREATININE 0.87 07/01/2021    Lab Results  Component Value Date   WBC 6.4 12/29/2021   HGB 11.1 (L) 12/29/2021   HCT 33.3 (L) 12/29/2021   PLT 125.0 (L) 12/29/2021   GLUCOSE 81 12/29/2021   CHOL 137 06/19/2022   TRIG 78 06/19/2022   HDL 58 06/19/2022   LDLDIRECT 62 06/19/2022   LDLCALC 63 06/19/2022   ALT 10 06/19/2022   AST 22 06/19/2022   NA 137 12/29/2021   K 4.3 12/29/2021   CL 104 12/29/2021   CREATININE 0.97 12/29/2021   BUN 21 12/29/2021   CO2 26 12/29/2021   TSH 1.87 12/29/2021   MICROALBUR <0.7 10/16/2017    No results found.  Assessment & Plan:   Problem List Items Addressed This Visit   None   I spent a total of   minutes with this patient in a face to face visit on the date of this encounter reviewing the last office visit with me in       ,  most recent visit with cardiology ,    ,  patient's diet and exercise habits, home blood pressure /blod sugar readings, recent ER visit including labs and imaging studies ,   and post visit ordering of testing and therapeutics.    Follow-up: No follow-ups on file.   Crecencio Mc, MD

## 2022-07-30 DIAGNOSIS — H6993 Unspecified Eustachian tube disorder, bilateral: Secondary | ICD-10-CM | POA: Insufficient documentation

## 2022-07-30 DIAGNOSIS — M1990 Unspecified osteoarthritis, unspecified site: Secondary | ICD-10-CM | POA: Insufficient documentation

## 2022-07-30 NOTE — Assessment & Plan Note (Signed)
No changes to regimen today given elevations noted by patient after recent reduction in clonidine dose by cardiology.  She is currently taking 0.1 mg clonidine twice daily

## 2022-07-30 NOTE — Assessment & Plan Note (Signed)
Recommend use of acetominopehn 1000 mg bid.

## 2022-07-30 NOTE — Assessment & Plan Note (Signed)
Trail of flonase recommended. Ear exam is normal

## 2022-07-30 NOTE — Assessment & Plan Note (Signed)
Intrinsic factor antibody negative.  Will recommend continued b12 supplementation orALLY   Lab Results  Component Value Date   VITAMINB12 >1500 (H) 07/28/2022

## 2022-07-31 ENCOUNTER — Other Ambulatory Visit: Payer: Self-pay | Admitting: Internal Medicine

## 2022-07-31 ENCOUNTER — Encounter (INDEPENDENT_AMBULATORY_CARE_PROVIDER_SITE_OTHER): Payer: Self-pay

## 2022-07-31 DIAGNOSIS — D649 Anemia, unspecified: Secondary | ICD-10-CM

## 2022-08-02 ENCOUNTER — Other Ambulatory Visit: Payer: Self-pay

## 2022-08-02 ENCOUNTER — Other Ambulatory Visit (INDEPENDENT_AMBULATORY_CARE_PROVIDER_SITE_OTHER): Payer: Medicare PPO

## 2022-08-02 ENCOUNTER — Telehealth: Payer: Self-pay

## 2022-08-02 DIAGNOSIS — H25811 Combined forms of age-related cataract, right eye: Secondary | ICD-10-CM | POA: Diagnosis not present

## 2022-08-02 DIAGNOSIS — H353123 Nonexudative age-related macular degeneration, left eye, advanced atrophic without subfoveal involvement: Secondary | ICD-10-CM | POA: Diagnosis not present

## 2022-08-02 DIAGNOSIS — H469 Unspecified optic neuritis: Secondary | ICD-10-CM | POA: Diagnosis not present

## 2022-08-02 DIAGNOSIS — H40013 Open angle with borderline findings, low risk, bilateral: Secondary | ICD-10-CM | POA: Diagnosis not present

## 2022-08-02 DIAGNOSIS — H353112 Nonexudative age-related macular degeneration, right eye, intermediate dry stage: Secondary | ICD-10-CM | POA: Diagnosis not present

## 2022-08-02 DIAGNOSIS — D649 Anemia, unspecified: Secondary | ICD-10-CM | POA: Diagnosis not present

## 2022-08-02 NOTE — Telephone Encounter (Signed)
Is there somewhere that you like to work pt in next week?

## 2022-08-02 NOTE — Telephone Encounter (Signed)
-----   Message from Crecencio Mc, MD sent at 07/31/2022  1:09 PM EDT ----- NEEDS LAB APPT ,  FECAL OCCULT BLOOD TEST , AND FOLLOW UP APPT   NEXT WEEK OR  FRIDAY  TO REVIEW THE RESULTS .   hgb hs dropped from 11 to 8,  additional  studies are needed including blood tests and a home fecal occult test, all of which I have ordered  .  The office will call you to schedulae this   Regards,   Deborra Medina, MD

## 2022-08-02 NOTE — Telephone Encounter (Signed)
LMTCB in regards to lab results.  

## 2022-08-02 NOTE — Telephone Encounter (Signed)
Pt is scheduled for next Tuesday at 9:30. Pt is ware of appt date and time.

## 2022-08-02 NOTE — Telephone Encounter (Signed)
The patient returned your call . She and her daughter were given the results. She will be in the office today to have labs done and pick up her stool kit. I don't have and available time to schedule the patient for a return visit next week.

## 2022-08-03 ENCOUNTER — Inpatient Hospital Stay
Admission: EM | Admit: 2022-08-03 | Discharge: 2022-08-11 | DRG: 291 | Disposition: A | Discharge status: Home Health | Payer: Medicare PPO | Attending: Internal Medicine | Admitting: Internal Medicine

## 2022-08-03 ENCOUNTER — Telehealth: Payer: Self-pay | Admitting: *Deleted

## 2022-08-03 ENCOUNTER — Other Ambulatory Visit: Payer: Self-pay

## 2022-08-03 ENCOUNTER — Emergency Department: Payer: Medicare PPO

## 2022-08-03 DIAGNOSIS — D638 Anemia in other chronic diseases classified elsewhere: Secondary | ICD-10-CM | POA: Diagnosis not present

## 2022-08-03 DIAGNOSIS — E538 Deficiency of other specified B group vitamins: Secondary | ICD-10-CM | POA: Diagnosis present

## 2022-08-03 DIAGNOSIS — M810 Age-related osteoporosis without current pathological fracture: Secondary | ICD-10-CM | POA: Diagnosis present

## 2022-08-03 DIAGNOSIS — Z1152 Encounter for screening for COVID-19: Secondary | ICD-10-CM | POA: Diagnosis not present

## 2022-08-03 DIAGNOSIS — I1A0 Resistant hypertension: Secondary | ICD-10-CM | POA: Diagnosis present

## 2022-08-03 DIAGNOSIS — K449 Diaphragmatic hernia without obstruction or gangrene: Secondary | ICD-10-CM | POA: Diagnosis present

## 2022-08-03 DIAGNOSIS — I5033 Acute on chronic diastolic (congestive) heart failure: Secondary | ICD-10-CM | POA: Insufficient documentation

## 2022-08-03 DIAGNOSIS — I110 Hypertensive heart disease with heart failure: Secondary | ICD-10-CM | POA: Diagnosis present

## 2022-08-03 DIAGNOSIS — J9811 Atelectasis: Secondary | ICD-10-CM | POA: Diagnosis present

## 2022-08-03 DIAGNOSIS — Z91048 Other nonmedicinal substance allergy status: Secondary | ICD-10-CM

## 2022-08-03 DIAGNOSIS — I083 Combined rheumatic disorders of mitral, aortic and tricuspid valves: Secondary | ICD-10-CM | POA: Diagnosis present

## 2022-08-03 DIAGNOSIS — I700 Atherosclerosis of aorta: Secondary | ICD-10-CM | POA: Diagnosis present

## 2022-08-03 DIAGNOSIS — Z66 Do not resuscitate: Secondary | ICD-10-CM | POA: Diagnosis not present

## 2022-08-03 DIAGNOSIS — I5082 Biventricular heart failure: Secondary | ICD-10-CM | POA: Diagnosis present

## 2022-08-03 DIAGNOSIS — I350 Nonrheumatic aortic (valve) stenosis: Secondary | ICD-10-CM | POA: Diagnosis not present

## 2022-08-03 DIAGNOSIS — Z79899 Other long term (current) drug therapy: Secondary | ICD-10-CM

## 2022-08-03 DIAGNOSIS — I509 Heart failure, unspecified: Secondary | ICD-10-CM | POA: Diagnosis not present

## 2022-08-03 DIAGNOSIS — R531 Weakness: Secondary | ICD-10-CM | POA: Diagnosis not present

## 2022-08-03 DIAGNOSIS — Z7902 Long term (current) use of antithrombotics/antiplatelets: Secondary | ICD-10-CM

## 2022-08-03 DIAGNOSIS — E039 Hypothyroidism, unspecified: Secondary | ICD-10-CM | POA: Diagnosis present

## 2022-08-03 DIAGNOSIS — E782 Mixed hyperlipidemia: Secondary | ICD-10-CM | POA: Diagnosis present

## 2022-08-03 DIAGNOSIS — Z8249 Family history of ischemic heart disease and other diseases of the circulatory system: Secondary | ICD-10-CM

## 2022-08-03 DIAGNOSIS — J9601 Acute respiratory failure with hypoxia: Secondary | ICD-10-CM | POA: Diagnosis present

## 2022-08-03 DIAGNOSIS — D649 Anemia, unspecified: Principal | ICD-10-CM | POA: Diagnosis present

## 2022-08-03 DIAGNOSIS — I872 Venous insufficiency (chronic) (peripheral): Secondary | ICD-10-CM | POA: Diagnosis present

## 2022-08-03 DIAGNOSIS — N179 Acute kidney failure, unspecified: Secondary | ICD-10-CM | POA: Diagnosis not present

## 2022-08-03 DIAGNOSIS — Z9079 Acquired absence of other genital organ(s): Secondary | ICD-10-CM

## 2022-08-03 DIAGNOSIS — J90 Pleural effusion, not elsewhere classified: Secondary | ICD-10-CM | POA: Diagnosis not present

## 2022-08-03 DIAGNOSIS — Z90710 Acquired absence of both cervix and uterus: Secondary | ICD-10-CM

## 2022-08-03 DIAGNOSIS — I2489 Other forms of acute ischemic heart disease: Secondary | ICD-10-CM | POA: Diagnosis present

## 2022-08-03 DIAGNOSIS — R109 Unspecified abdominal pain: Secondary | ICD-10-CM | POA: Diagnosis not present

## 2022-08-03 DIAGNOSIS — K5730 Diverticulosis of large intestine without perforation or abscess without bleeding: Secondary | ICD-10-CM | POA: Diagnosis present

## 2022-08-03 DIAGNOSIS — Z90722 Acquired absence of ovaries, bilateral: Secondary | ICD-10-CM

## 2022-08-03 DIAGNOSIS — D6959 Other secondary thrombocytopenia: Secondary | ICD-10-CM | POA: Diagnosis present

## 2022-08-03 DIAGNOSIS — Z85828 Personal history of other malignant neoplasm of skin: Secondary | ICD-10-CM | POA: Diagnosis not present

## 2022-08-03 DIAGNOSIS — D500 Iron deficiency anemia secondary to blood loss (chronic): Secondary | ICD-10-CM

## 2022-08-03 DIAGNOSIS — Z79890 Hormone replacement therapy (postmenopausal): Secondary | ICD-10-CM

## 2022-08-03 DIAGNOSIS — I739 Peripheral vascular disease, unspecified: Secondary | ICD-10-CM | POA: Diagnosis present

## 2022-08-03 DIAGNOSIS — R0902 Hypoxemia: Secondary | ICD-10-CM | POA: Diagnosis not present

## 2022-08-03 DIAGNOSIS — I10 Essential (primary) hypertension: Secondary | ICD-10-CM | POA: Diagnosis present

## 2022-08-03 DIAGNOSIS — I272 Pulmonary hypertension, unspecified: Secondary | ICD-10-CM | POA: Diagnosis present

## 2022-08-03 DIAGNOSIS — I480 Paroxysmal atrial fibrillation: Secondary | ICD-10-CM | POA: Diagnosis present

## 2022-08-03 DIAGNOSIS — Z888 Allergy status to other drugs, medicaments and biological substances status: Secondary | ICD-10-CM

## 2022-08-03 DIAGNOSIS — I50813 Acute on chronic right heart failure: Secondary | ICD-10-CM

## 2022-08-03 LAB — FOLATE: Folate: 35 ng/mL (ref >5.9)

## 2022-08-03 LAB — CBC WITH DIFFERENTIAL/PLATELET
Abs Immature Granulocytes: 0.06 10*3/uL (ref 0.00–0.07)
Basophils Absolute: 0 10*3/uL (ref 0.0–0.1)
Basophils Absolute: 0 10*3/uL (ref 0.0–0.1)
Basophils Relative: 0.3 % (ref 0.0–3.0)
Basophils Relative: 0 %
Eosinophils Absolute: 0.1 10*3/uL (ref 0.0–0.7)
Eosinophils Absolute: 0.1 10*3/uL (ref 0.0–0.5)
Eosinophils Relative: 1.4 % (ref 0.0–5.0)
Eosinophils Relative: 2 %
HCT: 22.2 % — CL (ref 36.0–46.0)
HCT: 23.8 % — ABNORMAL LOW (ref 36.0–46.0)
Hemoglobin: 7.5 g/dL — CL (ref 12.0–15.0)
Hemoglobin: 7.9 g/dL — ABNORMAL LOW (ref 12.0–15.0)
Immature Granulocytes: 1 %
Lymphocytes Relative: 18.6 % (ref 12.0–46.0)
Lymphocytes Relative: 21 %
Lymphs Abs: 0.8 10*3/uL (ref 0.7–4.0)
Lymphs Abs: 1 10*3/uL (ref 0.7–4.0)
MCH: 32.9 pg (ref 26.0–34.0)
MCHC: 33.7 g/dL (ref 30.0–36.0)
MCHC: 33.2 g/dL (ref 30.0–36.0)
MCV: 98.8 fl (ref 78.0–100.0)
MCV: 99.2 fL (ref 80.0–100.0)
Monocytes Absolute: 0.3 10*3/uL (ref 0.1–1.0)
Monocytes Absolute: 0.3 10*3/uL (ref 0.1–1.0)
Monocytes Relative: 6.5 % (ref 3.0–12.0)
Monocytes Relative: 7 %
Neutro Abs: 3.2 10*3/uL (ref 1.4–7.7)
Neutro Abs: 3.4 10*3/uL (ref 1.7–7.7)
Neutrophils Relative %: 73.2 % (ref 43.0–77.0)
Neutrophils Relative %: 69 %
Platelets: 118 10*3/uL — ABNORMAL LOW (ref 150.0–400.0)
Platelets: 128 10*3/uL — ABNORMAL LOW (ref 150–400)
RBC: 2.24 Mil/uL — ABNORMAL LOW (ref 3.87–5.11)
RBC: 2.4 MIL/uL — ABNORMAL LOW (ref 3.87–5.11)
RDW: 16.1 % — ABNORMAL HIGH (ref 11.5–15.5)
RDW: 15.4 % (ref 11.5–15.5)
WBC: 4.4 10*3/uL (ref 4.0–10.5)
WBC: 4.9 10*3/uL (ref 4.0–10.5)
nRBC: 0 % (ref 0.0–0.2)

## 2022-08-03 LAB — COMPREHENSIVE METABOLIC PANEL
ALT: 12 U/L (ref 0–44)
AST: 29 U/L (ref 15–41)
Albumin: 3.4 g/dL — ABNORMAL LOW (ref 3.5–5.0)
Alkaline Phosphatase: 93 U/L (ref 38–126)
Anion gap: 7 (ref 5–15)
BUN: 22 mg/dL (ref 8–23)
CO2: 23 mmol/L (ref 22–32)
Calcium: 8.9 mg/dL (ref 8.9–10.3)
Chloride: 104 mmol/L (ref 98–111)
Creatinine, Ser: 1.42 mg/dL — ABNORMAL HIGH (ref 0.44–1.00)
GFR, Estimated: 34 mL/min — ABNORMAL LOW (ref >60)
Glucose, Bld: 133 mg/dL — ABNORMAL HIGH (ref 70–99)
Potassium: 4 mmol/L (ref 3.5–5.1)
Sodium: 134 mmol/L — ABNORMAL LOW (ref 135–145)
Total Bilirubin: 0.5 mg/dL (ref 0.3–1.2)
Total Protein: 6.4 g/dL — ABNORMAL LOW (ref 6.5–8.1)

## 2022-08-03 LAB — URINALYSIS, ROUTINE W REFLEX MICROSCOPIC
Bacteria, UA: NONE SEEN
Bilirubin Urine: NEGATIVE
Glucose, UA: NEGATIVE mg/dL
Hgb urine dipstick: NEGATIVE
Ketones, ur: NEGATIVE mg/dL
Leukocytes,Ua: NEGATIVE
Nitrite: NEGATIVE
Protein, ur: 100 mg/dL — AB
Specific Gravity, Urine: 1.021 (ref 1.005–1.030)
pH: 5 (ref 5.0–8.0)

## 2022-08-03 LAB — BRAIN NATRIURETIC PEPTIDE: B Natriuretic Peptide: 708.7 pg/mL — ABNORMAL HIGH (ref 0.0–100.0)

## 2022-08-03 LAB — TECHNOLOGIST SMEAR REVIEW: Plt Morphology: NORMAL

## 2022-08-03 LAB — VITAMIN B12: Vitamin B-12: 1218 pg/mL — ABNORMAL HIGH (ref 180–914)

## 2022-08-03 LAB — LACTATE DEHYDROGENASE: LDH: 261 U/L — ABNORMAL HIGH (ref 98–192)

## 2022-08-03 LAB — FERRITIN: Ferritin: 846 ng/mL — ABNORMAL HIGH (ref 11–307)

## 2022-08-03 LAB — RETICULOCYTES
ABS Retic: 35840 cells/uL (ref 20000–80000)
Immature Retic Fract: 19.2 % — ABNORMAL HIGH (ref 2.3–15.9)
RBC.: 2.37 MIL/uL — ABNORMAL LOW (ref 3.87–5.11)
Retic Count, Absolute: 46.2 10*3/uL (ref 19.0–186.0)
Retic Ct Pct: 1.6 %
Retic Ct Pct: 2 % (ref 0.4–3.1)

## 2022-08-03 LAB — CBC
HCT: 23.8 % — ABNORMAL LOW (ref 36.0–46.0)
Hemoglobin: 7.9 g/dL — ABNORMAL LOW (ref 12.0–15.0)
MCH: 33.1 pg (ref 26.0–34.0)
MCHC: 33.2 g/dL (ref 30.0–36.0)
MCV: 99.6 fL (ref 80.0–100.0)
Platelets: 130 10*3/uL — ABNORMAL LOW (ref 150–400)
RBC: 2.39 MIL/uL — ABNORMAL LOW (ref 3.87–5.11)
RDW: 15.5 % (ref 11.5–15.5)
WBC: 4.8 10*3/uL (ref 4.0–10.5)
nRBC: 0 % (ref 0.0–0.2)

## 2022-08-03 LAB — IRON AND TIBC
Iron: 55 ug/dL (ref 28–170)
Saturation Ratios: 23 % (ref 10.4–31.8)
TIBC: 241 ug/dL — ABNORMAL LOW (ref 250–450)
UIBC: 186 ug/dL

## 2022-08-03 LAB — IBC + FERRITIN
Ferritin: 763.5 ng/mL — ABNORMAL HIGH (ref 10.0–291.0)
Iron: 54 ug/dL (ref 42–145)
Saturation Ratios: 22.7 % (ref 20.0–50.0)
TIBC: 238 ug/dL — ABNORMAL LOW (ref 250.0–450.0)
Transferrin: 170 mg/dL — ABNORMAL LOW (ref 212.0–360.0)

## 2022-08-03 LAB — SARS CORONAVIRUS 2 BY RT PCR: SARS Coronavirus 2 by RT PCR: NEGATIVE

## 2022-08-03 LAB — LIPASE, BLOOD: Lipase: 40 U/L (ref 11–51)

## 2022-08-03 LAB — TROPONIN I (HIGH SENSITIVITY): Troponin I (High Sensitivity): 15 ng/L (ref <18)

## 2022-08-03 MED ORDER — IOHEXOL 300 MG/ML  SOLN
75.0000 mL | Freq: Once | INTRAMUSCULAR | Status: AC | PRN
  Administered 2022-08-03: 75 mL via INTRAVENOUS

## 2022-08-03 NOTE — ED Provider Notes (Signed)
[facility] Provider Note    Event Date/Time   First MD Initiated Contact with Patient 08/03/22 1525     (approximate)   History   Abdominal Pain   HPI  Deanna White is a 88 y.o. female with past medical history of hypertension, B12 deficiency, right-sided heart failure, mitral valve regurgitation, aortic stenosis, mitral stenosis, here with generalized weakness and some shortness of breath.  Patient reports that over the last several weeks she has had progressively worsening generalized weakness, shortness of breath with exertion, and fatigue.  She has been seen by her PCP and had outpatient labs which showed a decrease in hemoglobin from her normal of 11 to 12-8 then 7.5 yesterday.  She subsequently called and told to come in for evaluation.  She states she has been generally fatigued.  She had some dark stools.  She has felt lightheaded and weak with ambulation.  She has had some "whooshing" in her ears, particular when she lays flat.  She saw her doctor and was started on Flonase for this but this has not helped.  Denies any current chest pain.  She does feel generally weak.  Denies any palpitations.     Physical Exam   Triage Vital Signs: ED Triage Vitals  Enc Vitals Group     BP 08/03/22 1428 (!) 149/46     Pulse Rate 08/03/22 1428 80     Resp 08/03/22 1428 16     Temp 08/03/22 1430 97.9 F (36.6 C)     Temp Source 08/03/22 1430 Oral     SpO2 08/03/22 1428 96 %     Weight 08/03/22 1429 111 lb 8.8 oz (50.6 kg)     Height 08/03/22 1429 '5\' 2"'$  (1.575 m)     Head Circumference --      Peak Flow --      Pain Score 08/03/22 1429 0     Pain Loc --      Pain Edu? --      Excl. in [place]? --     Most recent vital signs: Vitals:   08/03/22 1700 08/03/22 1710  BP:  (!) 167/50  Pulse: 78 78  Resp: 15 19  Temp:    SpO2: 95% 95%     General: Awake, no distress.  CV:  Good peripheral perfusion.  Regular rate and rhythm.  Harsh, 3 out of 6, systolic  murmur Resp:  Normal effort.  Slight tachypnea, bilateral rales noted. Abd:  No distention.  Other:  Pitting edema bilateral lower extremities.   ED Results / Procedures / Treatments   Labs (all labs ordered are listed, but only abnormal results are displayed) Labs Reviewed  COMPREHENSIVE METABOLIC PANEL - Abnormal; Notable for the following components:      Result Value   Sodium 134 (*)    Glucose, Bld 133 (*)    Creatinine, Ser 1.42 (*)    Total Protein 6.4 (*)    Albumin 3.4 (*)    GFR, Estimated 34 (*)    All other components within normal limits  CBC - Abnormal; Notable for the following components:   RBC 2.39 (*)    Hemoglobin 7.9 (*)    HCT 23.8 (*)    Platelets 130 (*)    All other components within normal limits  URINALYSIS, ROUTINE W REFLEX MICROSCOPIC - Abnormal; Notable for the following components:   Color, Urine YELLOW (*)    APPearance HAZY (*)    Protein, ur 100 (*)  All other components within normal limits  RETICULOCYTES - Abnormal; Notable for the following components:   RBC. 2.37 (*)    Immature Retic Fract 19.2 (*)    All other components within normal limits  BRAIN NATRIURETIC PEPTIDE - Abnormal; Notable for the following components:   B Natriuretic Peptide 708.7 (*)    All other components within normal limits  CBC WITH DIFFERENTIAL/PLATELET - Abnormal; Notable for the following components:   RBC 2.40 (*)    Hemoglobin 7.9 (*)    HCT 23.8 (*)    Platelets 128 (*)    All other components within normal limits  SARS CORONAVIRUS 2 BY RT PCR  LIPASE, BLOOD  TECHNOLOGIST SMEAR REVIEW  VITAMIN B12  IRON AND TIBC  FERRITIN  FOLATE  LACTATE DEHYDROGENASE  TYPE AND SCREEN  TROPONIN I (HIGH SENSITIVITY)  TROPONIN I (HIGH SENSITIVITY)     EKG Normal sinus rhythm, ventricular rate 81.  PR 158, QRS 96, QTc 439.  No acute ST elevations or depressions.  Subtle ST changes in the anterior precordial leads without reciprocal changes.   RADIOLOGY CT  abdomen/pelvis: Bibasilar effusions and atelectasis greater on the left.  Diverticulosis without diverticulitis   I also independently reviewed and agree with radiologist interpretations.   PROCEDURES:  Critical Care performed: No  .1-3 Lead EKG Interpretation  Performed by: Duffy Bruce, MD Authorized by: Duffy Bruce, MD     Interpretation: non-specific     ECG rate:  80-90   ECG rate assessment: normal     Rhythm: sinus rhythm     Ectopy: none     Conduction: normal   Comments:     Indication: SOB     MEDICATIONS ORDERED IN ED: Medications  iohexol (OMNIPAQUE) 300 MG/ML solution 75 mL (75 mLs Intravenous Contrast Given 08/03/22 1604)     IMPRESSION / MDM / ASSESSMENT AND PLAN / ED COURSE  I reviewed the triage vital signs and the nursing notes.                               The patient is on the cardiac monitor to evaluate for evidence of arrhythmia and/or significant heart rate changes.   Ddx:  Differential includes the following, with pertinent life- or limb-threatening emergencies considered:  CHF, A-fib/arrhythmia, angina, symptomatic anemia, renal failure with edema, deconditioning, polypharmacy  Patient's presentation is most consistent with acute presentation with potential threat to life or bodily function.  MDM:  88 year old female with past medical history of hypertension, right-sided heart failure, mitral and aortic stenosis, here with shortness of breath and generalized weakness.  Suspect multifactorial generalized weakness and shortness of breath.  Patient has what appears to be an acute on chronic anemia, likely due to GI losses as the patient is on Plavix and has Hemoccult positive stool.  She does have what sounds like aortic stenosis so query possible AVMs.  She does not appear to be significantly actively bleeding.  Otherwise, the patient also has signs of CHF with mild AKI, BNP of 708.  I suspect this is also multifactorial but possibly  worse by her anemia.  CT abdomen and pelvis obtained due to her right-sided abdominal pain and this shows bibasilar effusions but no evidence of acute intra-abdominal pathology.  Urinalysis shows fullness of the renal collecting ducts but she has no evidence of UTI.  We will start diuresis, anemia panel with trending of hemoglobin, admit to medicine.   MEDICATIONS  GIVEN IN ED: Medications  iohexol (OMNIPAQUE) 300 MG/ML solution 75 mL (75 mLs Intravenous Contrast Given 08/03/22 1604)     Consults:  Hospitalist   EMR reviewed  Reviewed prior echocardiogram, prior family/PCP visits with Dr. Derrel Nip including telephone note from today     FINAL CLINICAL IMPRESSION(S) / ED DIAGNOSES   Final diagnoses:  Symptomatic anemia  Acute on chronic right-sided congestive heart failure ([employer])     Rx / DC Orders   ED Discharge Orders     None        Note:  This document was prepared using Dragon voice recognition software and may include unintentional dictation errors.   Duffy Bruce, MD 08/03/22 [phone]

## 2022-08-03 NOTE — Telephone Encounter (Signed)
CRITICAL VALUE STICKER  CRITICAL VALUE:  Hemoglobin- 7.5 Hematocrit- 22.2  RECEIVER (on-site recipient of call):  DATE & TIME NOTIFIED: 08/03/22  MESSENGER (representative from lab):  MD NOTIFIED: Dr. Derrel Nip  TIME OF NOTIFICATION:  RESPONSE:

## 2022-08-03 NOTE — ED Notes (Signed)
Pt provided a denture cup for her hearing aids (pt label applied); provided oral swabs due to NPO order.

## 2022-08-03 NOTE — ED Triage Notes (Signed)
Pt here with a low blood count and RLQ abd pain. Pt was sent here from her providers office due to her recent labs.

## 2022-08-03 NOTE — Telephone Encounter (Signed)
Spoke with pt in regards to her lab results. Pt stated that she will call her daughter and see if she can take her. Pt stated that she would give [place] a call back when she found someone to take her.

## 2022-08-03 NOTE — ED Notes (Signed)
Pt placed on 2L of supplemental O2, the pt's O2 on RA was 90% with a good pleth. The pt's O2 is currently 98% and the pt is resting comfortably in no acute visible distress. Close monitoring continued.

## 2022-08-03 NOTE — Telephone Encounter (Signed)
Patient's hemoglobin has dropped another 1/2 point since last week,  I would like her to go to the ER for urgent evaluation  which may result in a blood transfusion if they find a source.  They may do a rectal exam

## 2022-08-04 DIAGNOSIS — D649 Anemia, unspecified: Secondary | ICD-10-CM

## 2022-08-04 DIAGNOSIS — D638 Anemia in other chronic diseases classified elsewhere: Secondary | ICD-10-CM

## 2022-08-04 DIAGNOSIS — I5033 Acute on chronic diastolic (congestive) heart failure: Secondary | ICD-10-CM | POA: Diagnosis not present

## 2022-08-04 LAB — PROTEIN ELECTROPHORESIS, SERUM
Abnormal Protein Band1: 0.4 g/dL — ABNORMAL HIGH
Albumin ELP: 3.2 g/dL — ABNORMAL LOW (ref 3.8–4.8)
Alpha 1: 0.4 g/dL — ABNORMAL HIGH (ref 0.2–0.3)
Alpha 2: 0.7 g/dL (ref 0.5–0.9)
Beta 2: 0.2 g/dL (ref 0.2–0.5)
Beta Globulin: 0.6 g/dL (ref 0.4–0.6)
Gamma Globulin: 0.4 g/dL — ABNORMAL LOW (ref 0.8–1.7)
Total Protein: 5.5 g/dL — ABNORMAL LOW (ref 6.1–8.1)

## 2022-08-04 LAB — BASIC METABOLIC PANEL
Anion gap: 10 (ref 5–15)
BUN: 19 mg/dL (ref 8–23)
CO2: 21 mmol/L — ABNORMAL LOW (ref 22–32)
Calcium: 8.6 mg/dL — ABNORMAL LOW (ref 8.9–10.3)
Chloride: 104 mmol/L (ref 98–111)
Creatinine, Ser: 1.33 mg/dL — ABNORMAL HIGH (ref 0.44–1.00)
GFR, Estimated: 37 mL/min — ABNORMAL LOW (ref >60)
Glucose, Bld: 104 mg/dL — ABNORMAL HIGH (ref 70–99)
Potassium: 3.9 mmol/L (ref 3.5–5.1)
Sodium: 135 mmol/L (ref 135–145)

## 2022-08-04 LAB — IFE AND PE, RANDOM URINE
% BETA, Urine: 94.6 %
ALBUMIN, U: 2.6 %
ALPHA 1 URINE: 0.2 %
ALPHA-2-GLOBULIN, U: 1.7 %
GAMMA GLOBULIN URINE: 1 %
M-SPIKE, %: 89.9 % — ABNORMAL HIGH
Protein, Ur: 801.9 mg/dL

## 2022-08-04 LAB — CBC
HCT: 22.6 % — ABNORMAL LOW (ref 36.0–46.0)
Hemoglobin: 7.7 g/dL — ABNORMAL LOW (ref 12.0–15.0)
MCH: 33.2 pg (ref 26.0–34.0)
MCHC: 34.1 g/dL (ref 30.0–36.0)
MCV: 97.4 fL (ref 80.0–100.0)
Platelets: 125 10*3/uL — ABNORMAL LOW (ref 150–400)
RBC: 2.32 MIL/uL — ABNORMAL LOW (ref 3.87–5.11)
RDW: 15.5 % (ref 11.5–15.5)
WBC: 6.2 10*3/uL (ref 4.0–10.5)
nRBC: 0 % (ref 0.0–0.2)

## 2022-08-04 LAB — TROPONIN I (HIGH SENSITIVITY): Troponin I (High Sensitivity): 20 ng/L — ABNORMAL HIGH (ref <18)

## 2022-08-04 LAB — ERYTHROPOIETIN: Erythropoietin: 166.3 m[IU]/mL — ABNORMAL HIGH (ref 2.6–18.5)

## 2022-08-04 MED ORDER — FLUTICASONE PROPIONATE 50 MCG/ACT NA SUSP
2.0000 | Freq: Every day | NASAL | Status: DC
  Administered 2022-08-04 – 2022-08-10 (×4): 2 via NASAL
  Filled 2022-08-04: qty 16

## 2022-08-04 MED ORDER — ACETAMINOPHEN 650 MG RE SUPP: 650.0000 mg | Freq: Four times a day (QID) | RECTAL | Status: DC | PRN

## 2022-08-04 MED ORDER — FUROSEMIDE 10 MG/ML IJ SOLN
20.0000 mg | Freq: Two times a day (BID) | INTRAMUSCULAR | Status: DC
  Administered 2022-08-04 (×3): 20 mg via INTRAVENOUS
  Filled 2022-08-04: qty 2
  Filled 2022-08-04: qty 4
  Filled 2022-08-04: qty 2

## 2022-08-04 MED ORDER — DORZOLAMIDE HCL 2 % OP SOLN
1.0000 [drp] | Freq: Three times a day (TID) | OPHTHALMIC | Status: DC
  Administered 2022-08-04 – 2022-08-11 (×22): 1 [drp] via OPHTHALMIC
  Filled 2022-08-04: qty 10

## 2022-08-04 MED ORDER — ONDANSETRON HCL 4 MG PO TABS: 4.0000 mg | ORAL_TABLET | Freq: Four times a day (QID) | ORAL | Status: DC | PRN

## 2022-08-04 MED ORDER — CLONIDINE HCL 0.1 MG PO TABS
0.1000 mg | ORAL_TABLET | Freq: Two times a day (BID) | ORAL | Status: DC
  Administered 2022-08-04 – 2022-08-11 (×11): 0.1 mg via ORAL
  Filled 2022-08-04 (×12): qty 1

## 2022-08-04 MED ORDER — LEVOTHYROXINE SODIUM 50 MCG PO TABS
75.0000 ug | ORAL_TABLET | Freq: Every day | ORAL | Status: DC
  Administered 2022-08-04 – 2022-08-11 (×7): 75 ug via ORAL
  Filled 2022-08-04 (×7): qty 1

## 2022-08-04 MED ORDER — METOPROLOL TARTRATE 25 MG PO TABS
12.5000 mg | ORAL_TABLET | Freq: Two times a day (BID) | ORAL | Status: DC
  Administered 2022-08-04 – 2022-08-11 (×11): 12.5 mg via ORAL
  Filled 2022-08-04 (×12): qty 1

## 2022-08-04 MED ORDER — ACETAMINOPHEN 325 MG PO TABS
650.0000 mg | ORAL_TABLET | Freq: Four times a day (QID) | ORAL | Status: DC | PRN
  Administered 2022-08-04 – 2022-08-05 (×2): 650 mg via ORAL
  Administered 2022-08-05: 325 mg via ORAL
  Administered 2022-08-06 – 2022-08-10 (×5): 650 mg via ORAL
  Filled 2022-08-04 (×8): qty 2

## 2022-08-04 MED ORDER — ONDANSETRON HCL 4 MG/2ML IJ SOLN: 4.0000 mg | Freq: Four times a day (QID) | INTRAMUSCULAR | Status: DC | PRN

## 2022-08-04 MED ORDER — LOSARTAN POTASSIUM 50 MG PO TABS
100.0000 mg | ORAL_TABLET | Freq: Every day | ORAL | Status: DC
  Administered 2022-08-04 – 2022-08-11 (×7): 100 mg via ORAL
  Filled 2022-08-04 (×7): qty 2

## 2022-08-04 NOTE — Consult Note (Signed)
Hematology/Oncology Consult note [facility] [phone] [phone]  Patient Care Team: Crecencio Mc, MD as PCP - General (Internal Medicine) Minna Merritts, MD as PCP - Cardiology (Cardiology) Corey Skains, MD as Consulting Physician (Cardiology)   Name of the patient: Deanna White  [id number]  Jul 14, 1925    Reason for consult: Normocytic anemia   Requesting physician: Dr. Priscella Mann  Date of visit: 08/04/2022    History of presenting illness-patient is a 88 old female with a past medical history significant for hypertension hypothyroidism And his history of aortic stenosis mild to moderate.  She was admitted to the hospital for shortness of breath with exertion and fatigue.  Labs reveal sudden worsening of anemia.  At baseline patient's hemoglobin up until January 2023 was noted to be 12.7.  It dropped down to 11.1 in March 2023 followed by 8.1 on 07/28/2022 and presently is between 7.5-8.  White cell count has been normal with a normal differential between 4-6.  Platelet count was mildly low at 140 in January 2023 and presently at 125.  Iron studies were checked which on 08/02/2022 which showed a low transferrin of 170.  Ferritin elevated at 763 and TIBC low at 238.  EPO level was 166.  B12 levels were elevated at greater than 1500.  CMP shows low total protein of 6.4.  Creatinine mildly elevated at 1.4.  Serum calcium was normal.  Reticulocyte count low for the degree of anemia at 2.  Smear review was otherwise unremarkable other than mixed RBC population.  LDH mildly elevated at 261.  Stool occult was positive and therefore GI was consulted although there has been no evidence of overt GI bleed.  ECOG PS- 2  Pain scale- 0   Review of systems- Review of Systems  Constitutional:  Positive for malaise/fatigue. Negative for chills, fever and weight loss.  HENT:  Negative for congestion, ear discharge and nosebleeds.   Eyes:  Negative  for blurred vision.  Respiratory:  Negative for cough, hemoptysis, sputum production, shortness of breath and wheezing.   Cardiovascular:  Negative for chest pain, palpitations, orthopnea and claudication.  Gastrointestinal:  Negative for abdominal pain, blood in stool, constipation, diarrhea, heartburn, melena, nausea and vomiting.  Genitourinary:  Negative for dysuria, flank pain, frequency, hematuria and urgency.  Musculoskeletal:  Negative for back pain, joint pain and myalgias.  Skin:  Negative for rash.  Neurological:  Negative for dizziness, tingling, focal weakness, seizures, weakness and headaches.  Endo/Heme/Allergies:  Does not bruise/bleed easily.  Psychiatric/Behavioral:  Negative for depression and suicidal ideas. The patient does not have insomnia.     Allergies  Allergen Reactions   Hctz [Hydrochlorothiazide] Other (See Comments)    Hyponatremia    Adhesive [Tape] Rash    Peels skin   Brimonidine Rash and Other (See Comments)    Pt felt as if throat was closing up, pt also had rash    Patient Active Problem List   Diagnosis Date Noted   Symptomatic anemia 08/03/2022   Aortic stenosis 08/03/2022   Iron deficiency anemia due to chronic blood loss 08/03/2022   Acute on chronic diastolic CHF (congestive heart failure) ([place]) 08/03/2022   Eustachian tube dysfunction, bilateral 07/30/2022   Osteoarthritis 07/30/2022   Wood splinter in palm of hand 01/22/2022   B12 deficiency 01/22/2022   Cough in adult 12/30/2021   Myalgia due to statin 07/01/2021   Pain due to onychomycosis of toenails of both feet 12/09/2020   Combined forms  of age-related cataract of right eye 08/11/2020   Emphysema, unspecified ([place]) 06/10/2020   Aortic atherosclerosis ([place]) 05/12/2020   Chronic pain of left ankle 04/19/2020   Edema of left lower extremity 04/19/2020   Varicella zoster 09/24/2019   Trichiasis of right upper eyelid 11/06/2018   Venous insufficiency of both lower extremities  04/17/2018   Near syncope 01/26/2018   Pseudophakia, left eye 01/16/2018   LVH (left ventricular hypertrophy) due to hypertensive disease, without heart failure 12/03/2017   Moderate mitral insufficiency 04/17/2017   Constipation 03/03/2017   Mixed hyperlipidemia 03/27/2016   Medicare annual wellness visit, subsequent 03/03/2016   Encounter for preventive health examination 03/03/2016   Age-related reticular degeneration of both retinas 12/22/2015   Nonexudative age-related macular degeneration 12/22/2015   Open angle with borderline findings and low glaucoma risk in both eyes 12/22/2015   Bilateral ocular hypertension 12/22/2015   Optic neuropathy, left 12/22/2015   B12 deficiency anemia 02/25/2015   Hypothyroidism 02/23/2015   Cataract (lens) fragments in eye following cataract surgery, left eye 01/20/2015   Senile osteoporosis 02/22/2014   Osteoporosis of vertebra 02/22/2014   Benign essential hypertension 02/20/2014   PAD (peripheral artery disease) ([place]) 09/15/2011     Past Medical History:  Diagnosis Date   Actinic keratosis    Allergy    Anemia 02/25/2015   Basal cell carcinoma 06/01/2021   R temple - ED&C   Carotid artery occlusion    Colon polyps    Fall from slipping on ice 11/2013   Heart murmur    Hypertension    Menopause    Pneumonia    Positive TB test    pt denies   Sore throat    Squamous cell carcinoma of skin 04/17/2019   L post lateral ankle, shave removal 10/15/2019   Thyroid disease    Varicose veins      Past Surgical History:  Procedure Laterality Date   ABDOMINAL HYSTERECTOMY     APPENDECTOMY     BILATERAL SALPINGOOPHORECTOMY  1964   CESAREAN SECTION     EYE SURGERY Right March 2016   Cataract   LOWER EXTREMITY ANGIOGRAPHY Left 03/14/2019   Procedure: LOWER EXTREMITY ANGIOGRAPHY;  Surgeon: Katha Cabal, MD;  Location: [facility];  Service: Cardiovascular;  Laterality: Left;   TONSILLECTOMY     VEIN SURGERY Left    Leg     Social History   Socioeconomic History   Marital status: Widowed    Spouse name: Not on file   Number of children: Not on file   Years of education: Not on file   Highest education level: Not on file  Occupational History   Not on file  Tobacco Use   Smoking status: Never   Smokeless tobacco: Never  Substance and Sexual Activity   Alcohol use: No   Drug use: No   Sexual activity: Never  Other Topics Concern   Not on file  Social History Narrative   Not on file   Social Determinants of Health   Financial Resource Strain: Low Risk  (09/27/2020)   Overall Financial Resource Strain (CARDIA)    Difficulty of Paying Living Expenses: Not hard at all  Food Insecurity: No Food Insecurity (09/27/2020)   Hunger Vital Sign    Worried About Running Out of Food in the Last Year: Never true    Ran Out of Food in the Last Year: Never true  Transportation Needs: No Transportation Needs (09/27/2020)   PRAPARE - Transportation  Lack of Transportation (Medical): No    Lack of Transportation (Non-Medical): No  Physical Activity: Not on file  Stress: No Stress Concern Present (09/27/2020)   [place]    Feeling of Stress : Not at all  Social Connections: Unknown (09/27/2020)   Social Connection and Isolation Panel [NHANES]    Frequency of Communication with Friends and Family: More than three times a week    Frequency of Social Gatherings with Friends and Family: More than three times a week    Attends Religious Services: Not on file    Active Member of Clubs or Organizations: Not on file    Attends Archivist Meetings: Not on file    Marital Status: Not on file  Intimate Partner Violence: Not At Risk (09/27/2020)   Humiliation, Afraid, Rape, and Kick questionnaire    Fear of Current or Ex-Partner: No    Emotionally Abused: No    Physically Abused: No    Sexually Abused: No     Family History   Problem Relation Age of Onset   Heart disease Mother        After age 59   Hypertension Mother    Deep vein thrombosis Mother    Heart attack Mother    Kidney disease Father    Alcohol abuse Brother    Heart disease Brother    Cancer Brother        Lung cancer   Cancer Brother        kidney cancer   Varicose Veins Sister    Heart disease Sister        After age 42   Heart attack Sister    Heart disease Sister        After age 55   Other Other        epilepsy   Hypertension Other    Cancer Paternal Grandmother        breast cancer   Cancer Daughter      Current Facility-Administered Medications:    acetaminophen (TYLENOL) tablet 650 mg, 650 mg, Oral, Q6H PRN **OR** acetaminophen (TYLENOL) suppository 650 mg, 650 mg, Rectal, Q6H PRN, Athena Masse, MD   cloNIDine (CATAPRES) tablet 0.1 mg, 0.1 mg, Oral, BID, Sreenath, Sudheer B, MD, 0.1 mg at 08/04/22 1020   dorzolamide (TRUSOPT) 2 % ophthalmic solution 1 drop, 1 drop, Both Eyes, TID, Sreenath, Sudheer B, MD, 1 drop at 08/04/22 1029   fluticasone (FLONASE) 50 MCG/ACT nasal spray 2 spray, 2 spray, Each Nare, Daily, Sreenath, Sudheer B, MD, 2 spray at 08/04/22 1020   furosemide (LASIX) injection 20 mg, 20 mg, Intravenous, Q12H, Judd Gaudier V, MD, 20 mg at 08/04/22 1253   levothyroxine (SYNTHROID) tablet 75 mcg, 75 mcg, Oral, Q0600, Athena Masse, MD, 75 mcg at 08/04/22 0528   losartan (COZAAR) tablet 100 mg, 100 mg, Oral, Daily, Judd Gaudier V, MD, 100 mg at 08/04/22 1019   metoprolol tartrate (LOPRESSOR) tablet 12.5 mg, 12.5 mg, Oral, BID, End, Harrell Gave, MD   ondansetron (ZOFRAN) tablet 4 mg, 4 mg, Oral, Q6H PRN **OR** ondansetron (ZOFRAN) injection 4 mg, 4 mg, Intravenous, Q6H PRN, Athena Masse, MD   Physical exam:  Vitals:   08/04/22 0424 08/04/22 0428 08/04/22 0831 08/04/22 1213  BP: (!) 132/48  (!) 145/57 (!) 146/60  Pulse: 88 88 91 90  Resp: _0 Temp: 98.2 F (36.8 C)  98.2 F (36.8 C) 97.7 F  (  36.5 C)  TempSrc:      SpO2: (!) 86% 90% 92% 95%  Weight:      Height:       Physical Exam Constitutional:      Comments: Elderly frail woman in no acute distress  Cardiovascular:     Rate and Rhythm: Normal rate and regular rhythm.     Heart sounds: Normal heart sounds.  Pulmonary:     Effort: Pulmonary effort is normal.     Breath sounds: Normal breath sounds.  Abdominal:     General: Bowel sounds are normal.     Palpations: Abdomen is soft.  Lymphadenopathy:     Comments: No palpable cervical, supraclavicular, axillary or inguinal adenopathy    Skin:    General: Skin is warm and dry.  Neurological:     Mental Status: She is alert and oriented to person, place, and time.           Latest Ref Rng & Units 08/04/2022    5:35 AM  CMP  Glucose 70 - 99 mg/dL 104   BUN 8 - 23 mg/dL 19   Creatinine 0.44 - 1.00 mg/dL 1.33   Sodium 135 - 145 mmol/L 135   Potassium 3.5 - 5.1 mmol/L 3.9   Chloride 98 - 111 mmol/L 104   CO2 22 - 32 mmol/L 21   Calcium 8.9 - 10.3 mg/dL 8.6       Latest Ref Rng & Units 08/04/2022    5:35 AM  CBC  WBC 4.0 - 10.5 K/uL 6.2   Hemoglobin 12.0 - 15.0 g/dL 7.7   Hematocrit 36.0 - 46.0 % 22.6   Platelets 150 - 400 K/uL 125     _0 @  DG Chest Portable 1 View  Result Date: 08/03/2022 CLINICAL DATA:  Weakness EXAM: PORTABLE CHEST 1 VIEW COMPARISON:  Chest x-ray 05/05/2020 FINDINGS: Heart is enlarged. Right pleural effusion has resolved. Small left pleural effusion is unchanged. There is some minimal patchy opacities in the lung bases. Central interstitial prominence has increased. There is no pneumothorax. No acute fractures are seen. IMPRESSION: 1. Cardiomegaly with increasing interstitial edema. 2. Right pleural effusion has resolved. 3. Small left pleural effusion is unchanged. Electronically Signed   By: Ronney Asters M.D.   On: 08/03/2022 17:42   CT ABDOMEN PELVIS W CONTRAST  Result Date: 08/03/2022 CLINICAL DATA:  Anemia, RIGHT lower  quadrant abdominal pain, history hypertension TAHBSO, appendectomy EXAM: CT ABDOMEN AND PELVIS WITH CONTRAST TECHNIQUE: Multidetector CT imaging of the abdomen and pelvis was performed using the standard protocol following bolus administration of intravenous contrast. RADIATION DOSE REDUCTION: This exam was performed according to the departmental dose-optimization program which includes automated exposure control, adjustment of the mA and/or kV according to patient size and/or use of iterative reconstruction technique. CONTRAST:  73m OMNIPAQUE IOHEXOL 300 MG/ML SOLN IV. No oral contrast. COMPARISON:  None Available. FINDINGS: Lower chest: Bibasilar effusions and atelectasis greater on LEFT. Mild enlargement of cardiac chambers. Small hiatal hernia. Hepatobiliary: Gallbladder and liver normal appearance Pancreas: Normal appearance Spleen: Irregular spleen with calcifications question sequela of trauma or infarct. No mass. Adrenals/Urinary Tract: Adrenal glands unremarkable. Mild LEFT renal collecting system dilatation without obstructing calculus or ureteral dilatation. RIGHT kidney and RIGHT ureter unremarkable. Bladder normal appearance. Stomach/Bowel: Appendix surgically absent by history. Scattered stool throughout colon. Elongated J-shaped stomach with small hiatal hernia. Sigmoid diverticulosis. Remaining bowel loops unremarkable. Vascular/Lymphatic: Atherosclerotic calcifications aorta, iliac arteries, visceral arteries. Aorta normal caliber. Scattered pelvic phleboliths. No adenopathy. Reproductive:  Uterus and ovaries surgically absent Other: No free air or free fluid. No hernia or inflammatory process. Musculoskeletal: Osseous demineralization. Superior endplate compression deformity L1, unchanged since 05/05/2020. Trace LEFT hip joint effusion. No acute osseous findings. IMPRESSION: Bibasilar effusions and atelectasis greater on LEFT. Small hiatal hernia. Sigmoid diverticulosis without evidence of  diverticulitis. Mild LEFT renal collecting system dilatation without obstructing calculus or ureteral dilatation. No acute intra-abdominal or intrapelvic abnormalities. Aortic Atherosclerosis (ICD10-I70.0). Electronically Signed   By: Lavonia Dana M.D.   On: 08/03/2022 16:37   ECHOCARDIOGRAM COMPLETE  Result Date: 07/21/2022    ECHOCARDIOGRAM REPORT   Patient Name:   MIRAKLE TOMLIN Date of Exam: 07/21/2022 Medical Rec #:  [id number]     Height:       62.0 in Accession #:    [id number]    Weight:       115.5 lb Date of Birth:  1925/08/24    BSA:          1.514 m Patient Age:    27 years      BP:           120/6454 mmHg Patient Gender: F             HR:           78 bpm. Exam Location:  [place] Procedure: 2D Echo, Color Doppler and Cardiac Doppler Indications:    I48.0 Paroxysmal atrial fibrillation; R01.1 Murmur  History:        Patient has no prior history of Echocardiogram examinations.                 LVH, PAD and COPD, Arrythmias:Atrial Fibrillation,                 Signs/Symptoms:Murmur and Dizziness/Lightheadedness; Risk                 Factors:Dyslipidemia, Non-Smoker and Hypertension.  Sonographer:    Pilar Jarvis RDMS, RVT, RDCS Referring Phys: [id number] Rise Mu  Sonographer Comments: Image acquisition challenging due to respiratory motion. Very limited acoustic windows IMPRESSIONS  1. Left ventricular ejection fraction, by estimation, is 60 to 65%. The left ventricle has normal function. The left ventricle has no regional wall motion abnormalities. There is moderate left ventricular hypertrophy. Left ventricular diastolic parameters are consistent with Grade II diastolic dysfunction (pseudonormalization).  2. Right ventricular systolic function is normal. The right ventricular size is normal. There is severely elevated pulmonary artery systolic pressure. The estimated right ventricular systolic pressure is 24.4 mmHg.  3. The mitral valve is normal in structure. Mild to moderate mitral valve  regurgitation. Moderate mitral stenosis by estimated mean mitral valve gradient is 9.5 mmHg. There does not appear to be significant stenosis by estimated MVA (3.67 cm sq) and visually appears to open well. Moderate mitral annular calcification.  4. Tricuspid valve regurgitation is moderate.  5. The aortic valve has an indeterminant number of cusps. There is moderate calcification of the aortic valve. Aortic valve regurgitation is not visualized. Mild to moderate aortic valve stenosis. Aortic valve area, by VTI measures 1.26 cm. Aortic valve mean gradient measures 12.8 mmHg. Aortic valve Vmax measures 2.39 m/s.  6. The inferior vena cava is normal in size with greater than 50% respiratory variability, suggesting right atrial pressure of 3 mmHg.  7. Rhythm is normal sinus. FINDINGS  Left Ventricle: Left ventricular ejection fraction, by estimation, is 60 to 65%. The left ventricle has normal function. The left ventricle has no regional wall  motion abnormalities. The left ventricular internal cavity size was normal in size. There is  moderate left ventricular hypertrophy. Left ventricular diastolic parameters are consistent with Grade II diastolic dysfunction (pseudonormalization). Right Ventricle: The right ventricular size is normal. No increase in right ventricular wall thickness. Right ventricular systolic function is normal. There is severely elevated pulmonary artery systolic pressure. The tricuspid regurgitant velocity is 3.84 m/s, and with an assumed right atrial pressure of 5 mmHg, the estimated right ventricular systolic pressure is 83.1 mmHg. Left Atrium: Left atrial size was normal in size. Right Atrium: Right atrial size was normal in size. Pericardium: There is no evidence of pericardial effusion. Mitral Valve: The mitral valve is normal in structure. There is mild calcification of the mitral valve leaflet(s). Moderate mitral annular calcification. Mild to moderate mitral valve regurgitation. Moderate  mitral valve stenosis. MV peak gradient, 20.2 mmHg. The mean mitral valve gradient is 9.5 mmHg. Tricuspid Valve: The tricuspid valve is normal in structure. Tricuspid valve regurgitation is moderate . No evidence of tricuspid stenosis. Aortic Valve: The aortic valve has an indeterminant number of cusps. There is moderate calcification of the aortic valve. Aortic valve regurgitation is not visualized. Mild to moderate aortic stenosis is present. Aortic valve mean gradient measures 12.8 mmHg. Aortic valve peak gradient measures 22.8 mmHg. Aortic valve area, by VTI measures 1.26 cm. Pulmonic Valve: The pulmonic valve was normal in structure. Pulmonic valve regurgitation is trivial. No evidence of pulmonic stenosis. Aorta: The aortic root is normal in size and structure. Venous: The inferior vena cava is normal in size with greater than 50% respiratory variability, suggesting right atrial pressure of 3 mmHg. IAS/Shunts: No atrial level shunt detected by color flow Doppler.  LEFT VENTRICLE PLAX 2D LVIDd:         3.50 cm     Diastology LVIDs:         1.70 cm     LV e' medial:    5.11 cm/s LV PW:         1.20 cm     LV E/e' medial:  38.2 LV IVS:        1.40 cm     LV e' lateral:   4.13 cm/s LVOT diam:     1.70 cm     LV E/e' lateral: 47.2 LV SV:         70 LV SV Index:   46 LVOT Area:     2.27 cm  LV Volumes (MOD) LV vol d, MOD A4C: 64.8 ml LV vol s, MOD A4C: 24.7 ml LV SV MOD A4C:     64.8 ml RIGHT VENTRICLE             IVC RV S prime:     16.20 cm/s  IVC diam: 2.50 cm TAPSE (M-mode): 2.5 cm LEFT ATRIUM             Index LA diam:        3.60 cm 2.38 cm/m LA Vol (A2C):   67.3 ml 44.46 ml/m LA Vol (A4C):   48.0 ml 31.71 ml/m LA Biplane Vol: 57.4 ml 37.92 ml/m  AORTIC VALVE                     PULMONIC VALVE AV Area (Vmax):    1.31 cm      PV Vmax:          1.06 m/s AV Area (Vmean):   1.29 cm      PV Peak  grad:     4.5 mmHg AV Area (VTI):     1.26 cm      PR End Diast Vel: 5.48 msec AV Vmax:           239.00 cm/s AV  Vmean:          164.250 cm/s AV VTI:            0.558 m AV Peak Grad:      22.8 mmHg AV Mean Grad:      12.8 mmHg LVOT Vmax:         138.00 cm/s LVOT Vmean:        93.200 cm/s LVOT VTI:          0.310 m LVOT/AV VTI ratio: 0.56  AORTA Ao Root diam: 2.80 cm Ao Asc diam:  3.30 cm MITRAL VALVE                TRICUSPID VALVE MV Area (PHT): 3.72 cm     TR Peak grad:   59.0 mmHg MV Area VTI:   1.16 cm     TR Vmax:        384.00 cm/s MV Peak grad:  20.2 mmHg MV Mean grad:  9.5 mmHg     SHUNTS MV Vmax:       2.24 m/s     Systemic VTI:  0.31 m MV Vmean:      142.0 cm/s   Systemic Diam: 1.70 cm MV Decel Time: 204 msec MV E velocity: 195.00 cm/s MV A velocity: 181.00 cm/s MV E/A ratio:  1.08 Ida Rogue MD Electronically signed by Ida Rogue MD Signature Date/Time: 07/21/2022/5:39:22 PM    Final     Assessment and plan- Patient is a 88 y.o. female admitted for fatigue and generalized weakness.  Hematology consulted for anemia.  Patient's hemoglobin has dropped suddenly from 11-8 between March to October 2023.  No overt evidence of bleeding.  Iron studies are not indicated of iron deficiency.  B12 and folate are normal.  Reticulocyte count is low for the degree of anemia indicative of hypoproliferative anemia.  Mild AKI with a serum creatinine of 1.4.  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  I will plan to get myeloma panel and repeat serum free light chains at this time.  Given the significant anemia without a clear cause I would recommend getting a bone marrow biopsy to rule out conditions like MDS or acute leukemia which can be seen at her age.  We cannot get this done until Monday.  This can be done as an inpatient or outpatient.   Visit Diagnosis 1. Symptomatic anemia   2. Acute on chronic right-sided congestive heart failure ([place])     Dr. Randa Evens, MD, MPH [facility] at [facility] [phone] 08/04/2022

## 2022-08-04 NOTE — Assessment & Plan Note (Signed)
Will hold amlodipine and Catapres Continue losartan and Lasix

## 2022-08-04 NOTE — Assessment & Plan Note (Signed)
Echocardiogram on 10/20 showed aortic stenosis

## 2022-08-04 NOTE — Consult Note (Signed)
   Heart Failure Nurse Navigator Note  HFpEF 60 to 65%.  Grade 2 diastolic dysfunction.  Moderate LVH.  Mild to moderate mitral regurgitation.  Moderate tricuspid regurgitation.  Mild to moderate aortic stenosis.  She presented to the emergency room due to a drop in her hemoglobin.  She was also noted to have a BNP of 708 and x-ray with cardiomegaly and increasing interstitial edema with small left pleural effusion.  Comorbidities:  Pretension Hypothyroidism Aortic stenosis Anemia  Medications:  Catapres 0.1 mg 2 times a day Furosemide 20 mg IV every 12 hours Levothyroxine 75 mcg daily Losartan 100 mg daily  Labs:  Sodium 135, potassium 3.9, chloride 104, CO2 21, BUN 19, creatinine 1.33, GFR 37,  hemoglobin 7.7 and hematocrit 22 6. 850.6 kg Blood pressure 146/60   Initial meeting with patient and her son Max was at the bedside.  She states that she has heard the term heart failure before but not in regards to herself.  Discussed the type of heart failure that she has.  Patient lives by herself and fixes most of her own meals.  She is vegetarian.  She does not add salt.  Discussed reading labels and making healthy choices.  Also discussed fluid restriction of no more than 64 ounces daily and what constitutes a liquid.  She states that she drinks a cup of coffee in the morning 6 ounces, then will have a glass of water with her medications and then will drink a glass of tea with dinner and supper.   Data aware of follow-up in the outpatient heart failure clinic for which she has an appointment on November 10 at 10:30 in the morning.  She was given the living with heart failure teaching booklet, zone magnet, info on low-sodium and heart failure along with weight chart.  Pricilla Riffle RN [facility]

## 2022-08-04 NOTE — Progress Notes (Addendum)
Hospitalist update note.  This is a nonbillable note.  Please see same-day H&P for full billable details.  Briefly, this is a pleasant 88 year old female who was sent to the emergency room by her primary care physician due to worsening anemia of unknown etiology.  Patient had a drop in hemoglobin from 12 to mid sevens.  On presentation there was also concern for mild exacerbation of patient's preserved ejection fraction heart failure.  She is placed on gentle IV diuresis per cardiology.  No plans for ischemic evaluation or echocardiogram.  Patient has hypoxia as well, currently on 3-4L nasal cannula.  Likely due to fluid overload/decompensated HF  Patient also has symptomatic anemia.  Guaiac positive stool in the ED.  Folate, B12 within normal limits.  Iron indices seem to indicate anemia of inflammation rather than true iron deficiency but patient may benefit from IV iron.  Will await GI recommendations and evaluation.  Patient may warrant endoscopy during this admission.  Patient is on Plavix.  I believe this is part of her treatment for peripheral arterial disease.  Initial care plan discussed with patient and son at bedside.  Ralene Muskrat MD  No charge

## 2022-08-04 NOTE — Care Management CC44 (Signed)
Condition Code 44 Documentation Completed  Patient Details  Name: Deanna White MRN: [id number] Date of Birth: [birth year]   Condition Code 44 given:  Yes Patient signature on Condition Code 44 notice:  Yes Documentation of 2 MD's agreement:  Yes Code 44 added to claim:  Yes    Candie Chroman, LCSW 08/04/2022, 4:29 PM

## 2022-08-04 NOTE — Care Management Obs Status (Signed)
[facility] NOTIFICATION   Patient Details  Name: Deanna White MRN: [id number] Date of Birth: [birth year]   Medicare Observation Status Notification Given:  Yes    Candie Chroman, LCSW 08/04/2022, 4:29 PM

## 2022-08-04 NOTE — Assessment & Plan Note (Addendum)
Probably mild exacerbation secondary to symptomatic anemia Does not appear clinically overloaded BNP elevated to 700, chest x-ray shows increasing interstitial edema.  CT chest showing bibasilar effusions We will treat with low-dose Lasix We will continue home meds to include Cozaar and furosemide Daily weights with intake and output monitoring Echo on 10/20 showed aortic stenosis Cardiology consulted for additional recommendations

## 2022-08-04 NOTE — Assessment & Plan Note (Addendum)
Chronic GI blood loss suspected Serial H&H and transfuse if necessary Stool guaiac done in the ED was positive GI consulted

## 2022-08-04 NOTE — Consult Note (Signed)
Cephas Darby, MD [address]  [place]  [place], [place] [address]  Main: [phone]  Fax: [phone] Pager: [phone]   Consultation  Referring Provider:     No ref. provider found Primary Care Physician:  Crecencio Mc, MD Primary Gastroenterologist: Althia Forts     Reason for Consultation: Symptomatic anemia  Date of Admission:  08/03/2022 Date of Consultation:  08/04/2022         HPI:   Deanna White is a 88 y.o. female with anemia of chronic disease presented with severe symptomatic anemia.  She does have history of chronic thrombocytopenia dating back to 05/2014. History of SVT/atrial tachycardia, peripheral artery disease s/p angioplasty in 2020, carotid artery stenosis, aortic atherosclerosis, MR, hypertension, hypothyroidism, hyperlipidemia.  She was evaluated by her PCP on 10/27 at 6 months follow-up, found to have hemoglobin of 8.1, repeat hemoglobin on 11/1 was 7.5, therefore she was recommended to go to the emergency room for further evaluation.  Patient reports several weeks history of progressively worsening weakness, exertional dyspnea, fatigue and lightheadedness.  She does report intermittent dark stools which she thought was secondary to the type of food she consumed, denies any abdominal pain, constipation, diarrhea, rectal bleeding, nausea or vomiting.  She denies severe weight loss, loss of appetite Troponins are mildly elevated along with BNP and chest x-ray revealed increased interstitial edema with bibasilar effusions, therefore cardiology is consulted for possible heart failure. GI is consulted for evaluation of anemia and stool guaiac was positive in the ER Patient's son is bedside.  Patient lives alone, independent of ADLs Patient did not receive any blood transfusion or iron infusion since admission Patient does not smoke or drink alcohol  NSAIDs: None  Antiplts/Anticoagulants/Anti thrombotics: Plavix for history of peripheral artery  disease, last dose was 11/2  GI Procedures: Colonoscopy in 2010, reportedly normal  Past Medical History:  Diagnosis Date   Actinic keratosis    Allergy    Anemia 02/25/2015   Basal cell carcinoma 06/01/2021   R temple - ED&C   Carotid artery occlusion    Colon polyps    Fall from slipping on ice 11/2013   Heart murmur    Hypertension    Menopause    Pneumonia    Positive TB test    pt denies   Sore throat    Squamous cell carcinoma of skin 04/17/2019   L post lateral ankle, shave removal 10/15/2019   Thyroid disease    Varicose veins     Past Surgical History:  Procedure Laterality Date   ABDOMINAL HYSTERECTOMY     APPENDECTOMY     BILATERAL SALPINGOOPHORECTOMY  1964   CESAREAN SECTION     EYE SURGERY Right March 2016   Cataract   LOWER EXTREMITY ANGIOGRAPHY Left 03/14/2019   Procedure: LOWER EXTREMITY ANGIOGRAPHY;  Surgeon: Katha Cabal, MD;  Location: [facility];  Service: Cardiovascular;  Laterality: Left;   TONSILLECTOMY     VEIN SURGERY Left    Leg     Current Facility-Administered Medications:    acetaminophen (TYLENOL) tablet 650 mg, 650 mg, Oral, Q6H PRN **OR** acetaminophen (TYLENOL) suppository 650 mg, 650 mg, Rectal, Q6H PRN, Athena Masse, MD   cloNIDine (CATAPRES) tablet 0.1 mg, 0.1 mg, Oral, BID, Sreenath, Sudheer B, MD, 0.1 mg at 08/04/22 1020   dorzolamide (TRUSOPT) 2 % ophthalmic solution 1 drop, 1 drop, Both Eyes, TID, Sreenath, Sudheer B, MD, 1 drop at 08/04/22 1618   fluticasone (  FLONASE) 50 MCG/ACT nasal spray 2 spray, 2 spray, Each Nare, Daily, Sreenath, Sudheer B, MD, 2 spray at 08/04/22 1020   furosemide (LASIX) injection 20 mg, 20 mg, Intravenous, Q12H, Athena Masse, MD, 20 mg at 08/04/22 1253   levothyroxine (SYNTHROID) tablet 75 mcg, 75 mcg, Oral, Q0600, Athena Masse, MD, 75 mcg at 08/04/22 0528   losartan (COZAAR) tablet 100 mg, 100 mg, Oral, Daily, Judd Gaudier V, MD, 100 mg at 08/04/22 1019   metoprolol tartrate  (LOPRESSOR) tablet 12.5 mg, 12.5 mg, Oral, BID, End, Christopher, MD, 12.5 mg at 08/04/22 1617   ondansetron (ZOFRAN) tablet 4 mg, 4 mg, Oral, Q6H PRN **OR** ondansetron (ZOFRAN) injection 4 mg, 4 mg, Intravenous, Q6H PRN, Athena Masse, MD   Family History  Problem Relation Age of Onset   Heart disease Mother        After age 43   Hypertension Mother    Deep vein thrombosis Mother    Heart attack Mother    Kidney disease Father    Alcohol abuse Brother    Heart disease Brother    Cancer Brother        Lung cancer   Cancer Brother        kidney cancer   Varicose Veins Sister    Heart disease Sister        After age 87   Heart attack Sister    Heart disease Sister        After age 60   Other Other        epilepsy   Hypertension Other    Cancer Paternal Grandmother        breast cancer   Cancer Daughter      Social History   Tobacco Use   Smoking status: Never   Smokeless tobacco: Never  Substance Use Topics   Alcohol use: No   Drug use: No    Allergies as of 08/03/2022 - Review Complete 08/03/2022  Allergen Reaction Noted   Hctz [hydrochlorothiazide] Other (See Comments) 06/09/2020   Adhesive [tape] Rash 08/18/2011   Brimonidine Rash and Other (See Comments) 10/15/2015    Review of Systems:    All systems reviewed and negative except where noted in HPI.   Physical Exam:  Vital signs in last 24 hours: Temp:  [97.6 F (36.4 C)-98.2 F (36.8 C)] 98 F (36.7 C) (11/03 1619) Pulse Rate:  [78-91] 90 (11/03 1619) Resp:  [15-25] 19 (11/03 1619) BP: (132-174)/(48-74) 174/74 (11/03 1619) SpO2:  [86 %-97 %] 97 % (11/03 1619) Last BM Date : 08/04/22 General:   Pleasant, cooperative in NAD Head:  Normocephalic and atraumatic. Eyes:   No icterus.   Conjunctiva pale. PERRLA. Ears:  Normal auditory acuity. Neck:  Supple; no masses or thyroidomegaly Lungs: Respirations even and unlabored. Lungs clear to auscultation bilaterally.   No wheezes, crackles, or rhonchi.   Heart:  Regular rate and rhythm;  Without murmur, clicks, rubs or gallops Abdomen:  Soft, nondistended, nontender. Normal bowel sounds. No appreciable masses or hepatomegaly.  No rebound or guarding.  Rectal:  Not performed. Msk:  Symmetrical without gross deformities.  Strength generalized weakness Extremities:  Without edema, cyanosis or clubbing. Neurologic:  Alert and oriented x3;  grossly normal neurologically. Skin:  Intact without significant lesions or rashes. Psych:  Alert and cooperative. Normal affect.  LAB RESULTS:    Latest Ref Rng & Units 08/04/2022    5:35 AM 08/03/2022    2:39 PM 08/02/2022  2:03 PM  CBC  WBC 4.0 - 10.5 K/uL 6.2  4.8    4.9  4.4   Hemoglobin 12.0 - 15.0 g/dL 7.7  7.9    7.9  7.5 Repeated and verified X2.   Hematocrit 36.0 - 46.0 % 22.6  23.8    23.8  22.2 Repeated and verified X2.   Platelets 150 - 400 K/uL 125  130    128  118.0     BMET    Latest Ref Rng & Units 08/04/2022    5:35 AM 08/03/2022    2:39 PM 12/29/2021   10:08 AM  BMP  Glucose 70 - 99 mg/dL 104  133  81   BUN 8 - 23 mg/dL _0 Creatinine 0.44 - 1.00 mg/dL 1.33  1.42  0.97   Sodium 135 - 145 mmol/L 135  134  137   Potassium 3.5 - 5.1 mmol/L 3.9  4.0  4.3   Chloride 98 - 111 mmol/L 104  104  104   CO2 22 - 32 mmol/L _1 Calcium 8.9 - 10.3 mg/dL 8.6  8.9  8.9     LFT    Latest Ref Rng & Units 08/03/2022    2:39 PM 08/02/2022    2:03 PM 06/19/2022   10:23 AM  Hepatic Function  Total Protein 6.5 - 8.1 g/dL 6.4  5.5  6.3   Albumin 3.5 - 5.0 g/dL 3.4   3.7   AST 15 - 41 U/L 29   22   ALT 0 - 44 U/L 12   10   Alk Phosphatase 38 - 126 U/L 93   75   Total Bilirubin 0.3 - 1.2 mg/dL 0.5   0.7   Bilirubin, Direct 0.0 - 0.2 mg/dL   <0.1      STUDIES: DG Chest Portable 1 View  Result Date: 08/03/2022 CLINICAL DATA:  Weakness EXAM: PORTABLE CHEST 1 VIEW COMPARISON:  Chest x-ray 05/05/2020 FINDINGS: Heart is enlarged. Right pleural effusion has resolved. Small  left pleural effusion is unchanged. There is some minimal patchy opacities in the lung bases. Central interstitial prominence has increased. There is no pneumothorax. No acute fractures are seen. IMPRESSION: 1. Cardiomegaly with increasing interstitial edema. 2. Right pleural effusion has resolved. 3. Small left pleural effusion is unchanged. Electronically Signed   By: Ronney Asters M.D.   On: 08/03/2022 17:42   CT ABDOMEN PELVIS W CONTRAST  Result Date: 08/03/2022 CLINICAL DATA:  Anemia, RIGHT lower quadrant abdominal pain, history hypertension TAHBSO, appendectomy EXAM: CT ABDOMEN AND PELVIS WITH CONTRAST TECHNIQUE: Multidetector CT imaging of the abdomen and pelvis was performed using the standard protocol following bolus administration of intravenous contrast. RADIATION DOSE REDUCTION: This exam was performed according to the departmental dose-optimization program which includes automated exposure control, adjustment of the mA and/or kV according to patient size and/or use of iterative reconstruction technique. CONTRAST:  57m OMNIPAQUE IOHEXOL 300 MG/ML SOLN IV. No oral contrast. COMPARISON:  None Available. FINDINGS: Lower chest: Bibasilar effusions and atelectasis greater on LEFT. Mild enlargement of cardiac chambers. Small hiatal hernia. Hepatobiliary: Gallbladder and liver normal appearance Pancreas: Normal appearance Spleen: Irregular spleen with calcifications question sequela of trauma or infarct. No mass. Adrenals/Urinary Tract: Adrenal glands unremarkable. Mild LEFT renal collecting system dilatation without obstructing calculus or ureteral dilatation. RIGHT kidney and RIGHT ureter unremarkable. Bladder normal appearance. Stomach/Bowel: Appendix surgically absent by history. Scattered stool throughout colon. Elongated J-shaped stomach with  small hiatal hernia. Sigmoid diverticulosis. Remaining bowel loops unremarkable. Vascular/Lymphatic: Atherosclerotic calcifications aorta, iliac arteries,  visceral arteries. Aorta normal caliber. Scattered pelvic phleboliths. No adenopathy. Reproductive: Uterus and ovaries surgically absent Other: No free air or free fluid. No hernia or inflammatory process. Musculoskeletal: Osseous demineralization. Superior endplate compression deformity L1, unchanged since 05/05/2020. Trace LEFT hip joint effusion. No acute osseous findings. IMPRESSION: Bibasilar effusions and atelectasis greater on LEFT. Small hiatal hernia. Sigmoid diverticulosis without evidence of diverticulitis. Mild LEFT renal collecting system dilatation without obstructing calculus or ureteral dilatation. No acute intra-abdominal or intrapelvic abnormalities. Aortic Atherosclerosis (ICD10-I70.0). Electronically Signed   By: Lavonia Dana M.D.   On: 08/03/2022 16:37      Impression / Plan:   Deanna White is a 88 y.o. female with history of peripheral artery disease s/p angioplasty on Plavix, SVT/atrial tachycardia, chronic thrombocytopenia of unclear etiology is admitted with severe symptomatic anemia over the span of 6 months and CHF, ?  Intermittent dark stools, stool guaiac positive in the ER  Anemia: Iron studies are consistent with anemia of chronic disease There is no evidence of active bleeding other than ?  Intermittent dark stools as reported by patient Discussed with patient regarding the possibility of endoscopic evaluation including upper endoscopy and colonoscopy when she is off Plavix for 72 hours versus watchful waiting and follow-up as outpatient.  Given that patient is functionally independent of ADLs and stool guaiac positive in the ER upon arrival, it is not unreasonable to proceed with endoscopic evaluation.  Will discuss with patient again tomorrow regarding the timing of GI procedures inpatient versus outpatient pending hematologic work-up I also recommend hematologic evaluation given history of chronic thrombocytopenia and anemia of chronic disease to evaluate for any  underlying hematologic condition. Continue to hold Plavix for now  Thank you for involving me in the care of this patient.  GI will follow along with you    LOS: 1 day   Sherri Sear, MD  08/04/2022, 4:24 PM    Note: This dictation was prepared with Dragon dictation along with smaller phrase technology. Any transcriptional errors that result from this process are unintentional.

## 2022-08-04 NOTE — H&P (Signed)
History and Physical    Patient: Deanna White ZOX:[id number] DOB: 1925/05/18 DOA: 08/03/2022 DOS: the patient was seen and examined on 08/04/2022 PCP: Deanna Mc, White  Patient coming from: Home  Chief Complaint:  Chief Complaint  Patient presents with   Abdominal Pain    HPI: Deanna White is a 88 y.o. female with medical history significant for SVT/atrial tachycardia, HTN, hypothyroidism, last seen by her cardiologist on 9/18, with echocardiogram on 07/21/2022 that showed aortic stenosis and grade 2 diastolic who presents to the ED with a several week history of progressively worsening generalized weakness, shortness of breath with exertion, lightheadedness and fatigue.  She was evaluated by her PCP who noted a decrease in her hemoglobin from around 12 down to 7.5 and she was sent into the ED for evaluation.  She reports dark stool but denies vomiting.  She denies chest pain, abdominal pain, cough fever or chills.  Denies orthopnea or lower extremity edema ED course and data review: BP 149/46 with otherwise normal vitals.  Labs significant for troponin 15 BNP 708.  WBC 4900, hemoglobin 7.9, platelets 1 30,000, COVID and flu negative. EKG, personally viewed and interpreted showing NSR at 81 with nonspecific ST-T wave changes. Chest x-ray showed cardiomegaly with increasing interstitial edema. CT abdomen and pelvis showing the following nonacute findings: MPRESSION: Bibasilar effusions and atelectasis greater on LEFT.   Small hiatal hernia.   Sigmoid diverticulosis without evidence of diverticulitis.   Mild LEFT renal collecting system dilatation without obstructing calculus or ureteral dilatation.   No acute intra-abdominal or intrapelvic abnormalitie  Left ventricular ejection fraction, by estimation, is 60 to 65%. The  left ventricle has normal function. The left ventricle has no regional  wall motion abnormalities. There is moderate left ventricular hypertrophy.  Left  ventricular diastolic  parameters are consistent with Grade II diastolic dysfunction  (pseudonormalization).    Stool guaiac was done in the ED and was positive.  Hospitalist consulted for admission.   Review of Systems: As mentioned in the history of present illness. All other systems reviewed and are negative.  Past Medical History:  Diagnosis Date   Actinic keratosis    Allergy    Anemia 02/25/2015   Basal cell carcinoma 06/01/2021   R temple - ED&C   Carotid artery occlusion    Colon polyps    Fall from slipping on ice 11/2013   Heart murmur    Hypertension    Menopause    Pneumonia    Positive TB test    pt denies   Sore throat    Squamous cell carcinoma of skin 04/17/2019   L post lateral ankle, shave removal 10/15/2019   Thyroid disease    Varicose veins    Past Surgical History:  Procedure Laterality Date   ABDOMINAL HYSTERECTOMY     APPENDECTOMY     BILATERAL SALPINGOOPHORECTOMY  1964   CESAREAN SECTION     EYE SURGERY Right March 2016   Cataract   LOWER EXTREMITY ANGIOGRAPHY Left 03/14/2019   Procedure: LOWER EXTREMITY ANGIOGRAPHY;  Surgeon: Deanna White;  Location: [facility];  Service: Cardiovascular;  Laterality: Left;   TONSILLECTOMY     VEIN SURGERY Left    Leg   Social History:  reports that she has never smoked. She has never used smokeless tobacco. She reports that she does not drink alcohol and does not use drugs.  Allergies  Allergen Reactions   Hctz [Hydrochlorothiazide] Other (See Comments)  Hyponatremia    Adhesive [Tape] Rash    Peels skin   Brimonidine Rash and Other (See Comments)    Pt felt as if throat was closing up, pt also had rash    Family History  Problem Relation Age of Onset   Heart disease Mother        After age 84   Hypertension Mother    Deep vein thrombosis Mother    Heart attack Mother    Kidney disease Father    Alcohol abuse Brother    Heart disease Brother    Cancer Brother         Lung cancer   Cancer Brother        kidney cancer   Varicose Veins Sister    Heart disease Sister        After age 43   Heart attack Sister    Heart disease Sister        After age 73   Other Other        epilepsy   Hypertension Other    Cancer Paternal Grandmother        breast cancer   Cancer Daughter     Prior to Admission medications   Medication Sig Start Date End Date Taking? Authorizing Provider  amLODipine (NORVASC) 5 MG tablet Take 0.5 tab by mouth every AM, and take 1 tab  by mouth every PM 06/19/22  Yes Deanna White  cloNIDine (CATAPRES) 0.1 MG tablet 1 tablet in the am and 2 in the evening Patient taking differently: 1 tablet in the am and 1 In the evening 12/29/21  Yes Deanna Mc, White  clopidogrel (PLAVIX) 75 MG tablet TAKE 1 TABLET BY MOUTH EVERY DAY 07/12/22  Yes Deanna White  dorzolamide (TRUSOPT) 2 % ophthalmic solution Place 1 drop into both eyes 3 (three) times daily.  07/18/16  Yes Provider, Historical, White  levothyroxine (SYNTHROID) 75 MCG tablet TAKE 1 TABLET BY MOUTH EVERY DAY 07/03/22  Yes Deanna Mc, White  losartan (COZAAR) 100 MG tablet TAKE 1 TABLET BY MOUTH EVERY DAY 01/24/22  Yes Deanna Mc, White  Multiple Vitamins-Minerals (PRESERVISION AREDS 2) CAPS Take 1 capsule by mouth 2 (two) times daily.    Yes Provider, Historical, White  cyanocobalamin (,VITAMIN B-12,) 1000 MCG/ML injection Inject 1 mL (1,000 mcg total) into the muscle every 30 (thirty) days. 02/13/22   Deanna Mc, White  fluticasone (FLONASE) 50 MCG/ACT nasal spray Place 2 sprays into both nostrils daily. 07/28/22   Deanna Mc, White  furosemide (LASIX) 20 MG tablet Take 1 tablet (20 mg total) by mouth as needed (As needed for shortness of breath or weight gain.). As needed for swellling 07/27/22   Deanna White  ketoconazole (NIZORAL) 2 % cream Apply 1 Application topically at bedtime. 06/01/22 05/21/24  Deanna Bathe, White  NEEDLE, DISP, 25 G 25G X 1" MISC Use to give b12  injection once monthly. 02/13/22   Deanna Mc, White    Physical Exam: Vitals:   08/03/22 1430 08/03/22 1700 08/03/22 1710 08/03/22 1800  BP:   (!) 167/50 (!) 163/62  Pulse:  78 78 84  Resp:  15 19 (!) 21  Temp: 97.9 F (36.6 C)   98 F (36.7 C)  TempSrc: Oral   Oral  SpO2:  95% 95% 91%  Weight:      Height:       Physical Exam Vitals and nursing note  reviewed.  Constitutional:      General: She is not in acute distress. HENT:     Head: Normocephalic and atraumatic.  Cardiovascular:     Rate and Rhythm: Normal rate and regular rhythm.     Heart sounds: Normal heart sounds.  Pulmonary:     Effort: Pulmonary effort is normal.     Breath sounds: Normal breath sounds.  Abdominal:     Palpations: Abdomen is soft.     Tenderness: There is no abdominal tenderness.  Neurological:     Mental Status: Mental status is at baseline.     Labs on Admission: I have personally reviewed following labs and imaging studies  CBC: Recent Labs  Lab 07/28/22 1218 08/02/22 1403 08/03/22 1439  WBC 4.3 4.4 4.9  4.8  NEUTROABS 2.7 3.2 3.4  HGB 8.1 Repeated and verified X2.* 7.5 Repeated and verified X2.* 7.9*  7.9*  HCT 24.0 Repeated and verified X2.* 22.2 Repeated and verified X2.* 23.8*  23.8*  MCV 98.9 98.8 99.2  99.6  PLT 130.0* 118.0* 128*  510*   Basic Metabolic Panel: Recent Labs  Lab 08/03/22 1439  NA 134*  K 4.0  CL 104  CO2 23  GLUCOSE 133*  BUN 22  CREATININE 1.42*  CALCIUM 8.9   GFR: Estimated Creatinine Clearance: 19.2 mL/min (A) (by C-G formula based on SCr of 1.42 mg/dL (H)). Liver Function Tests: Recent Labs  Lab 08/03/22 1439  AST 29  ALT 12  ALKPHOS 93  BILITOT 0.5  PROT 6.4*  ALBUMIN 3.4*   Recent Labs  Lab 08/03/22 1439  LIPASE 40   No results for input(s): "AMMONIA" in the last 168 hours. Coagulation Profile: No results for input(s): "INR", "PROTIME" in the last 168 hours. Cardiac Enzymes: No results for input(s): "CKTOTAL",  "CKMB", "CKMBINDEX", "TROPONINI" in the last 168 hours. BNP (last 3 results) No results for input(s): "PROBNP" in the last 8760 hours. HbA1C: No results for input(s): "HGBA1C" in the last 72 hours. CBG: No results for input(s): "GLUCAP" in the last 168 hours. Lipid Profile: No results for input(s): "CHOL", "HDL", "LDLCALC", "TRIG", "CHOLHDL", "LDLDIRECT" in the last 72 hours. Thyroid Function Tests: No results for input(s): "TSH", "T4TOTAL", "FREET4", "T3FREE", "THYROIDAB" in the last 72 hours. Anemia Panel: Recent Labs    08/02/22 1402 08/02/22 1403 08/03/22 1439 08/03/22 1707  VITAMINB12  --   --   --  1,218*  FOLATE  --   --  35.0  --   FERRITIN  --  763.5* 846*  --   TIBC  --  238.0* 241*  --   IRON  --  54 55  --   RETICCTPCT 1.6  --  2.0  --    Urine analysis:    Component Value Date/Time   COLORURINE YELLOW (A) 08/03/2022 1430   APPEARANCEUR HAZY (A) 08/03/2022 1430   LABSPEC 1.021 08/03/2022 1430   PHURINE 5.0 08/03/2022 1430   GLUCOSEU NEGATIVE 08/03/2022 1430   HGBUR NEGATIVE 08/03/2022 1430   BILIRUBINUR NEGATIVE 08/03/2022 1430   KETONESUR NEGATIVE 08/03/2022 1430   PROTEINUR 100 (A) 08/03/2022 1430   NITRITE NEGATIVE 08/03/2022 1430   LEUKOCYTESUR NEGATIVE 08/03/2022 1430    Radiological Exams on Admission: DG Chest Portable 1 View  Result Date: 08/03/2022 CLINICAL DATA:  Weakness EXAM: PORTABLE CHEST 1 VIEW COMPARISON:  Chest x-ray 05/05/2020 FINDINGS: Heart is enlarged. Right pleural effusion has resolved. Small left pleural effusion is unchanged. There is some minimal patchy opacities in the lung bases.  Central interstitial prominence has increased. There is no pneumothorax. No acute fractures are seen. IMPRESSION: 1. Cardiomegaly with increasing interstitial edema. 2. Right pleural effusion has resolved. 3. Small left pleural effusion is unchanged. Electronically Signed   By: Ronney Asters M.D.   On: 08/03/2022 17:42   CT ABDOMEN PELVIS W  CONTRAST  Result Date: 08/03/2022 CLINICAL DATA:  Anemia, RIGHT lower quadrant abdominal pain, history hypertension TAHBSO, appendectomy EXAM: CT ABDOMEN AND PELVIS WITH CONTRAST TECHNIQUE: Multidetector CT imaging of the abdomen and pelvis was performed using the White protocol following bolus administration of intravenous contrast. RADIATION DOSE REDUCTION: This exam was performed according to the departmental dose-optimization program which includes automated exposure control, adjustment of the mA and/or kV according to patient size and/or use of iterative reconstruction technique. CONTRAST:  56m OMNIPAQUE IOHEXOL 300 MG/ML SOLN IV. No oral contrast. COMPARISON:  None Available. FINDINGS: Lower chest: Bibasilar effusions and atelectasis greater on LEFT. Mild enlargement of cardiac chambers. Small hiatal hernia. Hepatobiliary: Gallbladder and liver normal appearance Pancreas: Normal appearance Spleen: Irregular spleen with calcifications question sequela of trauma or infarct. No mass. Adrenals/Urinary Tract: Adrenal glands unremarkable. Mild LEFT renal collecting system dilatation without obstructing calculus or ureteral dilatation. RIGHT kidney and RIGHT ureter unremarkable. Bladder normal appearance. Stomach/Bowel: Appendix surgically absent by history. Scattered stool throughout colon. Elongated J-shaped stomach with small hiatal hernia. Sigmoid diverticulosis. Remaining bowel loops unremarkable. Vascular/Lymphatic: Atherosclerotic calcifications aorta, iliac arteries, visceral arteries. Aorta normal caliber. Scattered pelvic phleboliths. No adenopathy. Reproductive: Uterus and ovaries surgically absent Other: No free air or free fluid. No hernia or inflammatory process. Musculoskeletal: Osseous demineralization. Superior endplate compression deformity L1, unchanged since 05/05/2020. Trace LEFT hip joint effusion. No acute osseous findings. IMPRESSION: Bibasilar effusions and atelectasis greater on LEFT.  Small hiatal hernia. Sigmoid diverticulosis without evidence of diverticulitis. Mild LEFT renal collecting system dilatation without obstructing calculus or ureteral dilatation. No acute intra-abdominal or intrapelvic abnormalities. Aortic Atherosclerosis (ICD10-I70.0). Electronically Signed   By: MLavonia DanaM.D.   On: 08/03/2022 16:37     Data Reviewed: Relevant notes from primary care and specialist visits, past discharge summaries as available in EHR, including Care Everywhere. Prior diagnostic testing as pertinent to current admission diagnoses Updated medications and problem lists for reconciliation ED course, including vitals, labs, imaging, treatment and response to treatment Triage notes, nursing and pharmacy notes and ED provider's notes Notable results as noted in HPI   Assessment and Plan: * Symptomatic anemia Chronic GI blood loss suspected Serial H&H and transfuse if necessary Stool guaiac done in the ED was positive GI consulted  Acute on chronic diastolic CHF (congestive heart failure) (HCC) Probably mild exacerbation secondary to symptomatic anemia Does not appear clinically overloaded BNP elevated to 700, chest x-ray shows increasing interstitial edema.  CT chest showing bibasilar effusions We will treat with low-dose Lasix We will continue home meds to include Cozaar and furosemide Daily weights with intake and output monitoring Echo on 10/20 showed aortic stenosis Cardiology consulted for additional recommendations  Aortic stenosis Echocardiogram on 10/20 showed aortic stenosis  Hypothyroidism Continue levothyroxine  Benign essential hypertension Will hold amlodipine and Catapres Continue losartan and Lasix        DVT prophylaxis: SCDs  Consults: GI Dr. AVicente Malesand cardiologist, Dr. MBenjamine Mola Advance Care Planning:   Code Status: Prior   Family Communication: Daughter at bedside  Disposition Plan: Back to previous home environment  Severity of  Illness: The appropriate patient status for this patient is INPATIENT. Inpatient status  is judged to be reasonable and necessary in order to provide the required intensity of service to ensure the patient's safety. The patient's presenting symptoms, physical exam findings, and initial radiographic and laboratory data in the context of their chronic comorbidities is felt to place them at high risk for further clinical deterioration. Furthermore, it is not anticipated that the patient will be medically stable for discharge from the hospital within 2 midnights of admission.   * I certify that at the point of admission it is my clinical judgment that the patient will require inpatient hospital care spanning beyond 2 midnights from the point of admission due to high intensity of service, high risk for further deterioration and high frequency of surveillance required.*  Author: Athena Masse, White 08/04/2022 12:04 AM  For on call review [email or web address].

## 2022-08-04 NOTE — Consult Note (Signed)
Cardiology Consultation:   Patient ID: [name]; [id number]; Aug 02, 1925   Admit date: 08/03/2022 Date of Consult: 08/04/2022  Primary Care Provider: Crecencio Mc, MD Primary Cardiologist: Rockey Situ Primary Electrophysiologist:  None   Patient Profile:   Deanna White is a 88 y.o. female with a hx of SVT/atrial tachycardia, PAD status post angioplasty of the SFA and popliteal arteries in 03/2019 followed by vascular surgery with most recent ABI from 10/2021 unchanged and normal, carotid artery stenosis, aortic atherosclerosis, mitral regurgitation, difficult to control HTN with normal renal artery ultrasound in 11/2021, HLD, hypothyroidism, and anemia who is being seen today for the evaluation of HFpEF in the setting of symptomatic anemia at the request of Dr. Damita Dunnings.  History of Present Illness:   Ms. Hankins was previously followed by [facility] cardiology.  Prior Holter monitor in 2017, performed through [facility] cardiology, reportedly showed SVT/A-fib with results not available for review.  Documentation indicates the patient had short runs of SVT, "but it was labeled atrial fibrillation."  Stress test in 2017 unavailable for review.  Echo from 2019 demonstrated an EF of 55%, moderate LVH, normal RV systolic function, mild mitral regurgitation, and a sclerotic aortic valve.  She established with Dr. Rockey Situ in 2019.  Repeat Holter in 2019 showed runs of atrial tachycardia.  She has previously had fatigue on metoprolol.  Most recent carotid artery ultrasound from 04/2021 showed bilateral 1 to 39% ICA stenoses with bilateral antegrade flow of the vertebral arteries and normal flow hemodynamics within the bilateral subclavian arteries.  She was last seen in the office in 06/2022 and was without symptoms of angina or decompensation.  Echo 07/21/2022 showed an EF of 60-65%, normal wall motion, moderate LVH, Gr2DD, normal RVSF and ventricular cavity size, PASP 64 mmHg, mild to moderate mitral  regurgitation, moderate mitral stenosis, moderate mitral annular calcification, moderate tricuspid regurgitation, mild to moderate aortic stenosis, and an estimated right atrial pressure of 3 mmHg.    Since then, she has been followed by her PCP for down trending Hgb from 11.1 in 11/2021 to 8.1 in 10/27 trending to 7.5 on 11/1. In this setting, she was advised to come to the ED for further evaluation of her anemia. Repeat Hgb in the ED 7.9 trending to 7.7. Guaiac positive in the ED. BNP 708. HS-Tn 15 with a delta troponin 20.  SCr 1.42 trending to 1.33. CXR with cardiomegaly and increasing interstitial edema with resolved right pleural effusion and an unchanged small left pleural effusion. CT of the abdomen/pelvis with bibasilar effusions greater on the left, small hiatal hernia, sigmoid diverticulosis, mild left renal collecting system dilatation and aortic atherosclerosis. She has received IV Lasix 20 mg. Upon admission, GI and cardiology were consulted. Currently, notes an improvement in her dyspnea. No chest pain, dizziness, presyncope, or syncope. She has remained active leading up to her admission despite feeling fatigued.    Past Medical History:  Diagnosis Date   Actinic keratosis    Allergy    Anemia 02/25/2015   Basal cell carcinoma 06/01/2021   R temple - ED&C   Carotid artery occlusion    Colon polyps    Fall from slipping on ice 11/2013   Heart murmur    Hypertension    Menopause    Pneumonia    Positive TB test    pt denies   Sore throat    Squamous cell carcinoma of skin 04/17/2019   L post lateral ankle, shave removal 10/15/2019   Thyroid disease  Varicose veins     Past Surgical History:  Procedure Laterality Date   ABDOMINAL HYSTERECTOMY     APPENDECTOMY     BILATERAL SALPINGOOPHORECTOMY  1964   CESAREAN SECTION     EYE SURGERY Right March 2016   Cataract   LOWER EXTREMITY ANGIOGRAPHY Left 03/14/2019   Procedure: LOWER EXTREMITY ANGIOGRAPHY;  Surgeon: Katha Cabal, MD;  Location: [place] [facility];  Service: Cardiovascular;  Laterality: Left;   TONSILLECTOMY     VEIN SURGERY Left    Leg     Home Meds: Prior to Admission medications   Medication Sig Start Date End Date Taking? Authorizing Provider  amLODipine (NORVASC) 5 MG tablet Take 0.5 tab by mouth every AM, and take 1 tab  by mouth every PM 06/19/22  Yes Almendra Loria, Areta Haber, PA-C  cloNIDine (CATAPRES) 0.1 MG tablet 1 tablet in the am and 2 in the evening Patient taking differently: 1 tablet in the am and 1 In the evening 12/29/21  Yes Crecencio Mc, MD  clopidogrel (PLAVIX) 75 MG tablet TAKE 1 TABLET BY MOUTH EVERY DAY 07/12/22  Yes Kris Hartmann, NP  dorzolamide (TRUSOPT) 2 % ophthalmic solution Place 1 drop into both eyes 3 (three) times daily.  07/18/16  Yes [provider]  levothyroxine (SYNTHROID) 75 MCG tablet TAKE 1 TABLET BY MOUTH EVERY DAY 07/03/22  Yes Crecencio Mc, MD  losartan (COZAAR) 100 MG tablet TAKE 1 TABLET BY MOUTH EVERY DAY 01/24/22  Yes Crecencio Mc, MD  Multiple Vitamins-Minerals (PRESERVISION AREDS 2) CAPS Take 1 capsule by mouth 2 (two) times daily.    Yes [provider]  cyanocobalamin (,VITAMIN B-12,) 1000 MCG/ML injection Inject 1 mL (1,000 mcg total) into the muscle every 30 (thirty) days. 02/13/22   Crecencio Mc, MD  fluticasone (FLONASE) 50 MCG/ACT nasal spray Place 2 sprays into both nostrils daily. 07/28/22   Crecencio Mc, MD  furosemide (LASIX) 20 MG tablet Take 1 tablet (20 mg total) by mouth as needed (As needed for shortness of breath or weight gain.). As needed for swellling 07/27/22   Gusta Marksberry, Areta Haber, PA-C  ketoconazole (NIZORAL) 2 % cream Apply 1 Application topically at bedtime. 06/01/22 05/21/24  Ralene Bathe, MD  NEEDLE, DISP, 25 G 25G X 1" MISC Use to give b12 injection once monthly. 02/13/22   Crecencio Mc, MD    Inpatient Medications: Scheduled Meds:  cloNIDine  0.1 mg Oral BID   dorzolamide  1 drop Both Eyes TID    fluticasone  2 spray Each Nare Daily   furosemide  20 mg Intravenous Q12H   levothyroxine  75 mcg Oral Q0600   losartan  100 mg Oral Daily   Continuous Infusions:  PRN Meds: acetaminophen **OR** acetaminophen, ondansetron **OR** ondansetron (ZOFRAN) IV  Allergies:   Allergies  Allergen Reactions   Hctz [Hydrochlorothiazide] Other (See Comments)    Hyponatremia    Adhesive [Tape] Rash    Peels skin   Brimonidine Rash and Other (See Comments)    Pt felt as if throat was closing up, pt also had rash    Social History:   Social History   Socioeconomic History   Marital status: Widowed    Spouse name: Not on file   Number of children: Not on file   Years of education: Not on file   Highest education level: Not on file  Occupational History   Not on file  Tobacco Use   Smoking  status: Never   Smokeless tobacco: Never  Substance and Sexual Activity   Alcohol use: No   Drug use: No   Sexual activity: Never  Other Topics Concern   Not on file  Social History Narrative   Not on file   Social Determinants of Health   Financial Resource Strain: Low Risk  (09/27/2020)   Overall Financial Resource Strain (CARDIA)    Difficulty of Paying Living Expenses: Not hard at all  Food Insecurity: No Food Insecurity (09/27/2020)   Hunger Vital Sign    Worried About Running Out of Food in the Last Year: Never true    Ran Out of Food in the Last Year: Never true  Transportation Needs: No Transportation Needs (09/27/2020)   PRAPARE - Hydrologist (Medical): No    Lack of Transportation (Non-Medical): No  Physical Activity: Not on file  Stress: No Stress Concern Present (09/27/2020)   [place]    Feeling of Stress : Not at all  Social Connections: Unknown (09/27/2020)   Social Connection and Isolation Panel [NHANES]    Frequency of Communication with Friends and Family: More than  three times a week    Frequency of Social Gatherings with Friends and Family: More than three times a week    Attends Religious Services: Not on file    Active Member of Clubs or Organizations: Not on file    Attends Archivist Meetings: Not on file    Marital Status: Not on file  Intimate Partner Violence: Not At Risk (09/27/2020)   Humiliation, Afraid, Rape, and Kick questionnaire    Fear of Current or Ex-Partner: No    Emotionally Abused: No    Physically Abused: No    Sexually Abused: No     Family History:   Family History  Problem Relation Age of Onset   Heart disease Mother        After age 19   Hypertension Mother    Deep vein thrombosis Mother    Heart attack Mother    Kidney disease Father    Alcohol abuse Brother    Heart disease Brother    Cancer Brother        Lung cancer   Cancer Brother        kidney cancer   Varicose Veins Sister    Heart disease Sister        After age 50   Heart attack Sister    Heart disease Sister        After age 32   Other Other        epilepsy   Hypertension Other    Cancer Paternal Grandmother        breast cancer   Cancer Daughter     ROS:  Review of Systems  Constitutional:  Positive for malaise/fatigue. Negative for chills, diaphoresis, fever and weight loss.  HENT:  Negative for congestion.   Eyes:  Negative for discharge and redness.  Respiratory:  Positive for shortness of breath. Negative for cough, hemoptysis, sputum production and wheezing.   Cardiovascular:  Negative for chest pain, palpitations, orthopnea, claudication, leg swelling and PND.  Gastrointestinal:  Positive for melena. Negative for abdominal pain, blood in stool, constipation, diarrhea, heartburn, nausea and vomiting.  Genitourinary:  Negative for hematuria.  Musculoskeletal:  Negative for falls and myalgias.  Skin:  Negative for rash.  Neurological:  Positive for weakness. Negative for dizziness, tingling, tremors,  sensory change, speech  change, focal weakness and loss of consciousness.  Endo/Heme/Allergies:  Does not bruise/bleed easily.  Psychiatric/Behavioral:  Negative for substance abuse. The patient is not nervous/anxious.   All other systems reviewed and are negative.     Physical Exam/Data:   Vitals:   08/04/22 0213 08/04/22 0224 08/04/22 0424 08/04/22 0428  BP: (!) 160/53  (!) 132/48   Pulse: 87 87 88 88  Resp: 18  18   Temp: 97.6 F (36.4 C)  98.2 F (36.8 C)   TempSrc:      SpO2:  90% (!) 86% 90%  Weight:      Height:       No intake or output data in the 24 hours ending 08/04/22 0805 Filed Weights   08/03/22 1429  Weight: 50.6 kg   Body mass index is 20.4 kg/m.   Physical Exam: General: Well developed, well nourished, in no acute distress. Head: Normocephalic, atraumatic, sclera non-icteric, no xanthomas, nares without discharge.  Neck: Negative for carotid bruits. JVD not elevated. Lungs: Diminished breath sounds along the bases bilaterally. Breathing is unlabored. Heart: RRR with S1 S2. II/VI systolic murmur RUSB, no rubs, or gallops appreciated. Abdomen: Soft, non-tender, non-distended with normoactive bowel sounds. No hepatomegaly. No rebound/guarding. No obvious abdominal masses. Msk:  Strength and tone appear normal for age. Extremities: No clubbing or cyanosis. No edema. Distal pedal pulses are 2+ and equal bilaterally. Neuro: Alert and oriented X 3. No facial asymmetry. No focal deficit. Moves all extremities spontaneously. Psych:  Responds to questions appropriately with a normal affect.   EKG:  The EKG was personally reviewed and demonstrates: NSR, 81 bpm, baseline wandering, no acute st/t changes Telemetry:  Telemetry was personally reviewed and demonstrates: SR with PACs and PVCs  Weights: Filed Weights   08/03/22 1429  Weight: 50.6 kg    Relevant CV Studies:  2D echo 07/21/2022: 1. Left ventricular ejection fraction, by estimation, is 60 to 65%. The  left ventricle has  normal function. The left ventricle has no regional  wall motion abnormalities. There is moderate left ventricular hypertrophy.  Left ventricular diastolic  parameters are consistent with Grade II diastolic dysfunction  (pseudonormalization).   2. Right ventricular systolic function is normal. The right ventricular  size is normal. There is severely elevated pulmonary artery systolic  pressure. The estimated right ventricular systolic pressure is 54.0 mmHg.   3. The mitral valve is normal in structure. Mild to moderate mitral valve  regurgitation. Moderate mitral stenosis by estimated mean mitral valve  gradient is 9.5 mmHg. There does not appear to be significant stenosis by  estimated MVA (3.67 cm sq) and  visually appears to open well. Moderate mitral annular calcification.   4. Tricuspid valve regurgitation is moderate.   5. The aortic valve has an indeterminant number of cusps. There is  moderate calcification of the aortic valve. Aortic valve regurgitation is  not visualized. Mild to moderate aortic valve stenosis. Aortic valve area,  by VTI measures 1.26 cm. Aortic  valve mean gradient measures 12.8 mmHg. Aortic valve Vmax measures 2.39  m/s.   6. The inferior vena cava is normal in size with greater than 50%  respiratory variability, suggesting right atrial pressure of 3 mmHg.   7. Rhythm is normal sinus.  __________  2D echo 12/03/2017 Jefm Bryant): INTERPRETATION  NORMAL LEFT VENTRICULAR SYSTOLIC FUNCTION   WITH MODERATE LVH  NORMAL RIGHT VENTRICULAR SYSTOLIC FUNCTION  MILD VALVULAR REGURGITATION (See above)  NO VALVULAR STENOSIS  MILD  TO MODERATE MR  MILD TR, PR  MILD PHTN  SCLEROTIC AoV  EF >55%  __________   2D echo 04/13/2016 Jefm Bryant): INTERPRETATION  NORMAL LEFT VENTRICULAR SYSTOLIC FUNCTION WITH AN ESTIMATED EF = 65 %  NORMAL RIGHT VENTRICULAR SYSTOLIC FUNCTION  MODERATE MITRAL VALVE INSUFFICIENCY  MILD-TO-MODERATE TRICUSPID VALVE INSUFFICIENCY  NO VALVULAR  STENOSIS  MILD LA ENLARGEMENT  MILD LVH  SCLEROTIC AORTIC VALVE   Laboratory Data:  Chemistry Recent Labs  Lab 08/03/22 1439 08/04/22 0535  NA 134* 135  K 4.0 3.9  CL 104 104  CO2 23 21*  GLUCOSE 133* 104*  BUN 22 19  CREATININE 1.42* 1.33*  CALCIUM 8.9 8.6*  GFRNONAA 34* 37*  ANIONGAP 7 10    Recent Labs  Lab 08/03/22 1439  PROT 6.4*  ALBUMIN 3.4*  AST 29  ALT 12  ALKPHOS 93  BILITOT 0.5   Hematology Recent Labs  Lab 08/02/22 1403 08/03/22 1439 08/04/22 0535  WBC 4.4 4.9  4.8 6.2  RBC 2.24 Repeated and verified X2.* 2.40*  2.39*  2.37* 2.32*  HGB 7.5 Repeated and verified X2.* 7.9*  7.9* 7.7*  HCT 22.2 Repeated and verified X2.* 23.8*  23.8* 22.6*  MCV 98.8 99.2  99.6 97.4  MCH  --  32.9  33.1 33.2  MCHC 33.7 33.2  33.2 34.1  RDW 16.1* 15.4  15.5 15.5  PLT 118.0* 128*  130* 125*   Cardiac EnzymesNo results for input(s): "TROPONINI" in the last 168 hours. No results for input(s): "TROPIPOC" in the last 168 hours.  BNP Recent Labs  Lab 08/03/22 1439  BNP 708.7*    DDimer No results for input(s): "DDIMER" in the last 168 hours.  Radiology/Studies:  DG Chest Portable 1 View  Result Date: 08/03/2022 IMPRESSION: 1. Cardiomegaly with increasing interstitial edema. 2. Right pleural effusion has resolved. 3. Small left pleural effusion is unchanged. Electronically Signed   By: Ronney Asters M.D.   On: 08/03/2022 17:42   CT ABDOMEN PELVIS W CONTRAST  Result Date: 08/03/2022 IMPRESSION: Bibasilar effusions and atelectasis greater on LEFT. Small hiatal hernia. Sigmoid diverticulosis without evidence of diverticulitis. Mild LEFT renal collecting system dilatation without obstructing calculus or ureteral dilatation. No acute intra-abdominal or intrapelvic abnormalities. Aortic Atherosclerosis (ICD10-I70.0). Electronically Signed   By: Lavonia Dana M.D.   On: 08/03/2022 16:37    Assessment and Plan:   1. HFpEF: -Mild exacerbation in the setting of  symptomatic anemia -Gentle IV diuresis, particularly if blood products are given -No indication for echo (07/21/2022)  2. Elevated high sensitivity troponin: -No chest pain -Mildly elevated and felt to represent supply demand ischemia in the setting of symptomatic anemia with mild renal dysfunction -In the setting of no chest pain and symptomatic anemia, no plans for inpatient ischemic evaluation -No ASA or heparin gtt at this time with anemia  3. Mild to moderate aortic stenosis: -Outpatient follow up  4. Symptomatic anemia with guaiac positive stool: -Per IM/GI -[facility] Activity Status Index: difficult to assess though appears to be at least 4 METs (remains active) -She would be moderate risk for noncardiac procedure, though no further cardiac testing would reduce this risk -If endoscopy is needed, she may proceed at an overall moderate cardiac risk     For questions or updates, please contact [place] Please consult [email or web address] for contact info under Cardiology/STEMI.   Signed, Christell Faith, PA-C [place] Pager: [phone] 08/04/2022, 8:05 AM

## 2022-08-04 NOTE — Assessment & Plan Note (Signed)
Continue levothyroxine 

## 2022-08-05 DIAGNOSIS — D6959 Other secondary thrombocytopenia: Secondary | ICD-10-CM | POA: Diagnosis present

## 2022-08-05 DIAGNOSIS — K449 Diaphragmatic hernia without obstruction or gangrene: Secondary | ICD-10-CM | POA: Diagnosis present

## 2022-08-05 DIAGNOSIS — E782 Mixed hyperlipidemia: Secondary | ICD-10-CM | POA: Diagnosis present

## 2022-08-05 DIAGNOSIS — I5033 Acute on chronic diastolic (congestive) heart failure: Secondary | ICD-10-CM | POA: Diagnosis present

## 2022-08-05 DIAGNOSIS — I272 Pulmonary hypertension, unspecified: Secondary | ICD-10-CM | POA: Diagnosis present

## 2022-08-05 DIAGNOSIS — D638 Anemia in other chronic diseases classified elsewhere: Secondary | ICD-10-CM | POA: Diagnosis not present

## 2022-08-05 DIAGNOSIS — N179 Acute kidney failure, unspecified: Secondary | ICD-10-CM | POA: Diagnosis not present

## 2022-08-05 DIAGNOSIS — I083 Combined rheumatic disorders of mitral, aortic and tricuspid valves: Secondary | ICD-10-CM | POA: Diagnosis present

## 2022-08-05 DIAGNOSIS — E538 Deficiency of other specified B group vitamins: Secondary | ICD-10-CM | POA: Diagnosis present

## 2022-08-05 DIAGNOSIS — Z85828 Personal history of other malignant neoplasm of skin: Secondary | ICD-10-CM | POA: Diagnosis not present

## 2022-08-05 DIAGNOSIS — M810 Age-related osteoporosis without current pathological fracture: Secondary | ICD-10-CM | POA: Diagnosis present

## 2022-08-05 DIAGNOSIS — E039 Hypothyroidism, unspecified: Secondary | ICD-10-CM | POA: Diagnosis present

## 2022-08-05 DIAGNOSIS — Z1152 Encounter for screening for COVID-19: Secondary | ICD-10-CM | POA: Diagnosis not present

## 2022-08-05 DIAGNOSIS — Z66 Do not resuscitate: Secondary | ICD-10-CM | POA: Diagnosis not present

## 2022-08-05 DIAGNOSIS — I350 Nonrheumatic aortic (valve) stenosis: Secondary | ICD-10-CM | POA: Diagnosis not present

## 2022-08-05 DIAGNOSIS — I872 Venous insufficiency (chronic) (peripheral): Secondary | ICD-10-CM | POA: Diagnosis present

## 2022-08-05 DIAGNOSIS — I739 Peripheral vascular disease, unspecified: Secondary | ICD-10-CM | POA: Diagnosis present

## 2022-08-05 DIAGNOSIS — K5730 Diverticulosis of large intestine without perforation or abscess without bleeding: Secondary | ICD-10-CM | POA: Diagnosis present

## 2022-08-05 DIAGNOSIS — I110 Hypertensive heart disease with heart failure: Secondary | ICD-10-CM | POA: Diagnosis present

## 2022-08-05 DIAGNOSIS — I509 Heart failure, unspecified: Secondary | ICD-10-CM

## 2022-08-05 DIAGNOSIS — I1A0 Resistant hypertension: Secondary | ICD-10-CM | POA: Diagnosis present

## 2022-08-05 DIAGNOSIS — I2489 Other forms of acute ischemic heart disease: Secondary | ICD-10-CM | POA: Diagnosis present

## 2022-08-05 DIAGNOSIS — I5082 Biventricular heart failure: Secondary | ICD-10-CM | POA: Diagnosis present

## 2022-08-05 DIAGNOSIS — D649 Anemia, unspecified: Secondary | ICD-10-CM | POA: Diagnosis present

## 2022-08-05 DIAGNOSIS — I700 Atherosclerosis of aorta: Secondary | ICD-10-CM | POA: Diagnosis present

## 2022-08-05 DIAGNOSIS — J9811 Atelectasis: Secondary | ICD-10-CM | POA: Diagnosis present

## 2022-08-05 DIAGNOSIS — D500 Iron deficiency anemia secondary to blood loss (chronic): Secondary | ICD-10-CM | POA: Diagnosis present

## 2022-08-05 DIAGNOSIS — J9601 Acute respiratory failure with hypoxia: Secondary | ICD-10-CM | POA: Diagnosis present

## 2022-08-05 LAB — CBC WITH DIFFERENTIAL/PLATELET
Abs Immature Granulocytes: 0.04 10*3/uL (ref 0.00–0.07)
Basophils Absolute: 0 10*3/uL (ref 0.0–0.1)
Basophils Relative: 0 %
Eosinophils Absolute: 0 10*3/uL (ref 0.0–0.5)
Eosinophils Relative: 1 %
HCT: 22.1 % — ABNORMAL LOW (ref 36.0–46.0)
Hemoglobin: 7.4 g/dL — ABNORMAL LOW (ref 12.0–15.0)
Immature Granulocytes: 1 %
Lymphocytes Relative: 18 %
Lymphs Abs: 1.1 10*3/uL (ref 0.7–4.0)
MCH: 32.7 pg (ref 26.0–34.0)
MCHC: 33.5 g/dL (ref 30.0–36.0)
MCV: 97.8 fL (ref 80.0–100.0)
Monocytes Absolute: 0.4 10*3/uL (ref 0.1–1.0)
Monocytes Relative: 7 %
Neutro Abs: 4.7 10*3/uL (ref 1.7–7.7)
Neutrophils Relative %: 73 %
Platelets: 126 10*3/uL — ABNORMAL LOW (ref 150–400)
RBC: 2.26 MIL/uL — ABNORMAL LOW (ref 3.87–5.11)
RDW: 15.5 % (ref 11.5–15.5)
WBC: 6.3 10*3/uL (ref 4.0–10.5)
nRBC: 0 % (ref 0.0–0.2)

## 2022-08-05 LAB — HEMOGLOBIN AND HEMATOCRIT, BLOOD
HCT: 24.7 % — ABNORMAL LOW (ref 36.0–46.0)
Hemoglobin: 8.6 g/dL — ABNORMAL LOW (ref 12.0–15.0)

## 2022-08-05 LAB — ABO/RH: ABO/RH(D): A POS

## 2022-08-05 LAB — PREPARE RBC (CROSSMATCH)

## 2022-08-05 LAB — BASIC METABOLIC PANEL
Anion gap: 7 (ref 5–15)
BUN: 23 mg/dL (ref 8–23)
CO2: 24 mmol/L (ref 22–32)
Calcium: 9.2 mg/dL (ref 8.9–10.3)
Chloride: 106 mmol/L (ref 98–111)
Creatinine, Ser: 1.47 mg/dL — ABNORMAL HIGH (ref 0.44–1.00)
GFR, Estimated: 33 mL/min — ABNORMAL LOW (ref >60)
Glucose, Bld: 93 mg/dL (ref 70–99)
Potassium: 3.8 mmol/L (ref 3.5–5.1)
Sodium: 137 mmol/L (ref 135–145)

## 2022-08-05 LAB — MAGNESIUM: Magnesium: 2.3 mg/dL (ref 1.7–2.4)

## 2022-08-05 MED ORDER — SODIUM CHLORIDE 0.9% IV SOLUTION: Freq: Once | INTRAVENOUS | Status: AC

## 2022-08-05 NOTE — Progress Notes (Signed)
Progress Note  Patient Name: Deanna White Date of Encounter: 08/05/2022  Primary Cardiologist: Rockey Situ  Subjective   No chest pain. Breathing stable. Hemoglobin trended down to 7.4 this morning. BUN/SCr uptrending from 19/1.33-23/1.47. Documented urine output 1.1 L for the admission.  Inpatient Medications    Scheduled Meds:  cloNIDine  0.1 mg Oral BID   dorzolamide  1 drop Both Eyes TID   fluticasone  2 spray Each Nare Daily   furosemide  20 mg Intravenous Q12H   levothyroxine  75 mcg Oral Q0600   losartan  100 mg Oral Daily   metoprolol tartrate  12.5 mg Oral BID   Continuous Infusions:  PRN Meds: acetaminophen **OR** acetaminophen, ondansetron **OR** ondansetron (ZOFRAN) IV   Vital Signs    Vitals:   08/05/22 0004 08/05/22 0031 08/05/22 0432 08/05/22 0715  BP: (!) 99/32 (!) 108/36 (!) 151/62   Pulse: 72 72 86   Resp: '18 18 18   '$ Temp: 98.1 F (36.7 C)  98.1 F (36.7 C)   TempSrc:      SpO2: 94% 97% 90%   Weight:    50.2 kg  Height:        Intake/Output Summary (Last 24 hours) at 08/05/2022 0720 Last data filed at 08/05/2022 0709 Gross per 24 hour  Intake 240 ml  Output 1400 ml  Net -1160 ml   Filed Weights   08/03/22 1429 08/05/22 0715  Weight: 50.6 kg 50.2 kg    Telemetry    SR with 7 beat run of SVT - Personally Reviewed  ECG    No new tracings - Personally Reviewed  Physical Exam   GEN: No acute distress. Elderly.  Neck: No JVD. Cardiac: RRR, II/VI systolic murmur LSB, no rubs, or gallops.  Respiratory: Mildly diminished at the bases bilaterally.  GI: Soft, nontender, non-distended.   MS: No edema; No deformity. Neuro:  Alert and oriented x 3; Nonfocal.  Psych: Normal affect.  Labs    Chemistry Recent Labs  Lab 08/02/22 1403 08/03/22 1439 08/04/22 0535 08/05/22 0440  NA  --  134* 135 137  K  --  4.0 3.9 3.8  CL  --  104 104 106  CO2  --  23 21* 24  GLUCOSE  --  133* 104* 93  BUN  --  '22 19 23  '$ CREATININE  --  1.42* 1.33*  1.47*  CALCIUM  --  8.9 8.6* 9.2  PROT 5.5* 6.4*  --   --   ALBUMIN  --  3.4*  --   --   AST  --  29  --   --   ALT  --  12  --   --   ALKPHOS  --  93  --   --   BILITOT  --  0.5  --   --   GFRNONAA  --  34* 37* 33*  ANIONGAP  --  '7 10 7     '$ Hematology Recent Labs  Lab 08/03/22 1439 08/04/22 0535 08/05/22 0440  WBC 4.9  4.8 6.2 6.3  RBC 2.40*  2.39*  2.37* 2.32* 2.26*  HGB 7.9*  7.9* 7.7* 7.4*  HCT 23.8*  23.8* 22.6* 22.1*  MCV 99.2  99.6 97.4 97.8  MCH 32.9  33.1 33.2 32.7  MCHC 33.2  33.2 34.1 33.5  RDW 15.4  15.5 15.5 15.5  PLT 128*  130* 125* 126*    Cardiac EnzymesNo results for input(s): "TROPONINI" in the last 168 hours. No results  for input(s): "TROPIPOC" in the last 168 hours.   BNP Recent Labs  Lab 08/03/22 1439  BNP 708.7*     DDimer No results for input(s): "DDIMER" in the last 168 hours.   Radiology    DG Chest Portable 1 View  Result Date: 08/03/2022 IMPRESSION: 1. Cardiomegaly with increasing interstitial edema. 2. Right pleural effusion has resolved. 3. Small left pleural effusion is unchanged. Electronically Signed   By: Ronney Asters M.D.   On: 08/03/2022 17:42   CT ABDOMEN PELVIS W CONTRAST  Result Date: 08/03/2022 IMPRESSION: Bibasilar effusions and atelectasis greater on LEFT. Small hiatal hernia. Sigmoid diverticulosis without evidence of diverticulitis. Mild LEFT renal collecting system dilatation without obstructing calculus or ureteral dilatation. No acute intra-abdominal or intrapelvic abnormalities. Aortic Atherosclerosis (ICD10-I70.0). Electronically Signed   By: Lavonia Dana M.D.   On: 08/03/2022 16:37    Cardiac Studies   2D echo 07/21/2022: 1. Left ventricular ejection fraction, by estimation, is 60 to 65%. The  left ventricle has normal function. The left ventricle has no regional  wall motion abnormalities. There is moderate left ventricular hypertrophy.  Left ventricular diastolic  parameters are consistent with Grade  II diastolic dysfunction  (pseudonormalization).   2. Right ventricular systolic function is normal. The right ventricular  size is normal. There is severely elevated pulmonary artery systolic  pressure. The estimated right ventricular systolic pressure is 95.6 mmHg.   3. The mitral valve is normal in structure. Mild to moderate mitral valve  regurgitation. Moderate mitral stenosis by estimated mean mitral valve  gradient is 9.5 mmHg. There does not appear to be significant stenosis by  estimated MVA (3.67 cm sq) and  visually appears to open well. Moderate mitral annular calcification.   4. Tricuspid valve regurgitation is moderate.   5. The aortic valve has an indeterminant number of cusps. There is  moderate calcification of the aortic valve. Aortic valve regurgitation is  not visualized. Mild to moderate aortic valve stenosis. Aortic valve area,  by VTI measures 1.26 cm. Aortic  valve mean gradient measures 12.8 mmHg. Aortic valve Vmax measures 2.39  m/s.   6. The inferior vena cava is normal in size with greater than 50%  respiratory variability, suggesting right atrial pressure of 3 mmHg.   7. Rhythm is normal sinus.  __________   2D echo 12/03/2017 Jefm Bryant): INTERPRETATION  NORMAL LEFT VENTRICULAR SYSTOLIC FUNCTION   WITH MODERATE LVH  NORMAL RIGHT VENTRICULAR SYSTOLIC FUNCTION  MILD VALVULAR REGURGITATION (See above)  NO VALVULAR STENOSIS  MILD TO MODERATE MR  MILD TR, PR  MILD PHTN  SCLEROTIC AoV  EF >55%  __________   2D echo 04/13/2016 Jefm Bryant): INTERPRETATION  NORMAL LEFT VENTRICULAR SYSTOLIC FUNCTION WITH AN ESTIMATED EF = 65 %  NORMAL RIGHT VENTRICULAR SYSTOLIC FUNCTION  MODERATE MITRAL VALVE INSUFFICIENCY  MILD-TO-MODERATE TRICUSPID VALVE INSUFFICIENCY  NO VALVULAR STENOSIS  MILD LA ENLARGEMENT  MILD LVH  SCLEROTIC AORTIC VALVE   Patient Profile     88 y.o. female with history of SVT/atrial tachycardia, PAD status post angioplasty of the SFA and  popliteal arteries in 03/2019 followed by vascular surgery with most recent ABI from 10/2021 unchanged and normal, carotid artery stenosis, aortic atherosclerosis, mitral regurgitation, difficult to control HTN with normal renal artery ultrasound in 11/2021, HLD, hypothyroidism, and anemia who is being seen today for the evaluation of HFpEF in the setting of symptomatic anemia at the request of Dr. Damita Dunnings .  Assessment & Plan    1. HFpEF: -Mild exacerbation  in the setting of symptomatic anemia -Hold furosemide with uptrending BUN/SCr -If blood products are given, would administer slowly with diuresis as indicated -No indication for echo (07/21/2022)   2. Elevated high sensitivity troponin: -No chest pain -Mildly elevated and felt to represent supply demand ischemia in the setting of symptomatic anemia with mild renal dysfunction -In the setting of no chest pain and symptomatic anemia, no plans for inpatient ischemic evaluation -No ASA or heparin gtt at this time with anemia   3. Mild to moderate aortic stenosis/moderate mitral stenosis: -Metoprolol -Outpatient follow up   4. Symptomatic anemia with guaiac positive stool: -Per IM/GI/hematology -[facility] Activity Status Index: difficult to assess though appears to be at least 4 METs (remains active) -She would be moderate risk for noncardiac procedure, though no further cardiac testing would reduce this risk -If endoscopy is undertaken, she may proceed at an overall moderate cardiac risk   -Hematology planning for bone narrow biopsy   5.  PAD: -PTA clopidogrel held for now       For questions or updates, please contact [place] Please consult [email or web address] for contact info under Cardiology/STEMI.    Signed, Christell Faith, PA-C [place] Pager: [phone] 08/05/2022, 7:20 AM

## 2022-08-05 NOTE — Progress Notes (Signed)
Deanna Darby, MD [address]  [place]  [place], [place] [address]  Main: [phone]  Fax: [phone] Pager: [phone]   Subjective: No acute events overnight, patient reports feeling better today.  Patient's son is bedside.  Patient reports having 1 regular bowel movement today, denies noticing any black stool or blood in the stool   Objective: Vital signs in last 24 hours: Vitals:   08/05/22 0432 08/05/22 0715 08/05/22 0818 08/05/22 1149  BP: (!) 151/62  (!) 144/51 (!) 108/38  Pulse: 86  82 71  Resp: _0 Temp: 98.1 F (36.7 C)  98.1 F (36.7 C) 97.9 F (36.6 C)  TempSrc:      SpO2: 90%  96% 94%  Weight:  50.2 kg    Height:       Weight change:   Intake/Output Summary (Last 24 hours) at 08/05/2022 1309 Last data filed at 08/05/2022 1033 Gross per 24 hour  Intake --  Output 1600 ml  Net -1600 ml     Exam: Heart:: Regular rate and rhythm, S1S2 present, or without murmur or extra heart sounds Lungs: normal and clear to auscultation Abdomen: soft, nontender, normal bowel sounds   Lab Results:    Latest Ref Rng & Units 08/05/2022    4:40 AM 08/04/2022    5:35 AM 08/03/2022    2:39 PM  CBC  WBC 4.0 - 10.5 K/uL 6.3  6.2  4.8    4.9   Hemoglobin 12.0 - 15.0 g/dL 7.4  7.7  7.9    7.9   Hematocrit 36.0 - 46.0 % 22.1  22.6  23.8    23.8   Platelets 150 - 400 K/uL 126  125  130    128       Latest Ref Rng & Units 08/05/2022    4:40 AM 08/04/2022    5:35 AM 08/03/2022    2:39 PM  CMP  Glucose 70 - 99 mg/dL 93  104  133   BUN 8 - 23 mg/dL _1 Creatinine 0.44 - 1.00 mg/dL 1.47  1.33  1.42   Sodium 135 - 145 mmol/L 137  135  134   Potassium 3.5 - 5.1 mmol/L 3.8  3.9  4.0   Chloride 98 - 111 mmol/L 106  104  104   CO2 22 - 32 mmol/L _2 Calcium 8.9 - 10.3 mg/dL 9.2  8.6  8.9   Total Protein 6.5 - 8.1 g/dL   6.4   Total Bilirubin 0.3 - 1.2 mg/dL   0.5   Alkaline Phos 38 - 126 U/L   93   AST 15 - 41 U/L   29   ALT 0 -  44 U/L   12     Micro Results: Recent Results (from the past 240 hour(s))  SARS Coronavirus 2 by RT PCR (hospital order, performed in [facility] hospital lab) *cepheid single result test* Anterior Nasal Swab     Status: None   Collection Time: 08/03/22  5:07 PM   Specimen: Anterior Nasal Swab  Result Value Ref Range Status   SARS Coronavirus 2 by RT PCR NEGATIVE NEGATIVE Final    Comment: (NOTE) SARS-CoV-2 target nucleic acids are NOT DETECTED.  The SARS-CoV-2 RNA is generally detectable in upper and lower respiratory specimens during the acute phase of infection. The lowest concentration of SARS-CoV-2 viral copies this assay can detect is 250 copies /  mL. A negative result does not preclude SARS-CoV-2 infection and should not be used as the sole basis for treatment or other patient management decisions.  A negative result may occur with improper specimen collection / handling, submission of specimen other than nasopharyngeal swab, presence of viral mutation(s) within the areas targeted by this assay, and inadequate number of viral copies (<250 copies / mL). A negative result must be combined with clinical observations, patient history, and epidemiological information.  Fact Sheet for Patients:   https://www.patel.info/  Fact Sheet for Healthcare Providers: https://hall.com/  This test is not yet approved or  cleared by the [place] FDA and has been authorized for detection and/or diagnosis of SARS-CoV-2 by FDA under an Emergency Use Authorization (EUA).  This EUA will remain in effect (meaning this test can be used) for the duration of the COVID-19 declaration under Section 564(b)(1) of the Act, 21 U.S.C. section 360bbb-3(b)(1), unless the authorization is terminated or revoked sooner.  Performed at [facility], [address] [place], [place] [address]    Studies/Results: DG Chest Portable 1 View  Result Date:  08/03/2022 CLINICAL DATA:  Weakness EXAM: PORTABLE CHEST 1 VIEW COMPARISON:  Chest x-ray 05/05/2020 FINDINGS: Heart is enlarged. Right pleural effusion has resolved. Small left pleural effusion is unchanged. There is some minimal patchy opacities in the lung bases. Central interstitial prominence has increased. There is no pneumothorax. No acute fractures are seen. IMPRESSION: 1. Cardiomegaly with increasing interstitial edema. 2. Right pleural effusion has resolved. 3. Small left pleural effusion is unchanged. Electronically Signed   By: Ronney Asters M.D.   On: 08/03/2022 17:42   CT ABDOMEN PELVIS W CONTRAST  Result Date: 08/03/2022 CLINICAL DATA:  Anemia, RIGHT lower quadrant abdominal pain, history hypertension TAHBSO, appendectomy EXAM: CT ABDOMEN AND PELVIS WITH CONTRAST TECHNIQUE: Multidetector CT imaging of the abdomen and pelvis was performed using the standard protocol following bolus administration of intravenous contrast. RADIATION DOSE REDUCTION: This exam was performed according to the departmental dose-optimization program which includes automated exposure control, adjustment of the mA and/or kV according to patient size and/or use of iterative reconstruction technique. CONTRAST:  42m OMNIPAQUE IOHEXOL 300 MG/ML SOLN IV. No oral contrast. COMPARISON:  None Available. FINDINGS: Lower chest: Bibasilar effusions and atelectasis greater on LEFT. Mild enlargement of cardiac chambers. Small hiatal hernia. Hepatobiliary: Gallbladder and liver normal appearance Pancreas: Normal appearance Spleen: Irregular spleen with calcifications question sequela of trauma or infarct. No mass. Adrenals/Urinary Tract: Adrenal glands unremarkable. Mild LEFT renal collecting system dilatation without obstructing calculus or ureteral dilatation. RIGHT kidney and RIGHT ureter unremarkable. Bladder normal appearance. Stomach/Bowel: Appendix surgically absent by history. Scattered stool throughout colon. Elongated J-shaped  stomach with small hiatal hernia. Sigmoid diverticulosis. Remaining bowel loops unremarkable. Vascular/Lymphatic: Atherosclerotic calcifications aorta, iliac arteries, visceral arteries. Aorta normal caliber. Scattered pelvic phleboliths. No adenopathy. Reproductive: Uterus and ovaries surgically absent Other: No free air or free fluid. No hernia or inflammatory process. Musculoskeletal: Osseous demineralization. Superior endplate compression deformity L1, unchanged since 05/05/2020. Trace LEFT hip joint effusion. No acute osseous findings. IMPRESSION: Bibasilar effusions and atelectasis greater on LEFT. Small hiatal hernia. Sigmoid diverticulosis without evidence of diverticulitis. Mild LEFT renal collecting system dilatation without obstructing calculus or ureteral dilatation. No acute intra-abdominal or intrapelvic abnormalities. Aortic Atherosclerosis (ICD10-I70.0). Electronically Signed   By: MLavonia DanaM.D.   On: 08/03/2022 16:37   Medications: I have reviewed the patient's current medications. Prior to Admission:  Medications Prior to Admission  Medication Sig Dispense Refill  Last Dose   amLODipine (NORVASC) 5 MG tablet Take 0.5 tab by mouth every AM, and take 1 tab  by mouth every PM 60 tablet 3 08/03/2022 at 0800   cloNIDine (CATAPRES) 0.1 MG tablet 1 tablet in the am and 2 in the evening (Patient taking differently: 1 tablet in the am and 1 In the evening) 270 tablet 2 08/03/2022 at 0800   clopidogrel (PLAVIX) 75 MG tablet TAKE 1 TABLET BY MOUTH EVERY DAY 90 tablet 1 08/03/2022 at 0800   dorzolamide (TRUSOPT) 2 % ophthalmic solution Place 1 drop into both eyes 3 (three) times daily.   12 08/03/2022   levothyroxine (SYNTHROID) 75 MCG tablet TAKE 1 TABLET BY MOUTH EVERY DAY 90 tablet 1 08/03/2022   losartan (COZAAR) 100 MG tablet TAKE 1 TABLET BY MOUTH EVERY DAY 90 tablet 1 08/03/2022   Multiple Vitamins-Minerals (PRESERVISION AREDS 2) CAPS Take 1 capsule by mouth 2 (two) times daily.    08/03/2022    cyanocobalamin (,VITAMIN B-12,) 1000 MCG/ML injection Inject 1 mL (1,000 mcg total) into the muscle every 30 (thirty) days. 10 mL 0 07/09/2022   fluticasone (FLONASE) 50 MCG/ACT nasal spray Place 2 sprays into both nostrils daily. 16 g 6 prn   furosemide (LASIX) 20 MG tablet Take 1 tablet (20 mg total) by mouth as needed (As needed for shortness of breath or weight gain.). As needed for swellling 45 tablet 3 prn   ketoconazole (NIZORAL) 2 % cream Apply 1 Application topically at bedtime. 60 g 11 prn   NEEDLE, DISP, 25 G 25G X 1" MISC Use to give b12 injection once monthly. 50 each 0    Scheduled:  sodium chloride   Intravenous Once   cloNIDine  0.1 mg Oral BID   dorzolamide  1 drop Both Eyes TID   fluticasone  2 spray Each Nare Daily   levothyroxine  75 mcg Oral Q0600   losartan  100 mg Oral Daily   metoprolol tartrate  12.5 mg Oral BID   Continuous: UKG:[id number] **OR** acetaminophen, ondansetron **OR** ondansetron (ZOFRAN) IV Anti-infectives (From admission, onward)    None      Scheduled Meds:  sodium chloride   Intravenous Once   cloNIDine  0.1 mg Oral BID   dorzolamide  1 drop Both Eyes TID   fluticasone  2 spray Each Nare Daily   levothyroxine  75 mcg Oral Q0600   losartan  100 mg Oral Daily   metoprolol tartrate  12.5 mg Oral BID   Continuous Infusions: PRN Meds:.acetaminophen **OR** acetaminophen, ondansetron **OR** ondansetron (ZOFRAN) IV   Assessment: Principal Problem:   Symptomatic anemia Active Problems:   PAD (peripheral artery disease) (HCC)   Benign essential hypertension   Hypothyroidism   Aortic stenosis   Iron deficiency anemia due to chronic blood loss   Acute on chronic diastolic CHF (congestive heart failure) (HCC)   Anemia of chronic disease   Deanna White is a 88 y.o. female with history of peripheral artery disease s/p angioplasty on Plavix, SVT/atrial tachycardia, chronic thrombocytopenia of unclear etiology is admitted with severe  symptomatic anemia over the span of 6 months and CHF, ?  Intermittent dark stools, stool guaiac positive in the ER.  Patient had 1 normal bowel movement today  Plan: Anemia of chronic disease With no evidence of active GI bleed, defer inpatient GI work-up Patient will be undergoing bone marrow biopsy as outpatient and follow-up with heme-onc, Dr. Janese Banks I have given my clinic number to patient's son  and based on the bone marrow biopsy results, I will be happy to see patient in my office and discuss about any endoscopic evaluation to rule out occult malignancy if deemed necessary Both patient and son are in agreement with the plan Diet as tolerated After discussing with Dr. Priscella Mann, Plavix will be held temporarily until completion of anemia work-up GI will sign off at this time, please call us back with questions or concerns   LOS: 1 day   Campbell Kray 08/05/2022, 1:09 PM

## 2022-08-05 NOTE — Progress Notes (Signed)
Oxygen Needs  Tried weaning patient off of O2.  Started the day at 3L and dropped to 2L.  She maintained sat's in the mid 90's throughout the day.  After the blood had finished, I assisted her to the bathroom on RA.  I rechecked her sat's while she was sitting in the bathroom and she was hovering around 83%.  I put her back on 2L and it took awhile to get back to 87-88%.  Once back in bed I increased her to 3L and she came back to around 93-95%.  I left her on 3L and advised the oncoming nurse to try weaning her again.  She probably needs an ambulation study prior to discharge to see if she will require O2 at home.

## 2022-08-05 NOTE — Progress Notes (Signed)
PROGRESS NOTE    Deanna White  KWI:[id number] DOB: Sep 05, 1925 DOA: 08/03/2022 PCP: Crecencio Mc, MD    Brief Narrative:  88 year old female who was sent to the emergency room by her primary care physician due to worsening anemia of unknown etiology.  Patient had a drop in hemoglobin from 12 to mid sevens.  On presentation there was also concern for mild exacerbation of patient's preserved ejection fraction heart failure.  She is placed on gentle IV diuresis per cardiology.  No plans for ischemic evaluation or echocardiogram.   Patient has hypoxia as well, currently on 3-4L nasal cannula.  Likely due to fluid overload/decompensated HF   Patient also has symptomatic anemia.  Guaiac positive stool in the ED.  Folate, B12 within normal limits.  Iron indices seem to indicate anemia of inflammation rather than true iron deficiency but patient may benefit from IV iron.  Will await GI recommendations and evaluation.  Patient may warrant endoscopy during this admission.   Patient is on Plavix.  I believe this is part of her treatment for peripheral arterial disease.  1/4: Case discussed with hematology and GI.  GI endoscopic evaluation on hold for now.  Iron indices, B12, folate seem to indicate a diagnosis of anemia of chronic disease.  However diagnosis is somewhat unclear.  Iron indices do not indicate acute nor chronic blood loss.  Tentative plan for bone marrow biopsy Monday 11/6.  Kidney function slightly worse.  Patient remains on IV diuretics per cardiology.  She remains on supplemental oxygen though improved from prior.  Currently on 2 L nasal cannula.  Hemoglobin decreased to 7.4 from 7.9 on admission.  Considering chronic comorbidities we will transfuse to goal hemoglobin of 8.   Assessment & Plan:   Principal Problem:   Symptomatic anemia Active Problems:   Acute on chronic diastolic CHF (congestive heart failure) (HCC)   Iron deficiency anemia due to chronic blood loss   PAD  (peripheral artery disease) (HCC)   Benign essential hypertension   Hypothyroidism   Aortic stenosis   Anemia of chronic disease  Acute symptomatic anemia Guaiac positive stools Unclear etiology.  Iron indices seem to indicate anemia of chronic disease.  Patient not overtly iron deficient.  B12 and folate also within normal limits.  No evidence of active bleed other than questionable intermittent dark stools.  However she is on Plavix and has been so on several years. Plan: At this time no plans for inpatient endoscopic evaluation.  GI and hematology on board.  Tentative plan for bone marrow biopsy on 11/6.  Continue holding Plavix.  Transfuse 1 unit PRBC for hemoglobin 7.4.  Posttransfusion H&H.  Acute decompensated diastolic congestive heart failure Acute hypoxic respiratory failure secondary to above Patient did have evidence of fluid overload though no lower extremity edema noted.  Elevated BNP and interstitial edema seen on x-ray.  Likely that hypoxic respiratory failure was driven by fluid overload/decompensated heart failure.  The patient has no baseline requirement.  She had a pulse oximetry of 86% on room air on arrival.  Has required up to 4 L nasal cannula since admission.  Currently on 2 L.  Likely that symptomatic anemia drove mild exacerbation of heart failure Plan: Nasal cannula, wean as tolerated Diuresis as above Notification for repeat echo Therapy evaluations, ambulate as tolerated  Elevated high-sensitivity troponin Unlikely to represent ACS.  No chest pain.  No significant delta.  Suspect supply demand ischemia in the setting of symptomatic anemia.  Aspirin and heparin  gtt. contraindicated in the setting of worsening anemia.  Mild to moderate valvulopathy Patient with history of mild aortic stenosis mild mitral stenosis.  Will continue home metoprolol.  Outpatient cardiology follow-up.  Peripheral arterial disease Continue holding Plavix  Hypothyroidism PTA  Synthroid  Essential hypertension Resume home clonidine Lasix as above PTA metoprolol Hold losartan  AKI Creatinine normal at baseline.  Elevated to 1.47 on 11/4. Suspect prerenal azotemia versus ATN Blood transfusion as above Daily BMP    DVT prophylaxis: SCD Code Status: Full Family Communication: Daughter at bedside 11/4 Disposition Plan: Status is: Observation The patient will require care spanning > 2 midnights and should be moved to inpatient because: AKI, acute compensated heart failure, acute anemia requiring transfusion   Level of care: Progressive  Consultants:  GI Cardiology Hematology  Procedures:  None  Antimicrobials: None   Subjective: Seen and examined.  Resting comfortably in bed.  No visible distress.  Reports breathing a little better.  Reports improved sleep.  Objective: Vitals:   08/05/22 0031 08/05/22 0432 08/05/22 0715 08/05/22 0818  BP: (!) 108/36 (!) 151/62  (!) 144/51  Pulse: 72 86  82  Resp: _0 Temp:  98.1 F (36.7 C)  98.1 F (36.7 C)  TempSrc:      SpO2: 97% 90%  96%  Weight:   50.2 kg   Height:        Intake/Output Summary (Last 24 hours) at 08/05/2022 1132 Last data filed at 08/05/2022 1033 Gross per 24 hour  Intake 240 ml  Output 1600 ml  Net -1360 ml   Filed Weights   08/03/22 1429 08/05/22 0715  Weight: 50.6 kg 50.2 kg    Examination:  General exam: NAD Respiratory system: Scattered crackles bilaterally.  Normal work of breathing.  2 L Cardiovascular system: S1-S2 RRR, 2/6 murmur, no pitting edema Gastrointestinal system: Abdomen is nondistended, soft and nontender. No organomegaly or masses felt. Normal bowel sounds heard. Central nervous system: Alert and oriented. No focal neurological deficits. Extremities: Symmetric 5 x 5 power. Skin: No rashes, lesions or ulcers Psychiatry: Judgement and insight appear normal. Mood & affect appropriate.     Data Reviewed: I have personally reviewed following  labs and imaging studies  CBC: Recent Labs  Lab 08/02/22 1403 08/03/22 1439 08/04/22 0535 08/05/22 0440  WBC 4.4 4.9  4.8 6.2 6.3  NEUTROABS 3.2 3.4  --  4.7  HGB 7.5 Repeated and verified X2.* 7.9*  7.9* 7.7* 7.4*  HCT 22.2 Repeated and verified X2.* 23.8*  23.8* 22.6* 22.1*  MCV 98.8 99.2  99.6 97.4 97.8  PLT 118.0* 128*  130* 125* 962*   Basic Metabolic Panel: Recent Labs  Lab 08/03/22 1439 08/04/22 0535 08/05/22 0440  NA 134* 135 137  K 4.0 3.9 3.8  CL 104 104 106  CO2 23 21* 24  GLUCOSE 133* 104* 93  BUN _1 CREATININE 1.42* 1.33* 1.47*  CALCIUM 8.9 8.6* 9.2  MG  --   --  2.3   GFR: Estimated Creatinine Clearance: 18.5 mL/min (A) (by C-G formula based on SCr of 1.47 mg/dL (H)). Liver Function Tests: Recent Labs  Lab 08/02/22 1403 08/03/22 1439  AST  --  29  ALT  --  12  ALKPHOS  --  93  BILITOT  --  0.5  PROT 5.5* 6.4*  ALBUMIN  --  3.4*   Recent Labs  Lab 08/03/22 1439  LIPASE 40   No results for input(s): "AMMONIA"  in the last 168 hours. Coagulation Profile: No results for input(s): "INR", "PROTIME" in the last 168 hours. Cardiac Enzymes: No results for input(s): "CKTOTAL", "CKMB", "CKMBINDEX", "TROPONINI" in the last 168 hours. BNP (last 3 results) No results for input(s): "PROBNP" in the last 8760 hours. HbA1C: No results for input(s): "HGBA1C" in the last 72 hours. CBG: No results for input(s): "GLUCAP" in the last 168 hours. Lipid Profile: No results for input(s): "CHOL", "HDL", "LDLCALC", "TRIG", "CHOLHDL", "LDLDIRECT" in the last 72 hours. Thyroid Function Tests: No results for input(s): "TSH", "T4TOTAL", "FREET4", "T3FREE", "THYROIDAB" in the last 72 hours. Anemia Panel: Recent Labs    08/02/22 1402 08/02/22 1403 08/03/22 1439 08/03/22 1707  VITAMINB12  --   --   --  1,218*  FOLATE  --   --  35.0  --   FERRITIN  --  763.5* 846*  --   TIBC  --  238.0* 241*  --   IRON  --  54 55  --   RETICCTPCT 1.6  --  2.0  --     Sepsis Labs: No results for input(s): "PROCALCITON", "LATICACIDVEN" in the last 168 hours.  Recent Results (from the past 240 hour(s))  SARS Coronavirus 2 by RT PCR (hospital order, performed in [name] hospital lab) *cepheid single result test* Anterior Nasal Swab     Status: None   Collection Time: 08/03/22  5:07 PM   Specimen: Anterior Nasal Swab  Result Value Ref Range Status   SARS Coronavirus 2 by RT PCR NEGATIVE NEGATIVE Final    Comment: (NOTE) SARS-CoV-2 target nucleic acids are NOT DETECTED.  The SARS-CoV-2 RNA is generally detectable in upper and lower respiratory specimens during the acute phase of infection. The lowest concentration of SARS-CoV-2 viral copies this assay can detect is 250 copies / mL. A negative result does not preclude SARS-CoV-2 infection and should not be used as the sole basis for treatment or other patient management decisions.  A negative result may occur with improper specimen collection / handling, submission of specimen other than nasopharyngeal swab, presence of viral mutation(s) within the areas targeted by this assay, and inadequate number of viral copies (<250 copies / mL). A negative result must be combined with clinical observations, patient history, and epidemiological information.  Fact Sheet for Patients:   https://www.patel.info/  Fact Sheet for Healthcare Providers: https://hall.com/  This test is not yet approved or  cleared by the [place] FDA and has been authorized for detection and/or diagnosis of SARS-CoV-2 by FDA under an Emergency Use Authorization (EUA).  This EUA will remain in effect (meaning this test can be used) for the duration of the COVID-19 declaration under Section 564(b)(1) of the Act, 21 U.S.C. section 360bbb-3(b)(1), unless the authorization is terminated or revoked sooner.  Performed at [facility], [address], [place], [place]  [address]          Radiology Studies: DG Chest Portable 1 View  Result Date: 08/03/2022 CLINICAL DATA:  Weakness EXAM: PORTABLE CHEST 1 VIEW COMPARISON:  Chest x-ray 05/05/2020 FINDINGS: Heart is enlarged. Right pleural effusion has resolved. Small left pleural effusion is unchanged. There is some minimal patchy opacities in the lung bases. Central interstitial prominence has increased. There is no pneumothorax. No acute fractures are seen. IMPRESSION: 1. Cardiomegaly with increasing interstitial edema. 2. Right pleural effusion has resolved. 3. Small left pleural effusion is unchanged. Electronically Signed   By: Ronney Asters M.D.   On: 08/03/2022 17:42   CT  ABDOMEN PELVIS W CONTRAST  Result Date: 08/03/2022 CLINICAL DATA:  Anemia, RIGHT lower quadrant abdominal pain, history hypertension TAHBSO, appendectomy EXAM: CT ABDOMEN AND PELVIS WITH CONTRAST TECHNIQUE: Multidetector CT imaging of the abdomen and pelvis was performed using the standard protocol following bolus administration of intravenous contrast. RADIATION DOSE REDUCTION: This exam was performed according to the departmental dose-optimization program which includes automated exposure control, adjustment of the mA and/or kV according to patient size and/or use of iterative reconstruction technique. CONTRAST:  42m OMNIPAQUE IOHEXOL 300 MG/ML SOLN IV. No oral contrast. COMPARISON:  None Available. FINDINGS: Lower chest: Bibasilar effusions and atelectasis greater on LEFT. Mild enlargement of cardiac chambers. Small hiatal hernia. Hepatobiliary: Gallbladder and liver normal appearance Pancreas: Normal appearance Spleen: Irregular spleen with calcifications question sequela of trauma or infarct. No mass. Adrenals/Urinary Tract: Adrenal glands unremarkable. Mild LEFT renal collecting system dilatation without obstructing calculus or ureteral dilatation. RIGHT kidney and RIGHT ureter unremarkable. Bladder normal appearance. Stomach/Bowel: Appendix  surgically absent by history. Scattered stool throughout colon. Elongated J-shaped stomach with small hiatal hernia. Sigmoid diverticulosis. Remaining bowel loops unremarkable. Vascular/Lymphatic: Atherosclerotic calcifications aorta, iliac arteries, visceral arteries. Aorta normal caliber. Scattered pelvic phleboliths. No adenopathy. Reproductive: Uterus and ovaries surgically absent Other: No free air or free fluid. No hernia or inflammatory process. Musculoskeletal: Osseous demineralization. Superior endplate compression deformity L1, unchanged since 05/05/2020. Trace LEFT hip joint effusion. No acute osseous findings. IMPRESSION: Bibasilar effusions and atelectasis greater on LEFT. Small hiatal hernia. Sigmoid diverticulosis without evidence of diverticulitis. Mild LEFT renal collecting system dilatation without obstructing calculus or ureteral dilatation. No acute intra-abdominal or intrapelvic abnormalities. Aortic Atherosclerosis (ICD10-I70.0). Electronically Signed   By: MLavonia DanaM.D.   On: 08/03/2022 16:37        Scheduled Meds:  sodium chloride   Intravenous Once   cloNIDine  0.1 mg Oral BID   dorzolamide  1 drop Both Eyes TID   fluticasone  2 spray Each Nare Daily   levothyroxine  75 mcg Oral Q0600   losartan  100 mg Oral Daily   metoprolol tartrate  12.5 mg Oral BID   Continuous Infusions:   LOS: 1 day    SSidney Ace MD [facility]   If 7PM-7AM, please contact night-coverage  08/05/2022, 11:32 AM

## 2022-08-06 DIAGNOSIS — I5033 Acute on chronic diastolic (congestive) heart failure: Secondary | ICD-10-CM | POA: Diagnosis not present

## 2022-08-06 DIAGNOSIS — I350 Nonrheumatic aortic (valve) stenosis: Secondary | ICD-10-CM | POA: Diagnosis not present

## 2022-08-06 DIAGNOSIS — D649 Anemia, unspecified: Secondary | ICD-10-CM | POA: Diagnosis not present

## 2022-08-06 LAB — CBC WITH DIFFERENTIAL/PLATELET
Abs Immature Granulocytes: 0.04 10*3/uL (ref 0.00–0.07)
Basophils Absolute: 0 10*3/uL (ref 0.0–0.1)
Basophils Relative: 0 %
Eosinophils Absolute: 0 10*3/uL (ref 0.0–0.5)
Eosinophils Relative: 1 %
HCT: 25.7 % — ABNORMAL LOW (ref 36.0–46.0)
Hemoglobin: 8.8 g/dL — ABNORMAL LOW (ref 12.0–15.0)
Immature Granulocytes: 1 %
Lymphocytes Relative: 13 %
Lymphs Abs: 0.6 10*3/uL — ABNORMAL LOW (ref 0.7–4.0)
MCH: 32.5 pg (ref 26.0–34.0)
MCHC: 34.2 g/dL (ref 30.0–36.0)
MCV: 94.8 fL (ref 80.0–100.0)
Monocytes Absolute: 0.3 10*3/uL (ref 0.1–1.0)
Monocytes Relative: 7 %
Neutro Abs: 3.5 10*3/uL (ref 1.7–7.7)
Neutrophils Relative %: 78 %
Platelets: 115 10*3/uL — ABNORMAL LOW (ref 150–400)
RBC: 2.71 MIL/uL — ABNORMAL LOW (ref 3.87–5.11)
RDW: 17.1 % — ABNORMAL HIGH (ref 11.5–15.5)
WBC: 4.4 10*3/uL (ref 4.0–10.5)
nRBC: 0 % (ref 0.0–0.2)

## 2022-08-06 LAB — TYPE AND SCREEN
ABO/RH(D): A POS
Antibody Screen: NEGATIVE
Unit division: 0

## 2022-08-06 LAB — BASIC METABOLIC PANEL
Anion gap: 9 (ref 5–15)
BUN: 22 mg/dL (ref 8–23)
CO2: 23 mmol/L (ref 22–32)
Calcium: 9.3 mg/dL (ref 8.9–10.3)
Chloride: 106 mmol/L (ref 98–111)
Creatinine, Ser: 1.48 mg/dL — ABNORMAL HIGH (ref 0.44–1.00)
GFR, Estimated: 33 mL/min — ABNORMAL LOW (ref >60)
Glucose, Bld: 96 mg/dL (ref 70–99)
Potassium: 3.8 mmol/L (ref 3.5–5.1)
Sodium: 138 mmol/L (ref 135–145)

## 2022-08-06 LAB — BPAM RBC
Blood Product Expiration Date: 202311292359
ISSUE DATE / TIME: 202311041317
Unit Type and Rh: 6200

## 2022-08-06 LAB — MAGNESIUM: Magnesium: 2 mg/dL (ref 1.7–2.4)

## 2022-08-06 NOTE — Progress Notes (Signed)
PROGRESS NOTE    Deanna White  SWH:[id number] DOB: 07/02/1925 DOA: 08/03/2022 PCP: Crecencio Mc, MD    Brief Narrative:  88 year old female who was sent to the emergency room by her primary care physician due to worsening anemia of unknown etiology.  Patient had a drop in hemoglobin from 12 to mid sevens.  On presentation there was also concern for mild exacerbation of patient's preserved ejection fraction heart failure.  She is placed on gentle IV diuresis per cardiology.  No plans for ischemic evaluation or echocardiogram.   Patient has hypoxia as well, currently on 3-4L nasal cannula.  Likely due to fluid overload/decompensated HF   Patient also has symptomatic anemia.  Guaiac positive stool in the ED.  Folate, B12 within normal limits.  Iron indices seem to indicate anemia of inflammation rather than true iron deficiency but patient may benefit from IV iron.  Will await GI recommendations and evaluation.  Patient may warrant endoscopy during this admission.   Patient is on Plavix.  I believe this is part of her treatment for peripheral arterial disease.  1/4: Case discussed with hematology and GI.  GI endoscopic evaluation on hold for now.  Iron indices, B12, folate seem to indicate a diagnosis of anemia of chronic disease.  However diagnosis is somewhat unclear.  Iron indices do not indicate acute nor chronic blood loss.  Tentative plan for bone marrow biopsy Monday 11/6.  Kidney function slightly worse.  Patient remains on IV diuretics per cardiology.  She remains on supplemental oxygen though improved from prior.  Currently on 2 L nasal cannula.  Hemoglobin decreased to 7.4 from 7.9 on admission.  Considering chronic comorbidities we will transfuse to goal hemoglobin of 8.  11/5: Received 1 unit PRBC with appropriate rise in hemoglobin.   Assessment & Plan:   Principal Problem:   Symptomatic anemia Active Problems:   Acute on chronic diastolic CHF (congestive heart failure)  (HCC)   Iron deficiency anemia due to chronic blood loss   PAD (peripheral artery disease) (HCC)   Benign essential hypertension   Hypothyroidism   Aortic stenosis   Anemia of chronic disease   Acute decompensated heart failure (HCC)  Acute symptomatic anemia Guaiac positive stools Unclear etiology.  Iron indices seem to indicate anemia of chronic disease.  Patient not overtly iron deficient.  B12 and folate also within normal limits.  No evidence of active bleed other than questionable intermittent dark stools.  However she is on Plavix and has been so on several years. Transfused 1 unit PRBC on 11/4 Plan: At this time no plans for inpatient endoscopic evaluation.  GI and hematology on board.  Tentative plan for bone marrow biopsy on 11/6.  Continue holding Plavix.  Transfuse 1 unit PRBC for hemoglobin 7.4.  Posttransfusion H&H.  Acute decompensated diastolic congestive heart failure Acute hypoxic respiratory failure secondary to above Patient did have evidence of fluid overload though no lower extremity edema noted.  Elevated BNP and interstitial edema seen on x-ray.  Likely that hypoxic respiratory failure was driven by fluid overload/decompensated heart failure.  The patient has no baseline requirement.  She had a pulse oximetry of 86% on room air on arrival.  Has required up to 4 L nasal cannula since admission.  Currently on 2 L.  Likely that symptomatic anemia drove mild exacerbation of heart failure.  Cardiology discontinued Lasix on 11/4.  Last dose on 11/3. Plan: Nasal cannula, wean as tolerated Defer decision regarding continuing diuresis to cardiology No  indication for repeat echo Therapy evaluations, ambulate as tolerated  Elevated high-sensitivity troponin Unlikely to represent ACS.  No chest pain.  No significant delta.  Suspect supply demand ischemia in the setting of symptomatic anemia.  Aspirin and heparin gtt. contraindicated in the setting of worsening anemia.  Mild to  moderate valvulopathy Patient with history of mild aortic stenosis mild mitral stenosis.  Will continue home metoprolol.  Outpatient cardiology follow-up.  Peripheral arterial disease Continue holding Plavix  Hypothyroidism PTA Synthroid  Essential hypertension Resume home clonidine Lasix as above PTA metoprolol Hold losartan  AKI Creatinine normal at baseline.  Elevated to 1.47 on 11/4. Suspect prerenal azotemia versus ATN Blood transfusion as above Daily BMP    DVT prophylaxis: SCD Code Status: Full Family Communication: Daughter at bedside 11/4, 11/5 Disposition Plan: Status is: Inpatient Remains inpatient appropriate because: Anemia, hypoxic respiratory failure, plan for inpatient bone marrow biopsy 11/6     Level of care: Progressive  Consultants:  GI Cardiology Hematology  Procedures:  None  Antimicrobials: None   Subjective: Seen and examined.  No visible distress.  Remains on 2 L.  Objective: Vitals:   08/06/22 0421 08/06/22 0500 08/06/22 0805 08/06/22 1115  BP: (!) 146/54  (!) 136/58 (!) 110/50  Pulse: 86  86 74  Resp: _0 Temp: 97.9 F (36.6 C)  97.8 F (36.6 C) 97.8 F (36.6 C)  TempSrc: Oral  Oral Oral  SpO2: 93%  95% 95%  Weight:  46.7 kg    Height:        Intake/Output Summary (Last 24 hours) at 08/06/2022 1336 Last data filed at 08/06/2022 1116 Gross per 24 hour  Intake 530 ml  Output 825 ml  Net -295 ml   Filed Weights   08/03/22 1429 08/05/22 0715 08/06/22 0500  Weight: 50.6 kg 50.2 kg 46.7 kg    Examination:  General exam: No acute distress Respiratory system: Bibasilar crackles.  Normal work of breathing.  2 L Cardiovascular system: S1-S2 RRR, 2/6 murmur, no pitting edema Gastrointestinal system: Soft, NT/ND, normal bowel sounds Central nervous system: Alert and oriented. No focal neurological deficits. Extremities: Symmetric 5 x 5 power. Skin: No rashes, lesions or ulcers Psychiatry: Judgement and insight  appear normal. Mood & affect appropriate.     Data Reviewed: I have personally reviewed following labs and imaging studies  CBC: Recent Labs  Lab 08/02/22 1403 08/03/22 1439 08/04/22 0535 08/05/22 0440 08/05/22 2132 08/06/22 0813  WBC 4.4 4.9  4.8 6.2 6.3  --  4.4  NEUTROABS 3.2 3.4  --  4.7  --  3.5  HGB 7.5 Repeated and verified X2.* 7.9*  7.9* 7.7* 7.4* 8.6* 8.8*  HCT 22.2 Repeated and verified X2.* 23.8*  23.8* 22.6* 22.1* 24.7* 25.7*  MCV 98.8 99.2  99.6 97.4 97.8  --  94.8  PLT 118.0* 128*  130* 125* 126*  --  601*   Basic Metabolic Panel: Recent Labs  Lab 08/03/22 1439 08/04/22 0535 08/05/22 0440 08/06/22 0812  NA 134* 135 137 138  K 4.0 3.9 3.8 3.8  CL 104 104 106 106  CO2 23 21* 24 23  GLUCOSE 133* 104* 93 96  BUN _1 CREATININE 1.42* 1.33* 1.47* 1.48*  CALCIUM 8.9 8.6* 9.2 9.3  MG  --   --  2.3 2.0   GFR: Estimated Creatinine Clearance: 17.1 mL/min (A) (by C-G formula based on SCr of 1.48 mg/dL (H)). Liver Function Tests: Recent Labs  Lab  08/02/22 1403 08/03/22 1439  AST  --  29  ALT  --  12  ALKPHOS  --  93  BILITOT  --  0.5  PROT 5.5* 6.4*  ALBUMIN  --  3.4*   Recent Labs  Lab 08/03/22 1439  LIPASE 40   No results for input(s): "AMMONIA" in the last 168 hours. Coagulation Profile: No results for input(s): "INR", "PROTIME" in the last 168 hours. Cardiac Enzymes: No results for input(s): "CKTOTAL", "CKMB", "CKMBINDEX", "TROPONINI" in the last 168 hours. BNP (last 3 results) No results for input(s): "PROBNP" in the last 8760 hours. HbA1C: No results for input(s): "HGBA1C" in the last 72 hours. CBG: No results for input(s): "GLUCAP" in the last 168 hours. Lipid Profile: No results for input(s): "CHOL", "HDL", "LDLCALC", "TRIG", "CHOLHDL", "LDLDIRECT" in the last 72 hours. Thyroid Function Tests: No results for input(s): "TSH", "T4TOTAL", "FREET4", "T3FREE", "THYROIDAB" in the last 72 hours. Anemia Panel: Recent Labs     08/03/22 1439 08/03/22 1707  VITAMINB12  --  1,218*  FOLATE 35.0  --   FERRITIN 846*  --   TIBC 241*  --   IRON 55  --   RETICCTPCT 2.0  --    Sepsis Labs: No results for input(s): "PROCALCITON", "LATICACIDVEN" in the last 168 hours.  Recent Results (from the past 240 hour(s))  SARS Coronavirus 2 by RT PCR (hospital order, performed in [facility] hospital lab) *cepheid single result test* Anterior Nasal Swab     Status: None   Collection Time: 08/03/22  5:07 PM   Specimen: Anterior Nasal Swab  Result Value Ref Range Status   SARS Coronavirus 2 by RT PCR NEGATIVE NEGATIVE Final    Comment: (NOTE) SARS-CoV-2 target nucleic acids are NOT DETECTED.  The SARS-CoV-2 RNA is generally detectable in upper and lower respiratory specimens during the acute phase of infection. The lowest concentration of SARS-CoV-2 viral copies this assay can detect is 250 copies / mL. A negative result does not preclude SARS-CoV-2 infection and should not be used as the sole basis for treatment or other patient management decisions.  A negative result may occur with improper specimen collection / handling, submission of specimen other than nasopharyngeal swab, presence of viral mutation(s) within the areas targeted by this assay, and inadequate number of viral copies (<250 copies / mL). A negative result must be combined with clinical observations, patient history, and epidemiological information.  Fact Sheet for Patients:   https://www.patel.info/  Fact Sheet for Healthcare Providers: https://hall.com/  This test is not yet approved or  cleared by the [place] FDA and has been authorized for detection and/or diagnosis of SARS-CoV-2 by FDA under an Emergency Use Authorization (EUA).  This EUA will remain in effect (meaning this test can be used) for the duration of the COVID-19 declaration under Section 564(b)(1) of the Act, 21 U.S.C. section  360bbb-3(b)(1), unless the authorization is terminated or revoked sooner.  Performed at [facility], [address] [place], [place] [address]          Radiology Studies: No results found.      Scheduled Meds:  cloNIDine  0.1 mg Oral BID   dorzolamide  1 drop Both Eyes TID   fluticasone  2 spray Each Nare Daily   levothyroxine  75 mcg Oral Q0600   losartan  100 mg Oral Daily   metoprolol tartrate  12.5 mg Oral BID   Continuous Infusions:   LOS: 3 days    Sidney Ace,  MD [facility]   If 7PM-7AM, please contact night-coverage  08/06/2022, 1:36 PM

## 2022-08-06 NOTE — Progress Notes (Signed)
       CROSS COVER NOTE  NAME: Deanna White MRN: [id number] DOB : [birth year] ATTENDING PHYSICIAN: Sidney Ace, MD    Date of Service   08/06/2022   HPI/Events of Note   Notified of BP 104/40 and repeat 116/40. M(r)s Winner has Clonidine and Metoprolol due tonight.  Last night BP was 154/47 prior to PM BP meds and 106/36 ~2hrs after admin.  Interventions   Assessment/Plan:  Hold Clonidine and Metoprolol tonight    This document was prepared using Dragon voice recognition software and may include unintentional dictation errors.  Neomia Glass DNP, MBA, FNP-BC Nurse Practitioner [facility] Pager [phone]

## 2022-08-06 NOTE — Progress Notes (Signed)
Progress Note  Patient Name: Deanna White Date of Encounter: 08/06/2022  Primary Cardiologist: Rockey Situ  Subjective   No acute events overnight. Tolerated transfusion yesterday. Son at bedside.  Inpatient Medications    Scheduled Meds:  cloNIDine  0.1 mg Oral BID   dorzolamide  1 drop Both Eyes TID   fluticasone  2 spray Each Nare Daily   levothyroxine  75 mcg Oral Q0600   losartan  100 mg Oral Daily   metoprolol tartrate  12.5 mg Oral BID   Continuous Infusions:  PRN Meds: acetaminophen **OR** acetaminophen, ondansetron **OR** ondansetron (ZOFRAN) IV   Vital Signs    Vitals:   08/06/22 0421 08/06/22 0500 08/06/22 0805 08/06/22 1115  BP: (!) 146/54  (!) 136/58 (!) 110/50  Pulse: 86  86 74  Resp: '16  17 17  '$ Temp: 97.9 F (36.6 C)  97.8 F (36.6 C) 97.8 F (36.6 C)  TempSrc: Oral  Oral Oral  SpO2: 93%  95% 95%  Weight:  46.7 kg    Height:        Intake/Output Summary (Last 24 hours) at 08/06/2022 1424 Last data filed at 08/06/2022 1116 Gross per 24 hour  Intake 530 ml  Output 775 ml  Net -245 ml   Filed Weights   08/03/22 1429 08/05/22 0715 08/06/22 0500  Weight: 50.6 kg 50.2 kg 46.7 kg    Telemetry    SR with 6 beat run of SVT - Personally Reviewed  ECG    No new tracings - Personally Reviewed  Physical Exam   GEN: Well nourished, well developed in no acute distress NECK: No JVD CARDIAC: regular rhythm, normal S1 and S2, no rubs or gallops. 2/6 systolic ejection murmur. 1/4 diastolic rumble VASCULAR: Radial pulses 2+ bilaterally.  RESPIRATORY:  Clear to auscultation without rales, wheezing or rhonchi  ABDOMEN: Soft, non-tender, non-distended MUSCULOSKELETAL:  Moves all 4 limbs independently SKIN: Warm and dry, no edema NEUROLOGIC:  No focal neuro deficits noted. PSYCHIATRIC:  Normal affect    Labs    Chemistry Recent Labs  Lab 08/02/22 1403 08/03/22 1439 08/03/22 1439 08/04/22 0535 08/05/22 0440 08/06/22 0812  NA  --  134*   < >  135 137 138  K  --  4.0   < > 3.9 3.8 3.8  CL  --  104   < > 104 106 106  CO2  --  23   < > 21* 24 23  GLUCOSE  --  133*   < > 104* 93 96  BUN  --  22   < > '19 23 22  '$ CREATININE  --  1.42*   < > 1.33* 1.47* 1.48*  CALCIUM  --  8.9   < > 8.6* 9.2 9.3  PROT 5.5* 6.4*  --   --   --   --   ALBUMIN  --  3.4*  --   --   --   --   AST  --  29  --   --   --   --   ALT  --  12  --   --   --   --   ALKPHOS  --  93  --   --   --   --   BILITOT  --  0.5  --   --   --   --   GFRNONAA  --  34*   < > 37* 33* 33*  ANIONGAP  --  7   < >  $'10 7 9   'T$ < > = values in this interval not displayed.     Hematology Recent Labs  Lab 08/04/22 0535 08/05/22 0440 08/05/22 2132 08/06/22 0813  WBC 6.2 6.3  --  4.4  RBC 2.32* 2.26*  --  2.71*  HGB 7.7* 7.4* 8.6* 8.8*  HCT 22.6* 22.1* 24.7* 25.7*  MCV 97.4 97.8  --  94.8  MCH 33.2 32.7  --  32.5  MCHC 34.1 33.5  --  34.2  RDW 15.5 15.5  --  17.1*  PLT 125* 126*  --  115*    Cardiac EnzymesNo results for input(s): "TROPONINI" in the last 168 hours. No results for input(s): "TROPIPOC" in the last 168 hours.   BNP Recent Labs  Lab 08/03/22 1439  BNP 708.7*     DDimer No results for input(s): "DDIMER" in the last 168 hours.   Radiology    DG Chest Portable 1 View  Result Date: 08/03/2022 IMPRESSION: 1. Cardiomegaly with increasing interstitial edema. 2. Right pleural effusion has resolved. 3. Small left pleural effusion is unchanged. Electronically Signed   By: Ronney Asters M.D.   On: 08/03/2022 17:42   CT ABDOMEN PELVIS W CONTRAST  Result Date: 08/03/2022 IMPRESSION: Bibasilar effusions and atelectasis greater on LEFT. Small hiatal hernia. Sigmoid diverticulosis without evidence of diverticulitis. Mild LEFT renal collecting system dilatation without obstructing calculus or ureteral dilatation. No acute intra-abdominal or intrapelvic abnormalities. Aortic Atherosclerosis (ICD10-I70.0). Electronically Signed   By: Lavonia Dana M.D.   On: 08/03/2022  16:37    Cardiac Studies   2D echo 07/21/2022: 1. Left ventricular ejection fraction, by estimation, is 60 to 65%. The  left ventricle has normal function. The left ventricle has no regional  wall motion abnormalities. There is moderate left ventricular hypertrophy.  Left ventricular diastolic  parameters are consistent with Grade II diastolic dysfunction  (pseudonormalization).   2. Right ventricular systolic function is normal. The right ventricular  size is normal. There is severely elevated pulmonary artery systolic  pressure. The estimated right ventricular systolic pressure is 29.5 mmHg.   3. The mitral valve is normal in structure. Mild to moderate mitral valve  regurgitation. Moderate mitral stenosis by estimated mean mitral valve  gradient is 9.5 mmHg. There does not appear to be significant stenosis by  estimated MVA (3.67 cm sq) and  visually appears to open well. Moderate mitral annular calcification.   4. Tricuspid valve regurgitation is moderate.   5. The aortic valve has an indeterminant number of cusps. There is  moderate calcification of the aortic valve. Aortic valve regurgitation is  not visualized. Mild to moderate aortic valve stenosis. Aortic valve area,  by VTI measures 1.26 cm. Aortic  valve mean gradient measures 12.8 mmHg. Aortic valve Vmax measures 2.39  m/s.   6. The inferior vena cava is normal in size with greater than 50%  respiratory variability, suggesting right atrial pressure of 3 mmHg.   7. Rhythm is normal sinus.  __________   2D echo 12/03/2017 Jefm Bryant): INTERPRETATION  NORMAL LEFT VENTRICULAR SYSTOLIC FUNCTION   WITH MODERATE LVH  NORMAL RIGHT VENTRICULAR SYSTOLIC FUNCTION  MILD VALVULAR REGURGITATION (See above)  NO VALVULAR STENOSIS  MILD TO MODERATE MR  MILD TR, PR  MILD PHTN  SCLEROTIC AoV  EF >55%  __________   2D echo 04/13/2016 Jefm Bryant): INTERPRETATION  NORMAL LEFT VENTRICULAR SYSTOLIC FUNCTION WITH AN ESTIMATED EF = 65 %   NORMAL RIGHT VENTRICULAR SYSTOLIC FUNCTION  MODERATE MITRAL VALVE INSUFFICIENCY  MILD-TO-MODERATE  TRICUSPID VALVE INSUFFICIENCY  NO VALVULAR STENOSIS  MILD LA ENLARGEMENT  MILD LVH  SCLEROTIC AORTIC VALVE   Patient Profile     88 y.o. female with history of SVT/atrial tachycardia, PAD status post angioplasty of the SFA and popliteal arteries in 03/2019 followed by vascular surgery with most recent ABI from 10/2021 unchanged and normal, carotid artery stenosis, aortic atherosclerosis, mitral regurgitation, difficult to control HTN with normal renal artery ultrasound in 11/2021, HLD, hypothyroidism, and anemia who is being seen today for the evaluation of HFpEF in the setting of symptomatic anemia at the request of Dr. Damita Dunnings .  Assessment & Plan    Acute on chronic diastolic heart failure in the setting of symptomatic anemia -with Cr and euvolemic appearance, hold lasix Does have O2 requirement but this may be exacerbated by her anemia. -additional transfusion threshold per primary team and hematology. Would give IV lasix if transfusion required, defer to primary team/hematology -No indication for echo (07/21/2022)   Elevated high sensitivity troponin: -elevated troponin likely demand ischemia, no chest pain -In the setting of no chest pain and symptomatic anemia, no plans for inpatient ischemic evaluation -No ASA or heparin gtt at this time with anemia   Mild to moderate aortic stenosis/moderate mitral stenosis: -Outpatient follow up   Symptomatic anemia with guaiac positive stool PAD -Per IM/GI/hematology -outpatient clopidogrel on hold given anemia, per recent notes no plans for endoscopy while admitted  Stanly will sign off.   Medication Recommendations:  continue clonidine, losartan. Holding home amlodipine, can restart if needed for BP management. Clopidogrel on hold until GI evaluation. Would resume PRN lasix at discharge, do not anticipate she will need a  standing dose. Ok to continue current metoprolol dose at discharge. Other recommendations (labs, testing, etc):  none Follow up as an outpatient:  She has follow up with Darylene Price on 08/11/22 and Dr. Rockey Situ on 08/22/22   For questions or updates, please contact [place] Please consult [email or web address] for contact info under Cardiology/STEMI.    Signed, Buford Dresser, MD, PhD, [facility] [address] [place], [place] [address] [phone]  08/06/2022, 2:24 PM

## 2022-08-07 ENCOUNTER — Encounter: Payer: Self-pay | Admitting: Internal Medicine

## 2022-08-07 ENCOUNTER — Telehealth: Payer: Self-pay

## 2022-08-07 ENCOUNTER — Inpatient Hospital Stay: Payer: Medicare PPO

## 2022-08-07 DIAGNOSIS — D649 Anemia, unspecified: Secondary | ICD-10-CM | POA: Diagnosis not present

## 2022-08-07 LAB — CBC WITH DIFFERENTIAL/PLATELET
Abs Immature Granulocytes: 0.06 10*3/uL (ref 0.00–0.07)
Basophils Absolute: 0 10*3/uL (ref 0.0–0.1)
Basophils Relative: 0 %
Eosinophils Absolute: 0.1 10*3/uL (ref 0.0–0.5)
Eosinophils Relative: 1 %
HCT: 26.3 % — ABNORMAL LOW (ref 36.0–46.0)
Hemoglobin: 8.9 g/dL — ABNORMAL LOW (ref 12.0–15.0)
Immature Granulocytes: 1 %
Lymphocytes Relative: 15 %
Lymphs Abs: 0.6 10*3/uL — ABNORMAL LOW (ref 0.7–4.0)
MCH: 32.4 pg (ref 26.0–34.0)
MCHC: 33.8 g/dL (ref 30.0–36.0)
MCV: 95.6 fL (ref 80.0–100.0)
Monocytes Absolute: 0.3 10*3/uL (ref 0.1–1.0)
Monocytes Relative: 6 %
Neutro Abs: 3.4 10*3/uL (ref 1.7–7.7)
Neutrophils Relative %: 77 %
Platelets: 121 10*3/uL — ABNORMAL LOW (ref 150–400)
RBC: 2.75 MIL/uL — ABNORMAL LOW (ref 3.87–5.11)
RDW: 16.5 % — ABNORMAL HIGH (ref 11.5–15.5)
WBC: 4.4 10*3/uL (ref 4.0–10.5)
nRBC: 0 % (ref 0.0–0.2)

## 2022-08-07 LAB — BASIC METABOLIC PANEL
Anion gap: 9 (ref 5–15)
BUN: 23 mg/dL (ref 8–23)
CO2: 24 mmol/L (ref 22–32)
Calcium: 9.4 mg/dL (ref 8.9–10.3)
Chloride: 106 mmol/L (ref 98–111)
Creatinine, Ser: 1.52 mg/dL — ABNORMAL HIGH (ref 0.44–1.00)
GFR, Estimated: 32 mL/min — ABNORMAL LOW (ref >60)
Glucose, Bld: 102 mg/dL — ABNORMAL HIGH (ref 70–99)
Potassium: 3.9 mmol/L (ref 3.5–5.1)
Sodium: 139 mmol/L (ref 135–145)

## 2022-08-07 LAB — KAPPA/LAMBDA LIGHT CHAINS
Kappa, lambda light chain ratio: 6536.46 — ABNORMAL HIGH (ref 0.26–1.65)
Lambda free light chains: 9.6 mg/L (ref 5.7–26.3)

## 2022-08-07 LAB — HAPTOGLOBIN: Haptoglobin: 124 mg/dL (ref 41–333)

## 2022-08-07 MED ORDER — FUROSEMIDE 10 MG/ML IJ SOLN
40.0000 mg | Freq: Once | INTRAMUSCULAR | Status: AC
  Administered 2022-08-07: 40 mg via INTRAVENOUS
  Filled 2022-08-07: qty 4

## 2022-08-07 MED ORDER — OXYMETAZOLINE HCL 0.05 % NA SOLN
1.0000 | Freq: Two times a day (BID) | NASAL | Status: AC
  Administered 2022-08-07 – 2022-08-09 (×4): 1 via NASAL
  Filled 2022-08-07: qty 15

## 2022-08-07 MED ORDER — SALINE SPRAY 0.65 % NA SOLN
1.0000 | NASAL | Status: DC | PRN
  Administered 2022-08-09: 1 via NASAL
  Filled 2022-08-07: qty 44

## 2022-08-07 NOTE — Evaluation (Addendum)
Physical Therapy Evaluation Patient Details Name: Deanna White MRN: [id number] DOB: Feb 13, 1925 Today's Date: 08/07/2022  History of Present Illness  88 yo female presenting with anemia.PMH HTN, B12 deficiency, right-sided heart failure, mitral valve regurgitation,  HTN, aortic stenosis, hyperlipidemia, anemia, mitral stenosis  Clinical Impression  Pt found in chair upon PT entry. Pt pleasant and HOH. Sit<>Stand CGA, RW, 3L O2 via [place],and min VC for RW mechanics. Pt ambulated 252f with CGA, RW, and 3L O2 via [place]. O2 stats remained above 90% throughout mobility. Pt would benefit from skilled physical therapy to address listed deficits (see below) to increase independence with ADLs and improve QoL. Current recommendation is HHPT to return pt to PLOF.       Recommendations for follow up therapy are one component of a multi-disciplinary discharge planning process, led by the attending physician.  Recommendations may be updated based on patient status, additional functional criteria and insurance authorization.  Follow Up Recommendations Home health PT      Assistance Recommended at Discharge PRN  Patient can return home with the following  Assistance with cooking/housework;Assist for transportation;Help with stairs or ramp for entrance;A little help with bathing/dressing/bathroom;A little help with walking and/or transfers;Direct supervision/assist for medications management    Equipment Recommendations Rolling walker (2 wheels)  Recommendations for Other Services       Functional Status Assessment       Precautions / Restrictions Precautions Precautions: Fall;Other (comment) Precaution Comments: monitor SP02 Restrictions Weight Bearing Restrictions: No      Mobility  Bed Mobility                    Transfers Overall transfer level: Needs assistance Equipment used: Rolling walker (2 wheels) Transfers: Sit to/from Stand Sit to Stand: Min guard                 Ambulation/Gait Ambulation/Gait assistance: Min guard Gait Distance (Feet): 220 Feet Assistive device: Rolling walker (2 wheels) (3 L O2 via [place]) Gait Pattern/deviations: Step-through pattern, Decreased step length - right, Decreased step length - left, Decreased stride length, Decreased dorsiflexion - right, Decreased dorsiflexion - left       General Gait Details: required min VC for RW mechanics  Stairs            Wheelchair Mobility    Modified Rankin (Stroke Patients Only)       Balance Overall balance assessment: Needs assistance Sitting-balance support: Feet supported, No upper extremity supported Sitting balance-Leahy Scale: Good     Standing balance support: Bilateral upper extremity supported Standing balance-Leahy Scale: Fair                               Pertinent Vitals/Pain Pain Assessment Pain Assessment: No/denies pain    Home Living Family/patient expects to be discharged to:: Private residence Living Arrangements: Alone Available Help at Discharge: Family;Available PRN/intermittently Type of Home: House Home Access: Stairs to enter Entrance Stairs-Rails: None Entrance Stairs-Number of Steps: 2     Home Equipment: Shower seat      Prior Function Prior Level of Function : Independent/Modified Independent;Driving             Mobility Comments: At baseline, patient reports she does not use any AD to ambulate ADLs Comments: independent with ADLs     Hand Dominance        Extremity/Trunk Assessment   Upper Extremity Assessment Upper Extremity Assessment: Overall WFL for  tasks assessed    Lower Extremity Assessment Lower Extremity Assessment: Generalized weakness    Cervical / Trunk Assessment Cervical / Trunk Assessment: Normal  Communication   Communication: HOH  Cognition Arousal/Alertness: Awake/alert Behavior During Therapy: WFL for tasks assessed/performed Overall Cognitive Status: Within Functional  Limits for tasks assessed                                 General Comments: hard of hearing        General Comments      Exercises     Assessment/Plan    PT Assessment Patient needs continued PT services  PT Problem List Decreased strength;Decreased activity tolerance       PT Treatment Interventions DME instruction;Functional mobility training;Balance training;Patient/family education;Gait training;Therapeutic activities;Neuromuscular re-education;Stair training;Therapeutic exercise    PT Goals (Current goals can be found in the Care Plan section)  Acute Rehab PT Goals Patient Stated Goal: to return home PT Goal Formulation: With patient Time For Goal Achievement: 08/21/22 Potential to Achieve Goals: Good    Frequency Min 2X/week     Co-evaluation               AM-PAC PT "6 Clicks" Mobility  Outcome Measure Help needed turning from your back to your side while in a flat bed without using bedrails?: A Little Help needed moving from lying on your back to sitting on the side of a flat bed without using bedrails?: A Little Help needed moving to and from a bed to a chair (including a wheelchair)?: A Little Help needed standing up from a chair using your arms (e.g., wheelchair or bedside chair)?: A Little Help needed to walk in hospital room?: A Little Help needed climbing 3-5 steps with a railing? : A Little 6 Click Score: 18    End of Session Equipment Utilized During Treatment: Gait belt;Oxygen (3 L O2 via [place]) Activity Tolerance: Patient tolerated treatment well Patient left: in chair;with call bell/phone within reach;with chair alarm set;with family/visitor present Nurse Communication: Mobility status PT Visit Diagnosis: Unsteadiness on feet (R26.81);Muscle weakness (generalized) (M62.81)    Time: 4628-6381 PT Time Calculation (min) (ACUTE ONLY): 30 min   Charges:   PT Evaluation $PT Eval Low Complexity: 1 Low PT Treatments $Therapeutic  Activity: 8-22 mins          [employer], SPT 08/07/2022, 4:17 PM

## 2022-08-07 NOTE — Telephone Encounter (Signed)
Received call from [place] from [facility] IR Dept to inform us that she has cancelled pt's upcoming appt tomorrow on 08/08/22 due to pt being currently admitted '@[facility]'$  Rm#[id number]. Pt is sch for bone marrow bx tomorrow '@9am'$  order by Dr.Rao oncology. They just wanted to make you aware of pt's current situation if you have any further questions you can reach out to [facility] directly '@[phone]

## 2022-08-07 NOTE — Care Management Important Message (Signed)
Important Message  Patient Details  Name: KAMILA BRODA MRN: [id number] Date of Birth: 05-07-1925   Medicare Important Message Given:  Yes     [name] 08/07/2022, 12:57 PM

## 2022-08-07 NOTE — Progress Notes (Signed)
   08/07/22 0900  Clinical Encounter Type  Visited With Patient and family together  Visit Type Initial  Referral From Nurse  Consult/Referral To Chaplain   Chaplain responded to request for prayer before procedure. Chaplain provided a compassionate presence and reflective listening as patient and daughter spoke about health challenges. Chaplain services are available for follow up as needed.

## 2022-08-07 NOTE — Evaluation (Signed)
Occupational Therapy Evaluation Patient Details Name: Deanna White MRN: [id number] DOB: 06/02/1925 Today's Date: 08/07/2022   History of Present Illness Deanna White is a 88 y.o. female with anemia of chronic disease presented with severe symptomatic anemia.  She does have history of chronic thrombocytopenia dating back to 05/2014.   Clinical Impression   Patient presenting with decreased independence in self-care, functional; mobility, safety, strength, and endurance. Patient reports she lives at home alone and will have PRN assistance at discharge. Patient reports she has a walk-in shower, comfort height toilet, and does not use any AD to ambulate. At baseline patient reports she is independent with all ADLs  and IADLs. Patient currently functioning at supervision for bed mobility supine<>sit. Transferred sitting EOB>stand with min guard no AD used. Patient ambulated ~30 to bathroom 1-person hand-held assist to bathroom, 1 LOB observed. Transferred to toilet and performed per-care with min guard. SP02 monitored throughout session: (Supine) 3L SP02 99%, (Sitting EOB) decreased to 2L SP02 95%, (Ambulating) decreased 1L SP02 76%. SP02 increased back to 3L 92%. Nurse notified. Patient left in bed with call bell in reach, bed alarm set, and all needs met. Patient will benefit from acute OT to increase overall independence in the areas of ADLs, functional mobility, in order to safely discharge to the next venue of care.       Recommendations for follow up therapy are one component of a multi-disciplinary discharge planning process, led by the attending physician.  Recommendations may be updated based on patient status, additional functional criteria and insurance authorization.   Follow Up Recommendations  Skilled nursing-short term rehab (<3 hours/day)    Assistance Recommended at Discharge Intermittent Supervision/Assistance  Patient can return home with the following A little help with walking  and/or transfers;Help with stairs or ramp for entrance;A little help with bathing/dressing/bathroom;Assistance with cooking/housework    Functional Status Assessment  Patient has had a recent decline in their functional status and demonstrates the ability to make significant improvements in function in a reasonable and predictable amount of time.  Equipment Recommendations  Other (comment) (Defer to next venue of care.)       Precautions / Restrictions Precautions Precautions: Fall;Other (comment) Precaution Comments: monitor SP02 Restrictions Weight Bearing Restrictions: No      Mobility Bed Mobility Overal bed mobility: Needs Assistance Bed Mobility: Supine to Sit, Sit to Supine     Supine to sit: Supervision Sit to supine: Supervision        Transfers Overall transfer level: Needs assistance Equipment used: None Transfers: Sit to/from Stand Sit to Stand: Min guard           General transfer comment: 1-person hand held assist to ambulate to bathroom, 1 LOB.      Balance Overall balance assessment: Needs assistance Sitting-balance support: Feet supported, No upper extremity supported Sitting balance-Leahy Scale: Good     Standing balance support: Single extremity supported, During functional activity Standing balance-Leahy Scale: Fair                             ADL either performed or assessed with clinical judgement   ADL Overall ADL's : Needs assistance/impaired                         Toilet Transfer: Min guard   Toileting- Clothing Manipulation and Hygiene: Min guard       Functional mobility during ADLs: Supervision/safety;Min guard  Vision Baseline Vision/History: 1 Wears glasses Patient Visual Report: No change from baseline              Pertinent Vitals/Pain Pain Assessment Pain Assessment: No/denies pain     Hand Dominance Right   Extremity/Trunk Assessment Upper Extremity Assessment Upper  Extremity Assessment: Generalized weakness   Lower Extremity Assessment Lower Extremity Assessment: Defer to PT evaluation       Communication Communication Communication: HOH   Cognition Arousal/Alertness: Awake/alert Behavior During Therapy: WFL for tasks assessed/performed Overall Cognitive Status: Within Functional Limits for tasks assessed                                                  Home Living Family/patient expects to be discharged to:: Private residence Living Arrangements: Alone Available Help at Discharge: Family;Available PRN/intermittently Type of Home: House Home Access: Stairs to enter [employer] of Steps: 3 Entrance Stairs-Rails: None       Bathroom Shower/Tub: Occupational psychologist: Handicapped height     Home Equipment: None          Prior Functioning/Environment Prior Level of Function : Independent/Modified Independent;Driving             Mobility Comments: At baseline, patient reports she does not use any AD to ambulate ADLs Comments: Patient reports independent with all ADL and is able to make most meals on her own.        OT Problem List: Decreased strength;Decreased safety awareness;Decreased activity tolerance      OT Treatment/Interventions: Self-care/ADL training;Therapeutic activities;Therapeutic exercise;Energy conservation;Patient/family education    OT Goals(Current goals can be found in the care plan section) Acute Rehab OT Goals Patient Stated Goal: to return home. OT Goal Formulation: With patient/family Time For Goal Achievement: 08/21/22 Potential to Achieve Goals: Good ADL Goals Pt Will Perform Lower Body Bathing: with modified independence Pt Will Perform Lower Body Dressing: with modified independence Pt Will Transfer to Toilet: with modified independence Pt Will Perform Toileting - Clothing Manipulation and hygiene: with modified independence  OT Frequency: Min  2X/week       AM-PAC OT "6 Clicks" Daily Activity     Outcome Measure Help from another person eating meals?: None Help from another person taking care of personal grooming?: None Help from another person toileting, which includes using toliet, bedpan, or urinal?: A Little Help from another person bathing (including washing, rinsing, drying)?: A Little Help from another person to put on and taking off regular upper body clothing?: None Help from another person to put on and taking off regular lower body clothing?: A Little 6 Click Score: 21   End of Session Equipment Utilized During Treatment: Gait belt Nurse Communication: Mobility status (SP02)  Activity Tolerance: Patient tolerated treatment well Patient left: in bed;with call bell/phone within reach;with bed alarm set  OT Visit Diagnosis: Muscle weakness (generalized) (M62.81)                Time: 4098-1191 OT Time Calculation (min): 24 min Charges:  OT General Charges $OT Visit: 1 Visit OT Evaluation $OT Eval Moderate Complexity: 1 Mod OT Treatments $Self Care/Home Management : 8-22 mins    Deanna White, OTS 08/07/2022, 12:16 PM

## 2022-08-07 NOTE — Consult Note (Signed)
Chief Complaint: Patient was seen in consultation today for symptomatic anemia with unclear etiology at the request of Randa Evens, MD Referring Physician(s): Randa Evens, MD  Supervising Physician: Michaelle Birks  Patient Status: [facility] - In-pt  History of Present Illness: Deanna White is a 88 y.o. female presented to ED at the request of her PCP d/t worsening anemia and exacerbation of CHF. ED workup showed guaiac positive stool, hypoxia and slightly worsening kidney function. Pt is not overtly iron deficient with B12 and folate WNL. Due to acute symptomatic anemia with unclear etiology, pt has been referred to IR for bone marrow biopsy by Randa Evens, MD. Procedure tentatively scheduled 08/08/22.   Pt denies fever, chills, CP or N/V. She endorses SOB, fatigue, weakness and mild headache.  Pt understands that she will be NPO at [facility].   Past Medical History:  Diagnosis Date   Actinic keratosis    Allergy    Anemia 02/25/2015   Basal cell carcinoma 06/01/2021   R temple - ED&C   Carotid artery occlusion    Colon polyps    Fall from slipping on ice 11/2013   Heart murmur    Hypertension    Menopause    Pneumonia    Positive TB test    pt denies   Sore throat    Squamous cell carcinoma of skin 04/17/2019   L post lateral ankle, shave removal 10/15/2019   Thyroid disease    Varicose veins     Past Surgical History:  Procedure Laterality Date   ABDOMINAL HYSTERECTOMY     APPENDECTOMY     BILATERAL SALPINGOOPHORECTOMY  1964   CESAREAN SECTION     EYE SURGERY Right March 2016   Cataract   LOWER EXTREMITY ANGIOGRAPHY Left 03/14/2019   Procedure: LOWER EXTREMITY ANGIOGRAPHY;  Surgeon: Katha Cabal, MD;  Location: [facility];  Service: Cardiovascular;  Laterality: Left;   TONSILLECTOMY     VEIN SURGERY Left    Leg    Allergies: Hctz [hydrochlorothiazide], Adhesive [tape], and Brimonidine  Medications: Prior to Admission medications   Medication Sig  Start Date End Date Taking? Authorizing Provider  amLODipine (NORVASC) 5 MG tablet Take 0.5 tab by mouth every AM, and take 1 tab  by mouth every PM 06/19/22  Yes Dunn, Areta Haber, PA-C  cloNIDine (CATAPRES) 0.1 MG tablet 1 tablet in the am and 2 in the evening Patient taking differently: 1 tablet in the am and 1 In the evening 12/29/21  Yes Crecencio Mc, MD  clopidogrel (PLAVIX) 75 MG tablet TAKE 1 TABLET BY MOUTH EVERY DAY 07/12/22  Yes Kris Hartmann, NP  dorzolamide (TRUSOPT) 2 % ophthalmic solution Place 1 drop into both eyes 3 (three) times daily.  07/18/16  Yes [provider]  levothyroxine (SYNTHROID) 75 MCG tablet TAKE 1 TABLET BY MOUTH EVERY DAY 07/03/22  Yes Crecencio Mc, MD  losartan (COZAAR) 100 MG tablet TAKE 1 TABLET BY MOUTH EVERY DAY 01/24/22  Yes Crecencio Mc, MD  Multiple Vitamins-Minerals (PRESERVISION AREDS 2) CAPS Take 1 capsule by mouth 2 (two) times daily.    Yes [provider]  cyanocobalamin (,VITAMIN B-12,) 1000 MCG/ML injection Inject 1 mL (1,000 mcg total) into the muscle every 30 (thirty) days. 02/13/22   Crecencio Mc, MD  fluticasone (FLONASE) 50 MCG/ACT nasal spray Place 2 sprays into both nostrils daily. 07/28/22   Crecencio Mc, MD  furosemide (LASIX) 20 MG tablet Take 1 tablet (20  mg total) by mouth as needed (As needed for shortness of breath or weight gain.). As needed for swellling 07/27/22   Dunn, Areta Haber, PA-C  ketoconazole (NIZORAL) 2 % cream Apply 1 Application topically at bedtime. 06/01/22 05/21/24  Ralene Bathe, MD  NEEDLE, DISP, 25 G 25G X 1" MISC Use to give b12 injection once monthly. 02/13/22   Crecencio Mc, MD     Family History  Problem Relation Age of Onset   Heart disease Mother        After age 65   Hypertension Mother    Deep vein thrombosis Mother    Heart attack Mother    Kidney disease Father    Alcohol abuse Brother    Heart disease Brother    Cancer Brother        Lung cancer   Cancer Brother         kidney cancer   Varicose Veins Sister    Heart disease Sister        After age 63   Heart attack Sister    Heart disease Sister        After age 79   Other Other        epilepsy   Hypertension Other    Cancer Paternal Grandmother        breast cancer   Cancer Daughter     Social History   Socioeconomic History   Marital status: Widowed    Spouse name: Not on file   Number of children: Not on file   Years of education: Not on file   Highest education level: Not on file  Occupational History   Not on file  Tobacco Use   Smoking status: Never   Smokeless tobacco: Never  Substance and Sexual Activity   Alcohol use: No   Drug use: No   Sexual activity: Never  Other Topics Concern   Not on file  Social History Narrative   Not on file   Social Determinants of Health   Financial Resource Strain: Low Risk  (09/27/2020)   Overall Financial Resource Strain (CARDIA)    Difficulty of Paying Living Expenses: Not hard at all  Food Insecurity: No Food Insecurity (09/27/2020)   Hunger Vital Sign    Worried About Running Out of Food in the Last Year: Never true    [place] in the Last Year: Never true  Transportation Needs: No Transportation Needs (09/27/2020)   PRAPARE - Hydrologist (Medical): No    Lack of Transportation (Non-Medical): No  Physical Activity: Not on file  Stress: No Stress Concern Present (09/27/2020)   [place]    Feeling of Stress : Not at all  Social Connections: Unknown (09/27/2020)   Social Connection and Isolation Panel [NHANES]    Frequency of Communication with Friends and Family: More than three times a week    Frequency of Social Gatherings with Friends and Family: More than three times a week    Attends Religious Services: Not on Advertising copywriter or Organizations: Not on file    Attends Archivist Meetings: Not on file     Marital Status: Not on file    Review of Systems: A 12 point ROS discussed and pertinent positives are indicated in the HPI above.  All other systems are negative.  Review of Systems  Constitutional:  Positive for activity  change and fatigue. Negative for appetite change, chills and fever.  Respiratory:  Positive for shortness of breath.   Cardiovascular:  Negative for chest pain and leg swelling.  Gastrointestinal:  Negative for abdominal pain, nausea and vomiting.  Neurological:  Positive for weakness and headaches. Negative for dizziness.    Vital Signs: BP (!) 129/41 (BP Location: Right Arm)   Pulse 88   Temp 97.6 F (36.4 C) (Oral)   Resp 14   Ht _0  (1.575 m)   Wt 108 lb 6.4 oz (49.2 kg)   SpO2 100%   BMI 19.83 kg/m     Physical Exam Vitals reviewed.  Constitutional:      General: She is not in acute distress.    Appearance: Normal appearance. She is not ill-appearing.  HENT:     Head: Normocephalic and atraumatic.     Mouth/Throat:     Mouth: Mucous membranes are moist.     Pharynx: Oropharynx is clear.  Eyes:     Extraocular Movements: Extraocular movements intact.     Pupils: Pupils are equal, round, and reactive to light.  Cardiovascular:     Rate and Rhythm: Normal rate and regular rhythm.     Pulses: Normal pulses.     Heart sounds: Murmur heard.  Pulmonary:     Effort: Pulmonary effort is normal. No respiratory distress.     Breath sounds: Normal breath sounds.  Abdominal:     General: Bowel sounds are normal. There is no distension.     Palpations: Abdomen is soft.     Tenderness: There is no abdominal tenderness. There is no guarding.  Musculoskeletal:     Right lower leg: No edema.     Left lower leg: No edema.  Neurological:     Mental Status: She is alert and oriented to person, place, and time.  Psychiatric:        Mood and Affect: Mood normal.        Behavior: Behavior normal.        Thought Content: Thought content normal.         Judgment: Judgment normal.     Imaging: DG Chest Port 1 View  Result Date: 08/07/2022 CLINICAL DATA:  Provided history: Hypoxia. EXAM: PORTABLE CHEST 1 VIEW COMPARISON:  Prior chest radiographs 08/03/2022 and earlier. FINDINGS: Cardiomegaly. Aortic atherosclerosis. Small bilateral pleural effusions, increased on the right, and similar on the left. Associated bibasilar atelectasis and/or consolidation. Prominence of the interstitial lung markings. While a component of this is chronic, superimposed interstitial edema is suspected. New ill-defined opacity within the right upper lobe, which may reflect atelectasis, asymmetric edema or pneumonia. No evidence of pneumothorax. Spondylosis and levocurvature of the thoracic spine. IMPRESSION: Cardiomegaly with persistent interstitial edema. New ill-defined opacity within the right upper lobe, which may reflect atelectasis, asymmetric edema or pneumonia. Small bilateral pleural effusions. The right pleural effusion has increased from the prior examination of 08/03/2022. Associated bibasilar atelectasis and/or consolidation. Aortic Atherosclerosis (ICD10-I70.0). Electronically Signed   By: Kellie Simmering D.O.   On: 08/07/2022 08:52   DG Chest Portable 1 View  Result Date: 08/03/2022 CLINICAL DATA:  Weakness EXAM: PORTABLE CHEST 1 VIEW COMPARISON:  Chest x-ray 05/05/2020 FINDINGS: Heart is enlarged. Right pleural effusion has resolved. Small left pleural effusion is unchanged. There is some minimal patchy opacities in the lung bases. Central interstitial prominence has increased. There is no pneumothorax. No acute fractures are seen. IMPRESSION: 1. Cardiomegaly with increasing interstitial edema. 2. Right pleural effusion  has resolved. 3. Small left pleural effusion is unchanged. Electronically Signed   By: Ronney Asters M.D.   On: 08/03/2022 17:42   CT ABDOMEN PELVIS W CONTRAST  Result Date: 08/03/2022 CLINICAL DATA:  Anemia, RIGHT lower quadrant abdominal pain,  history hypertension TAHBSO, appendectomy EXAM: CT ABDOMEN AND PELVIS WITH CONTRAST TECHNIQUE: Multidetector CT imaging of the abdomen and pelvis was performed using the standard protocol following bolus administration of intravenous contrast. RADIATION DOSE REDUCTION: This exam was performed according to the departmental dose-optimization program which includes automated exposure control, adjustment of the mA and/or kV according to patient size and/or use of iterative reconstruction technique. CONTRAST:  67m OMNIPAQUE IOHEXOL 300 MG/ML SOLN IV. No oral contrast. COMPARISON:  None Available. FINDINGS: Lower chest: Bibasilar effusions and atelectasis greater on LEFT. Mild enlargement of cardiac chambers. Small hiatal hernia. Hepatobiliary: Gallbladder and liver normal appearance Pancreas: Normal appearance Spleen: Irregular spleen with calcifications question sequela of trauma or infarct. No mass. Adrenals/Urinary Tract: Adrenal glands unremarkable. Mild LEFT renal collecting system dilatation without obstructing calculus or ureteral dilatation. RIGHT kidney and RIGHT ureter unremarkable. Bladder normal appearance. Stomach/Bowel: Appendix surgically absent by history. Scattered stool throughout colon. Elongated J-shaped stomach with small hiatal hernia. Sigmoid diverticulosis. Remaining bowel loops unremarkable. Vascular/Lymphatic: Atherosclerotic calcifications aorta, iliac arteries, visceral arteries. Aorta normal caliber. Scattered pelvic phleboliths. No adenopathy. Reproductive: Uterus and ovaries surgically absent Other: No free air or free fluid. No hernia or inflammatory process. Musculoskeletal: Osseous demineralization. Superior endplate compression deformity L1, unchanged since 05/05/2020. Trace LEFT hip joint effusion. No acute osseous findings. IMPRESSION: Bibasilar effusions and atelectasis greater on LEFT. Small hiatal hernia. Sigmoid diverticulosis without evidence of diverticulitis. Mild LEFT renal  collecting system dilatation without obstructing calculus or ureteral dilatation. No acute intra-abdominal or intrapelvic abnormalities. Aortic Atherosclerosis (ICD10-I70.0). Electronically Signed   By: MLavonia DanaM.D.   On: 08/03/2022 16:37   ECHOCARDIOGRAM COMPLETE  Result Date: 07/21/2022    ECHOCARDIOGRAM REPORT   Patient Name:   BGAYLEEN SHOLTZDate of Exam: 07/21/2022 Medical Rec #:  [id number]    Height:       62.0 in Accession #:    [id number]   Weight:       115.5 lb Date of Birth:  1Sep 26, 1926   BSA:          1.514 m Patient Age:    89years      BP:           120/6454 mmHg Patient Gender: F             HR:           78 bpm. Exam Location:  [facility] Procedure: 2D Echo, Color Doppler and Cardiac Doppler Indications:    I48.0 Paroxysmal atrial fibrillation; R01.1 Murmur  History:        Patient has no prior history of Echocardiogram examinations.                 LVH, PAD and COPD, Arrythmias:Atrial Fibrillation,                 Signs/Symptoms:Murmur and Dizziness/Lightheadedness; Risk                 Factors:Dyslipidemia, Non-Smoker and Hypertension.  Sonographer:    GPilar JarvisRDMS, RVT, RDCS Referring Phys: [id number] Mu Sonographer Comments: Image acquisition challenging due to respiratory motion. Very limited acoustic windows IMPRESSIONS  1. Left ventricular ejection fraction, by estimation, is 60 to  65%. The left ventricle has normal function. The left ventricle has no regional wall motion abnormalities. There is moderate left ventricular hypertrophy. Left ventricular diastolic parameters are consistent with Grade II diastolic dysfunction (pseudonormalization).  2. Right ventricular systolic function is normal. The right ventricular size is normal. There is severely elevated pulmonary artery systolic pressure. The estimated right ventricular systolic pressure is 39.0 mmHg.  3. The mitral valve is normal in structure. Mild to moderate mitral valve regurgitation. Moderate mitral stenosis by  estimated mean mitral valve gradient is 9.5 mmHg. There does not appear to be significant stenosis by estimated MVA (3.67 cm sq) and visually appears to open well. Moderate mitral annular calcification.  4. Tricuspid valve regurgitation is moderate.  5. The aortic valve has an indeterminant number of cusps. There is moderate calcification of the aortic valve. Aortic valve regurgitation is not visualized. Mild to moderate aortic valve stenosis. Aortic valve area, by VTI measures 1.26 cm. Aortic valve mean gradient measures 12.8 mmHg. Aortic valve Vmax measures 2.39 m/s.  6. The inferior vena cava is normal in size with greater than 50% respiratory variability, suggesting right atrial pressure of 3 mmHg.  7. Rhythm is normal sinus. FINDINGS  Left Ventricle: Left ventricular ejection fraction, by estimation, is 60 to 65%. The left ventricle has normal function. The left ventricle has no regional wall motion abnormalities. The left ventricular internal cavity size was normal in size. There is  moderate left ventricular hypertrophy. Left ventricular diastolic parameters are consistent with Grade II diastolic dysfunction (pseudonormalization). Right Ventricle: The right ventricular size is normal. No increase in right ventricular wall thickness. Right ventricular systolic function is normal. There is severely elevated pulmonary artery systolic pressure. The tricuspid regurgitant velocity is 3.84 m/s, and with an assumed right atrial pressure of 5 mmHg, the estimated right ventricular systolic pressure is 30.0 mmHg. Left Atrium: Left atrial size was normal in size. Right Atrium: Right atrial size was normal in size. Pericardium: There is no evidence of pericardial effusion. Mitral Valve: The mitral valve is normal in structure. There is mild calcification of the mitral valve leaflet(s). Moderate mitral annular calcification. Mild to moderate mitral valve regurgitation. Moderate mitral valve stenosis. MV peak gradient,  20.2 mmHg. The mean mitral valve gradient is 9.5 mmHg. Tricuspid Valve: The tricuspid valve is normal in structure. Tricuspid valve regurgitation is moderate . No evidence of tricuspid stenosis. Aortic Valve: The aortic valve has an indeterminant number of cusps. There is moderate calcification of the aortic valve. Aortic valve regurgitation is not visualized. Mild to moderate aortic stenosis is present. Aortic valve mean gradient measures 12.8 mmHg. Aortic valve peak gradient measures 22.8 mmHg. Aortic valve area, by VTI measures 1.26 cm. Pulmonic Valve: The pulmonic valve was normal in structure. Pulmonic valve regurgitation is trivial. No evidence of pulmonic stenosis. Aorta: The aortic root is normal in size and structure. Venous: The inferior vena cava is normal in size with greater than 50% respiratory variability, suggesting right atrial pressure of 3 mmHg. IAS/Shunts: No atrial level shunt detected by color flow Doppler.  LEFT VENTRICLE PLAX 2D LVIDd:         3.50 cm     Diastology LVIDs:         1.70 cm     LV e' medial:    5.11 cm/s LV PW:         1.20 cm     LV E/e' medial:  38.2 LV IVS:        1.40  cm     LV e' lateral:   4.13 cm/s LVOT diam:     1.70 cm     LV E/e' lateral: 47.2 LV SV:         70 LV SV Index:   46 LVOT Area:     2.27 cm  LV Volumes (MOD) LV vol d, MOD A4C: 64.8 ml LV vol s, MOD A4C: 24.7 ml LV SV MOD A4C:     64.8 ml RIGHT VENTRICLE             IVC RV S prime:     16.20 cm/s  IVC diam: 2.50 cm TAPSE (M-mode): 2.5 cm LEFT ATRIUM             Index LA diam:        3.60 cm 2.38 cm/m LA Vol (A2C):   67.3 ml 44.46 ml/m LA Vol (A4C):   48.0 ml 31.71 ml/m LA Biplane Vol: 57.4 ml 37.92 ml/m  AORTIC VALVE                     PULMONIC VALVE AV Area (Vmax):    1.31 cm      PV Vmax:          1.06 m/s AV Area (Vmean):   1.29 cm      PV Peak grad:     4.5 mmHg AV Area (VTI):     1.26 cm      PR End Diast Vel: 5.48 msec AV Vmax:           239.00 cm/s AV Vmean:          164.250 cm/s AV VTI:             0.558 m AV Peak Grad:      22.8 mmHg AV Mean Grad:      12.8 mmHg LVOT Vmax:         138.00 cm/s LVOT Vmean:        93.200 cm/s LVOT VTI:          0.310 m LVOT/AV VTI ratio: 0.56  AORTA Ao Root diam: 2.80 cm Ao Asc diam:  3.30 cm MITRAL VALVE                TRICUSPID VALVE MV Area (PHT): 3.72 cm     TR Peak grad:   59.0 mmHg MV Area VTI:   1.16 cm     TR Vmax:        384.00 cm/s MV Peak grad:  20.2 mmHg MV Mean grad:  9.5 mmHg     SHUNTS MV Vmax:       2.24 m/s     Systemic VTI:  0.31 m MV Vmean:      142.0 cm/s   Systemic Diam: 1.70 cm MV Decel Time: 204 msec MV E velocity: 195.00 cm/s MV A velocity: 181.00 cm/s MV E/A ratio:  1.08 Ida Rogue MD Electronically signed by Ida Rogue MD Signature Date/Time: 07/21/2022/5:39:22 PM    Final     Labs:  CBC: Recent Labs    08/04/22 0535 08/05/22 0440 08/05/22 2132 08/06/22 0813 08/07/22 0957  WBC 6.2 6.3  --  4.4 4.4  HGB 7.7* 7.4* 8.6* 8.8* 8.9*  HCT 22.6* 22.1* 24.7* 25.7* 26.3*  PLT 125* 126*  --  115* 121*    COAGS: No results for input(s): "INR", "APTT" in the last 8760 hours.  BMP: Recent Labs    08/04/22 0535 08/05/22 0440  08/06/22 0812 08/07/22 0957  NA 135 137 138 139  K 3.9 3.8 3.8 3.9  CL 104 106 106 106  CO2 21* _0 GLUCOSE 104* 93 96 102*  BUN _1 CALCIUM 8.6* 9.2 9.3 9.4  CREATININE 1.33* 1.47* 1.48* 1.52*  GFRNONAA 37* 33* 33* 32*    LIVER FUNCTION TESTS: Recent Labs    12/29/21 1008 01/05/22 1046 06/19/22 1023 08/02/22 1403 08/03/22 1439  BILITOT 0.3  --  0.7  --  0.5  AST 17  --  22  --  29  ALT 11  --  10  --  12  ALKPHOS 94  --  75  --  93  PROT 6.0 6.0* 6.3* 5.5* 6.4*  ALBUMIN 3.8  --  3.7  --  3.4*    TUMOR MARKERS: No results for input(s): "AFPTM", "CEA", "CA199", "CHROMGRNA" in the last 8760 hours.  Assessment and Plan: 88 yo female presents to IR for bone marrow biopsy due to acute symptomatic anemia with unclear etiology.   Pt sitting up in recliner.  She  is A&O, calm and pleasant. She is in no distress. Pt daughter at bedside. Last Plavix 08/03/22  Risks and benefits of bone marrow biopsy and aspiration with moderate sedation was discussed with the patient and/or patient's family including, but not limited to bleeding, infection, damage to adjacent structures or low yield requiring additional tests.  All of the questions were answered and there is agreement to proceed.  Consent signed and in chart.  Thank you for this interesting consult.  I greatly enjoyed meeting Deanna White and look forward to participating in their care.  A copy of this report was sent to the requesting provider on this date.  Electronically Signed: Tyson Alias, NP 08/07/2022, 3:09 PM   I spent a total of 20 minutes in face to face in clinical consultation, greater than 50% of which was counseling/coordinating care for symptomatic anemia.

## 2022-08-07 NOTE — Progress Notes (Signed)
PROGRESS NOTE    Deanna White  VPX:[id number] DOB: 1925-02-13 DOA: 08/03/2022 PCP: Crecencio Mc, MD    Brief Narrative:  88 year old female who was sent to the emergency room by her primary care physician due to worsening anemia of unknown etiology.  Patient had a drop in hemoglobin from 12 to mid sevens.  On presentation there was also concern for mild exacerbation of patient's preserved ejection fraction heart failure.  She is placed on gentle IV diuresis per cardiology.  No plans for ischemic evaluation or echocardiogram.   Patient has hypoxia as well, currently on 3-4L nasal cannula.  Likely due to fluid overload/decompensated HF   Patient also has symptomatic anemia.  Guaiac positive stool in the ED.  Folate, B12 within normal limits.  Iron indices seem to indicate anemia of inflammation rather than true iron deficiency but patient may benefit from IV iron.  Will await GI recommendations and evaluation.  Patient may warrant endoscopy during this admission.   Patient is on Plavix.  I believe this is part of her treatment for peripheral arterial disease.  1/4: Case discussed with hematology and GI.  GI endoscopic evaluation on hold for now.  Iron indices, B12, folate seem to indicate a diagnosis of anemia of chronic disease.  However diagnosis is somewhat unclear.  Iron indices do not indicate acute nor chronic blood loss.  Tentative plan for bone marrow biopsy Monday 11/6.  Kidney function slightly worse.  Patient remains on IV diuretics per cardiology.  She remains on supplemental oxygen though improved from prior.  Currently on 2 L nasal cannula.  Hemoglobin decreased to 7.4 from 7.9 on admission.  Considering chronic comorbidities we will transfuse to goal hemoglobin of 8.  11/5: Received 1 unit PRBC with appropriate rise in hemoglobin.   Assessment & Plan:   Principal Problem:   Symptomatic anemia Active Problems:   Acute on chronic diastolic CHF (congestive heart failure)  (HCC)   Iron deficiency anemia due to chronic blood loss   PAD (peripheral artery disease) (HCC)   Benign essential hypertension   Hypothyroidism   Aortic stenosis   Anemia of chronic disease   Acute decompensated heart failure (HCC)  Acute symptomatic anemia Guaiac positive stools Unclear etiology.  Iron indices seem to indicate anemia of chronic disease.  Patient not overtly iron deficient.  B12 and folate also within normal limits.  No evidence of active bleed other than questionable intermittent dark stools.  However she is on Plavix and has been so on several years. Transfused 1 unit PRBC on 11/4 Plan: At this time no plans for inpatient endoscopic evaluation.  Hematology on board.  Bone marrow biopsy 11/7.  Continue holding Plavix.    Acute decompensated diastolic congestive heart failure Acute hypoxic respiratory failure secondary to above Patient did have evidence of fluid overload though no lower extremity edema noted.  Elevated BNP and interstitial edema seen on x-ray.  Likely that hypoxic respiratory failure was driven by fluid overload/decompensated heart failure.  The patient has no baseline requirement.  She had a pulse oximetry of 86% on room air on arrival.  Has required up to 4 L nasal cannula since admission.  Currently on 2 L.  Likely that symptomatic anemia drove mild exacerbation of heart failure.  Cardiology discontinued Lasix on 11/4.  Last dose on 11/3. Plan: 76m IV lasix x 1 Nasal cannula, wean as tolerated No indication for repeat echo Therapy evaluations, ambulate as tolerated  Elevated high-sensitivity troponin Unlikely to represent ACS.  No chest pain.  No significant delta.  Suspect supply demand ischemia in the setting of symptomatic anemia.  Aspirin and heparin gtt. contraindicated in the setting of worsening anemia.  Mild to moderate valvulopathy Patient with history of mild aortic stenosis mild mitral stenosis.  Will continue home metoprolol.   Outpatient cardiology follow-up.  Peripheral arterial disease Continue holding Plavix  Hypothyroidism PTA Synthroid  Essential hypertension Resume home clonidine Lasix as above PTA metoprolol Hold losartan  AKI Creatinine normal at baseline.  Elevated to 1.47 on 11/4. Suspect prerenal azotemia versus ATN Daily BMP Avoid nephrotoxins    DVT prophylaxis: SCD Code Status: Full Family Communication: Daughter at bedside 11/4, 11/5, 11/6 Disposition Plan: Status is: Inpatient Remains inpatient appropriate because: Anemia, hypoxic respiratory failure, plan for inpatient bone marrow biopsy 11/7     Level of care: Progressive  Consultants:  GI Cardiology Hematology  Procedures:  None  Antimicrobials: None   Subjective: Seen and examined. Sitting in bed eating breakfast.  Appears more fatigued.  Objective: Vitals:   08/06/22 2200 08/06/22 2304 08/07/22 0506 08/07/22 0815  BP: (!) 116/40 (!) 120/44 (!) 120/37 (!) 113/45  Pulse:  83 80 81  Resp:  _0 Temp:  99.1 F (37.3 C) 98.4 F (36.9 C) 97.8 F (36.6 C)  TempSrc:  Oral    SpO2:  97% 98% 95%  Weight:   49.2 kg   Height:        Intake/Output Summary (Last 24 hours) at 08/07/2022 1220 Last data filed at 08/07/2022 0906 Gross per 24 hour  Intake 480 ml  Output 1500 ml  Net -1020 ml   Filed Weights   08/05/22 0715 08/06/22 0500 08/07/22 0506  Weight: 50.2 kg 46.7 kg 49.2 kg    Examination:  General exam: NAD Respiratory system: Bibasilar crackles.  Normal work of breathing.  3 L Cardiovascular system: S1-S2 RRR, 2/6 murmur, no pitting edema Gastrointestinal system: Soft, NT/ND, normal bowel sounds Central nervous system: Alert and oriented. No focal neurological deficits. Extremities: Symmetric 5 x 5 power. Skin: No rashes, lesions or ulcers Psychiatry: Judgement and insight appear normal. Mood & affect appropriate.     Data Reviewed: I have personally reviewed following labs and imaging  studies  CBC: Recent Labs  Lab 08/02/22 1403 08/03/22 1439 08/04/22 0535 08/05/22 0440 08/05/22 2132 08/06/22 0813 08/07/22 0957  WBC 4.4 4.9  4.8 6.2 6.3  --  4.4 4.4  NEUTROABS 3.2 3.4  --  4.7  --  3.5 3.4  HGB 7.5 Repeated and verified X2.* 7.9*  7.9* 7.7* 7.4* 8.6* 8.8* 8.9*  HCT 22.2 Repeated and verified X2.* 23.8*  23.8* 22.6* 22.1* 24.7* 25.7* 26.3*  MCV 98.8 99.2  99.6 97.4 97.8  --  94.8 95.6  PLT 118.0* 128*  130* 125* 126*  --  115* 297*   Basic Metabolic Panel: Recent Labs  Lab 08/03/22 1439 08/04/22 0535 08/05/22 0440 08/06/22 0812 08/07/22 0957  NA 134* 135 137 138 139  K 4.0 3.9 3.8 3.8 3.9  CL 104 104 106 106 106  CO2 23 21* _1 GLUCOSE 133* 104* 93 96 102*  BUN _2 CREATININE 1.42* 1.33* 1.47* 1.48* 1.52*  CALCIUM 8.9 8.6* 9.2 9.3 9.4  MG  --   --  2.3 2.0  --    GFR: Estimated Creatinine Clearance: 17.6 mL/min (A) (by C-G formula based on SCr of 1.52 mg/dL (H)). Liver Function Tests: Recent Labs  Lab 08/02/22 1403 08/03/22 1439  AST  --  29  ALT  --  12  ALKPHOS  --  93  BILITOT  --  0.5  PROT 5.5* 6.4*  ALBUMIN  --  3.4*   Recent Labs  Lab 08/03/22 1439  LIPASE 40   No results for input(s): "AMMONIA" in the last 168 hours. Coagulation Profile: No results for input(s): "INR", "PROTIME" in the last 168 hours. Cardiac Enzymes: No results for input(s): "CKTOTAL", "CKMB", "CKMBINDEX", "TROPONINI" in the last 168 hours. BNP (last 3 results) No results for input(s): "PROBNP" in the last 8760 hours. HbA1C: No results for input(s): "HGBA1C" in the last 72 hours. CBG: No results for input(s): "GLUCAP" in the last 168 hours. Lipid Profile: No results for input(s): "CHOL", "HDL", "LDLCALC", "TRIG", "CHOLHDL", "LDLDIRECT" in the last 72 hours. Thyroid Function Tests: No results for input(s): "TSH", "T4TOTAL", "FREET4", "T3FREE", "THYROIDAB" in the last 72 hours. Anemia Panel: No results for input(s): "VITAMINB12",  "FOLATE", "FERRITIN", "TIBC", "IRON", "RETICCTPCT" in the last 72 hours.  Sepsis Labs: No results for input(s): "PROCALCITON", "LATICACIDVEN" in the last 168 hours.  Recent Results (from the past 240 hour(s))  SARS Coronavirus 2 by RT PCR (hospital order, performed in [facility] hospital lab) *cepheid single result test* Anterior Nasal Swab     Status: None   Collection Time: 08/03/22  5:07 PM   Specimen: Anterior Nasal Swab  Result Value Ref Range Status   SARS Coronavirus 2 by RT PCR NEGATIVE NEGATIVE Final    Comment: (NOTE) SARS-CoV-2 target nucleic acids are NOT DETECTED.  The SARS-CoV-2 RNA is generally detectable in upper and lower respiratory specimens during the acute phase of infection. The lowest concentration of SARS-CoV-2 viral copies this assay can detect is 250 copies / mL. A negative result does not preclude SARS-CoV-2 infection and should not be used as the sole basis for treatment or other patient management decisions.  A negative result may occur with improper specimen collection / handling, submission of specimen other than nasopharyngeal swab, presence of viral mutation(s) within the areas targeted by this assay, and inadequate number of viral copies (<250 copies / mL). A negative result must be combined with clinical observations, patient history, and epidemiological information.  Fact Sheet for Patients:   https://www.patel.info/  Fact Sheet for Healthcare Providers: https://hall.com/  This test is not yet approved or  cleared by the [place] FDA and has been authorized for detection and/or diagnosis of SARS-CoV-2 by FDA under an Emergency Use Authorization (EUA).  This EUA will remain in effect (meaning this test can be used) for the duration of the COVID-19 declaration under Section 564(b)(1) of the Act, 21 U.S.C. section 360bbb-3(b)(1), unless the authorization is terminated or revoked sooner.  Performed  at [facility], [address] [place], [place] [address]          Radiology Studies: [facility] Chest [place] 1 View  Result Date: 08/07/2022 CLINICAL DATA:  Provided history: Hypoxia. EXAM: PORTABLE CHEST 1 VIEW COMPARISON:  Prior chest radiographs 08/03/2022 and earlier. FINDINGS: Cardiomegaly. Aortic atherosclerosis. Small bilateral pleural effusions, increased on the right, and similar on the left. Associated bibasilar atelectasis and/or consolidation. Prominence of the interstitial lung markings. While a component of this is chronic, superimposed interstitial edema is suspected. New ill-defined opacity within the right upper lobe, which may reflect atelectasis, asymmetric edema or pneumonia. No evidence of pneumothorax. Spondylosis and levocurvature of the thoracic spine. IMPRESSION: Cardiomegaly with persistent interstitial edema. New ill-defined opacity within  the right upper lobe, which may reflect atelectasis, asymmetric edema or pneumonia. Small bilateral pleural effusions. The right pleural effusion has increased from the prior examination of 08/03/2022. Associated bibasilar atelectasis and/or consolidation. Aortic Atherosclerosis (ICD10-I70.0). Electronically Signed   By: Kellie Simmering D.O.   On: 08/07/2022 08:52        Scheduled Meds:  cloNIDine  0.1 mg Oral BID   dorzolamide  1 drop Both Eyes TID   fluticasone  2 spray Each Nare Daily   levothyroxine  75 mcg Oral Q0600   losartan  100 mg Oral Daily   metoprolol tartrate  12.5 mg Oral BID   oxymetazoline  1 spray Each Nare BID   Continuous Infusions:   LOS: 2 days    Sidney Ace, MD [facility]   If 7PM-7AM, please contact night-coverage  08/07/2022, 12:20 PM

## 2022-08-07 NOTE — TOC CM/SW Note (Signed)
TOC following due to patient's acute oxygen needs. No other needs identified so far.  Deanna White, [place]

## 2022-08-08 ENCOUNTER — Ambulatory Visit: Payer: Medicare PPO | Admitting: Internal Medicine

## 2022-08-08 ENCOUNTER — Inpatient Hospital Stay: Payer: Medicare PPO

## 2022-08-08 DIAGNOSIS — D649 Anemia, unspecified: Secondary | ICD-10-CM | POA: Diagnosis not present

## 2022-08-08 LAB — PROCALCITONIN: Procalcitonin: <0.1 ng/mL

## 2022-08-08 MED ORDER — GUAIFENESIN-DM 100-10 MG/5ML PO SYRP
5.0000 mL | ORAL_SOLUTION | ORAL | Status: DC | PRN
  Administered 2022-08-08 – 2022-08-10 (×3): 5 mL via ORAL
  Filled 2022-08-08 (×3): qty 10

## 2022-08-08 MED ORDER — FUROSEMIDE 10 MG/ML IJ SOLN
40.0000 mg | Freq: Once | INTRAMUSCULAR | Status: AC
  Administered 2022-08-08: 40 mg via INTRAVENOUS
  Filled 2022-08-08: qty 4

## 2022-08-08 NOTE — Progress Notes (Signed)
PROGRESS NOTE    Deanna White  NOB:[id number] DOB: [birth year] DOA: 08/03/2022 PCP: Crecencio Mc, MD    Brief Narrative:  88 year old female who was sent to the emergency room by her primary care physician due to worsening anemia of unknown etiology.  Patient had a drop in hemoglobin from 12 to mid sevens.  On presentation there was also concern for mild exacerbation of patient's preserved ejection fraction heart failure.  She is placed on gentle IV diuresis per cardiology.  No plans for ischemic evaluation or echocardiogram.   Patient has hypoxia as well, currently on 3-4L nasal cannula.  Likely due to fluid overload/decompensated HF   Patient also has symptomatic anemia.  Guaiac positive stool in the ED.  Folate, B12 within normal limits.  Iron indices seem to indicate anemia of inflammation rather than true iron deficiency but patient may benefit from IV iron.  Will await GI recommendations and evaluation.  Patient may warrant endoscopy during this admission.   Patient is on Plavix.  I believe this is part of her treatment for peripheral arterial disease.  1/4: Case discussed with hematology and GI.  GI endoscopic evaluation on hold for now.  Iron indices, B12, folate seem to indicate a diagnosis of anemia of chronic disease.  However diagnosis is somewhat unclear.  Iron indices do not indicate acute nor chronic blood loss.  Tentative plan for bone marrow biopsy Monday 11/6.  Kidney function slightly worse.  Patient remains on IV diuretics per cardiology.  She remains on supplemental oxygen though improved from prior.  Currently on 2 L nasal cannula.  Hemoglobin decreased to 7.4 from 7.9 on admission.  Considering chronic comorbidities we will transfuse to goal hemoglobin of 8.  11/5: Received 1 unit PRBC with appropriate rise in hemoglobin. 11/7: Bone marrow bx cancelled due to hypoxia   Assessment & Plan:   Principal Problem:   Symptomatic anemia Active Problems:   Acute on  chronic diastolic CHF (congestive heart failure) (HCC)   Iron deficiency anemia due to chronic blood loss   PAD (peripheral artery disease) (HCC)   Benign essential hypertension   Hypothyroidism   Aortic stenosis   Anemia of chronic disease   Acute decompensated heart failure (HCC)  Acute symptomatic anemia Guaiac positive stools Unclear etiology.  Iron indices seem to indicate anemia of chronic disease.  Patient not overtly iron deficient.  B12 and folate also within normal limits.  No evidence of active bleed other than questionable intermittent dark stools.  However she is on Plavix and has been so on several years. Transfused 1 unit PRBC on 11/4 Plan: At this time no plans for inpatient endoscopic evaluation.   Hematology on board.   Bone marrow bx was scheduled for today, cancelled due to hypoxia   Continue holding Plavix.    Acute decompensated diastolic congestive heart failure Acute hypoxic respiratory failure secondary to above Patient did have evidence of fluid overload though no lower extremity edema noted.  Elevated BNP and interstitial edema seen on x-ray.  Likely that hypoxic respiratory failure was driven by fluid overload/decompensated heart failure.  The patient has no baseline requirement.  She had a pulse oximetry of 86% on room air on arrival.  Has required up to 4 L nasal cannula since admission.  Currently on 2 L.  Likely that symptomatic anemia drove mild exacerbation of heart failure.  Cardiology discontinued Lasix on 11/4.  Last dose on 11/3. Plan: Repeat 40 IV lasix x 1 Nasal cannula, wean as  tolerated No indication for repeat echo Therapy evaluations, ambulate as tolerated  Elevated high-sensitivity troponin Unlikely to represent ACS.  No chest pain.  No significant delta.  Suspect supply demand ischemia in the setting of symptomatic anemia.  Aspirin and heparin gtt. contraindicated in the setting of worsening anemia.  Mild to moderate valvulopathy Patient  with history of mild aortic stenosis mild mitral stenosis.  Will continue home metoprolol.  Outpatient cardiology follow-up.  Peripheral arterial disease Continue holding Plavix  Hypothyroidism PTA Synthroid  Essential hypertension Resume home clonidine Lasix as above PTA metoprolol Hold losartan  AKI Creatinine normal at baseline.  Elevated to 1.47 on 11/4. Suspect prerenal azotemia versus ATN Daily BMP Avoid nephrotoxins    DVT prophylaxis: SCD Code Status: Full Family Communication: Daughter at bedside 11/4, 11/5, 11/6, 11/7 Disposition Plan: Status is: Inpatient Remains inpatient appropriate because: Anemia, hypoxic respiratory failure, will attempt to wean from oxygen     Level of care: Progressive  Consultants:  GI Cardiology Hematology  Procedures:  None  Antimicrobials: None   Subjective: Seen and examined. Sitting in bed eating breakfast.  Appears more fatigued.  Objective: Vitals:   08/08/22 0800 08/08/22 0900 08/08/22 1040 08/08/22 1221  BP:  (!) 145/46  (!) 116/53  Pulse:  85  79  Resp:  17  18  Temp:  98.6 F (37 C)  98.3 F (36.8 C)  TempSrc:  Oral    SpO2: 90% 100% 96% 98%  Weight:      Height:        Intake/Output Summary (Last 24 hours) at 08/08/2022 1336 Last data filed at 08/08/2022 1228 Gross per 24 hour  Intake --  Output 400 ml  Net -400 ml   Filed Weights   08/07/22 0506 08/08/22 0500 08/08/22 0718  Weight: 49.2 kg 47 kg 47 kg    Examination:  General exam: No acute distress Respiratory system: left side crackles, normal WOB, 3L Cardiovascular system: S1-S2 RRR, 2/6 murmur, no pitting edema Gastrointestinal system: Soft, NT/ND, normal bowel sounds Central nervous system: Alert and oriented. No focal neurological deficits. Extremities: Symmetric 5 x 5 power. Skin: No rashes, lesions or ulcers Psychiatry: Judgement and insight appear normal. Mood & affect appropriate.     Data Reviewed: I have personally  reviewed following labs and imaging studies  CBC: Recent Labs  Lab 08/02/22 1403 08/03/22 1439 08/04/22 0535 08/05/22 0440 08/05/22 2132 08/06/22 0813 08/07/22 0957  WBC 4.4 4.9  4.8 6.2 6.3  --  4.4 4.4  NEUTROABS 3.2 3.4  --  4.7  --  3.5 3.4  HGB 7.5 Repeated and verified X2.* 7.9*  7.9* 7.7* 7.4* 8.6* 8.8* 8.9*  HCT 22.2 Repeated and verified X2.* 23.8*  23.8* 22.6* 22.1* 24.7* 25.7* 26.3*  MCV 98.8 99.2  99.6 97.4 97.8  --  94.8 95.6  PLT 118.0* 128*  130* 125* 126*  --  115* 440*   Basic Metabolic Panel: Recent Labs  Lab 08/03/22 1439 08/04/22 0535 08/05/22 0440 08/06/22 0812 08/07/22 0957  NA 134* 135 137 138 139  K 4.0 3.9 3.8 3.8 3.9  CL 104 104 106 106 106  CO2 23 21* _0 GLUCOSE 133* 104* 93 96 102*  BUN _1 CREATININE 1.42* 1.33* 1.47* 1.48* 1.52*  CALCIUM 8.9 8.6* 9.2 9.3 9.4  MG  --   --  2.3 2.0  --    GFR: Estimated Creatinine Clearance: 16.8 mL/min (A) (by C-G formula based on  SCr of 1.52 mg/dL (H)). Liver Function Tests: Recent Labs  Lab 08/02/22 1403 08/03/22 1439  AST  --  29  ALT  --  12  ALKPHOS  --  93  BILITOT  --  0.5  PROT 5.5* 6.4*  ALBUMIN  --  3.4*   Recent Labs  Lab 08/03/22 1439  LIPASE 40   No results for input(s): "AMMONIA" in the last 168 hours. Coagulation Profile: No results for input(s): "INR", "PROTIME" in the last 168 hours. Cardiac Enzymes: No results for input(s): "CKTOTAL", "CKMB", "CKMBINDEX", "TROPONINI" in the last 168 hours. BNP (last 3 results) No results for input(s): "PROBNP" in the last 8760 hours. HbA1C: No results for input(s): "HGBA1C" in the last 72 hours. CBG: No results for input(s): "GLUCAP" in the last 168 hours. Lipid Profile: No results for input(s): "CHOL", "HDL", "LDLCALC", "TRIG", "CHOLHDL", "LDLDIRECT" in the last 72 hours. Thyroid Function Tests: No results for input(s): "TSH", "T4TOTAL", "FREET4", "T3FREE", "THYROIDAB" in the last 72 hours. Anemia Panel: No  results for input(s): "VITAMINB12", "FOLATE", "FERRITIN", "TIBC", "IRON", "RETICCTPCT" in the last 72 hours.  Sepsis Labs: No results for input(s): "PROCALCITON", "LATICACIDVEN" in the last 168 hours.  Recent Results (from the past 240 hour(s))  SARS Coronavirus 2 by RT PCR (hospital order, performed in [facility] hospital lab) *cepheid single result test* Anterior Nasal Swab     Status: None   Collection Time: 08/03/22  5:07 PM   Specimen: Anterior Nasal Swab  Result Value Ref Range Status   SARS Coronavirus 2 by RT PCR NEGATIVE NEGATIVE Final    Comment: (NOTE) SARS-CoV-2 target nucleic acids are NOT DETECTED.  The SARS-CoV-2 RNA is generally detectable in upper and lower respiratory specimens during the acute phase of infection. The lowest concentration of SARS-CoV-2 viral copies this assay can detect is 250 copies / mL. A negative result does not preclude SARS-CoV-2 infection and should not be used as the sole basis for treatment or other patient management decisions.  A negative result may occur with improper specimen collection / handling, submission of specimen other than nasopharyngeal swab, presence of viral mutation(s) within the areas targeted by this assay, and inadequate number of viral copies (<250 copies / mL). A negative result must be combined with clinical observations, patient history, and epidemiological information.  Fact Sheet for Patients:   https://www.patel.info/  Fact Sheet for Healthcare Providers: https://hall.com/  This test is not yet approved or  cleared by the [place] FDA and has been authorized for detection and/or diagnosis of SARS-CoV-2 by FDA under an Emergency Use Authorization (EUA).  This EUA will remain in effect (meaning this test can be used) for the duration of the COVID-19 declaration under Section 564(b)(1) of the Act, 21 U.S.C. section 360bbb-3(b)(1), unless the authorization is  terminated or revoked sooner.  Performed at [facility], [address], [place], [place] [address]          Radiology Studies: DG Chest [place] 1 View  Result Date: 08/08/2022 CLINICAL DATA:  Hypoxia EXAM: PORTABLE CHEST 1 VIEW COMPARISON:  08/07/2022 FINDINGS: Cardiomegaly. Aortic atherosclerosis. Widespread pulmonary density shows some worsening, possibly due to a combination of pleural fluid, lower lobe volume loss, edema and pneumonia. IMPRESSION: Worsening of widespread pulmonary density, possibly due to a combination of pleural fluid, lower lobe volume loss, edema and pneumonia. Electronically Signed   By: Nelson Chimes M.D.   On: 08/08/2022 10:26   DG Chest [place] 1 View  Result Date: 08/07/2022 CLINICAL DATA:  Provided history: Hypoxia. EXAM: PORTABLE CHEST 1 VIEW COMPARISON:  Prior chest radiographs 08/03/2022 and earlier. FINDINGS: Cardiomegaly. Aortic atherosclerosis. Small bilateral pleural effusions, increased on the right, and similar on the left. Associated bibasilar atelectasis and/or consolidation. Prominence of the interstitial lung markings. While a component of this is chronic, superimposed interstitial edema is suspected. New ill-defined opacity within the right upper lobe, which may reflect atelectasis, asymmetric edema or pneumonia. No evidence of pneumothorax. Spondylosis and levocurvature of the thoracic spine. IMPRESSION: Cardiomegaly with persistent interstitial edema. New ill-defined opacity within the right upper lobe, which may reflect atelectasis, asymmetric edema or pneumonia. Small bilateral pleural effusions. The right pleural effusion has increased from the prior examination of 08/03/2022. Associated bibasilar atelectasis and/or consolidation. Aortic Atherosclerosis (ICD10-I70.0). Electronically Signed   By: Kellie Simmering D.O.   On: 08/07/2022 08:52        Scheduled Meds:  cloNIDine  0.1 mg Oral BID   dorzolamide  1 drop Both Eyes TID   fluticasone   2 spray Each Nare Daily   levothyroxine  75 mcg Oral Q0600   losartan  100 mg Oral Daily   metoprolol tartrate  12.5 mg Oral BID   oxymetazoline  1 spray Each Nare BID   Continuous Infusions:   LOS: 3 days    Sidney Ace, MD [facility]   If 7PM-7AM, please contact night-coverage  08/08/2022, 1:36 PM

## 2022-08-08 NOTE — Progress Notes (Addendum)
Physical Therapy Treatment Patient Details Name: Deanna White MRN: [id number] DOB: Jul 21, 1925 Today's Date: 08/08/2022   History of Present Illness 88 yo female presenting with anemia.PMH HTN, B12 deficiency, right-sided heart failure, mitral valve regurgitation,  HTN, aortic stenosis, hyperlipidemia, anemia, mitral stenosis    PT Comments    Pt found supine in bed upon PT entry. Supine<>Sit with min assist. Sit<>stand with CGA and RW. Pt ambulated 160 ft with RW and CGA. Pt requested to return to room as she was feeling weak. Pt ambulated to bathroom in her room with CGA to sit<>stand with RW. O2 stats dropped to 86% on 3L during mobility and when pt was instructed to take a standing rest break with breathing cues O2 improved to >90%.Pt would benefit from skilled physical therapy to address listed deficits (see below) to increase independence with ADLs and function. Current recommendation is HHPT with assistance to return pt to PLOF.   Recommendations for follow up therapy are one component of a multi-disciplinary discharge planning process, led by the attending physician.  Recommendations may be updated based on patient status, additional functional criteria and insurance authorization.  Follow Up Recommendations  Home health PT     Assistance Recommended at Discharge Intermittent Supervision/Assistance  Patient can return home with the following Assistance with cooking/housework;Assist for transportation;Help with stairs or ramp for entrance;A little help with bathing/dressing/bathroom;A little help with walking and/or transfers;Direct supervision/assist for medications management   Equipment Recommendations  Rolling walker (2 wheels)    Recommendations for Other Services       Precautions / Restrictions Precautions Precautions: Fall;Other (comment) Precaution Comments: monitor SP02 Restrictions Weight Bearing Restrictions: No     Mobility  Bed Mobility Overal bed mobility:  Needs Assistance Bed Mobility: Supine to Sit, Sit to Supine     Supine to sit: Min assist Sit to supine: Min guard        Transfers Overall transfer level: Needs assistance Equipment used: Rolling walker (2 wheels) Transfers: Sit to/from Stand Sit to Stand: Min guard                Ambulation/Gait Ambulation/Gait assistance: Min guard Gait Distance (Feet): 160 Feet Assistive device: Rolling walker (2 wheels) Gait Pattern/deviations: Step-through pattern, Decreased step length - right, Decreased step length - left, Decreased stride length, Decreased dorsiflexion - right, Decreased dorsiflexion - left           Stairs             Wheelchair Mobility    Modified Rankin (Stroke Patients Only)       Balance Overall balance assessment: Needs assistance Sitting-balance support: Feet supported, No upper extremity supported Sitting balance-Leahy Scale: Good     Standing balance support: Bilateral upper extremity supported Standing balance-Leahy Scale: Fair                              Cognition Arousal/Alertness: Awake/alert Behavior During Therapy: WFL for tasks assessed/performed Overall Cognitive Status: Within Functional Limits for tasks assessed                                 General Comments: hard of hearing        Exercises      General Comments        Pertinent Vitals/Pain Pain Assessment Pain Assessment: No/denies pain    Home Living  Prior Function            PT Goals (current goals can now be found in the care plan section) Acute Rehab PT Goals Patient Stated Goal: to return home PT Goal Formulation: With patient Time For Goal Achievement: 08/21/22 Potential to Achieve Goals: Good Progress towards PT goals: Progressing toward goals    Frequency    Min 2X/week      PT Plan Current plan remains appropriate    Co-evaluation              AM-PAC  PT "6 Clicks" Mobility   Outcome Measure  Help needed turning from your back to your side while in a flat bed without using bedrails?: A Little Help needed moving from lying on your back to sitting on the side of a flat bed without using bedrails?: A Little Help needed moving to and from a bed to a chair (including a wheelchair)?: A Little Help needed standing up from a chair using your arms (e.g., wheelchair or bedside chair)?: A Little Help needed to walk in hospital room?: A Little Help needed climbing 3-5 steps with a railing? : A Little 6 Click Score: 18    End of Session Equipment Utilized During Treatment: Gait belt;Oxygen (3 L of O2 via [place]) Activity Tolerance: Patient tolerated treatment well Patient left: in bed;with bed alarm set;with family/visitor present;with call bell/phone within reach Nurse Communication: Mobility status PT Visit Diagnosis: Unsteadiness on feet (R26.81);Muscle weakness (generalized) (M62.81)     Time: 1040-1109 PT Time Calculation (min) (ACUTE ONLY): 29 min  Charges:  $Therapeutic Activity: 23-37 mins                        [employer], SPT 08/08/2022, 2:06 PM

## 2022-08-08 NOTE — TOC Initial Note (Addendum)
Transition of Care [facility]) - Initial/Assessment Note    Patient Details  Name: Deanna White MRN: [id number] Date of Birth: Nov 12, 1924  Transition of Care [facility]) CM/SW Contact:    Candie Chroman, LCSW Phone Number: 08/08/2022, 12:33 PM  Clinical Narrative: CSW met with patient. Sister at bedside. CSW introduced role and explained that PT recommendations would be discussed. She is agreeable to home health. Provided CMS scores for agencies that serve her zip code. She will review. CSW will follow up with preference this afternoon. DME recommendation for RW but patient prefers to use her rollator. She is still on acute oxygen. No further concerns. CSW encouraged patient to contact CSW as needed. CSW will continue to follow patient for support and facilitate return home once stable.                 Expected Discharge Plan: [place] Barriers to Discharge: Continued Medical Work up   Patient Goals and CMS Choice   CMS Medicare.gov Compare Post Acute Care list provided to:: Patient    Expected Discharge Plan and Services Expected Discharge Plan: [place] Choice: [place] arrangements for the past 2 months: Single Family Home                                      Prior Living Arrangements/Services Living arrangements for the past 2 months: Single Family Home Lives with:: Self Patient language and need for interpreter reviewed:: Yes Do you feel safe going back to the place where you live?: Yes      Need for Family Participation in Patient Care: Yes (Comment)     Criminal Activity/Legal Involvement Pertinent to Current Situation/Hospitalization: No - Comment as needed  Activities of Daily Living      Permission Sought/Granted Permission sought to share information with : Facility Art therapist granted to share information with : Yes, Verbal Permission Granted  Share Information with  NAME: [name]  Permission granted to share info w AGENCY: [place] granted to share info w Relationship: Sister  Permission granted to share info w Contact Information: [phone]  Emotional Assessment Appearance:: Appears stated age Attitude/Demeanor/Rapport: Engaged, Gracious Affect (typically observed): Accepting, Appropriate, Calm, Pleasant Orientation: : Oriented to Self, Oriented to Place, Oriented to  Time, Oriented to Situation Alcohol / Substance Use: Not Applicable Psych Involvement: No (comment)  Admission diagnosis:  Symptomatic anemia [D64.9] Acute on chronic right-sided congestive heart failure ([place]) [I50.813] Acute decompensated heart failure (HCC) [I50.9] Patient Active Problem List   Diagnosis Date Noted   Acute decompensated heart failure ([place]) 08/05/2022   Anemia of chronic disease 08/04/2022   Symptomatic anemia 08/03/2022   Aortic stenosis 08/03/2022   Iron deficiency anemia due to chronic blood loss 08/03/2022   Acute on chronic diastolic CHF (congestive heart failure) ([place]) 08/03/2022   Eustachian tube dysfunction, bilateral 07/30/2022   Osteoarthritis 07/30/2022   Wood splinter in palm of hand 01/22/2022   B12 deficiency 01/22/2022   Cough in adult 12/30/2021   Myalgia due to statin 07/01/2021   Pain due to onychomycosis of toenails of both feet 12/09/2020   Combined forms of age-related cataract of right eye 08/11/2020   Emphysema, unspecified ([place]) 06/10/2020   Aortic atherosclerosis ([place]) 05/12/2020   Chronic pain of left ankle 04/19/2020   Edema of left  lower extremity 04/19/2020   Varicella zoster 09/24/2019   Trichiasis of right upper eyelid 11/06/2018   Venous insufficiency of both lower extremities 04/17/2018   Near syncope 01/26/2018   Pseudophakia, left eye 01/16/2018   LVH (left ventricular hypertrophy) due to hypertensive disease, without heart failure 12/03/2017   Moderate mitral insufficiency 04/17/2017    Constipation 03/03/2017   Mixed hyperlipidemia 03/27/2016   Medicare annual wellness visit, subsequent 03/03/2016   Encounter for preventive health examination 03/03/2016   Age-related reticular degeneration of both retinas 12/22/2015   Nonexudative age-related macular degeneration 12/22/2015   Open angle with borderline findings and low glaucoma risk in both eyes 12/22/2015   Bilateral ocular hypertension 12/22/2015   Optic neuropathy, left 12/22/2015   B12 deficiency anemia 02/25/2015   Hypothyroidism 02/23/2015   Cataract (lens) fragments in eye following cataract surgery, left eye 01/20/2015   Senile osteoporosis 02/22/2014   Osteoporosis of vertebra 02/22/2014   Benign essential hypertension 02/20/2014   PAD (peripheral artery disease) ([place]) 09/15/2011   PCP:  Crecencio Mc, MD Pharmacy:   [id number] [place] [id number]: [phone]: [phone]    Social Determinants of Health (SDOH) Interventions    Readmission Risk Interventions     No data to display

## 2022-08-09 DIAGNOSIS — D649 Anemia, unspecified: Secondary | ICD-10-CM | POA: Diagnosis not present

## 2022-08-09 LAB — BASIC METABOLIC PANEL
Anion gap: 9 (ref 5–15)
BUN: 24 mg/dL — ABNORMAL HIGH (ref 8–23)
CO2: 24 mmol/L (ref 22–32)
Calcium: 9 mg/dL (ref 8.9–10.3)
Chloride: 103 mmol/L (ref 98–111)
Creatinine, Ser: 1.57 mg/dL — ABNORMAL HIGH (ref 0.44–1.00)
GFR, Estimated: 30 mL/min — ABNORMAL LOW (ref >60)
Glucose, Bld: 95 mg/dL (ref 70–99)
Potassium: 3.5 mmol/L (ref 3.5–5.1)
Sodium: 136 mmol/L (ref 135–145)

## 2022-08-09 LAB — CBC WITH DIFFERENTIAL/PLATELET
Abs Immature Granulocytes: 0.04 10*3/uL (ref 0.00–0.07)
Basophils Absolute: 0 10*3/uL (ref 0.0–0.1)
Basophils Relative: 0 %
Eosinophils Absolute: 0.1 10*3/uL (ref 0.0–0.5)
Eosinophils Relative: 1 %
HCT: 23.3 % — ABNORMAL LOW (ref 36.0–46.0)
Hemoglobin: 7.8 g/dL — ABNORMAL LOW (ref 12.0–15.0)
Immature Granulocytes: 1 %
Lymphocytes Relative: 16 %
Lymphs Abs: 0.7 10*3/uL (ref 0.7–4.0)
MCH: 32.4 pg (ref 26.0–34.0)
MCHC: 33.5 g/dL (ref 30.0–36.0)
MCV: 96.7 fL (ref 80.0–100.0)
Monocytes Absolute: 0.2 10*3/uL (ref 0.1–1.0)
Monocytes Relative: 6 %
Neutro Abs: 3.2 10*3/uL (ref 1.7–7.7)
Neutrophils Relative %: 76 %
Platelets: 90 10*3/uL — ABNORMAL LOW (ref 150–400)
RBC: 2.41 MIL/uL — ABNORMAL LOW (ref 3.87–5.11)
RDW: 15.7 % — ABNORMAL HIGH (ref 11.5–15.5)
WBC: 4.1 10*3/uL (ref 4.0–10.5)
nRBC: 0 % (ref 0.0–0.2)

## 2022-08-09 LAB — MAGNESIUM: Magnesium: 2.2 mg/dL (ref 1.7–2.4)

## 2022-08-09 MED ORDER — FUROSEMIDE 10 MG/ML IJ SOLN
40.0000 mg | Freq: Once | INTRAMUSCULAR | Status: AC
  Administered 2022-08-09: 40 mg via INTRAVENOUS
  Filled 2022-08-09: qty 4

## 2022-08-09 MED ORDER — POTASSIUM CHLORIDE CRYS ER 20 MEQ PO TBCR
40.0000 meq | EXTENDED_RELEASE_TABLET | Freq: Once | ORAL | Status: AC
  Administered 2022-08-09: 40 meq via ORAL
  Filled 2022-08-09: qty 2

## 2022-08-09 NOTE — TOC Progression Note (Signed)
Transition of Care [facility]) - Progression Note    Patient Details  Name: Deanna White MRN: [id number] Date of Birth: [birth year]  Transition of Care [facility]) CM/SW [place], LCSW Phone Number: 08/09/2022, 11:07 AM  Clinical Narrative:   Met with patient and daughter. They have not decided on home health agency yet. Daughter is going to call some family/friends that have had home health in the past and see if they have worked with agencies that accept [employer] Gave them my phone number to follow up with decision once made. They are interested in adding an aide as well.  Expected Discharge Plan: [place] Barriers to Discharge: Continued Medical Work up  Expected Discharge Plan and Services Expected Discharge Plan: [place] Choice: [place] arrangements for the past 2 months: Single Family Home                                       Social Determinants of Health (SDOH) Interventions    Readmission Risk Interventions     No data to display

## 2022-08-09 NOTE — Progress Notes (Signed)
PROGRESS NOTE    Deanna White  PJS:[id number] DOB: December 09, 1924 DOA: 08/03/2022 PCP: Crecencio Mc, MD    Brief Narrative:  88 year old female who was sent to the emergency room by her primary care physician due to worsening anemia of unknown etiology.  Patient had a drop in hemoglobin from 12 to mid sevens.  On presentation there was also concern for mild exacerbation of patient's preserved ejection fraction heart failure.  She is placed on gentle IV diuresis per cardiology.  No plans for ischemic evaluation or echocardiogram.   Patient has hypoxia as well, currently on 3-4L nasal cannula.  Likely due to fluid overload/decompensated HF   Patient also has symptomatic anemia.  Guaiac positive stool in the ED.  Folate, B12 within normal limits.  Iron indices seem to indicate anemia of inflammation rather than true iron deficiency but patient may benefit from IV iron.  Will await GI recommendations and evaluation.  Patient may warrant endoscopy during this admission.   Patient is on Plavix.  I believe this is part of her treatment for peripheral arterial disease.  1/4: Case discussed with hematology and GI.  GI endoscopic evaluation on hold for now.  Iron indices, B12, folate seem to indicate a diagnosis of anemia of chronic disease.  However diagnosis is somewhat unclear.  Iron indices do not indicate acute nor chronic blood loss.  Tentative plan for bone marrow biopsy Monday 11/6.  Kidney function slightly worse.  Patient remains on IV diuretics per cardiology.  She remains on supplemental oxygen though improved from prior.  Currently on 2 L nasal cannula.  Hemoglobin decreased to 7.4 from 7.9 on admission.  Considering chronic comorbidities we will transfuse to goal hemoglobin of 8.  11/5: Received 1 unit PRBC with appropriate rise in hemoglobin. 11/7: Bone marrow bx cancelled due to hypoxia 11/8: Remains hypoxic, but slowly improving   Assessment & Plan:   Principal Problem:    Symptomatic anemia Active Problems:   Acute on chronic diastolic CHF (congestive heart failure) (HCC)   Iron deficiency anemia due to chronic blood loss   PAD (peripheral artery disease) (HCC)   Benign essential hypertension   Hypothyroidism   Aortic stenosis   Anemia of chronic disease   Acute decompensated heart failure (HCC)  Acute symptomatic anemia Guaiac positive stools Unclear etiology.  Iron indices seem to indicate anemia of chronic disease.  Patient not overtly iron deficient.  B12 and folate also within normal limits.  No evidence of active bleed other than questionable intermittent dark stools.  However she is on Plavix and has been so on several years. Transfused 1 unit PRBC on 11/4 Plan: At this time no plans for inpatient endoscopic evaluation.   Hematology on board.   Bone marrow biopsy on hold pending improvement in hypoxia Continue holding Plavix.    Acute decompensated diastolic congestive heart failure Acute hypoxic respiratory failure secondary to above Patient did have evidence of fluid overload though no lower extremity edema noted.  Elevated BNP and interstitial edema seen on x-ray.  Likely that hypoxic respiratory failure was driven by fluid overload/decompensated heart failure.  The patient has no baseline requirement.  She had a pulse oximetry of 86% on room air on arrival.  Has required up to 4 L nasal cannula since admission.  Currently on 2 L.  Likely that symptomatic anemia drove mild exacerbation of heart failure.  Cardiology discontinued Lasix on 11/4.  Last dose on 11/3. Received another dose of Lasix on 11/7 Plan: Repeat  40 IV lasix x 1 Continue supplemental oxygen, wean as tolerated Stress incentive spirometry use No indication for repeat echo Therapy evaluations, ambulate as tolerated Plan for discharge home with home health  Elevated high-sensitivity troponin Unlikely to represent ACS.  No chest pain.  No significant delta.  Suspect supply  demand ischemia in the setting of symptomatic anemia.  Aspirin and heparin gtt. contraindicated in the setting of worsening anemia.  Mild to moderate valvulopathy Patient with history of mild aortic stenosis mild mitral stenosis.  Will continue home metoprolol.  Outpatient cardiology follow-up.  Peripheral arterial disease Continue holding Plavix  Hypothyroidism PTA Synthroid  Essential hypertension Resume home clonidine Lasix as above PTA metoprolol Hold losartan  AKI Creatinine normal at baseline.  Elevated to 1.47 on 11/4. Suspect prerenal azotemia versus ATN Daily BMP Avoid nephrotoxins  Goals of care discussion Lengthy discussion with patient and daughter at bedside on 11/8.  Explained differences between full CODE STATUS and DNR.  Patient states "I have had a good life".  She does not wish to be resuscitated nor intubated.  We will change CODE STATUS to DNR.  Patient has decision-making capability.  Daughter in agreement.  Separate IPAL placed.    DVT prophylaxis: SCD Code Status: Full Family Communication: Daughter at bedside 11/4, 11/5, 11/6, 11/7, 11/8 Disposition Plan: Status is: Inpatient Remains inpatient appropriate because: Anemia, hypoxic respiratory failure, will attempt to wean from oxygen     Level of care: Progressive  Consultants:  GI Cardiology Hematology  Procedures:  None  Antimicrobials: None   Subjective: Seen and examined. Sitting in bed eating breakfast.  Daughter at bedside.  Objective: Vitals:   08/09/22 0516 08/09/22 0803 08/09/22 0958 08/09/22 1116  BP: (!) 127/53 (!) 128/43 (!) 110/37 (!) 104/35  Pulse: 70 70 69 82  Resp: _0 Temp: 98 F (36.7 C) 97.9 F (36.6 C)  (!) 97.5 F (36.4 C)  TempSrc:  Oral  Oral  SpO2: 95% 94% 96% 92%  Weight:      Height:        Intake/Output Summary (Last 24 hours) at 08/09/2022 1120 Last data filed at 08/09/2022 0900 Gross per 24 hour  Intake 240 ml  Output 600 ml  Net -360  ml   Filed Weights   08/08/22 0500 08/08/22 0718 08/09/22 0500  Weight: 47 kg 47 kg 45.9 kg    Examination:  General exam: No acute distress Respiratory system: Left base crackles.  No work of breathing.  2 L Cardiovascular system: S1-S2 RRR, 2/6 murmur, no pitting edema Gastrointestinal system: Soft, NT/ND, normal bowel sounds Central nervous system: Alert and oriented. No focal neurological deficits. Extremities: Symmetric 5 x 5 power. Skin: No rashes, lesions or ulcers Psychiatry: Judgement and insight appear normal. Mood & affect appropriate.     Data Reviewed: I have personally reviewed following labs and imaging studies  CBC: Recent Labs  Lab 08/03/22 1439 08/04/22 0535 08/05/22 0440 08/05/22 2132 08/06/22 0813 08/07/22 0957 08/09/22 0755  WBC 4.9  4.8 6.2 6.3  --  4.4 4.4 4.1  NEUTROABS 3.4  --  4.7  --  3.5 3.4 3.2  HGB 7.9*  7.9* 7.7* 7.4* 8.6* 8.8* 8.9* 7.8*  HCT 23.8*  23.8* 22.6* 22.1* 24.7* 25.7* 26.3* 23.3*  MCV 99.2  99.6 97.4 97.8  --  94.8 95.6 96.7  PLT 128*  130* 125* 126*  --  115* 121* 90*   Basic Metabolic Panel: Recent Labs  Lab 08/04/22 0535 08/05/22  0454 08/06/22 0812 08/07/22 0957 08/09/22 0755  NA 135 137 138 139 136  K 3.9 3.8 3.8 3.9 3.5  CL 104 106 106 106 103  CO2 21* _0 GLUCOSE 104* 93 96 102* 95  BUN _1 24*  CREATININE 1.33* 1.47* 1.48* 1.52* 1.57*  CALCIUM 8.6* 9.2 9.3 9.4 9.0  MG  --  2.3 2.0  --  2.2   GFR: Estimated Creatinine Clearance: 15.9 mL/min (A) (by C-G formula based on SCr of 1.57 mg/dL (H)). Liver Function Tests: Recent Labs  Lab 08/02/22 1403 08/03/22 1439  AST  --  29  ALT  --  12  ALKPHOS  --  93  BILITOT  --  0.5  PROT 5.5* 6.4*  ALBUMIN  --  3.4*   Recent Labs  Lab 08/03/22 1439  LIPASE 40   No results for input(s): "AMMONIA" in the last 168 hours. Coagulation Profile: No results for input(s): "INR", "PROTIME" in the last 168 hours. Cardiac Enzymes: No results  for input(s): "CKTOTAL", "CKMB", "CKMBINDEX", "TROPONINI" in the last 168 hours. BNP (last 3 results) No results for input(s): "PROBNP" in the last 8760 hours. HbA1C: No results for input(s): "HGBA1C" in the last 72 hours. CBG: No results for input(s): "GLUCAP" in the last 168 hours. Lipid Profile: No results for input(s): "CHOL", "HDL", "LDLCALC", "TRIG", "CHOLHDL", "LDLDIRECT" in the last 72 hours. Thyroid Function Tests: No results for input(s): "TSH", "T4TOTAL", "FREET4", "T3FREE", "THYROIDAB" in the last 72 hours. Anemia Panel: No results for input(s): "VITAMINB12", "FOLATE", "FERRITIN", "TIBC", "IRON", "RETICCTPCT" in the last 72 hours.  Sepsis Labs: Recent Labs  Lab 08/07/22 2309  PROCALCITON <0.10    Recent Results (from the past 240 hour(s))  SARS Coronavirus 2 by RT PCR (hospital order, performed in [facility] hospital lab) *cepheid single result test* Anterior Nasal Swab     Status: None   Collection Time: 08/03/22  5:07 PM   Specimen: Anterior Nasal Swab  Result Value Ref Range Status   SARS Coronavirus 2 by RT PCR NEGATIVE NEGATIVE Final    Comment: (NOTE) SARS-CoV-2 target nucleic acids are NOT DETECTED.  The SARS-CoV-2 RNA is generally detectable in upper and lower respiratory specimens during the acute phase of infection. The lowest concentration of SARS-CoV-2 viral copies this assay can detect is 250 copies / mL. A negative result does not preclude SARS-CoV-2 infection and should not be used as the sole basis for treatment or other patient management decisions.  A negative result may occur with improper specimen collection / handling, submission of specimen other than nasopharyngeal swab, presence of viral mutation(s) within the areas targeted by this assay, and inadequate number of viral copies (<250 copies / mL). A negative result must be combined with clinical observations, patient history, and epidemiological information.  Fact Sheet for Patients:    https://www.patel.info/  Fact Sheet for Healthcare Providers: https://hall.com/  This test is not yet approved or  cleared by the [place] FDA and has been authorized for detection and/or diagnosis of SARS-CoV-2 by FDA under an Emergency Use Authorization (EUA).  This EUA will remain in effect (meaning this test can be used) for the duration of the COVID-19 declaration under Section 564(b)(1) of the Act, 21 U.S.C. section 360bbb-3(b)(1), unless the authorization is terminated or revoked sooner.  Performed at [facility], [address], [place], [place] [address]          Radiology Studies: [facility] [place] [address]  Result Date: 08/08/2022 CLINICAL DATA:  Hypoxia EXAM: PORTABLE CHEST 1 VIEW COMPARISON:  08/07/2022 FINDINGS: Cardiomegaly. Aortic atherosclerosis. Widespread pulmonary density shows some worsening, possibly due to a combination of pleural fluid, lower lobe volume loss, edema and pneumonia. IMPRESSION: Worsening of widespread pulmonary density, possibly due to a combination of pleural fluid, lower lobe volume loss, edema and pneumonia. Electronically Signed   By: Nelson Chimes M.D.   On: 08/08/2022 10:26        Scheduled Meds:  cloNIDine  0.1 mg Oral BID   dorzolamide  1 drop Both Eyes TID   fluticasone  2 spray Each Nare Daily   levothyroxine  75 mcg Oral Q0600   losartan  100 mg Oral Daily   metoprolol tartrate  12.5 mg Oral BID   oxymetazoline  1 spray Each Nare BID   Continuous Infusions:   LOS: 4 days    Sidney Ace, MD [facility]   If 7PM-7AM, please contact night-coverage  08/09/2022, 11:20 AM

## 2022-08-09 NOTE — IPAL (Signed)
  Interdisciplinary Goals of Care Family Meeting   Date carried out: 08/09/2022  Location of the meeting: Bedside  Member's involved: Physician and Family Member or next of kin  Durable Power of Attorney or Loss adjuster, chartered: Patient  Discussion: We discussed goals of care for [employer] .  Explains the difference between DNR and full CODE STATUS.  Patient states "I have had a good life".  She does not wish for aggressive life-saving measures.  No chest compressions, no endotracheal intubation.  Continue all aggressive medical management up to this point.  Daughter at bedside.  Code status: Full DNR  Disposition: Continue current acute care  Time spent for the meeting: 30 minutes    Sidney Ace, MD  08/09/2022, 11:07 AM

## 2022-08-09 NOTE — Progress Notes (Signed)
Occupational Therapy Treatment Patient Details Name: Deanna White MRN: [id number] DOB: 1925-05-26 Today's Date: 08/09/2022   History of present illness 88 yo female presenting with anemia.PMH HTN, B12 deficiency, right-sided heart failure, mitral valve regurgitation,  HTN, aortic stenosis, hyperlipidemia, anemia, mitral stenosis   OT comments  Patient received in bed, daughter present, and agreeable to OT services. SP02 unplugged upon arrival, SP02 at 55%. Patient asymptomatic. SP02 increased to 4L SP02 at 93% before activity. Patient was able to perform bed mobility supine<>sit with min A. Patient was able to perform UB bathing with set up, min A for thoroughness for LB bathing. Patient placed back in supine with min guard. SP02 at 95% on 2.5L. Nurse notified. Patient left in bed with call bell in reach, bed alarm set, family present, and all needs met. OT is still recommending SNF, but patient could progress to [facility] with frequent supervision in the home.    Recommendations for follow up therapy are one component of a multi-disciplinary discharge planning process, led by the attending physician.  Recommendations may be updated based on patient status, additional functional criteria and insurance authorization.    Follow Up Recommendations  Skilled nursing-short term rehab (<3 hours/day)    Assistance Recommended at Discharge Intermittent Supervision/Assistance  Patient can return home with the following  A little help with walking and/or transfers;Help with stairs or ramp for entrance;A little help with bathing/dressing/bathroom;Assistance with cooking/housework   Equipment Recommendations  Other (comment)       Precautions / Restrictions Precautions Precautions: Fall;Other (comment) Precaution Comments: monitor SP02 Restrictions Weight Bearing Restrictions: No       Mobility Bed Mobility Overal bed mobility: Needs Assistance       Supine to sit: Min assist Sit to supine: Min  guard        Transfers Overall transfer level: Needs assistance Equipment used: Rolling walker (2 wheels) Transfers: Sit to/from Stand Sit to Stand: Min guard                 Balance Overall balance assessment: Needs assistance Sitting-balance support: Feet supported, No upper extremity supported Sitting balance-Leahy Scale: Good     Standing balance support: Bilateral upper extremity supported Standing balance-Leahy Scale: Fair                             ADL either performed or assessed with clinical judgement   ADL Overall ADL's : Needs assistance/impaired     Grooming: Wash/dry face;Set up   Upper Body Bathing: Set up   Lower Body Bathing: Minimal assistance;Bed level;Sit to/from stand Lower Body Bathing Details (indicate cue type and reason): for thoroughness Upper Body Dressing : Set up;Sitting;Bed level   Lower Body Dressing: Set up               Functional mobility during ADLs: Supervision/safety;Min guard;Rolling walker (2 wheels)      Extremity/Trunk Assessment Upper Extremity Assessment Upper Extremity Assessment: Overall WFL for tasks assessed   Lower Extremity Assessment Lower Extremity Assessment: Generalized weakness        Vision Patient Visual Report: No change from baseline            Cognition Arousal/Alertness: Awake/alert Behavior During Therapy: WFL for tasks assessed/performed Overall Cognitive Status: Within Functional Limits for tasks assessed  General Comments: hard of hearing                   Pertinent Vitals/ Pain       Pain Assessment Pain Assessment: No/denies pain   Frequency  Min 2X/week        Progress Toward Goals  OT Goals(current goals can now be found in the care plan section)     Acute Rehab OT Goals Patient Stated Goal: to return home. OT Goal Formulation: With patient/family Time For Goal Achievement: 08/21/22 Potential  to Achieve Goals: Good         AM-PAC OT "6 Clicks" Daily Activity     Outcome Measure   Help from another person eating meals?: None Help from another person taking care of personal grooming?: None Help from another person toileting, which includes using toliet, bedpan, or urinal?: A Little Help from another person bathing (including washing, rinsing, drying)?: A Little Help from another person to put on and taking off regular upper body clothing?: None Help from another person to put on and taking off regular lower body clothing?: A Little 6 Click Score: 21    End of Session Equipment Utilized During Treatment: Rolling walker (2 wheels);Oxygen  OT Visit Diagnosis: Muscle weakness (generalized) (M62.81)   Activity Tolerance Patient tolerated treatment well   Patient Left in bed;with call bell/phone within reach;with bed alarm set;with family/visitor present   Nurse Communication Mobility status;Other (comment) (SP02 unplugged upon arrival, SP02 at 55% on RA)        Time: 1401-1436 OT Time Calculation (min): 35 min  Charges: OT General Charges $OT Visit: 1 Visit OT Treatments $Self Care/Home Management : 23-37 mins    Jelitza Manninen, OTS 08/09/2022, 3:35 PM

## 2022-08-09 NOTE — Plan of Care (Signed)
  Problem: Education: Goal: Ability to demonstrate management of disease process will improve Outcome: Progressing Goal: Ability to verbalize understanding of medication therapies will improve Outcome: Progressing Goal: Individualized Educational Video(s) Outcome: Progressing   Problem: Activity: Goal: Capacity to carry out activities will improve Outcome: Progressing   

## 2022-08-10 DIAGNOSIS — D649 Anemia, unspecified: Secondary | ICD-10-CM | POA: Diagnosis not present

## 2022-08-10 LAB — MULTIPLE MYELOMA PANEL, SERUM
Albumin SerPl Elph-Mcnc: 3 g/dL (ref 2.9–4.4)
Albumin/Glob SerPl: 1.5 (ref 0.7–1.7)
Alpha 1: 0.3 g/dL (ref 0.0–0.4)
Alpha2 Glob SerPl Elph-Mcnc: 0.7 g/dL (ref 0.4–1.0)
B-Globulin SerPl Elph-Mcnc: 0.8 g/dL (ref 0.7–1.3)
Gamma Glob SerPl Elph-Mcnc: 0.4 g/dL (ref 0.4–1.8)
Globulin, Total: 2.1 g/dL — ABNORMAL LOW (ref 2.2–3.9)
IgA: 10 mg/dL — ABNORMAL LOW (ref 64–422)
IgG (Immunoglobin G), Serum: 392 mg/dL — ABNORMAL LOW (ref 586–1602)
IgM (Immunoglobulin M), Srm: 38 mg/dL (ref 26–217)
M Protein SerPl Elph-Mcnc: 0.4 g/dL — ABNORMAL HIGH
Total Protein ELP: 5.1 g/dL — ABNORMAL LOW (ref 6.0–8.5)

## 2022-08-10 LAB — CBC WITH DIFFERENTIAL/PLATELET
Abs Immature Granulocytes: 0.01 10*3/uL (ref 0.00–0.07)
Basophils Absolute: 0 10*3/uL (ref 0.0–0.1)
Basophils Relative: 0 %
Eosinophils Absolute: 0.1 10*3/uL (ref 0.0–0.5)
Eosinophils Relative: 1 %
HCT: 23.5 % — ABNORMAL LOW (ref 36.0–46.0)
Hemoglobin: 7.7 g/dL — ABNORMAL LOW (ref 12.0–15.0)
Immature Granulocytes: 0 %
Lymphocytes Relative: 14 %
Lymphs Abs: 0.6 10*3/uL — ABNORMAL LOW (ref 0.7–4.0)
MCH: 32 pg (ref 26.0–34.0)
MCHC: 32.8 g/dL (ref 30.0–36.0)
MCV: 97.5 fL (ref 80.0–100.0)
Monocytes Absolute: 0.1 10*3/uL (ref 0.1–1.0)
Monocytes Relative: 2 %
Neutro Abs: 3.5 10*3/uL (ref 1.7–7.7)
Neutrophils Relative %: 83 %
Platelets: 99 10*3/uL — ABNORMAL LOW (ref 150–400)
RBC: 2.41 MIL/uL — ABNORMAL LOW (ref 3.87–5.11)
RDW: 15.7 % — ABNORMAL HIGH (ref 11.5–15.5)
Smear Review: NORMAL
WBC: 4.3 10*3/uL (ref 4.0–10.5)
nRBC: 0 % (ref 0.0–0.2)

## 2022-08-10 LAB — BASIC METABOLIC PANEL
Anion gap: 10 (ref 5–15)
BUN: 21 mg/dL (ref 8–23)
CO2: 24 mmol/L (ref 22–32)
Calcium: 9.1 mg/dL (ref 8.9–10.3)
Chloride: 102 mmol/L (ref 98–111)
Creatinine, Ser: 1.58 mg/dL — ABNORMAL HIGH (ref 0.44–1.00)
GFR, Estimated: 30 mL/min — ABNORMAL LOW (ref >60)
Glucose, Bld: 94 mg/dL (ref 70–99)
Potassium: 3.9 mmol/L (ref 3.5–5.1)
Sodium: 136 mmol/L (ref 135–145)

## 2022-08-10 LAB — PREPARE RBC (CROSSMATCH)

## 2022-08-10 MED ORDER — FUROSEMIDE 10 MG/ML IJ SOLN
20.0000 mg | Freq: Once | INTRAMUSCULAR | Status: AC
  Administered 2022-08-10: 20 mg via INTRAVENOUS
  Filled 2022-08-10: qty 2

## 2022-08-10 MED ORDER — BOOST / RESOURCE BREEZE PO LIQD CUSTOM
1.0000 | Freq: Three times a day (TID) | ORAL | Status: DC
  Administered 2022-08-10 – 2022-08-11 (×3): 1 via ORAL

## 2022-08-10 MED ORDER — SODIUM CHLORIDE 0.9% IV SOLUTION: Freq: Once | INTRAVENOUS | Status: DC

## 2022-08-10 NOTE — Progress Notes (Addendum)
SATURATION QUALIFICATIONS: (This note is used to comply with regulatory documentation for home oxygen)  Patient Saturations on Room Air at Rest = 86%  Patient Saturation on 2 Liters with mobility = 85%  Patient Saturations on 3 Liters of oxygen while Ambulating = 90%  Please briefly explain why patient needs home oxygen:

## 2022-08-10 NOTE — Progress Notes (Signed)
PROGRESS NOTE    Deanna White  PXT:[id number] DOB: 04-06-1925 DOA: 08/03/2022 PCP: Crecencio Mc, MD    Brief Narrative:  88 year old female who was sent to the emergency room by her primary care physician due to worsening anemia of unknown etiology.  Patient had a drop in hemoglobin from 12 to mid sevens.  On presentation there was also concern for mild exacerbation of patient's preserved ejection fraction heart failure.  She is placed on gentle IV diuresis per cardiology.  No plans for ischemic evaluation or echocardiogram.   Patient has hypoxia as well, currently on 3-4L nasal cannula.  Likely due to fluid overload/decompensated HF   Patient also has symptomatic anemia.  Guaiac positive stool in the ED.  Folate, B12 within normal limits.  Iron indices seem to indicate anemia of inflammation rather than true iron deficiency but patient may benefit from IV iron.  Will await GI recommendations and evaluation.  Patient may warrant endoscopy during this admission.   Patient is on Plavix.  I believe this is part of her treatment for peripheral arterial disease.  1/4: Case discussed with hematology and GI.  GI endoscopic evaluation on hold for now.  Iron indices, B12, folate seem to indicate a diagnosis of anemia of chronic disease.  However diagnosis is somewhat unclear.  Iron indices do not indicate acute nor chronic blood loss.  Tentative plan for bone marrow biopsy Monday 11/6.  Kidney function slightly worse.  Patient remains on IV diuretics per cardiology.  She remains on supplemental oxygen though improved from prior.  Currently on 2 L nasal cannula.  Hemoglobin decreased to 7.4 from 7.9 on admission.  Considering chronic comorbidities we will transfuse to goal hemoglobin of 8.  11/5: Received 1 unit PRBC with appropriate rise in hemoglobin. 11/7: Bone marrow bx cancelled due to hypoxia 11/8: Remains hypoxic, but slowly improving 11/9: Remains hypoxic.  Hemoglobin drifting  down   Assessment & Plan:   Principal Problem:   Symptomatic anemia Active Problems:   Acute on chronic diastolic CHF (congestive heart failure) (HCC)   Iron deficiency anemia due to chronic blood loss   PAD (peripheral artery disease) (HCC)   Benign essential hypertension   Hypothyroidism   Aortic stenosis   Anemia of chronic disease   Acute decompensated heart failure (HCC)  Acute symptomatic anemia Guaiac positive stools Unclear etiology.  Iron indices seem to indicate anemia of chronic disease.  Patient not overtly iron deficient.  B12 and folate also within normal limits.  No evidence of active bleed other than questionable intermittent dark stools.  However she is on Plavix and has been so on several years. Transfused 1 unit PRBC on 11/4 Plan: At this time no plans for inpatient endoscopic evaluation.   Hematology was consulted Initial plan for bone marrow biopsy put on hold due to hypoxia Continue holding Plavix Transfuse 1 unit PRBC for hemoglobin 7.7 on 11/9  Acute decompensated diastolic congestive heart failure Acute hypoxic respiratory failure secondary to above Patient did have evidence of fluid overload though no lower extremity edema noted.  Elevated BNP and interstitial edema seen on x-ray.  Likely that hypoxic respiratory failure was driven by fluid overload/decompensated heart failure.  The patient has no baseline requirement.  She had a pulse oximetry of 86% on room air on arrival.  Has required up to 4 L nasal cannula since admission.  Currently on 2 L.  Likely that symptomatic anemia drove mild exacerbation of heart failure.  Cardiology discontinued Lasix on  11/4.  Last dose on 11/3. Received another dose of Lasix on 11/7 Plan: Lasix 20 mg IV x1 prior to blood transfusion Continue supplemental oxygen, wean as tolerated Stress incentive spirometry use No indication for repeat echo Therapy evaluations, ambulate as tolerated Plan for discharge home with home  health, full scope home and services ordered Home DME oxygen has been ordered  Elevated high-sensitivity troponin Unlikely to represent ACS.  No chest pain.  No significant delta.  Suspect supply demand ischemia in the setting of symptomatic anemia.  Aspirin and heparin gtt. contraindicated in the setting of worsening anemia.  Mild to moderate valvulopathy Patient with history of mild aortic stenosis mild mitral stenosis.  Will continue home metoprolol.  Outpatient cardiology follow-up.  Peripheral arterial disease Continue holding Plavix  Hypothyroidism PTA Synthroid  Essential hypertension Resume home clonidine Lasix as above PTA metoprolol Hold losartan  AKI Creatinine normal at baseline.  Elevated to 1.47 on 11/4. Suspect prerenal azotemia versus ATN Daily BMP Avoid nephrotoxins  Goals of care discussion Lengthy discussion with patient and daughter at bedside on 11/8.  Explained differences between full CODE STATUS and DNR.  Patient states "I have had a good life".  She does not wish to be resuscitated nor intubated.  We will change CODE STATUS to DNR.  Patient has decision-making capability.  Daughter in agreement.  Separate IPAL placed.    DVT prophylaxis: SCD Code Status: Full Family Communication: Daughter at bedside 11/4, 11/5, 11/6, 11/7, 11/8.  Son at bedside 11/9 Disposition Plan: Status is: Inpatient Remains inpatient appropriate because: Anemia, hypoxic respiratory failure, will attempt to wean from oxygen.  Transfusing 1 unit PRBC.  Check a.m. CBC.  If stable plan on discharge home with home health services 11/10.     Level of care: Progressive  Consultants:  GI Cardiology Hematology  Procedures:  None  Antimicrobials: None   Subjective: Seen and examined.  Sitting up in bed.  Appears somewhat deconditioned this morning.  Objective: Vitals:   08/10/22 0450 08/10/22 0500 08/10/22 0821 08/10/22 1437  BP: (!) 109/43  (!) 123/45 (!) 132/51   Pulse: 71  74 70  Resp: _0 Temp: 98 F (36.7 C)  97.8 F (36.6 C) 98 F (36.7 C)  TempSrc: Oral   Oral  SpO2: 98%  96% 97%  Weight:  46 kg    Height:       No intake or output data in the 24 hours ending 08/10/22 1454  Filed Weights   08/08/22 0718 08/09/22 0500 08/10/22 0500  Weight: 47 kg 45.9 kg 46 kg    Examination:  General exam: NAD appears frail Respiratory system: Bibasilar crackles.  Normal work of breathing.  3 L Cardiovascular system: S1-S2 RRR, 2/6 murmur, no pitting edema Gastrointestinal system: Soft, NT/ND, normal bowel sounds Central nervous system: Alert and oriented. No focal neurological deficits. Extremities: Symmetric 5 x 5 power. Skin: No rashes, lesions or ulcers Psychiatry: Judgement and insight appear normal. Mood & affect appropriate.     Data Reviewed: I have personally reviewed following labs and imaging studies  CBC: Recent Labs  Lab 08/05/22 0440 08/05/22 2132 08/06/22 0813 08/07/22 0957 08/09/22 0755 08/10/22 0758  WBC 6.3  --  4.4 4.4 4.1 4.3  NEUTROABS 4.7  --  3.5 3.4 3.2 3.5  HGB 7.4* 8.6* 8.8* 8.9* 7.8* 7.7*  HCT 22.1* 24.7* 25.7* 26.3* 23.3* 23.5*  MCV 97.8  --  94.8 95.6 96.7 97.5  PLT 126*  --  115* 121* 90* 99*   Basic Metabolic Panel: Recent Labs  Lab 08/05/22 0440 08/06/22 0812 08/07/22 0957 08/09/22 0755 08/10/22 0758  NA 137 138 139 136 136  K 3.8 3.8 3.9 3.5 3.9  CL 106 106 106 103 102  CO2 _0 GLUCOSE 93 96 102* 95 94  BUN _1 24* 21  CREATININE 1.47* 1.48* 1.52* 1.57* 1.58*  CALCIUM 9.2 9.3 9.4 9.0 9.1  MG 2.3 2.0  --  2.2  --    GFR: Estimated Creatinine Clearance: 15.8 mL/min (A) (by C-G formula based on SCr of 1.58 mg/dL (H)). Liver Function Tests: No results for input(s): "AST", "ALT", "ALKPHOS", "BILITOT", "PROT", "ALBUMIN" in the last 168 hours.  No results for input(s): "LIPASE", "AMYLASE" in the last 168 hours.  No results for input(s): "AMMONIA" in the last 168  hours. Coagulation Profile: No results for input(s): "INR", "PROTIME" in the last 168 hours. Cardiac Enzymes: No results for input(s): "CKTOTAL", "CKMB", "CKMBINDEX", "TROPONINI" in the last 168 hours. BNP (last 3 results) No results for input(s): "PROBNP" in the last 8760 hours. HbA1C: No results for input(s): "HGBA1C" in the last 72 hours. CBG: No results for input(s): "GLUCAP" in the last 168 hours. Lipid Profile: No results for input(s): "CHOL", "HDL", "LDLCALC", "TRIG", "CHOLHDL", "LDLDIRECT" in the last 72 hours. Thyroid Function Tests: No results for input(s): "TSH", "T4TOTAL", "FREET4", "T3FREE", "THYROIDAB" in the last 72 hours. Anemia Panel: No results for input(s): "VITAMINB12", "FOLATE", "FERRITIN", "TIBC", "IRON", "RETICCTPCT" in the last 72 hours.  Sepsis Labs: Recent Labs  Lab 08/07/22 2309  PROCALCITON <0.10    Recent Results (from the past 240 hour(s))  SARS Coronavirus 2 by RT PCR (hospital order, performed in [facility] hospital lab) *cepheid single result test* Anterior Nasal Swab     Status: None   Collection Time: 08/03/22  5:07 PM   Specimen: Anterior Nasal Swab  Result Value Ref Range Status   SARS Coronavirus 2 by RT PCR NEGATIVE NEGATIVE Final    Comment: (NOTE) SARS-CoV-2 target nucleic acids are NOT DETECTED.  The SARS-CoV-2 RNA is generally detectable in upper and lower respiratory specimens during the acute phase of infection. The lowest concentration of SARS-CoV-2 viral copies this assay can detect is 250 copies / mL. A negative result does not preclude SARS-CoV-2 infection and should not be used as the sole basis for treatment or other patient management decisions.  A negative result may occur with improper specimen collection / handling, submission of specimen other than nasopharyngeal swab, presence of viral mutation(s) within the areas targeted by this assay, and inadequate number of viral copies (<250 copies / mL). A negative result must  be combined with clinical observations, patient history, and epidemiological information.  Fact Sheet for Patients:   https://www.patel.info/  Fact Sheet for Healthcare Providers: https://hall.com/  This test is not yet approved or  cleared by the [place] FDA and has been authorized for detection and/or diagnosis of SARS-CoV-2 by FDA under an Emergency Use Authorization (EUA).  This EUA will remain in effect (meaning this test can be used) for the duration of the COVID-19 declaration under Section 564(b)(1) of the Act, 21 U.S.C. section 360bbb-3(b)(1), unless the authorization is terminated or revoked sooner.  Performed at [facility], [address], [place], [place] [address]          Radiology Studies: No results found.      Scheduled Meds:  sodium chloride   Intravenous Once  cloNIDine  0.1 mg Oral BID   dorzolamide  1 drop Both Eyes TID   feeding supplement  1 Container Oral TID BM   fluticasone  2 spray Each Nare Daily   levothyroxine  75 mcg Oral Q0600   losartan  100 mg Oral Daily   metoprolol tartrate  12.5 mg Oral BID   Continuous Infusions:   LOS: 5 days    Sidney Ace, MD [facility]   If 7PM-7AM, please contact night-coverage  08/10/2022, 2:54 PM

## 2022-08-10 NOTE — TOC Progression Note (Addendum)
Transition of Care [facility]) - Progression Note    Patient Details  Name: Deanna White MRN: [id number] Date of Birth: 03-14-1925  Transition of Care [facility]) CM/SW [place], LCSW Phone Number: 08/10/2022, 9:58 AM  Clinical Narrative:   Received call from son. Preferred home health agency is [place]. Liaison is aware and can accept for PT, OT, RN, aide.  3:02 pm: Ordered home oxygen through [employer]. Per MD, likely discharge tomorrow.  Expected Discharge Plan: [place] Barriers to Discharge: Continued Medical Work up  Expected Discharge Plan and Services Expected Discharge Plan: [place] Choice: [place] arrangements for the past 2 months: Single Family Home                                       Social Determinants of Health (SDOH) Interventions    Readmission Risk Interventions     No data to display

## 2022-08-10 NOTE — Care Management Important Message (Signed)
Important Message  Patient Details  Name: Deanna White MRN: [id number] Date of Birth: 01/30/1925   Medicare Important Message Given:  Yes     [name] 08/10/2022, 2:29 PM

## 2022-08-10 NOTE — Progress Notes (Signed)
Physical Therapy Treatment Patient Details Name: Deanna White MRN: [id number] DOB: 05/25/1925 Today's Date: 08/10/2022   History of Present Illness 88 yo female presenting with anemia.PMH HTN, B12 deficiency, right-sided heart failure, mitral valve regurgitation,  HTN, aortic stenosis, hyperlipidemia, anemia, mitral stenosis    PT Comments    Patient with improved activity tolerance today. Did trial on room air at RN request, desaturated to mid 80s on 0-2L, 3L utilized for mobility this session. She was able to ambulate ~153f with RW and CGA, instructed in standing rest breaks to allow for respiratory status assessment/activity pacing. She was also able to perform bathroom activities with CGA. Pt up in chair with needs in reach at end of session. The patient would benefit from further skilled PT intervention to continue to progress towards goals. Recommendation remains appropriate.    Recommendations for follow up therapy are one component of a multi-disciplinary discharge planning process, led by the attending physician.  Recommendations may be updated based on patient status, additional functional criteria and insurance authorization.  Follow Up Recommendations  Home health PT     Assistance Recommended at Discharge Intermittent Supervision/Assistance  Patient can return home with the following Assistance with cooking/housework;Assist for transportation;Help with stairs or ramp for entrance;A little help with bathing/dressing/bathroom;A little help with walking and/or transfers;Direct supervision/assist for medications management   Equipment Recommendations  Rolling walker (2 wheels)    Recommendations for Other Services       Precautions / Restrictions Precautions Precautions: Fall;Other (comment) Precaution Comments: monitor SP02 Restrictions Weight Bearing Restrictions: No     Mobility  Bed Mobility Overal bed mobility: Needs Assistance Bed Mobility: Supine to Sit      Supine to sit: Min guard     General bed mobility comments: extra time, use of bed rails    Transfers Overall transfer level: Needs assistance Equipment used: Rolling walker (2 wheels) Transfers: Sit to/from Stand Sit to Stand: Min guard                Ambulation/Gait Ambulation/Gait assistance: Min guard Gait Distance (Feet): 170 Feet Assistive device: Rolling walker (2 wheels) Gait Pattern/deviations: Step-through pattern, Decreased step length - right, Decreased step length - left, Decreased stride length, Decreased dorsiflexion - right, Decreased dorsiflexion - left       General Gait Details: improved tolerance and ability to progress distance today, cued for standing rest breaks   Stairs             Wheelchair Mobility    Modified Rankin (Stroke Patients Only)       Balance Overall balance assessment: Needs assistance Sitting-balance support: Feet supported, No upper extremity supported Sitting balance-Leahy Scale: Good     Standing balance support: Bilateral upper extremity supported Standing balance-Leahy Scale: Fair                              Cognition Arousal/Alertness: Awake/alert Behavior During Therapy: WFL for tasks assessed/performed Overall Cognitive Status: Within Functional Limits for tasks assessed                                          Exercises      General Comments        Pertinent Vitals/Pain Pain Assessment Pain Assessment: No/denies pain    Home Living  Prior Function            PT Goals (current goals can now be found in the care plan section) Progress towards PT goals: Progressing toward goals    Frequency    Min 2X/week      PT Plan Current plan remains appropriate    Co-evaluation              AM-PAC PT "6 Clicks" Mobility   Outcome Measure  Help needed turning from your back to your side while in a flat bed without  using bedrails?: A Little Help needed moving from lying on your back to sitting on the side of a flat bed without using bedrails?: A Little Help needed moving to and from a bed to a chair (including a wheelchair)?: A Little Help needed standing up from a chair using your arms (e.g., wheelchair or bedside chair)?: A Little Help needed to walk in hospital room?: A Little Help needed climbing 3-5 steps with a railing? : A Little 6 Click Score: 18    End of Session Equipment Utilized During Treatment: Gait belt;Oxygen (0-3L) Activity Tolerance: Patient tolerated treatment well Patient left: in bed;with bed alarm set;with family/visitor present;with call bell/phone within reach Nurse Communication: Mobility status PT Visit Diagnosis: Unsteadiness on feet (R26.81);Muscle weakness (generalized) (M62.81)     Time: 1130-1200 PT Time Calculation (min) (ACUTE ONLY): 30 min  Charges:  $Therapeutic Activity: 23-37 mins                     Lieutenant Diego PT, DPT 1:25 PM,08/10/22

## 2022-08-10 NOTE — Progress Notes (Addendum)
1715:  Peripheral IV blew while starting Blood 1st time.   Charge nurse able to start another peripheral IV immediately.     1850:  2nd peripheral IV blew.   Stopped blood. Placed Stat order for IV team to place 3rd IV   1930: Restarted blood, rate at 150 ml/hour.  Will continue to monitor  Let Night RN know

## 2022-08-11 ENCOUNTER — Ambulatory Visit: Payer: Medicare PPO | Admitting: Family

## 2022-08-11 DIAGNOSIS — I5033 Acute on chronic diastolic (congestive) heart failure: Secondary | ICD-10-CM | POA: Diagnosis not present

## 2022-08-11 DIAGNOSIS — J9601 Acute respiratory failure with hypoxia: Secondary | ICD-10-CM | POA: Diagnosis not present

## 2022-08-11 DIAGNOSIS — D500 Iron deficiency anemia secondary to blood loss (chronic): Secondary | ICD-10-CM

## 2022-08-11 DIAGNOSIS — D649 Anemia, unspecified: Secondary | ICD-10-CM | POA: Diagnosis not present

## 2022-08-11 LAB — CBC
HCT: 31.5 % — ABNORMAL LOW (ref 36.0–46.0)
Hemoglobin: 10.9 g/dL — ABNORMAL LOW (ref 12.0–15.0)
MCH: 32.4 pg (ref 26.0–34.0)
MCHC: 34.6 g/dL (ref 30.0–36.0)
MCV: 93.8 fL (ref 80.0–100.0)
Platelets: 115 10*3/uL — ABNORMAL LOW (ref 150–400)
RBC: 3.36 MIL/uL — ABNORMAL LOW (ref 3.87–5.11)
RDW: 15.7 % — ABNORMAL HIGH (ref 11.5–15.5)
WBC: 5 10*3/uL (ref 4.0–10.5)
nRBC: 0 % (ref 0.0–0.2)

## 2022-08-11 LAB — TYPE AND SCREEN
ABO/RH(D): A POS
Antibody Screen: NEGATIVE
Unit division: 0

## 2022-08-11 LAB — BPAM RBC
Blood Product Expiration Date: 202312062359
ISSUE DATE / TIME: 202311091657
Unit Type and Rh: 6200

## 2022-08-11 MED ORDER — CLONIDINE HCL 0.1 MG PO TABS: ORAL_TABLET | ORAL | 2 refills | Status: AC

## 2022-08-11 MED ORDER — METOPROLOL TARTRATE 25 MG PO TABS: 12.5000 mg | ORAL_TABLET | Freq: Two times a day (BID) | ORAL | 1 refills | Status: DC

## 2022-08-11 MED ORDER — FUROSEMIDE 10 MG/ML IJ SOLN
20.0000 mg | Freq: Once | INTRAMUSCULAR | Status: AC
  Administered 2022-08-11: 20 mg via INTRAVENOUS
  Filled 2022-08-11: qty 2

## 2022-08-11 NOTE — Discharge Summary (Signed)
Physician Discharge Summary   Patient: Deanna White MRN: [id number] DOB: Feb 06, 1925  Admit date:     08/03/2022  Discharge date: 08/11/22  Discharge Physician: Annita Brod   PCP: Crecencio Mc, MD   Recommendations at discharge:   Medication change: Plavix on hold for now. Patient will follow-up with heme-onc as outpatient in the next 2 to 4 weeks Patient being discharged on home oxygen 2 L nasal cannula Medication clarification: Patient will continue on clonidine 0.1 mg p.o. twice daily as she had been taking it (initially prescribed as 1 tablet in morning and 2 tablets in the evening) New medication: Metoprolol 12.5 mg p.o. twice daily Patient being discharged with home health PT, OT, RN and aide  Discharge Diagnoses: Principal Problem:   Symptomatic anemia Active Problems:   Acute on chronic diastolic CHF (congestive heart failure) (HCC)   Iron deficiency anemia due to chronic blood loss   PAD (peripheral artery disease) (HCC)   Benign essential hypertension   Hypothyroidism   Aortic stenosis   Anemia of chronic disease   Acute decompensated heart failure ([place])  Resolved Problems:   * No resolved hospital problems. *  Hospital Course: 88 year old female with past medical history of chronic diastolic heart failure, PAD and aortic stenosis who was sent over to the emergency room by her primary care physician on 11/2 for worsening anemia (hemoglobin at 7, down from 12) as well as acute diastolic heart failure.  Patient noted to be hypoxic at time of admission.  Assessment and Plan: Principal Problem:   Symptomatic anemia Active Problems:   Acute on chronic diastolic CHF (congestive heart failure) (HCC)   Iron deficiency anemia due to chronic blood loss   PAD (peripheral artery disease) (HCC)   Benign essential hypertension   Hypothyroidism   Aortic stenosis   Anemia of chronic disease   Acute decompensated heart failure (HCC)   Acute symptomatic  anemia Guaiac positive stools Unclear etiology.  Iron indices seem to indicate anemia of chronic disease.  Patient not overtly iron deficient.  B12 and folate also within normal limits.  No evidence of active bleed other than questionable intermittent dark stools.  However she is on Plavix and has been so on several years. Transfused 1 unit PRBC on 11/4 and then again on 11/9. At this time no plans for inpatient endoscopic evaluation.   Hematology was consulted.  Bone marrow biopsy put on hold due to hypoxia.  Patient will follow-up with heme-onc as outpatient.  Continue to hold Plavix even after discharge   Acute decompensated diastolic congestive heart failure Acute hypoxic respiratory failure secondary to above Patient did have evidence of fluid overload though no lower extremity edema noted.  Elevated BNP and interstitial edema seen on x-ray.  Likely that hypoxic respiratory failure was driven by fluid overload/decompensated heart failure.  The patient has no baseline requirement.  She had a pulse oximetry of 86% on room air on arrival.  Has required up to 4 L nasal cannula since admission.  Currently on 2 L.  Likely that symptomatic anemia drove mild exacerbation of heart failure.  Cardiology discontinued Lasix on 11/4.  Last dose on 11/3.  Additional doses of Lasix given.  Posttransfusion.  Patient will go home with home health oxygen  Elevated high-sensitivity troponin Unlikely to represent ACS.  No chest pain.  No significant delta.  Suspect supply demand ischemia in the setting of symptomatic anemia.  Aspirin and heparin gtt. contraindicated in the setting of worsening anemia.  Mild to moderate valvulopathy Patient with history of mild aortic stenosis mild mitral stenosis.  Will continue home metoprolol.  Outpatient cardiology follow-up.   Peripheral arterial disease Continue holding Plavix   Hypothyroidism PTA Synthroid   Essential hypertension Resume home clonidine Lasix as  above PTA metoprolol Hold losartan   AKI Creatinine normal at baseline.  Elevated to 1.47 on 11/4. Suspect prerenal azotemia from blood loss.  Prior to transfusion, hemoglobin at 1.58.  Can be checked by PCP and follow-up   Goals of care discussion Lengthy discussion with patient and daughter at bedside on 11/8.  Explained differences between full CODE STATUS and DNR.  Patient states "I have had a good life".  She does not wish to be resuscitated nor intubated.  We will change CODE STATUS to DNR.  Patient has decision-making capability.  Daughter in agreement.  Separate IPAL placed.      Pain control - [employer] Controlled Substance Reporting System database was reviewed. and patient was instructed, not to drive, operate heavy machinery, perform activities at heights, swimming or participation in water activities or provide baby-sitting services while on Pain, Sleep and Anxiety Medications; until their outpatient Physician has advised to do so again. Also recommended to not to take more than prescribed Pain, Sleep and Anxiety Medications.  Consultants:  GI Cardiology Hematology   Procedures:  Transfusion several units packed red blood cells  Disposition: Home with home health Diet recommendation:  Discharge Diet Orders (From admission, onward)     Start     Ordered   08/11/22 0000  Diet general        08/11/22 1411           Regular diet DISCHARGE MEDICATION: Allergies as of 08/11/2022       Reactions   Hctz [hydrochlorothiazide] Other (See Comments)   Hyponatremia   Adhesive [tape] Rash   Peels skin   Brimonidine Rash, Other (See Comments)   Pt felt as if throat was closing up, pt also had rash        Medication List     STOP taking these medications    clopidogrel 75 MG tablet Commonly known as: PLAVIX       TAKE these medications    amLODipine 5 MG tablet Commonly known as: NORVASC Take 0.5 tab by mouth every AM, and take 1 tab  by mouth every  PM   cloNIDine 0.1 MG tablet Commonly known as: CATAPRES 1 tablet in the am and 1 In the evening   cyanocobalamin 1000 MCG/ML injection Commonly known as: VITAMIN B12 Inject 1 mL (1,000 mcg total) into the muscle every 30 (thirty) days.   dorzolamide 2 % ophthalmic solution Commonly known as: TRUSOPT Place 1 drop into both eyes 3 (three) times daily.   fluticasone 50 MCG/ACT nasal spray Commonly known as: FLONASE Place 2 sprays into both nostrils daily.   furosemide 20 MG tablet Commonly known as: LASIX Take 1 tablet (20 mg total) by mouth as needed (As needed for shortness of breath or weight gain.). As needed for swellling   ketoconazole 2 % cream Commonly known as: NIZORAL Apply 1 Application topically at bedtime.   levothyroxine 75 MCG tablet Commonly known as: SYNTHROID TAKE 1 TABLET BY MOUTH EVERY DAY   losartan 100 MG tablet Commonly known as: COZAAR TAKE 1 TABLET BY MOUTH EVERY DAY   metoprolol tartrate 25 MG tablet Commonly known as: LOPRESSOR Take 0.5 tablets (12.5 mg total) by mouth 2 (two) times daily.  NEEDLE (DISP) 25 G 25G X 1" Misc Use to give b12 injection once monthly.   PreserVision AREDS 2 Caps Take 1 capsule by mouth 2 (two) times daily.               Durable Medical Equipment  (From admission, onward)           Start     Ordered   08/11/22 1420  For home use only DME Other see comment  Once       Comments: POC Eval  Question:  Length of Need  Answer:  Lifetime   08/11/22 1420   08/10/22 1537  For home use only DME oxygen  Once       Question Answer Comment  Length of Need 12 Months   Mode or (Route) Nasal cannula   Liters per Minute 3   Frequency Continuous (stationary and portable oxygen unit needed)   Oxygen conserving device Yes   Oxygen delivery system Gas      08/10/22 1536            Follow-up Information     [facility] Follow [facility].   Specialty: Home Health Services Why: They will follow up  with you for your home health needs: Physical therapy, occupational therapy, nurse, and aide. Contact information: [address] [place] [address] [phone]         Sindy Guadeloupe, MD Follow up.   Specialty: Oncology Why: Follow-up with them in the next month if you have not heard from them to set up appointment Contact information: [place] [address] [phone]                Discharge Exam: Danley Danker Weights   08/09/22 0500 08/10/22 0500 08/11/22 0500  Weight: 45.9 kg 46 kg 47 kg   General: Alert and oriented x2, no acute distress Cardiovascular: Irregular rhythm, rate controlled Lungs: Clear to auscultation bilaterally  Condition at discharge: improving  The results of significant diagnostics from this hospitalization (including imaging, microbiology, ancillary and laboratory) are listed below for reference.   Imaging Studies: DG Chest Port 1 View  Result Date: 08/08/2022 CLINICAL DATA:  Hypoxia EXAM: PORTABLE CHEST 1 VIEW COMPARISON:  08/07/2022 FINDINGS: Cardiomegaly. Aortic atherosclerosis. Widespread pulmonary density shows some worsening, possibly due to a combination of pleural fluid, lower lobe volume loss, edema and pneumonia. IMPRESSION: Worsening of widespread pulmonary density, possibly due to a combination of pleural fluid, lower lobe volume loss, edema and pneumonia. Electronically Signed   By: Nelson Chimes M.D.   On: 08/08/2022 10:26   DG Chest Port 1 View  Result Date: 08/07/2022 CLINICAL DATA:  Provided history: Hypoxia. EXAM: PORTABLE CHEST 1 VIEW COMPARISON:  Prior chest radiographs 08/03/2022 and earlier. FINDINGS: Cardiomegaly. Aortic atherosclerosis. Small bilateral pleural effusions, increased on the right, and similar on the left. Associated bibasilar atelectasis and/or consolidation. Prominence of the interstitial lung markings. While a component of this is chronic, superimposed interstitial edema is  suspected. New ill-defined opacity within the right upper lobe, which may reflect atelectasis, asymmetric edema or pneumonia. No evidence of pneumothorax. Spondylosis and levocurvature of the thoracic spine. IMPRESSION: Cardiomegaly with persistent interstitial edema. New ill-defined opacity within the right upper lobe, which may reflect atelectasis, asymmetric edema or pneumonia. Small bilateral pleural effusions. The right pleural effusion has increased from the prior examination of 08/03/2022. Associated bibasilar atelectasis and/or consolidation. Aortic Atherosclerosis (ICD10-I70.0). Electronically Signed   By: Kellie Simmering D.O.  On: 08/07/2022 08:52   DG Chest Portable 1 View  Result Date: 08/03/2022 CLINICAL DATA:  Weakness EXAM: PORTABLE CHEST 1 VIEW COMPARISON:  Chest x-ray 05/05/2020 FINDINGS: Heart is enlarged. Right pleural effusion has resolved. Small left pleural effusion is unchanged. There is some minimal patchy opacities in the lung bases. Central interstitial prominence has increased. There is no pneumothorax. No acute fractures are seen. IMPRESSION: 1. Cardiomegaly with increasing interstitial edema. 2. Right pleural effusion has resolved. 3. Small left pleural effusion is unchanged. Electronically Signed   By: Ronney Asters M.D.   On: 08/03/2022 17:42   CT ABDOMEN PELVIS W CONTRAST  Result Date: 08/03/2022 CLINICAL DATA:  Anemia, RIGHT lower quadrant abdominal pain, history hypertension TAHBSO, appendectomy EXAM: CT ABDOMEN AND PELVIS WITH CONTRAST TECHNIQUE: Multidetector CT imaging of the abdomen and pelvis was performed using the standard protocol following bolus administration of intravenous contrast. RADIATION DOSE REDUCTION: This exam was performed according to the departmental dose-optimization program which includes automated exposure control, adjustment of the mA and/or kV according to patient size and/or use of iterative reconstruction technique. CONTRAST:  61m OMNIPAQUE  IOHEXOL 300 MG/ML SOLN IV. No oral contrast. COMPARISON:  None Available. FINDINGS: Lower chest: Bibasilar effusions and atelectasis greater on LEFT. Mild enlargement of cardiac chambers. Small hiatal hernia. Hepatobiliary: Gallbladder and liver normal appearance Pancreas: Normal appearance Spleen: Irregular spleen with calcifications question sequela of trauma or infarct. No mass. Adrenals/Urinary Tract: Adrenal glands unremarkable. Mild LEFT renal collecting system dilatation without obstructing calculus or ureteral dilatation. RIGHT kidney and RIGHT ureter unremarkable. Bladder normal appearance. Stomach/Bowel: Appendix surgically absent by history. Scattered stool throughout colon. Elongated J-shaped stomach with small hiatal hernia. Sigmoid diverticulosis. Remaining bowel loops unremarkable. Vascular/Lymphatic: Atherosclerotic calcifications aorta, iliac arteries, visceral arteries. Aorta normal caliber. Scattered pelvic phleboliths. No adenopathy. Reproductive: Uterus and ovaries surgically absent Other: No free air or free fluid. No hernia or inflammatory process. Musculoskeletal: Osseous demineralization. Superior endplate compression deformity L1, unchanged since 05/05/2020. Trace LEFT hip joint effusion. No acute osseous findings. IMPRESSION: Bibasilar effusions and atelectasis greater on LEFT. Small hiatal hernia. Sigmoid diverticulosis without evidence of diverticulitis. Mild LEFT renal collecting system dilatation without obstructing calculus or ureteral dilatation. No acute intra-abdominal or intrapelvic abnormalities. Aortic Atherosclerosis (ICD10-I70.0). Electronically Signed   By: MLavonia DanaM.D.   On: 08/03/2022 16:37   ECHOCARDIOGRAM COMPLETE  Result Date: 07/21/2022    ECHOCARDIOGRAM REPORT   Patient Name:   BJESYCA WEISENBURGERDate of Exam: 07/21/2022 Medical Rec #:  [id number]    Height:       62.0 in Accession #:    [id number]   Weight:       115.5 lb Date of Birth:  [birth year]   BSA:           1.514 m Patient Age:    89years      BP:           120/6454 mmHg Patient Gender: F             HR:           78 bpm. Exam Location:  [facility] Procedure: 2D Echo, Color Doppler and Cardiac Doppler Indications:    I48.0 Paroxysmal atrial fibrillation; R01.1 Murmur  History:        Patient has no prior history of Echocardiogram examinations.                 LVH, PAD and COPD, Arrythmias:Atrial Fibrillation,  Signs/Symptoms:Murmur and Dizziness/Lightheadedness; Risk                 Factors:Dyslipidemia, Non-Smoker and Hypertension.  Sonographer:    Pilar Jarvis RDMS, RVT, RDCS Referring Phys: [id number] Rise Mu  Sonographer Comments: Image acquisition challenging due to respiratory motion. Very limited acoustic windows IMPRESSIONS  1. Left ventricular ejection fraction, by estimation, is 60 to 65%. The left ventricle has normal function. The left ventricle has no regional wall motion abnormalities. There is moderate left ventricular hypertrophy. Left ventricular diastolic parameters are consistent with Grade II diastolic dysfunction (pseudonormalization).  2. Right ventricular systolic function is normal. The right ventricular size is normal. There is severely elevated pulmonary artery systolic pressure. The estimated right ventricular systolic pressure is 44.9 mmHg.  3. The mitral valve is normal in structure. Mild to moderate mitral valve regurgitation. Moderate mitral stenosis by estimated mean mitral valve gradient is 9.5 mmHg. There does not appear to be significant stenosis by estimated MVA (3.67 cm sq) and visually appears to open well. Moderate mitral annular calcification.  4. Tricuspid valve regurgitation is moderate.  5. The aortic valve has an indeterminant number of cusps. There is moderate calcification of the aortic valve. Aortic valve regurgitation is not visualized. Mild to moderate aortic valve stenosis. Aortic valve area, by VTI measures 1.26 cm. Aortic valve mean gradient  measures 12.8 mmHg. Aortic valve Vmax measures 2.39 m/s.  6. The inferior vena cava is normal in size with greater than 50% respiratory variability, suggesting right atrial pressure of 3 mmHg.  7. Rhythm is normal sinus. FINDINGS  Left Ventricle: Left ventricular ejection fraction, by estimation, is 60 to 65%. The left ventricle has normal function. The left ventricle has no regional wall motion abnormalities. The left ventricular internal cavity size was normal in size. There is  moderate left ventricular hypertrophy. Left ventricular diastolic parameters are consistent with Grade II diastolic dysfunction (pseudonormalization). Right Ventricle: The right ventricular size is normal. No increase in right ventricular wall thickness. Right ventricular systolic function is normal. There is severely elevated pulmonary artery systolic pressure. The tricuspid regurgitant velocity is 3.84 m/s, and with an assumed right atrial pressure of 5 mmHg, the estimated right ventricular systolic pressure is 20.1 mmHg. Left Atrium: Left atrial size was normal in size. Right Atrium: Right atrial size was normal in size. Pericardium: There is no evidence of pericardial effusion. Mitral Valve: The mitral valve is normal in structure. There is mild calcification of the mitral valve leaflet(s). Moderate mitral annular calcification. Mild to moderate mitral valve regurgitation. Moderate mitral valve stenosis. MV peak gradient, 20.2 mmHg. The mean mitral valve gradient is 9.5 mmHg. Tricuspid Valve: The tricuspid valve is normal in structure. Tricuspid valve regurgitation is moderate . No evidence of tricuspid stenosis. Aortic Valve: The aortic valve has an indeterminant number of cusps. There is moderate calcification of the aortic valve. Aortic valve regurgitation is not visualized. Mild to moderate aortic stenosis is present. Aortic valve mean gradient measures 12.8 mmHg. Aortic valve peak gradient measures 22.8 mmHg. Aortic valve area, by  VTI measures 1.26 cm. Pulmonic Valve: The pulmonic valve was normal in structure. Pulmonic valve regurgitation is trivial. No evidence of pulmonic stenosis. Aorta: The aortic root is normal in size and structure. Venous: The inferior vena cava is normal in size with greater than 50% respiratory variability, suggesting right atrial pressure of 3 mmHg. IAS/Shunts: No atrial level shunt detected by color flow Doppler.  LEFT VENTRICLE PLAX 2D LVIDd:  3.50 cm     Diastology LVIDs:         1.70 cm     LV e' medial:    5.11 cm/s LV PW:         1.20 cm     LV E/e' medial:  38.2 LV IVS:        1.40 cm     LV e' lateral:   4.13 cm/s LVOT diam:     1.70 cm     LV E/e' lateral: 47.2 LV SV:         70 LV SV Index:   46 LVOT Area:     2.27 cm  LV Volumes (MOD) LV vol d, MOD A4C: 64.8 ml LV vol s, MOD A4C: 24.7 ml LV SV MOD A4C:     64.8 ml RIGHT VENTRICLE             IVC RV S prime:     16.20 cm/s  IVC diam: 2.50 cm TAPSE (M-mode): 2.5 cm LEFT ATRIUM             Index LA diam:        3.60 cm 2.38 cm/m LA Vol (A2C):   67.3 ml 44.46 ml/m LA Vol (A4C):   48.0 ml 31.71 ml/m LA Biplane Vol: 57.4 ml 37.92 ml/m  AORTIC VALVE                     PULMONIC VALVE AV Area (Vmax):    1.31 cm      PV Vmax:          1.06 m/s AV Area (Vmean):   1.29 cm      PV Peak grad:     4.5 mmHg AV Area (VTI):     1.26 cm      PR End Diast Vel: 5.48 msec AV Vmax:           239.00 cm/s AV Vmean:          164.250 cm/s AV VTI:            0.558 m AV Peak Grad:      22.8 mmHg AV Mean Grad:      12.8 mmHg LVOT Vmax:         138.00 cm/s LVOT Vmean:        93.200 cm/s LVOT VTI:          0.310 m LVOT/AV VTI ratio: 0.56  AORTA Ao Root diam: 2.80 cm Ao Asc diam:  3.30 cm MITRAL VALVE                TRICUSPID VALVE MV Area (PHT): 3.72 cm     TR Peak grad:   59.0 mmHg MV Area VTI:   1.16 cm     TR Vmax:        384.00 cm/s MV Peak grad:  20.2 mmHg MV Mean grad:  9.5 mmHg     SHUNTS MV Vmax:       2.24 m/s     Systemic VTI:  0.31 m MV Vmean:      142.0  cm/s   Systemic Diam: 1.70 cm MV Decel Time: 204 msec MV E velocity: 195.00 cm/s MV A velocity: 181.00 cm/s MV E/A ratio:  1.08 Ida Rogue MD Electronically signed by Ida Rogue MD Signature Date/Time: 07/21/2022/5:39:22 PM    Final     Microbiology: Results for orders placed or performed during the hospital encounter of 08/03/22  SARS  Coronavirus 2 by RT PCR (hospital order, performed in [facility] hospital lab) *cepheid single result test* Anterior Nasal Swab     Status: None   Collection Time: 08/03/22  5:07 PM   Specimen: Anterior Nasal Swab  Result Value Ref Range Status   SARS Coronavirus 2 by RT PCR NEGATIVE NEGATIVE Final    Comment: (NOTE) SARS-CoV-2 target nucleic acids are NOT DETECTED.  The SARS-CoV-2 RNA is generally detectable in upper and lower respiratory specimens during the acute phase of infection. The lowest concentration of SARS-CoV-2 viral copies this assay can detect is 250 copies / mL. A negative result does not preclude SARS-CoV-2 infection and should not be used as the sole basis for treatment or other patient management decisions.  A negative result may occur with improper specimen collection / handling, submission of specimen other than nasopharyngeal swab, presence of viral mutation(s) within the areas targeted by this assay, and inadequate number of viral copies (<250 copies / mL). A negative result must be combined with clinical observations, patient history, and epidemiological information.  Fact Sheet for Patients:   https://www.patel.info/  Fact Sheet for Healthcare Providers: https://hall.com/  This test is not yet approved or  cleared by the [place] FDA and has been authorized for detection and/or diagnosis of SARS-CoV-2 by FDA under an Emergency Use Authorization (EUA).  This EUA will remain in effect (meaning this test can be used) for the duration of the COVID-19 declaration under Section  564(b)(1) of the Act, 21 U.S.C. section 360bbb-3(b)(1), unless the authorization is terminated or revoked sooner.  Performed at [facility], [address] [place], [place] [address]     Labs: CBC: Recent Labs  Lab 08/05/22 0440 08/05/22 2132 08/06/22 0813 08/07/22 0957 08/09/22 0755 08/10/22 0758 08/11/22 0911  WBC 6.3  --  4.4 4.4 4.1 4.3 5.0  NEUTROABS 4.7  --  3.5 3.4 3.2 3.5  --   HGB 7.4*   < > 8.8* 8.9* 7.8* 7.7* 10.9*  HCT 22.1*   < > 25.7* 26.3* 23.3* 23.5* 31.5*  MCV 97.8  --  94.8 95.6 96.7 97.5 93.8  PLT 126*  --  115* 121* 90* 99* 115*   < > = values in this interval not displayed.   Basic Metabolic Panel: Recent Labs  Lab 08/05/22 0440 08/06/22 0812 08/07/22 0957 08/09/22 0755 08/10/22 0758  NA 137 138 139 136 136  K 3.8 3.8 3.9 3.5 3.9  CL 106 106 106 103 102  CO2 _0 GLUCOSE 93 96 102* 95 94  BUN _1 24* 21  CREATININE 1.47* 1.48* 1.52* 1.57* 1.58*  CALCIUM 9.2 9.3 9.4 9.0 9.1  MG 2.3 2.0  --  2.2  --    Liver Function Tests: No results for input(s): "AST", "ALT", "ALKPHOS", "BILITOT", "PROT", "ALBUMIN" in the last 168 hours. CBG: No results for input(s): "GLUCAP" in the last 168 hours.  Discharge time spent: less than 30 minutes.  Signed: Annita Brod, MD [facility] 08/11/2022

## 2022-08-11 NOTE — TOC Transition Note (Signed)
Transition of Care [facility]) - CM/SW Discharge Note   Patient Details  Name: Deanna White MRN: [id number] Date of Birth: Dec 07, 1924  [facility] of Care [facility]) CM/SW Contact:  Candie Chroman, LCSW Phone Number: 08/11/2022, 2:25 PM   Clinical Narrative:  Patient has orders to discharge home today. Left message for [facility] liaison to notify. Per [employer] liaison, family requesting POC eval. MD ordered. No further concerns. CSW signing off.   Final next level of care: Home w Home Health Services Barriers to Discharge: Barriers Resolved   Patient Goals and CMS Choice   CMS Medicare.gov Compare Post Acute Care list provided to:: Patient Choice offered to / list presented to : Adult Children  Discharge Placement                Patient to be transferred to facility by: Family   Patient and family notified of of transfer: 08/11/22  Discharge Plan and Services     Post Acute Care Choice: Home Health          DME Arranged: Oxygen DME Agency: [employer] Date DME Agency Contacted: 08/11/22   Representative spoke with at DME Agency: Suanne Marker HH Arranged: RN, PT, OT, Nurse's Aide [facility] Agency: [place] Date [place]: 08/11/22   Representative spoke with at [place]: Adela Lank  Social Determinants of Health (SDOH) Interventions     Readmission Risk Interventions     No data to display

## 2022-08-11 NOTE — Progress Notes (Signed)
DC instructions reviewed with pt and daughter Helene Kelp.  Both verbalized understanding.  CHF, med list, and low salt diet teaching completed,  PT DC'd with home 02 with no incident.  Brought down to the main entrance for DC via [place] with this RN.  Pt to f/u with PCP, cards, oincology and has VNA, PT/OT, 02 set up for home.  Tele and IV removed.

## 2022-08-11 NOTE — Progress Notes (Addendum)
Physical Therapy Treatment Patient Details Name: Deanna White MRN: [id number] DOB: [birth year] Today's Date: 08/11/2022   History of Present Illness 88 yo female presenting with anemia.PMH HTN, B12 deficiency, right-sided heart failure, mitral valve regurgitation,  HTN, aortic stenosis, hyperlipidemia, anemia, mitral stenosis    PT Comments    Pt found supine in bed upon PT entry. Supine<>Sit with CGA and 2L O2 via [place]. Sit<>Stand with RW, CGA, 2L O2 via [place], and min VC for hand placement. Pt ambulated 220 ft with CGA, RW, and 3L O2 via [place] due to O2 stats dropping to 88% following supine to sit mobility. Pt O2 returned to 2L of O2 via [place] post mobility. Pt would benefit from skilled physical therapy to address the listed deficits (see below) to increase independence with ADLs and function. Current recommendation is HHPT with intermittent assistance to return pt to PLOF.       Recommendations for follow up therapy are one component of a multi-disciplinary discharge planning process, led by the attending physician.  Recommendations may be updated based on patient status, additional functional criteria and insurance authorization.  Follow Up Recommendations  Home health PT     Assistance Recommended at Discharge Intermittent Supervision/Assistance  Patient can return home with the following Assistance with cooking/housework;Assist for transportation;Help with stairs or ramp for entrance;A little help with bathing/dressing/bathroom;A little help with walking and/or transfers;Direct supervision/assist for medications management   Equipment Recommendations  Rolling walker (2 wheels)    Recommendations for Other Services       Precautions / Restrictions Precautions Precautions: Fall;Other (comment) Precaution Comments: monitor SP02 Restrictions Weight Bearing Restrictions: No     Mobility  Bed Mobility Overal bed mobility: Needs Assistance Bed Mobility: Supine to Sit     Supine to  sit: Min guard     General bed mobility comments: extra time, use of bed rails    Transfers Overall transfer level: Needs assistance Equipment used: Rolling walker (2 wheels) Transfers: Sit to/from Stand Sit to Stand: Min guard           General transfer comment: VC for RW hand placement    Ambulation/Gait Ambulation/Gait assistance: Min guard Gait Distance (Feet): 220 Feet Assistive device: Rolling walker (2 wheels) Gait Pattern/deviations: Step-through pattern, Decreased stride length       General Gait Details: 1 standing rest break due to O2 dropping to 88   Stairs             Wheelchair Mobility    Modified Rankin (Stroke Patients Only)       Balance Overall balance assessment: Needs assistance Sitting-balance support: Feet supported, No upper extremity supported Sitting balance-Leahy Scale: Good     Standing balance support: Bilateral upper extremity supported Standing balance-Leahy Scale: Fair                              Cognition Arousal/Alertness: Awake/alert Behavior During Therapy: WFL for tasks assessed/performed Overall Cognitive Status: Within Functional Limits for tasks assessed                                 General Comments: hard of hearing        Exercises      General Comments        Pertinent Vitals/Pain Pain Assessment Pain Assessment: No/denies pain    Home Living  Prior Function            PT Goals (current goals can now be found in the care plan section) Acute Rehab PT Goals Patient Stated Goal: to return home PT Goal Formulation: With patient Time For Goal Achievement: 08/21/22 Potential to Achieve Goals: Good Progress towards PT goals: Progressing toward goals    Frequency    Min 2X/week      PT Plan Current plan remains appropriate    Co-evaluation              AM-PAC PT "6 Clicks" Mobility   Outcome Measure  Help  needed turning from your back to your side while in a flat bed without using bedrails?: A Little Help needed moving from lying on your back to sitting on the side of a flat bed without using bedrails?: A Little Help needed moving to and from a bed to a chair (including a wheelchair)?: A Little Help needed standing up from a chair using your arms (e.g., wheelchair or bedside chair)?: A Little Help needed to walk in hospital room?: A Little Help needed climbing 3-5 steps with a railing? : A Lot 6 Click Score: 17    End of Session Equipment Utilized During Treatment: Gait belt;Oxygen Activity Tolerance: Patient tolerated treatment well Patient left: in bed;with bed alarm set;with family/visitor present;with call bell/phone within reach Nurse Communication: Mobility status PT Visit Diagnosis: Unsteadiness on feet (R26.81);Muscle weakness (generalized) (M62.81)     Time: 0092-3300 PT Time Calculation (min) (ACUTE ONLY): 25 min  Charges:  $Therapeutic Activity: 23-37 mins                    [employer], SPT  08/11/2022, 12:30 PM

## 2022-08-11 NOTE — Progress Notes (Signed)
   08/11/22 1400  Clinical Encounter Type  Visited With Patient and family together  Visit Type Follow-up  Referral From Nurse  Consult/Referral To Chaplain   Chaplain provided compassionate presence and reflective listening as patient and daughter spoke about health challenges. Chaplain offered prayer and hospitality. Chaplain services are available for follow up as needed.

## 2022-08-14 ENCOUNTER — Telehealth: Payer: Self-pay

## 2022-08-14 NOTE — Telephone Encounter (Signed)
Transition Care Management Follow-up Telephone Call Date of discharge and from where: [facility] 08-11-22 Dx: symptomatic anemia How have you been since you were released from the hospital? Feeling better  Any questions or concerns? No  Items Reviewed: Did the pt receive and understand the discharge instructions provided? Yes  Medications obtained and verified? Yes  Other? No  Any new allergies since your discharge? No  Dietary orders reviewed? Yes Do you have support at home? Yes   Home Care and Equipment/Supplies: Were home health services ordered? yes If so, what is the name of the agency? [facility]   Has the agency set up a time to come to the patient's home? yes Were any new equipment or medical supplies ordered?  Yes: oxygen  What is the name of the medical supply agency? Unsure of name  Were you able to get the supplies/equipment? yes Do you have any questions related to the use of the equipment or supplies? No  Functional Questionnaire: (I = Independent and D = Dependent) ADLs: I- WITH ASSISTANCE   Bathing/Dressing- I- WITH ASSISTANCE   Meal Prep- D  Eating- I  Maintaining continence- I  Transferring/Ambulation- I  Managing Meds- D  Follow up appointments reviewed:  PCP Hospital f/u appt confirmed? No  DAUGHTER will call back and schedule fu appt . [facility] f/u appt confirmed? Yes  Scheduled to see Dr Rockey Situ  on 08-22-22 @ 9am and Darylene Price 08-28-22 at 230pm. Are transportation arrangements needed? No  If their condition worsens, is the pt aware to call PCP or go to the Emergency Dept.? Yes Was the patient provided with contact information for the PCP's office or ED? Yes Was to pt encouraged to call back with questions or concerns? Yes   Juanda Crumble LPN [place] [facility] Dial [phone]

## 2022-08-14 NOTE — Telephone Encounter (Signed)
Patient's daughter, Money Mckeithan, called to schedule a hospital follow-up for patient.  Patient was discharged on 08/11/2022, and there are no available appointments at this time.

## 2022-08-14 NOTE — Telephone Encounter (Signed)
Hospital follow up has been scheduled for 08/21/2022.

## 2022-08-15 ENCOUNTER — Other Ambulatory Visit: Payer: Self-pay | Admitting: Internal Medicine

## 2022-08-17 ENCOUNTER — Inpatient Hospital Stay: Payer: Medicare PPO | Attending: Oncology | Admitting: Oncology

## 2022-08-17 DIAGNOSIS — I119 Hypertensive heart disease without heart failure: Secondary | ICD-10-CM | POA: Insufficient documentation

## 2022-08-17 DIAGNOSIS — D649 Anemia, unspecified: Secondary | ICD-10-CM | POA: Insufficient documentation

## 2022-08-17 DIAGNOSIS — C9000 Multiple myeloma not having achieved remission: Secondary | ICD-10-CM | POA: Diagnosis not present

## 2022-08-17 DIAGNOSIS — E039 Hypothyroidism, unspecified: Secondary | ICD-10-CM | POA: Insufficient documentation

## 2022-08-17 DIAGNOSIS — I350 Nonrheumatic aortic (valve) stenosis: Secondary | ICD-10-CM | POA: Insufficient documentation

## 2022-08-21 ENCOUNTER — Ambulatory Visit: Payer: Medicare PPO | Admitting: Internal Medicine

## 2022-08-21 ENCOUNTER — Telehealth: Payer: Self-pay | Admitting: Pharmacist

## 2022-08-21 ENCOUNTER — Encounter: Payer: Self-pay | Admitting: Internal Medicine

## 2022-08-21 ENCOUNTER — Other Ambulatory Visit (HOSPITAL_COMMUNITY): Payer: Self-pay

## 2022-08-21 ENCOUNTER — Other Ambulatory Visit: Payer: Self-pay | Admitting: Internal Medicine

## 2022-08-21 ENCOUNTER — Telehealth: Payer: Self-pay

## 2022-08-21 ENCOUNTER — Other Ambulatory Visit: Payer: Self-pay

## 2022-08-21 ENCOUNTER — Other Ambulatory Visit
Admission: RE | Admit: 2022-08-21 | Discharge: 2022-08-21 | Disposition: A | Discharge status: Home / Self Care | Payer: Medicare PPO | Source: Ambulatory Visit | Attending: Internal Medicine | Admitting: Internal Medicine

## 2022-08-21 ENCOUNTER — Telehealth: Payer: Self-pay | Admitting: Oncology

## 2022-08-21 VITALS — BP 130/60 | HR 64 | Temp 97.5°F | Ht 62.0 in | Wt 109.0 lb

## 2022-08-21 DIAGNOSIS — C9000 Multiple myeloma not having achieved remission: Secondary | ICD-10-CM

## 2022-08-21 DIAGNOSIS — D513 Other dietary vitamin B12 deficiency anemia: Secondary | ICD-10-CM

## 2022-08-21 DIAGNOSIS — D638 Anemia in other chronic diseases classified elsewhere: Secondary | ICD-10-CM | POA: Diagnosis not present

## 2022-08-21 DIAGNOSIS — R1319 Other dysphagia: Secondary | ICD-10-CM

## 2022-08-21 DIAGNOSIS — D649 Anemia, unspecified: Secondary | ICD-10-CM | POA: Insufficient documentation

## 2022-08-21 DIAGNOSIS — J9601 Acute respiratory failure with hypoxia: Secondary | ICD-10-CM

## 2022-08-21 DIAGNOSIS — Z09 Encounter for follow-up examination after completed treatment for conditions other than malignant neoplasm: Secondary | ICD-10-CM

## 2022-08-21 DIAGNOSIS — I872 Venous insufficiency (chronic) (peripheral): Secondary | ICD-10-CM

## 2022-08-21 LAB — CBC WITH DIFFERENTIAL/PLATELET
Abs Immature Granulocytes: 0.07 10*3/uL (ref 0.00–0.07)
Basophils Absolute: 0 10*3/uL (ref 0.0–0.1)
Basophils Relative: 0 %
Eosinophils Absolute: 0 10*3/uL (ref 0.0–0.5)
Eosinophils Relative: 1 %
HCT: 31.8 % — ABNORMAL LOW (ref 36.0–46.0)
Hemoglobin: 10.4 g/dL — ABNORMAL LOW (ref 12.0–15.0)
Immature Granulocytes: 1 %
Lymphocytes Relative: 19 %
Lymphs Abs: 1.1 10*3/uL (ref 0.7–4.0)
MCH: 31.4 pg (ref 26.0–34.0)
MCHC: 32.7 g/dL (ref 30.0–36.0)
MCV: 96.1 fL (ref 80.0–100.0)
Monocytes Absolute: 0.3 10*3/uL (ref 0.1–1.0)
Monocytes Relative: 6 %
Neutro Abs: 4.2 10*3/uL (ref 1.7–7.7)
Neutrophils Relative %: 73 %
Platelets: 154 10*3/uL (ref 150–400)
RBC: 3.31 MIL/uL — ABNORMAL LOW (ref 3.87–5.11)
RDW: 14.7 % (ref 11.5–15.5)
WBC: 5.7 10*3/uL (ref 4.0–10.5)
nRBC: 0 % (ref 0.0–0.2)

## 2022-08-21 LAB — COMPREHENSIVE METABOLIC PANEL
ALT: 19 U/L (ref 0–44)
AST: 41 U/L (ref 15–41)
Albumin: 3.2 g/dL — ABNORMAL LOW (ref 3.5–5.0)
Alkaline Phosphatase: 93 U/L (ref 38–126)
Anion gap: 11 (ref 5–15)
BUN: 19 mg/dL (ref 8–23)
CO2: 24 mmol/L (ref 22–32)
Calcium: 9.2 mg/dL (ref 8.9–10.3)
Chloride: 102 mmol/L (ref 98–111)
Creatinine, Ser: 1.4 mg/dL — ABNORMAL HIGH (ref 0.44–1.00)
GFR, Estimated: 35 mL/min — ABNORMAL LOW (ref >60)
Glucose, Bld: 162 mg/dL — ABNORMAL HIGH (ref 70–99)
Potassium: 3.5 mmol/L (ref 3.5–5.1)
Sodium: 137 mmol/L (ref 135–145)
Total Bilirubin: 0.6 mg/dL (ref 0.3–1.2)
Total Protein: 6.2 g/dL — ABNORMAL LOW (ref 6.5–8.1)

## 2022-08-21 MED ORDER — LENALIDOMIDE 10 MG PO CAPS: 10.0000 mg | ORAL_CAPSULE | Freq: Every day | ORAL | 0 refills | Status: DC

## 2022-08-21 MED ORDER — LENALIDOMIDE 10 MG PO CAPS: 10.0000 mg | ORAL_CAPSULE | Freq: Every day | ORAL | 5 refills | Status: DC

## 2022-08-21 MED ORDER — DEXAMETHASONE 4 MG PO TABS: 20.0000 mg | ORAL_TABLET | Freq: Every day | ORAL | 0 refills | Status: DC

## 2022-08-21 NOTE — Telephone Encounter (Addendum)
Oral Oncology Patient Advocate Encounter  Was successful in securing patient a $12,000 grant from [facility] to provide copayment coverage for Revlimid.  This will keep the out of pocket expense at $0.     [employer] ID: [id number]   The billing information is as follows and has been shared with [facility].    RxBin: [id number] PCN: [id number] Member ID: [id number] Group ID: [id number] Dates of Eligibility: 10.21.23 through 10.20.24  Fund:  Multiple Myeloma - Medicare [place] Patient [place]  [phone] (phone) [phone] (fax) 08/21/2022 12:33 PM

## 2022-08-21 NOTE — Telephone Encounter (Signed)
Oral Oncology Patient Advocate Encounter  Prior Authorization for Revlimid has been approved.    PA# [id number]  Effective dates: 10/02/2021 through 10/02/2023  Patients co-pay is $100.    Berdine Addison, [facility] Patient [place]  [phone] (phone) [phone] (fax) 08/21/2022 12:17 PM

## 2022-08-21 NOTE — Progress Notes (Unsigned)
Date:  08/22/2022   ID:  Deanna White, DOB 1925-09-29, MRN [id number]  Patient Location:  [place] [address]   Provider location:   [facility], [place] office  PCP:  Deanna Mc, MD  Cardiologist:  Arvid Right [facility]   Chief Complaint  Patient presents with   2 month follow up     [facility]; CHF/Echo 10/202023. Patient c/o shortness of breath with over exertion. Medications reviewed by the patient verbally.     History of Present Illness:    Deanna White is a 88 y.o. female  past medical history of SVT/atrial fibrillation per Holter monitor read in 2017 by Dr. Louisa White (results not available for review) Holter monitor 2019 showing short runs atrial tachycardia Previous carotid studies demonstrated: <1-39% Hypertension Anemia Hyperlipidemia PAD, followed by vascular, prior PTCA SFA, popliteal arteries Who presents for follow-up of her PAD, hyperlipidemia, SVT, atrial tachycardia  Last seen by myself in person in the clinic June 2022 Seen by one of our providers September 2023 Reported having shortness of breath on exertion Lasix was increased up to 20 mg daily up from every other day based on echo results Restarted on Crestor  In hospitral 11/23: anemia  (hemoglobin at 7, down from 12)  transfusion x 2 Followed by oncology, thinks it is multiople myeloma Plavix held Bone marrow biopsy dec 1st, pet scan tomorrow Started on steroids this week  Stopped taking the lasix at discharge, reports she only takes this as needed, has not take Lasix recently  Echocardiogram October 2023 Normal LV and RV size and function, severely elevated right heart pressures estimated 64 mmHg, moderate mitral valve stenosis by gradient but no significant stenosis by estimated mitral valve area, visually appears to open well Moderate TR  Lab work reviewed  potassium 3.5 creatinine 1.4, stable Hemoglobin 10.4  EKG personally reviewed by myself  on todays visit Normal sinus rhythm rate 78 bpm no significant ST-T wave changes  Other past medical history reviewed Prior interventional studies  -03/2019, Percutaneous transluminal angioplasty of the SFA and popliteal arteries with a 5 mm x 60 mm Lutonix balloon      --Percutaneous transluminal angioplasty left anterior artery with a 2 mm x 30 cm Ultraverse balloon  Prior history nonhealing ulcer Previously was cutting her Plavix in half secondary to bruising  No clear evidence of atrial fibrillation based on Holter review 2019 Previous Holter monitor has been requested from 2019 (no atrial fibrillation, only short runs of atrial tachycardia noted)  Currently not on anticoagulation   Back in early 2019 , syncopal episode while at church Taken to the Er Reports work-up was benign Had follow-up with cardiology [place],  Had echocardiogram stress test and Holter Was told the Holter showed atrial fibrillation Reading the notes indicates she had short runs of SVT but it was labeled atrial fibrillation   fatigue on metoprolol   ER 11/2017 For high blood pressure, did not feel well Given hydralazine norvasc increased for discharge  Holter monitor June 2017 short runs of SVT consistent with paroxysmal atrial fibrillation Felt at that time to had CHADS VASC of 2 Not treated with anticoagulation    Past Medical History:  Diagnosis Date   Actinic keratosis    Allergy    Anemia 02/25/2015   Basal cell carcinoma 06/01/2021   R temple - ED&C   Carotid artery occlusion    Colon polyps    Fall from slipping  on ice 11/2013   Heart murmur    Hypertension    Menopause    Pneumonia    Positive TB test    pt denies   Sore throat    Squamous cell carcinoma of skin 04/17/2019   L post lateral ankle, shave removal 10/15/2019   Thyroid disease    Varicose veins    Past Surgical History:  Procedure Laterality Date   ABDOMINAL HYSTERECTOMY     APPENDECTOMY     BILATERAL  SALPINGOOPHORECTOMY  1964   CESAREAN SECTION     EYE SURGERY Right March 2016   Cataract   LOWER EXTREMITY ANGIOGRAPHY Left 03/14/2019   Procedure: LOWER EXTREMITY ANGIOGRAPHY;  Surgeon: Katha Cabal, MD;  Location: [facility];  Service: Cardiovascular;  Laterality: Left;   TONSILLECTOMY     VEIN SURGERY Left    Leg     Current Meds  Medication Sig   amLODipine (NORVASC) 5 MG tablet Take 0.5 tab by mouth every AM, and take 1 tab  by mouth every PM   cloNIDine (CATAPRES) 0.1 MG tablet 1 tablet in the am and 1 In the evening   cyanocobalamin (,VITAMIN B-12,) 1000 MCG/ML injection Inject 1 mL (1,000 mcg total) into the muscle every 30 (thirty) days.   dexamethasone (DECADRON) 4 MG tablet Take 5 tablets (20 mg total) by mouth daily.   dorzolamide (TRUSOPT) 2 % ophthalmic solution Place 1 drop into both eyes 3 (three) times daily.    fluticasone (FLONASE) 50 MCG/ACT nasal spray Place 2 sprays into both nostrils daily.   furosemide (LASIX) 20 MG tablet Take 1 tablet (20 mg total) by mouth as needed (As needed for shortness of breath or weight gain.). As needed for swellling   ketoconazole (NIZORAL) 2 % cream Apply 1 Application topically at bedtime.   lenalidomide (REVLIMID) 10 MG capsule Take 1 capsule (10 mg total) by mouth daily.   levothyroxine (SYNTHROID) 75 MCG tablet TAKE 1 TABLET BY MOUTH EVERY DAY   losartan (COZAAR) 100 MG tablet TAKE 1 TABLET BY MOUTH EVERY DAY   metoprolol tartrate (LOPRESSOR) 25 MG tablet Take 0.5 tablets (12.5 mg total) by mouth 2 (two) times daily.   Multiple Vitamins-Minerals (PRESERVISION AREDS 2) CAPS Take 1 capsule by mouth 2 (two) times daily.      Allergies:   Hctz [hydrochlorothiazide], Adhesive [tape], and Brimonidine   Social History   Tobacco Use   Smoking status: Never   Smokeless tobacco: Never  Vaping Use   Vaping Use: Never used  Substance Use Topics   Alcohol use: No   Drug use: No      Family Hx: The patient's family  history includes Alcohol abuse in her brother; Cancer in her brother, brother, daughter, and paternal grandmother; Deep vein thrombosis in her mother; Heart attack in her mother and sister; Heart disease in her brother, mother, sister, and sister; Hypertension in her mother and another family member; Kidney disease in her father; Other in an other family member; Varicose Veins in her sister.  ROS:   Please see the history of present illness.    Review of Systems  Constitutional: Negative.   HENT: Negative.    Respiratory: Negative.    Cardiovascular: Negative.   Gastrointestinal: Negative.   Musculoskeletal: Negative.   Neurological: Negative.   Psychiatric/Behavioral: Negative.    All other systems reviewed and are negative.    Labs/Other Tests and Data Reviewed:    Recent Labs: 12/29/2021: TSH 1.87 08/03/2022: B  Natriuretic Peptide 708.7 08/09/2022: Magnesium 2.2 08/21/2022: ALT 19; BUN 19; Creatinine, Ser 1.40; Hemoglobin 10.4; Platelets 154; Potassium 3.5; Sodium 137   Recent Lipid Panel Lab Results  Component Value Date/Time   CHOL 137 06/19/2022 10:23 AM   TRIG 78 06/19/2022 10:23 AM   HDL 58 06/19/2022 10:23 AM   CHOLHDL 2.4 06/19/2022 10:23 AM   LDLCALC 63 06/19/2022 10:23 AM   LDLDIRECT 62 06/19/2022 10:23 AM    Wt Readings from Last 3 Encounters:  08/22/22 109 lb (49.4 kg)  08/21/22 109 lb (49.4 kg)  08/11/22 103 lb 9.9 oz (47 kg)     Exam:    Vital Signs: Vital signs may also be detailed in the HPI BP (!) 120/42 (BP Location: Left Arm, Patient Position: Sitting, Cuff Size: Normal)   Pulse 78   Ht 5' 2" (1.575 m)   Wt 109 lb (49.4 kg)   BMI 19.94 kg/m   Constitutional:  oriented to person, place, and time. No distress.  HENT:  Head: Grossly normal Eyes:  no discharge. No scleral icterus.  Neck: No JVD, + carotid bruits  Cardiovascular: Regular rate and rhythm, 2/6 systolic ejection murmur right and left sternal border Pulmonary/Chest: Clear to  auscultation bilaterally, no wheezes or rails Abdominal: Soft.  no distension.  no tenderness.  Musculoskeletal: Normal range of motion Neurological:  normal muscle tone. Coordination normal. No atrophy Skin: Skin warm and dry Psychiatric: normal affect, pleasant   ASSESSMENT & PLAN:    Atherosclerosis of artery of extremity with ulceration (HCC) -  PAD LE, prior PTCA, on asa plavix Non-smoker, no diabetes Followed by vascular surgery Was previously recommended to take Crestor 10, she is not taking this at this time  Paroxysmal atrial fibrillation (HCC) -  No clear documentation  prior Holter showing short runs atrial tachycardia No recent episodes of tachyarrhythmia  Pulmonary hypertension Markedly elevated pressures on echo, chronic shortness of breath, pleural effusions on imaging Recommended she start Lasix 20 mg every other day with potassium 20 Follow-up with CHF clinic several weeks time, lab work at that time, consideration of Lasix up to 20 daily  Peripheral vascular disease ([place]) - Followed by vascular surgery Prior angioplasty on aspirin Plavix,   Essential hypertension, benign - Blood pressure is well controlled on today's visit. No changes made to the medications.  Low diastolic pressures  Anemia, unspecified type -  Followed by primary care  Near syncope - No recent episodes, blood pressure stable  Stenosis of carotid artery, unspecified laterality - <39% b/l in 2019 Followed by vascular Attempts to have her continue on Crestor unsuccessful   Total encounter time more than 40 minutes  Greater than 50% was spent in counseling and coordination of care with the patient     Signed, Ida Rogue, MD  08/22/2022 9:19 AM    [place] Office [address], [place], [place] [address]

## 2022-08-21 NOTE — Progress Notes (Unsigned)
I connected with Deanna White on 08/21/22 at  4:00 PM EST by video enabled telemedicine visit and verified that I am speaking with the correct person using two identifiers.   I discussed the limitations, risks, security and privacy concerns of performing an evaluation and management service by telemedicine and the availability of in-person appointments. I also discussed with the patient that there may be a patient responsible charge related to this service. The patient expressed understanding and agreed to proceed.  Other persons participating in the visit and their role in the encounter:  patients daughter  Patient's location:  home Provider's location:  home  Chief Complaint: Posthospital discharge follow-up of anemia  History of present illness: patient is a 88 old female with a past medical history significant for hypertension hypothyroidism And his history of aortic stenosis mild to moderate.  She was admitted to the hospital for shortness of breath with exertion and fatigue.  Labs reveal sudden worsening of anemia.  At baseline patient's hemoglobin up until January 2023 was noted to be 12.7.  It dropped down to 11.1 in March 2023 followed by 8.1 on 07/28/2022 and presently is between 7.5-8.  White cell count has been normal with a normal differential between 4-6.  Platelet count was mildly low at 140 in January 2023 and presently at 125.    Iron studies were checked which on 08/02/2022 which showed a low transferrin of 170.  Ferritin elevated at 763 and TIBC low at 238.  EPO level was 166.  B12 levels were elevated at greater than 1500.  CMP shows low total protein of 6.4.  Creatinine mildly elevated at 1.4.  Serum calcium was normal.  Reticulocyte count low for the degree of anemia at 2.  Smear review was otherwise unremarkable other than mixed RBC population.  LDH mildly elevated at 261.  Stool occult was positive and therefore GI was consulted although there has been no evidence of overt GI  bleed.   Myeloma panel showed 0.4 g of monoclonal free kappa light chain protein free light chain ratio was 6536  Interval history she is doing better since her hospital discharge.  She is only using oxygen at night.  Energy levels are somewhat improved.   Review of Systems  Constitutional:  Positive for malaise/fatigue.    Allergies  Allergen Reactions   Hctz [Hydrochlorothiazide] Other (See Comments)    Hyponatremia    Adhesive [Tape] Rash    Peels skin   Brimonidine Rash and Other (See Comments)    Pt felt as if throat was closing up, pt also had rash    Past Medical History:  Diagnosis Date   Actinic keratosis    Allergy    Anemia 02/25/2015   Basal cell carcinoma 06/01/2021   R temple - ED&C   Carotid artery occlusion    Colon polyps    Fall from slipping on ice 11/2013   Heart murmur    Hypertension    Menopause    Pneumonia    Positive TB test    pt denies   Sore throat    Squamous cell carcinoma of skin 04/17/2019   L post lateral ankle, shave removal 10/15/2019   Thyroid disease    Varicose veins     Past Surgical History:  Procedure Laterality Date   ABDOMINAL HYSTERECTOMY     APPENDECTOMY     BILATERAL SALPINGOOPHORECTOMY  1964   CESAREAN SECTION     EYE SURGERY Right March 2016   Cataract  LOWER EXTREMITY ANGIOGRAPHY Left 03/14/2019   Procedure: LOWER EXTREMITY ANGIOGRAPHY;  Surgeon: Katha Cabal, MD;  Location: [facility];  Service: Cardiovascular;  Laterality: Left;   TONSILLECTOMY     VEIN SURGERY Left    Leg    Social History   Socioeconomic History   Marital status: Widowed    Spouse name: Not on file   Number of children: Not on file   Years of education: Not on file   Highest education level: Not on file  Occupational History   Not on file  Tobacco Use   Smoking status: Never   Smokeless tobacco: Never  Substance and Sexual Activity   Alcohol use: No   Drug use: No   Sexual activity: Never  Other Topics  Concern   Not on file  Social History Narrative   Not on file   Social Determinants of Health   Financial Resource Strain: Low Risk  (09/27/2020)   Overall Financial Resource Strain (CARDIA)    Difficulty of Paying Living Expenses: Not hard at all  Food Insecurity: No Food Insecurity (09/27/2020)   Hunger Vital Sign    Worried About Running Out of Food in the Last Year: Never true    [place] in the Last Year: Never true  Transportation Needs: No Transportation Needs (09/27/2020)   PRAPARE - Hydrologist (Medical): No    Lack of Transportation (Non-Medical): No  Physical Activity: Not on file  Stress: No Stress Concern Present (09/27/2020)   [place]    Feeling of Stress : Not at all  Social Connections: Unknown (09/27/2020)   Social Connection and Isolation Panel [NHANES]    Frequency of Communication with Friends and Family: More than three times a week    Frequency of Social Gatherings with Friends and Family: More than three times a week    Attends Religious Services: Not on file    Active Member of Clubs or Organizations: Not on file    Attends Archivist Meetings: Not on file    Marital Status: Not on file  Intimate Partner Violence: Not At Risk (09/27/2020)   Humiliation, Afraid, Rape, and Kick questionnaire    Fear of Current or Ex-Partner: No    Emotionally Abused: No    Physically Abused: No    Sexually Abused: No    Family History  Problem Relation Age of Onset   Heart disease Mother        After age 36   Hypertension Mother    Deep vein thrombosis Mother    Heart attack Mother    Kidney disease Father    Alcohol abuse Brother    Heart disease Brother    Cancer Brother        Lung cancer   Cancer Brother        kidney cancer   Varicose Veins Sister    Heart disease Sister        After age 60   Heart attack Sister    Heart disease Sister         After age 86   Other Other        epilepsy   Hypertension Other    Cancer Paternal Grandmother        breast cancer   Cancer Daughter      Current Outpatient Medications:    amLODipine (NORVASC) 5 MG tablet, Take 0.5 tab by  mouth every AM, and take 1 tab  by mouth every PM, Disp: 60 tablet, Rfl: 3   cloNIDine (CATAPRES) 0.1 MG tablet, 1 tablet in the am and 1 In the evening, Disp: 270 tablet, Rfl: 2   cyanocobalamin (,VITAMIN B-12,) 1000 MCG/ML injection, Inject 1 mL (1,000 mcg total) into the muscle every 30 (thirty) days. (Patient not taking: Reported on 08/14/2022), Disp: 10 mL, Rfl: 0   dorzolamide (TRUSOPT) 2 % ophthalmic solution, Place 1 drop into both eyes 3 (three) times daily. , Disp: , Rfl: 12   fluticasone (FLONASE) 50 MCG/ACT nasal spray, Place 2 sprays into both nostrils daily., Disp: 16 g, Rfl: 6   furosemide (LASIX) 20 MG tablet, Take 1 tablet (20 mg total) by mouth as needed (As needed for shortness of breath or weight gain.). As needed for swellling, Disp: 45 tablet, Rfl: 3   ketoconazole (NIZORAL) 2 % cream, Apply 1 Application topically at bedtime., Disp: 60 g, Rfl: 11   levothyroxine (SYNTHROID) 75 MCG tablet, TAKE 1 TABLET BY MOUTH EVERY DAY, Disp: 90 tablet, Rfl: 1   losartan (COZAAR) 100 MG tablet, TAKE 1 TABLET BY MOUTH EVERY DAY, Disp: 90 tablet, Rfl: 1   metoprolol tartrate (LOPRESSOR) 25 MG tablet, Take 0.5 tablets (12.5 mg total) by mouth 2 (two) times daily., Disp: 30 tablet, Rfl: 1   Multiple Vitamins-Minerals (PRESERVISION AREDS 2) CAPS, Take 1 capsule by mouth 2 (two) times daily. , Disp: , Rfl:    NEEDLE, DISP, 25 G 25G X 1" MISC, Use to give b12 injection once monthly., Disp: 50 each, Rfl: 0  DG Chest Port 1 View  Result Date: 08/08/2022 CLINICAL DATA:  Hypoxia EXAM: PORTABLE CHEST 1 VIEW COMPARISON:  08/07/2022 FINDINGS: Cardiomegaly. Aortic atherosclerosis. Widespread pulmonary density shows some worsening, possibly due to a combination of pleural  fluid, lower lobe volume loss, edema and pneumonia. IMPRESSION: Worsening of widespread pulmonary density, possibly due to a combination of pleural fluid, lower lobe volume loss, edema and pneumonia. Electronically Signed   By: Nelson Chimes M.D.   On: 08/08/2022 10:26   DG Chest Port 1 View  Result Date: 08/07/2022 CLINICAL DATA:  Provided history: Hypoxia. EXAM: PORTABLE CHEST 1 VIEW COMPARISON:  Prior chest radiographs 08/03/2022 and earlier. FINDINGS: Cardiomegaly. Aortic atherosclerosis. Small bilateral pleural effusions, increased on the right, and similar on the left. Associated bibasilar atelectasis and/or consolidation. Prominence of the interstitial lung markings. While a component of this is chronic, superimposed interstitial edema is suspected. New ill-defined opacity within the right upper lobe, which may reflect atelectasis, asymmetric edema or pneumonia. No evidence of pneumothorax. Spondylosis and levocurvature of the thoracic spine. IMPRESSION: Cardiomegaly with persistent interstitial edema. New ill-defined opacity within the right upper lobe, which may reflect atelectasis, asymmetric edema or pneumonia. Small bilateral pleural effusions. The right pleural effusion has increased from the prior examination of 08/03/2022. Associated bibasilar atelectasis and/or consolidation. Aortic Atherosclerosis (ICD10-I70.0). Electronically Signed   By: Kellie Simmering D.O.   On: 08/07/2022 08:52   DG Chest Portable 1 View  Result Date: 08/03/2022 CLINICAL DATA:  Weakness EXAM: PORTABLE CHEST 1 VIEW COMPARISON:  Chest x-ray 05/05/2020 FINDINGS: Heart is enlarged. Right pleural effusion has resolved. Small left pleural effusion is unchanged. There is some minimal patchy opacities in the lung bases. Central interstitial prominence has increased. There is no pneumothorax. No acute fractures are seen. IMPRESSION: 1. Cardiomegaly with increasing interstitial edema. 2. Right pleural effusion has resolved. 3. Small  left pleural effusion is unchanged. Electronically  Signed   By: Ronney Asters M.D.   On: 08/03/2022 17:42   CT ABDOMEN PELVIS W CONTRAST  Result Date: 08/03/2022 CLINICAL DATA:  Anemia, RIGHT lower quadrant abdominal pain, history hypertension TAHBSO, appendectomy EXAM: CT ABDOMEN AND PELVIS WITH CONTRAST TECHNIQUE: Multidetector CT imaging of the abdomen and pelvis was performed using the standard protocol following bolus administration of intravenous contrast. RADIATION DOSE REDUCTION: This exam was performed according to the departmental dose-optimization program which includes automated exposure control, adjustment of the mA and/or kV according to patient size and/or use of iterative reconstruction technique. CONTRAST:  44m OMNIPAQUE IOHEXOL 300 MG/ML SOLN IV. No oral contrast. COMPARISON:  None Available. FINDINGS: Lower chest: Bibasilar effusions and atelectasis greater on LEFT. Mild enlargement of cardiac chambers. Small hiatal hernia. Hepatobiliary: Gallbladder and liver normal appearance Pancreas: Normal appearance Spleen: Irregular spleen with calcifications question sequela of trauma or infarct. No mass. Adrenals/Urinary Tract: Adrenal glands unremarkable. Mild LEFT renal collecting system dilatation without obstructing calculus or ureteral dilatation. RIGHT kidney and RIGHT ureter unremarkable. Bladder normal appearance. Stomach/Bowel: Appendix surgically absent by history. Scattered stool throughout colon. Elongated J-shaped stomach with small hiatal hernia. Sigmoid diverticulosis. Remaining bowel loops unremarkable. Vascular/Lymphatic: Atherosclerotic calcifications aorta, iliac arteries, visceral arteries. Aorta normal caliber. Scattered pelvic phleboliths. No adenopathy. Reproductive: Uterus and ovaries surgically absent Other: No free air or free fluid. No hernia or inflammatory process. Musculoskeletal: Osseous demineralization. Superior endplate compression deformity L1, unchanged since  05/05/2020. Trace LEFT hip joint effusion. No acute osseous findings. IMPRESSION: Bibasilar effusions and atelectasis greater on LEFT. Small hiatal hernia. Sigmoid diverticulosis without evidence of diverticulitis. Mild LEFT renal collecting system dilatation without obstructing calculus or ureteral dilatation. No acute intra-abdominal or intrapelvic abnormalities. Aortic Atherosclerosis (ICD10-I70.0). Electronically Signed   By: MLavonia DanaM.D.   On: 08/03/2022 16:37    No images are attached to the encounter.      Latest Ref Rng & Units 08/10/2022    7:58 AM  CMP  Glucose 70 - 99 mg/dL 94   BUN 8 - 23 mg/dL 21   Creatinine 0.44 - 1.00 mg/dL 1.58   Sodium 135 - 145 mmol/L 136   Potassium 3.5 - 5.1 mmol/L 3.9   Chloride 98 - 111 mmol/L 102   CO2 22 - 32 mmol/L 24   Calcium 8.9 - 10.3 mg/dL 9.1       Latest Ref Rng & Units 08/11/2022    9:11 AM  CBC  WBC 4.0 - 10.5 K/uL 5.0   Hemoglobin 12.0 - 15.0 g/dL 10.9   Hematocrit 36.0 - 46.0 % 31.5   Platelets 150 - 400 K/uL 115      Observation/objective: Appears in no acute distress over video visit today.  Breathing is nonlabored  Assessment and plan: Patient is a 88year old female who was seen in patient for normocytic anemia  Normocytic anemia: Labs were not indicated of iron deficiency.  Her hemoglobin had dropped down to 7.5 in November 2023 as compared to 11.1 in March 2023.  She also developed AKI with an elevated serum creatinine of 1.4 as compared to 0.9 in March 2023.  Anemia work-up shows kappa free light chain monoclonal protein on SPEP.  No heavy protein detected on SPEP.  Serum free kappa light chain was significantly elevated at [address] with a free light chain ratio of 6536.  Elevated free light chain ratio of greater than 100 is diagnostic of multiple myeloma and I am concerned that her anemia and her mildly elevated renal  functions/AKI is also secondary to her underlying myeloma.  Patient likely has free light chain kappa  multiple myeloma  I am recommending getting a bone marrow biopsy as well as a PET scan at this time.  I will follow-up on the repeat CBC and BMP that she will be getting on Monday with Dr. Lupita Dawn office.  I will start her on pulse dose Decadron 20 mg daily for 4 days.  I will have our office start working on getting Revlimid approval while we await the results of bone marrow biopsy and PET scan  Follow-up instructions: I will see her back in about 1 week's time to discuss her PET scan and bone marrow biopsy results and further management.  Given her advanced age and planning to start her on Revlimid and dexamethasone to begin with and based on her tolerance consider adding Velcade down the line  I discussed the assessment and treatment plan with the patient. The patient was provided an opportunity to ask questions and all were answered. The patient agreed with the plan and demonstrated an understanding of the instructions.   The patient was advised to call back or seek an in-person evaluation if the symptoms worsen or if the condition fails to improve as anticipated.  I provided 20 minutes of face-to-face video visit time during this encounter, and > 50% was spent counseling as documented under my assessment & plan.  Visit Diagnosis: 1. Normocytic anemia   2. Multiple myeloma not having achieved remission ([place])     Dr. Randa Evens, MD, MPH [facility] at [facility] [address] Tel- [phone] 08/21/2022 8:18 AM

## 2022-08-21 NOTE — Telephone Encounter (Signed)
Oral Oncology Patient Advocate Encounter   Received notification that prior authorization for Revlimid is required.   PA submitted on 11.20.23  Key [id number]  Status is pending     Deanna White, [place] Patient [place]  [phone] (phone) [phone] (fax) 08/21/2022 8:51 AM

## 2022-08-21 NOTE — Patient Instructions (Addendum)
I will make a swallow evaluation  referral  Try to moisten every morsel of food with water BEFORE you try to swallow.  And chew thoroughly   Try using milk or another liquid heavier than water to get your pills down

## 2022-08-21 NOTE — Assessment & Plan Note (Signed)
Patient is stable post discharge and has no new issues or questions about discharge plans at the visit today for hospital follow up. All labs , imaging studies and progress notes from admission were reviewed with patient today   

## 2022-08-21 NOTE — Assessment & Plan Note (Signed)
Preliminary diagnosis without bone marrow biopsy (deferred due to hypoemia during admission)  is Light chain multiple myeloma  with criteria met by  serology.  Awaiting bone marrow biopsy  hgb is stable  post discharge .  She has been started on steroids and has follow up with Dr Janese Banks in the near future   Lab Results  Component Value Date   WBC 5.7 08/21/2022   HGB 10.4 (L) 08/21/2022   HCT 31.8 (L) 08/21/2022   MCV 96.1 08/21/2022   PLT 154 08/21/2022

## 2022-08-21 NOTE — Telephone Encounter (Signed)
Left VM with patient's daughter and requested a call back. Patient was d/c on oxygen and seems to need assistance ambulating. I need to confirm so that I can schedule her at the appropriate location.

## 2022-08-21 NOTE — Telephone Encounter (Signed)
Oral Oncology Pharmacist Encounter  Received new prescription for Revlimid (lenalidomide) for the treatment of newly diagnosed kappa light chain multiple myeloma in conjunction with bortezomib, planned duration until disease control or unacceptable drug toxicity.  CMP from 08/10/22 assessed, SCr 1.58 and CrCl ~68m/min. Prescription dose and frequency assessed. Dose decreased for patient's renal function.  Current medication list in Epic reviewed, no DDIs with lenalidomide identified.  Evaluated chart and no patient barriers to medication adherence identified.   [facility].  Oral Oncology Clinic will continue to follow for insurance authorization, copayment issues, initial counseling and start date.   ADarl Pikes PharmD, BCPS, BCOP, CPP Hematology/Oncology Clinical Pharmacist Practitioner [place]/DB/AP [phone] 08/21/2022 8:46 AM

## 2022-08-21 NOTE — Progress Notes (Unsigned)
Subjective:  [name], female    DOB: [birth year]  Age: 88 y.o. MRN: [id number]  CC: The primary encounter diagnosis was Anemia of chronic disease. Diagnoses of Esophageal dysphagia, Hospital discharge follow-up, and Acute hypoxic respiratory failure ([place]) were also pertinent to this visit.   HPI ANALYSSE QUINONEZ presents for hospital follow up.  She is accompanied by her daughter Helene Kelp.  Chief Complaint  Patient presents with   [facility] follow up     Admitted to [facility] on November 2 with symptomatic anemia.  Workup inclued GI And oncology consults.. light chain Multiple myeloma diagnosed .  Was trnfused  2 units prbc's during hospitalization.  MM by seroligic criteria,  BM biopsy postponed due to new onset hypoxemia .  She was  discharged to home on Nov  10  Home health RN,  PT and OT have all had one visit.    [place] has discontinued daytime oxygen  , but using it at night.  Tolerating the oxygen but having minor nose bleeds.     Also  having distressing tinnitus, aggravated by use of hearings aids Has been having trouble swallowing large pills including eye vitamin and potassium . Throat feels dry quite often   Feels fatigued,  but energy and weakness have been improved since discharge/   Lab Results  Component Value Date   VITAMINB12 1,218 (H) 08/03/2022     Outpatient Medications Prior to Visit  Medication Sig Dispense Refill   amLODipine (NORVASC) 5 MG tablet Take 0.5 tab by mouth every AM, and take 1 tab  by mouth every PM 60 tablet 3   cloNIDine (CATAPRES) 0.1 MG tablet 1 tablet in the am and 1 In the evening 270 tablet 2   cyanocobalamin (,VITAMIN B-12,) 1000 MCG/ML injection Inject 1 mL (1,000 mcg total) into the muscle every 30 (thirty) days. 10 mL 0   dexamethasone (DECADRON) 4 MG tablet Take 5 tablets (20 mg total) by mouth daily. 20 tablet 0   dorzolamide (TRUSOPT) 2 % ophthalmic solution Place 1 drop into both eyes 3 (three) times daily.    12   fluticasone (FLONASE) 50 MCG/ACT nasal spray Place 2 sprays into both nostrils daily. 16 g 6   furosemide (LASIX) 20 MG tablet Take 1 tablet (20 mg total) by mouth as needed (As needed for shortness of breath or weight gain.). As needed for swellling 45 tablet 3   ketoconazole (NIZORAL) 2 % cream Apply 1 Application topically at bedtime. 60 g 11   levothyroxine (SYNTHROID) 75 MCG tablet TAKE 1 TABLET BY MOUTH EVERY DAY 90 tablet 1   losartan (COZAAR) 100 MG tablet TAKE 1 TABLET BY MOUTH EVERY DAY 90 tablet 1   metoprolol tartrate (LOPRESSOR) 25 MG tablet Take 0.5 tablets (12.5 mg total) by mouth 2 (two) times daily. 30 tablet 1   Multiple Vitamins-Minerals (PRESERVISION AREDS 2) CAPS Take 1 capsule by mouth 2 (two) times daily.      NEEDLE, DISP, 25 G 25G X 1" MISC Use to give b12 injection once monthly. 50 each 0   lenalidomide (REVLIMID) 10 MG capsule Take 1 capsule (10 mg total) by mouth daily. 30 capsule 5   No facility-administered medications prior to visit.    Review of Systems;  Patient denies headache, fevers, malaise, unintentional weight loss, skin rash, eye pain, sinus congestion and sinus pain, sore throat, dysphagia,  hemoptysis , cough, dyspnea, wheezing, chest pain, palpitations, orthopnea, edema, abdominal pain,  nausea, melena, diarrhea, constipation, flank pain, dysuria, hematuria, urinary  Frequency, nocturia, numbness, tingling, seizures,  Focal weakness, Loss of consciousness,  Tremor, insomnia, depression, anxiety, and suicidal ideation.      Objective:  BP 130/60 (BP Location: Left Arm, Patient Position: Sitting, Cuff Size: Normal)   Pulse 64   Temp (!) 97.5 F (36.4 C) (Oral)   Ht _0  (1.575 m)   Wt 109 lb (49.4 kg)   SpO2 94%   BMI 19.94 kg/m   BP Readings from Last 3 Encounters:  08/21/22 130/60  08/11/22 (!) 142/52  07/28/22 (!) 150/62    Wt Readings from Last 3 Encounters:  08/21/22 109 lb (49.4 kg)  08/11/22 103 lb 9.9 oz (47 kg)  07/28/22  111 lb 9.6 oz (50.6 kg)    General appearance: alert, cooperative and appears stated age Ears: normal TM's and external ear canals both ears Throat: lips, mucosa, and tongue normal; teeth and gums normal Neck: no adenopathy, no carotid bruit, supple, symmetrical, trachea midline and thyroid not enlarged, symmetric, no tenderness/mass/nodules Back: symmetric, no curvature. ROM normal. No CVA tenderness. Lungs: clear to auscultation bilaterally Heart: regular rate and rhythm, S1, S2 normal, no murmur, click, rub or gallop Abdomen: soft, non-tender; bowel sounds normal; no masses,  no organomegaly Pulses: 2+ and symmetric Skin: Skin color, texture, turgor normal. No rashes or lesions Lymph nodes: Cervical, supraclavicular, and axillary nodes normal. Neuro:  awake and interactive with normal mood and affect. Higher cortical functions are normal. Speech is clear without word-finding difficulty or dysarthria. Extraocular movements are intact. Visual fields of both eyes are grossly intact. Sensation to light touch is grossly intact bilaterally of upper and lower extremities. Motor examination shows 4+/5 symmetric hand grip and upper extremity and 5/5 lower extremity strength. There is no pronation or drift. Gait is non-ataxic   No results found for: "HGBA1C"  Lab Results  Component Value Date   CREATININE 1.40 (H) 08/21/2022   CREATININE 1.58 (H) 08/10/2022   CREATININE 1.57 (H) 08/09/2022    Lab Results  Component Value Date   WBC 5.7 08/21/2022   HGB 10.4 (L) 08/21/2022   HCT 31.8 (L) 08/21/2022   PLT 154 08/21/2022   GLUCOSE 162 (H) 08/21/2022   CHOL 137 06/19/2022   TRIG 78 06/19/2022   HDL 58 06/19/2022   LDLDIRECT 62 06/19/2022   LDLCALC 63 06/19/2022   ALT 19 08/21/2022   AST 41 08/21/2022   NA 137 08/21/2022   K 3.5 08/21/2022   CL 102 08/21/2022   CREATININE 1.40 (H) 08/21/2022   BUN 19 08/21/2022   CO2 24 08/21/2022   TSH 1.87 12/29/2021   MICROALBUR <0.7 10/16/2017     DG Chest Portable 1 View  Result Date: 08/03/2022 CLINICAL DATA:  Weakness EXAM: PORTABLE CHEST 1 VIEW COMPARISON:  Chest x-ray 05/05/2020 FINDINGS: Heart is enlarged. Right pleural effusion has resolved. Small left pleural effusion is unchanged. There is some minimal patchy opacities in the lung bases. Central interstitial prominence has increased. There is no pneumothorax. No acute fractures are seen. IMPRESSION: 1. Cardiomegaly with increasing interstitial edema. 2. Right pleural effusion has resolved. 3. Small left pleural effusion is unchanged. Electronically Signed   By: Ronney Asters M.D.   On: 08/03/2022 17:42   CT ABDOMEN PELVIS W CONTRAST  Result Date: 08/03/2022 CLINICAL DATA:  Anemia, RIGHT lower quadrant abdominal pain, history hypertension TAHBSO, appendectomy EXAM: CT ABDOMEN AND PELVIS WITH CONTRAST TECHNIQUE: Multidetector CT imaging of the abdomen and pelvis  was performed using the standard protocol following bolus administration of intravenous contrast. RADIATION DOSE REDUCTION: This exam was performed according to the departmental dose-optimization program which includes automated exposure control, adjustment of the mA and/or kV according to patient size and/or use of iterative reconstruction technique. CONTRAST:  70m OMNIPAQUE IOHEXOL 300 MG/ML SOLN IV. No oral contrast. COMPARISON:  None Available. FINDINGS: Lower chest: Bibasilar effusions and atelectasis greater on LEFT. Mild enlargement of cardiac chambers. Small hiatal hernia. Hepatobiliary: Gallbladder and liver normal appearance Pancreas: Normal appearance Spleen: Irregular spleen with calcifications question sequela of trauma or infarct. No mass. Adrenals/Urinary Tract: Adrenal glands unremarkable. Mild LEFT renal collecting system dilatation without obstructing calculus or ureteral dilatation. RIGHT kidney and RIGHT ureter unremarkable. Bladder normal appearance. Stomach/Bowel: Appendix surgically absent by history.  Scattered stool throughout colon. Elongated J-shaped stomach with small hiatal hernia. Sigmoid diverticulosis. Remaining bowel loops unremarkable. Vascular/Lymphatic: Atherosclerotic calcifications aorta, iliac arteries, visceral arteries. Aorta normal caliber. Scattered pelvic phleboliths. No adenopathy. Reproductive: Uterus and ovaries surgically absent Other: No free air or free fluid. No hernia or inflammatory process. Musculoskeletal: Osseous demineralization. Superior endplate compression deformity L1, unchanged since 05/05/2020. Trace LEFT hip joint effusion. No acute osseous findings. IMPRESSION: Bibasilar effusions and atelectasis greater on LEFT. Small hiatal hernia. Sigmoid diverticulosis without evidence of diverticulitis. Mild LEFT renal collecting system dilatation without obstructing calculus or ureteral dilatation. No acute intra-abdominal or intrapelvic abnormalities. Aortic Atherosclerosis (ICD10-I70.0). Electronically Signed   By: MLavonia DanaM.D.   On: 08/03/2022 16:37    Assessment & Plan:   Problem List Items Addressed This Visit     [place]    Secondary to severe anemia with  bilateral effusions,  noted on admission chest films.  In spite of her worsening chest films, her oxygen requirements have decreased since transfusion.  She is currently wearing 2l/min only with sleep ,  as her daughter has monitored her daily room air sats with actiity and they have been > 92% since discharge. .  She has emphsematous changes by prior chest films.      Anemia of chronic disease - Primary    Preliminary diagnosis without bone marrow biopsy (deferred due to hypoemia during admission)  is Light chain multiple myeloma  with criteria met by  serology.  Awaiting bone marrow biopsy  hgb is stable  post discharge .  She has been started on steroids and has follow up with Dr RJanese Banksin the near future   Lab Results  Component Value Date   WBC 5.7 08/21/2022   HGB 10.4  (L) 08/21/2022   HCT 31.8 (L) 08/21/2022   MCV 96.1 08/21/2022   PLT 154 08/21/2022        Relevant Orders   Beta 2 microglobulin, serum   CBC with Differential/Platelet (Completed)   Comprehensive metabolic panel (Completed)   Type and screen   Hospital discharge follow-up    Patient is stable post discharge and has no new issues or questions about discharge plans at the visit today for hospital follow up. All labs , imaging studies and progress notes from admission were reviewed with patient today         Other Visit Diagnoses     Esophageal dysphagia       Relevant Orders   SLP clinical swallow evaluation       Follow-up: No follow-ups on file.   TCrecencio Mc MD

## 2022-08-22 ENCOUNTER — Ambulatory Visit: Payer: Medicare PPO | Attending: Cardiovascular Disease | Admitting: Cardiovascular Disease

## 2022-08-22 ENCOUNTER — Encounter: Payer: Self-pay | Admitting: Oncology

## 2022-08-22 ENCOUNTER — Encounter: Payer: Self-pay | Admitting: Cardiovascular Disease

## 2022-08-22 ENCOUNTER — Other Ambulatory Visit: Payer: Self-pay

## 2022-08-22 VITALS — BP 120/42 | HR 78 | Ht 62.0 in | Wt 109.0 lb

## 2022-08-22 DIAGNOSIS — L97909 Non-pressure chronic ulcer of unspecified part of unspecified lower leg with unspecified severity: Secondary | ICD-10-CM

## 2022-08-22 DIAGNOSIS — I340 Nonrheumatic mitral (valve) insufficiency: Secondary | ICD-10-CM

## 2022-08-22 DIAGNOSIS — I4719 Other supraventricular tachycardia: Secondary | ICD-10-CM

## 2022-08-22 DIAGNOSIS — E785 Hyperlipidemia, unspecified: Secondary | ICD-10-CM

## 2022-08-22 DIAGNOSIS — I10 Essential (primary) hypertension: Secondary | ICD-10-CM | POA: Diagnosis not present

## 2022-08-22 DIAGNOSIS — I272 Pulmonary hypertension, unspecified: Secondary | ICD-10-CM

## 2022-08-22 DIAGNOSIS — I471 Supraventricular tachycardia, unspecified: Secondary | ICD-10-CM | POA: Diagnosis not present

## 2022-08-22 DIAGNOSIS — C9000 Multiple myeloma not having achieved remission: Secondary | ICD-10-CM

## 2022-08-22 DIAGNOSIS — I700 Atherosclerosis of aorta: Secondary | ICD-10-CM

## 2022-08-22 DIAGNOSIS — I70299 Other atherosclerosis of native arteries of extremities, unspecified extremity: Secondary | ICD-10-CM | POA: Diagnosis not present

## 2022-08-22 DIAGNOSIS — I739 Peripheral vascular disease, unspecified: Secondary | ICD-10-CM

## 2022-08-22 DIAGNOSIS — I6523 Occlusion and stenosis of bilateral carotid arteries: Secondary | ICD-10-CM

## 2022-08-22 DIAGNOSIS — J9601 Acute respiratory failure with hypoxia: Secondary | ICD-10-CM | POA: Insufficient documentation

## 2022-08-22 LAB — BETA 2 MICROGLOBULIN, SERUM: Beta-2 Microglobulin: 15 mg/L — ABNORMAL HIGH (ref 0.6–2.4)

## 2022-08-22 MED ORDER — POTASSIUM CHLORIDE ER 10 MEQ PO TBCR: 20.0000 meq | EXTENDED_RELEASE_TABLET | ORAL | 1 refills | Status: DC

## 2022-08-22 MED ORDER — FUROSEMIDE 20 MG PO TABS: 20.0000 mg | ORAL_TABLET | ORAL | 1 refills | Status: DC

## 2022-08-22 NOTE — Patient Instructions (Addendum)
Medication Instructions:  Lasix (furosemide) up to every other day (mon/wed/Friday/sat) Take with potassium 20 meq, take 2 of the 10 meq pills  If you need a refill on your cardiac medications before your next appointment, please call your pharmacy.   Lab work: BMP in one month - Please go to the [facility]. You will check in at the front desk to the right as you walk into the atrium. Valet Parking is offered if needed. - No appointment needed. You may go any day between 7 am and 6 pm.   Testing/Procedures: No new testing needed  Follow-Up: At [facility], you and your health needs are our priority.  As part of our continuing mission to provide you with exceptional heart care, we have created designated Provider Care Teams.  These Care Teams include your primary Cardiologist (physician) and Advanced Practice Providers (APPs -  Physician Assistants and Nurse Practitioners) who all work together to provide you with the care you need, when you need it.  You will need a follow up appointment in 3 months  Providers on your designated Care Team:   Murray Hodgkins, NP Christell Faith, PA-C Cadence Kathlen Mody, [place]  COVID-19 Vaccine Information can be found at: [email or web address].uk For questions related to vaccine distribution or appointments, please email [email or web address] or call [phone].

## 2022-08-22 NOTE — Assessment & Plan Note (Addendum)
Secondary to severe anemia with  bilateral effusions,  noted on admission chest films.  In spite of her worsening chest films, her oxygen requirements have decreased since transfusion.  She is currently wearing 2l/min only with sleep ,  as her daughter has monitored her daily room air sats with actiity and they have been > 92% since discharge. .  She has emphsematous changes by prior chest films.

## 2022-08-23 ENCOUNTER — Other Ambulatory Visit: Payer: Self-pay

## 2022-08-23 ENCOUNTER — Encounter
Admission: RE | Admit: 2022-08-23 | Discharge: 2022-08-23 | Disposition: A | Discharge status: Home / Self Care | Payer: Medicare PPO | Source: Ambulatory Visit | Attending: Oncology | Admitting: Oncology

## 2022-08-23 DIAGNOSIS — C9000 Multiple myeloma not having achieved remission: Secondary | ICD-10-CM | POA: Insufficient documentation

## 2022-08-23 LAB — INTELLIGEN MYELOID

## 2022-08-23 LAB — GLUCOSE, CAPILLARY: Glucose-Capillary: 79 mg/dL (ref 70–99)

## 2022-08-23 MED ORDER — FLUDEOXYGLUCOSE F - 18 (FDG) INJECTION
5.6000 | Freq: Once | INTRAVENOUS | Status: AC | PRN
  Administered 2022-08-23: 5.8 via INTRAVENOUS

## 2022-08-28 ENCOUNTER — Ambulatory Visit: Payer: Medicare PPO | Admitting: Family

## 2022-08-28 ENCOUNTER — Ambulatory Visit: Payer: Medicare PPO | Admitting: Oncology

## 2022-08-28 ENCOUNTER — Other Ambulatory Visit: Payer: Medicare PPO

## 2022-08-28 DIAGNOSIS — Z8701 Personal history of pneumonia (recurrent): Secondary | ICD-10-CM

## 2022-08-28 DIAGNOSIS — K5730 Diverticulosis of large intestine without perforation or abscess without bleeding: Secondary | ICD-10-CM

## 2022-08-28 DIAGNOSIS — N179 Acute kidney failure, unspecified: Secondary | ICD-10-CM | POA: Diagnosis not present

## 2022-08-28 DIAGNOSIS — Z9981 Dependence on supplemental oxygen: Secondary | ICD-10-CM

## 2022-08-28 DIAGNOSIS — D500 Iron deficiency anemia secondary to blood loss (chronic): Secondary | ICD-10-CM | POA: Diagnosis not present

## 2022-08-28 DIAGNOSIS — I8390 Asymptomatic varicose veins of unspecified lower extremity: Secondary | ICD-10-CM

## 2022-08-28 DIAGNOSIS — J9601 Acute respiratory failure with hypoxia: Secondary | ICD-10-CM | POA: Diagnosis not present

## 2022-08-28 DIAGNOSIS — J9811 Atelectasis: Secondary | ICD-10-CM

## 2022-08-28 DIAGNOSIS — I080 Rheumatic disorders of both mitral and aortic valves: Secondary | ICD-10-CM | POA: Diagnosis not present

## 2022-08-28 DIAGNOSIS — I739 Peripheral vascular disease, unspecified: Secondary | ICD-10-CM | POA: Diagnosis not present

## 2022-08-28 DIAGNOSIS — I6529 Occlusion and stenosis of unspecified carotid artery: Secondary | ICD-10-CM

## 2022-08-28 DIAGNOSIS — K449 Diaphragmatic hernia without obstruction or gangrene: Secondary | ICD-10-CM

## 2022-08-28 DIAGNOSIS — Z85828 Personal history of other malignant neoplasm of skin: Secondary | ICD-10-CM

## 2022-08-28 DIAGNOSIS — R011 Cardiac murmur, unspecified: Secondary | ICD-10-CM

## 2022-08-28 DIAGNOSIS — Z9181 History of falling: Secondary | ICD-10-CM

## 2022-08-28 DIAGNOSIS — E039 Hypothyroidism, unspecified: Secondary | ICD-10-CM

## 2022-08-28 DIAGNOSIS — I5033 Acute on chronic diastolic (congestive) heart failure: Secondary | ICD-10-CM | POA: Diagnosis not present

## 2022-08-28 DIAGNOSIS — I700 Atherosclerosis of aorta: Secondary | ICD-10-CM | POA: Diagnosis not present

## 2022-08-28 DIAGNOSIS — I110 Hypertensive heart disease with heart failure: Secondary | ICD-10-CM | POA: Diagnosis not present

## 2022-08-28 DIAGNOSIS — Z86010 Personal history of colonic polyps: Secondary | ICD-10-CM

## 2022-08-28 DIAGNOSIS — I471 Supraventricular tachycardia, unspecified: Secondary | ICD-10-CM | POA: Diagnosis not present

## 2022-08-29 ENCOUNTER — Inpatient Hospital Stay (HOSPITAL_BASED_OUTPATIENT_CLINIC_OR_DEPARTMENT_OTHER): Payer: Medicare PPO | Admitting: Oncology

## 2022-08-29 ENCOUNTER — Encounter: Payer: Self-pay | Admitting: Oncology

## 2022-08-29 ENCOUNTER — Inpatient Hospital Stay: Payer: Medicare PPO | Admitting: Pharmacist

## 2022-08-29 ENCOUNTER — Inpatient Hospital Stay: Payer: Medicare PPO

## 2022-08-29 ENCOUNTER — Other Ambulatory Visit: Admission: RE | Admit: 2022-08-29 | Payer: Medicare PPO | Source: Home / Self Care | Admitting: Oncology

## 2022-08-29 VITALS — BP 138/54 | HR 70 | Temp 97.5°F | Resp 16 | Wt 107.7 lb

## 2022-08-29 DIAGNOSIS — Z79899 Other long term (current) drug therapy: Secondary | ICD-10-CM

## 2022-08-29 DIAGNOSIS — C9000 Multiple myeloma not having achieved remission: Secondary | ICD-10-CM

## 2022-08-29 DIAGNOSIS — I119 Hypertensive heart disease without heart failure: Secondary | ICD-10-CM | POA: Diagnosis not present

## 2022-08-29 DIAGNOSIS — I872 Venous insufficiency (chronic) (peripheral): Secondary | ICD-10-CM

## 2022-08-29 DIAGNOSIS — Z7189 Other specified counseling: Secondary | ICD-10-CM | POA: Diagnosis not present

## 2022-08-29 DIAGNOSIS — D513 Other dietary vitamin B12 deficiency anemia: Secondary | ICD-10-CM

## 2022-08-29 DIAGNOSIS — D649 Anemia, unspecified: Secondary | ICD-10-CM | POA: Diagnosis not present

## 2022-08-29 DIAGNOSIS — E039 Hypothyroidism, unspecified: Secondary | ICD-10-CM | POA: Diagnosis not present

## 2022-08-29 DIAGNOSIS — I350 Nonrheumatic aortic (valve) stenosis: Secondary | ICD-10-CM | POA: Diagnosis not present

## 2022-08-29 LAB — COMPREHENSIVE METABOLIC PANEL
ALT: 18 U/L (ref 0–44)
AST: 21 U/L (ref 15–41)
Albumin: 3.2 g/dL — ABNORMAL LOW (ref 3.5–5.0)
Alkaline Phosphatase: 71 U/L (ref 38–126)
Anion gap: 8 (ref 5–15)
BUN: 16 mg/dL (ref 8–23)
CO2: 21 mmol/L — ABNORMAL LOW (ref 22–32)
Calcium: 9 mg/dL (ref 8.9–10.3)
Chloride: 101 mmol/L (ref 98–111)
Creatinine, Ser: 1.18 mg/dL — ABNORMAL HIGH (ref 0.44–1.00)
GFR, Estimated: 43 mL/min — ABNORMAL LOW (ref >60)
Glucose, Bld: 99 mg/dL (ref 70–99)
Potassium: 4.2 mmol/L (ref 3.5–5.1)
Sodium: 130 mmol/L — ABNORMAL LOW (ref 135–145)
Total Bilirubin: 0.4 mg/dL (ref 0.3–1.2)
Total Protein: 5.8 g/dL — ABNORMAL LOW (ref 6.5–8.1)

## 2022-08-29 LAB — CBC WITH DIFFERENTIAL/PLATELET
Abs Immature Granulocytes: 0.25 10*3/uL — ABNORMAL HIGH (ref 0.00–0.07)
Basophils Absolute: 0 10*3/uL (ref 0.0–0.1)
Basophils Relative: 0 %
Eosinophils Absolute: 0.1 10*3/uL (ref 0.0–0.5)
Eosinophils Relative: 2 %
HCT: 30.5 % — ABNORMAL LOW (ref 36.0–46.0)
Hemoglobin: 10.1 g/dL — ABNORMAL LOW (ref 12.0–15.0)
Immature Granulocytes: 4 %
Lymphocytes Relative: 20 %
Lymphs Abs: 1.2 10*3/uL (ref 0.7–4.0)
MCH: 32.3 pg (ref 26.0–34.0)
MCHC: 33.1 g/dL (ref 30.0–36.0)
MCV: 97.4 fL (ref 80.0–100.0)
Monocytes Absolute: 0.6 10*3/uL (ref 0.1–1.0)
Monocytes Relative: 11 %
Neutro Abs: 3.7 10*3/uL (ref 1.7–7.7)
Neutrophils Relative %: 63 %
Platelets: 136 10*3/uL — ABNORMAL LOW (ref 150–400)
RBC: 3.13 MIL/uL — ABNORMAL LOW (ref 3.87–5.11)
RDW: 14.9 % (ref 11.5–15.5)
WBC: 5.9 10*3/uL (ref 4.0–10.5)
nRBC: 0 % (ref 0.0–0.2)

## 2022-08-29 LAB — SAMPLE TO BLOOD BANK

## 2022-08-29 MED ORDER — DEXAMETHASONE 4 MG PO TABS: 20.0000 mg | ORAL_TABLET | ORAL | 2 refills | Status: DC

## 2022-08-29 MED ORDER — ALLOPURINOL 300 MG PO TABS: 300.0000 mg | ORAL_TABLET | Freq: Every day | ORAL | 0 refills | Status: DC

## 2022-08-29 NOTE — Progress Notes (Signed)
Hematology/Oncology Consult note [facility]  [phone] [phone]  Patient Care Team: Crecencio Mc, MD as PCP - General (Internal Medicine) Minna Merritts, MD as PCP - Cardiology (Cardiology) Corey Skains, MD as Consulting Physician (Cardiology)   Name of the patient: Deanna White  [id number]  November 03, 1924   Date of visit: 08/29/22  Diagnosis-Free kappa light chain multiple myeloma   Chief complaint/ Reason for visit-discuss PET scan results and further management  Heme/Onc history: patient is a 28 old female with a past medical history significant for hypertension hypothyroidism And his history of aortic stenosis mild to moderate.  She was admitted to the hospital for shortness of breath with exertion and fatigue.  Labs reveal sudden worsening of anemia.  At baseline patient's hemoglobin up until January 2023 was noted to be 12.7.  It dropped down to 11.1 in March 2023 followed by 8.1 on 07/28/2022 and presently is between 7.5-8.  White cell count has been normal with a normal differential between 4-6.  Platelet count was mildly low at 140 in January 2023 and presently at 125.     Iron studies were checked which on 08/02/2022 which showed a low transferrin of 170.  Ferritin elevated at 763 and TIBC low at 238.  EPO level was 166.  B12 levels were elevated at greater than 1500.  CMP shows low total protein of 6.4.  Creatinine mildly elevated at 1.4.  Serum calcium was normal.  Reticulocyte count low for the degree of anemia at 2.  Smear review was otherwise unremarkable other than mixed RBC population.  LDH mildly elevated at 261.  Stool occult was positive and therefore GI was consulted although there has been no evidence of overt GI bleed.    Myeloma panel showed 0.4 g of monoclonal free kappa light chain protein free light chain ratio was 6536.    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  PET CT scan did not show any  bony lesions.  Bone marrow biopsy is currently pending  Interval history-patient is doing well for her age.  She is not presently on oxygen.  She does report crusting in her bilateral eyelids despite using wipes to try to get rid of it.  Denies any changes in her vision.  Tolerated 4 days of Decadron well.  ECOG PS- 2 Pain scale- 0   Review of systems- Review of Systems  Constitutional:  Positive for malaise/fatigue. Negative for chills, fever and weight loss.  HENT:  Negative for congestion, ear discharge and nosebleeds.   Eyes:  Negative for blurred vision.  Respiratory:  Negative for cough, hemoptysis, sputum production, shortness of breath and wheezing.   Cardiovascular:  Negative for chest pain, palpitations, orthopnea and claudication.  Gastrointestinal:  Negative for abdominal pain, blood in stool, constipation, diarrhea, heartburn, melena, nausea and vomiting.  Genitourinary:  Negative for dysuria, flank pain, frequency, hematuria and urgency.  Musculoskeletal:  Negative for back pain, joint pain and myalgias.  Skin:  Negative for rash.  Neurological:  Negative for dizziness, tingling, focal weakness, seizures, weakness and headaches.  Endo/Heme/Allergies:  Does not bruise/bleed easily.  Psychiatric/Behavioral:  Negative for depression and suicidal ideas. The patient does not have insomnia.       Allergies  Allergen Reactions   Hctz [Hydrochlorothiazide] Other (See Comments)    Hyponatremia    Adhesive [Tape] Rash    Peels skin   Brimonidine Rash and Other (See Comments)    Pt felt as  if throat was closing up, pt also had rash     Past Medical History:  Diagnosis Date   Actinic keratosis    Allergy    Anemia 02/25/2015   Basal cell carcinoma 06/01/2021   R temple - ED&C   Carotid artery occlusion    Colon polyps    Fall from slipping on ice 11/2013   Heart murmur    Hypertension    Menopause    Pneumonia    Positive TB test    pt denies   Sore throat     Squamous cell carcinoma of skin 04/17/2019   L post lateral ankle, shave removal 10/15/2019   Thyroid disease    Varicose veins      Past Surgical History:  Procedure Laterality Date   ABDOMINAL HYSTERECTOMY     APPENDECTOMY     BILATERAL SALPINGOOPHORECTOMY  1964   CESAREAN SECTION     EYE SURGERY Right March 2016   Cataract   LOWER EXTREMITY ANGIOGRAPHY Left 03/14/2019   Procedure: LOWER EXTREMITY ANGIOGRAPHY;  Surgeon: Katha Cabal, MD;  Location: [facility];  Service: Cardiovascular;  Laterality: Left;   TONSILLECTOMY     VEIN SURGERY Left    Leg    Social History   Socioeconomic History   Marital status: Widowed    Spouse name: Not on file   Number of children: Not on file   Years of education: Not on file   Highest education level: Not on file  Occupational History   Not on file  Tobacco Use   Smoking status: Never   Smokeless tobacco: Never  Vaping Use   Vaping Use: Never used  Substance and Sexual Activity   Alcohol use: No   Drug use: No   Sexual activity: Never  Other Topics Concern   Not on file  Social History Narrative   Not on file   Social Determinants of Health   Financial Resource Strain: Low Risk  (09/27/2020)   Overall Financial Resource Strain (CARDIA)    Difficulty of Paying Living Expenses: Not hard at all  Food Insecurity: No Food Insecurity (09/27/2020)   Hunger Vital Sign    Worried About Running Out of Food in the Last Year: Never true    [place] in the Last Year: Never true  Transportation Needs: No Transportation Needs (09/27/2020)   PRAPARE - Hydrologist (Medical): No    Lack of Transportation (Non-Medical): No  Physical Activity: Not on file  Stress: No Stress Concern Present (09/27/2020)   [place]    Feeling of Stress : Not at all  Social Connections: Unknown (09/27/2020)   Social Connection and  Isolation Panel [NHANES]    Frequency of Communication with Friends and Family: More than three times a week    Frequency of Social Gatherings with Friends and Family: More than three times a week    Attends Religious Services: Not on file    Active Member of Clubs or Organizations: Not on file    Attends Archivist Meetings: Not on file    Marital Status: Not on file  Intimate Partner Violence: Not At Risk (09/27/2020)   Humiliation, Afraid, Rape, and Kick questionnaire    Fear of Current or Ex-Partner: No    Emotionally Abused: No    Physically Abused: No    Sexually Abused: No    Family History  Problem Relation  Age of Onset   Heart disease Mother        After age 50   Hypertension Mother    Deep vein thrombosis Mother    Heart attack Mother    Kidney disease Father    Alcohol abuse Brother    Heart disease Brother    Cancer Brother        Lung cancer   Cancer Brother        kidney cancer   Varicose Veins Sister    Heart disease Sister        After age 38   Heart attack Sister    Heart disease Sister        After age 105   Other Other        epilepsy   Hypertension Other    Cancer Paternal Grandmother        breast cancer   Cancer Daughter      Current Outpatient Medications:    amLODipine (NORVASC) 5 MG tablet, Take 0.5 tab by mouth every AM, and take 1 tab  by mouth every PM, Disp: 60 tablet, Rfl: 3   cloNIDine (CATAPRES) 0.1 MG tablet, 1 tablet in the am and 1 In the evening, Disp: 270 tablet, Rfl: 2   dorzolamide (TRUSOPT) 2 % ophthalmic solution, Place 1 drop into both eyes 3 (three) times daily. , Disp: , Rfl: 12   furosemide (LASIX) 20 MG tablet, Take 1 tablet (20 mg total) by mouth every other day., Disp: 64 tablet, Rfl: 1   ketoconazole (NIZORAL) 2 % cream, Apply 1 Application topically at bedtime., Disp: 60 g, Rfl: 11   levothyroxine (SYNTHROID) 75 MCG tablet, TAKE 1 TABLET BY MOUTH EVERY DAY, Disp: 90 tablet, Rfl: 1   losartan (COZAAR) 100 MG  tablet, TAKE 1 TABLET BY MOUTH EVERY DAY, Disp: 90 tablet, Rfl: 1   metoprolol tartrate (LOPRESSOR) 25 MG tablet, Take 0.5 tablets (12.5 mg total) by mouth 2 (two) times daily., Disp: 30 tablet, Rfl: 1   Multiple Vitamins-Minerals (PRESERVISION AREDS 2) CAPS, Take 1 capsule by mouth 2 (two) times daily. , Disp: , Rfl:    potassium chloride (KLOR-CON) 10 MEQ tablet, Take 2 tablets (20 mEq total) by mouth every other day., Disp: 128 tablet, Rfl: 1   allopurinol (ZYLOPRIM) 300 MG tablet, Take 1 tablet (300 mg total) by mouth daily., Disp: 30 tablet, Rfl: 0   ASPIRIN 81 PO, Take 81 mg by mouth daily., Disp: , Rfl:    cyanocobalamin (,VITAMIN B-12,) 1000 MCG/ML injection, Inject 1 mL (1,000 mcg total) into the muscle every 30 (thirty) days. (Patient not taking: Reported on 08/29/2022), Disp: 10 mL, Rfl: 0   dexamethasone (DECADRON) 4 MG tablet, Take 5 tablets (20 mg total) by mouth once a week. Take with food., Disp: 20 tablet, Rfl: 2   fluticasone (FLONASE) 50 MCG/ACT nasal spray, Place 2 sprays into both nostrils daily. (Patient not taking: Reported on 08/29/2022), Disp: 16 g, Rfl: 6   [START ON 08/30/2022] lenalidomide (REVLIMID) 10 MG capsule, Take 10 mg by mouth daily. Take for 14 days, then hold for 7 days. Repeat every 21 days., Disp: , Rfl:    NEEDLE, DISP, 25 G 25G X 1" MISC, Use to give b12 injection once monthly. (Patient not taking: Reported on 08/22/2022), Disp: 50 each, Rfl: 0  Physical exam:  Vitals:   08/29/22 1423  BP: (!) 138/54  Pulse: 70  Resp: 16  Temp: (!) 97.5 F (36.4 C)  TempSrc: Tympanic  SpO2: 98%  Weight: 107 lb 11.2 oz (48.9 kg)   Physical Exam Constitutional:      General: She is not in acute distress. Cardiovascular:     Rate and Rhythm: Normal rate and regular rhythm.     Heart sounds: Murmur heard.  Pulmonary:     Effort: Pulmonary effort is normal.     Breath sounds: Normal breath sounds.  Abdominal:     General: Bowel sounds are normal.     Palpations:  Abdomen is soft.  Musculoskeletal:     Right lower leg: No edema.     Left lower leg: No edema.  Skin:    General: Skin is warm and dry.  Neurological:     Mental Status: She is alert and oriented to person, place, and time.         Latest Ref Rng & Units 08/29/2022    1:18 PM  CMP  Glucose 70 - 99 mg/dL 99   BUN 8 - 23 mg/dL 16   Creatinine 0.44 - 1.00 mg/dL 1.18   Sodium 135 - 145 mmol/L 130   Potassium 3.5 - 5.1 mmol/L 4.2   Chloride 98 - 111 mmol/L 101   CO2 22 - 32 mmol/L 21   Calcium 8.9 - 10.3 mg/dL 9.0   Total Protein 6.5 - 8.1 g/dL 5.8   Total Bilirubin 0.3 - 1.2 mg/dL 0.4   Alkaline Phos 38 - 126 U/L 71   AST 15 - 41 U/L 21   ALT 0 - 44 U/L 18       Latest Ref Rng & Units 08/29/2022    1:18 PM  CBC  WBC 4.0 - 10.5 K/uL 5.9   Hemoglobin 12.0 - 15.0 g/dL 10.1   Hematocrit 36.0 - 46.0 % 30.5   Platelets 150 - 400 K/uL 136     No images are attached to the encounter.  NM PET Image Initial (PI) Whole Body  Result Date: 08/25/2022 CLINICAL DATA:  Initial treatment strategy for multiple myeloma. EXAM: NUCLEAR MEDICINE PET WHOLE BODY TECHNIQUE: 5.8 mCi F-18 FDG was injected intravenously. Full-ring PET imaging was performed from the head to foot after the radiotracer. CT data was obtained and used for attenuation correction and anatomic localization. Fasting blood glucose: 79 mg/dl COMPARISON:  Abdomen and pelvis CT 08/03/2022. FINDINGS: Mediastinal blood pool activity: SUV max 1.3 HEAD/NECK: No hypermetabolic activity in the scalp. No hypermetabolic cervical lymph nodes. Incidental CT findings: none CHEST: No hypermetabolic mediastinal or hilar nodes. No suspicious pulmonary nodules on the CT scan. Incidental CT findings: The heart is enlarged. Coronary artery calcification is evident. Moderate atherosclerotic calcification is noted in the wall of the thoracic aorta. Small to moderate bilateral pleural effusions evident. Biapical pleuroparenchymal scarring noted in the  lungs, right greater than left with scattered areas of architectural distortion/scarring bilaterally. There is bilateral lower lobe collapse/consolidative opacity. ABDOMEN/PELVIS: No abnormal hypermetabolic activity within the liver, pancreas, adrenal glands, or spleen. No hypermetabolic lymph nodes in the abdomen or pelvis. Incidental CT findings: There is moderate atherosclerotic calcification of the abdominal aorta without aneurysm. Diverticular disease noted in the colon without diverticulitis. SKELETON: Diffuse low level marrow uptake identified in the bony anatomy, nonspecific. No discrete suspicious hypermetabolic bone lesion evident. Incidental CT findings: none EXTREMITIES: No abnormal hypermetabolic activity in the lower extremities. Incidental CT findings: none IMPRESSION: 1. Diffuse low level nonspecific marrow uptake identified in the bony anatomy. No discrete hypermetabolic bone lesion evident on the current study. 2. No evidence for  hypermetabolic soft tissue lesion in the neck, chest, abdomen, or pelvis. No evidence for unexpected hypermetabolism in the extremities. 3. Small to moderate bilateral pleural effusions with bilateral lower lobe collapse/consolidative opacity. 4. Colonic diverticulosis without diverticulitis. 5.  Aortic Atherosclerosis (ICD10-I70.0). Electronically Signed   By: Misty Stanley M.D.   On: 08/25/2022 13:57   DG Chest Port 1 View  Result Date: 08/08/2022 CLINICAL DATA:  Hypoxia EXAM: PORTABLE CHEST 1 VIEW COMPARISON:  08/07/2022 FINDINGS: Cardiomegaly. Aortic atherosclerosis. Widespread pulmonary density shows some worsening, possibly due to a combination of pleural fluid, lower lobe volume loss, edema and pneumonia. IMPRESSION: Worsening of widespread pulmonary density, possibly due to a combination of pleural fluid, lower lobe volume loss, edema and pneumonia. Electronically Signed   By: Nelson Chimes M.D.   On: 08/08/2022 10:26   DG Chest Port 1 View  Result Date:  08/07/2022 CLINICAL DATA:  Provided history: Hypoxia. EXAM: PORTABLE CHEST 1 VIEW COMPARISON:  Prior chest radiographs 08/03/2022 and earlier. FINDINGS: Cardiomegaly. Aortic atherosclerosis. Small bilateral pleural effusions, increased on the right, and similar on the left. Associated bibasilar atelectasis and/or consolidation. Prominence of the interstitial lung markings. While a component of this is chronic, superimposed interstitial edema is suspected. New ill-defined opacity within the right upper lobe, which may reflect atelectasis, asymmetric edema or pneumonia. No evidence of pneumothorax. Spondylosis and levocurvature of the thoracic spine. IMPRESSION: Cardiomegaly with persistent interstitial edema. New ill-defined opacity within the right upper lobe, which may reflect atelectasis, asymmetric edema or pneumonia. Small bilateral pleural effusions. The right pleural effusion has increased from the prior examination of 08/03/2022. Associated bibasilar atelectasis and/or consolidation. Aortic Atherosclerosis (ICD10-I70.0). Electronically Signed   By: Kellie Simmering D.O.   On: 08/07/2022 08:52   DG Chest Portable 1 View  Result Date: 08/03/2022 CLINICAL DATA:  Weakness EXAM: PORTABLE CHEST 1 VIEW COMPARISON:  Chest x-ray 05/05/2020 FINDINGS: Heart is enlarged. Right pleural effusion has resolved. Small left pleural effusion is unchanged. There is some minimal patchy opacities in the lung bases. Central interstitial prominence has increased. There is no pneumothorax. No acute fractures are seen. IMPRESSION: 1. Cardiomegaly with increasing interstitial edema. 2. Right pleural effusion has resolved. 3. Small left pleural effusion is unchanged. Electronically Signed   By: Ronney Asters M.D.   On: 08/03/2022 17:42   CT ABDOMEN PELVIS W CONTRAST  Result Date: 08/03/2022 CLINICAL DATA:  Anemia, RIGHT lower quadrant abdominal pain, history hypertension TAHBSO, appendectomy EXAM: CT ABDOMEN AND PELVIS WITH CONTRAST  TECHNIQUE: Multidetector CT imaging of the abdomen and pelvis was performed using the standard protocol following bolus administration of intravenous contrast. RADIATION DOSE REDUCTION: This exam was performed according to the departmental dose-optimization program which includes automated exposure control, adjustment of the mA and/or kV according to patient size and/or use of iterative reconstruction technique. CONTRAST:  42m OMNIPAQUE IOHEXOL 300 MG/ML SOLN IV. No oral contrast. COMPARISON:  None Available. FINDINGS: Lower chest: Bibasilar effusions and atelectasis greater on LEFT. Mild enlargement of cardiac chambers. Small hiatal hernia. Hepatobiliary: Gallbladder and liver normal appearance Pancreas: Normal appearance Spleen: Irregular spleen with calcifications question sequela of trauma or infarct. No mass. Adrenals/Urinary Tract: Adrenal glands unremarkable. Mild LEFT renal collecting system dilatation without obstructing calculus or ureteral dilatation. RIGHT kidney and RIGHT ureter unremarkable. Bladder normal appearance. Stomach/Bowel: Appendix surgically absent by history. Scattered stool throughout colon. Elongated J-shaped stomach with small hiatal hernia. Sigmoid diverticulosis. Remaining bowel loops unremarkable. Vascular/Lymphatic: Atherosclerotic calcifications aorta, iliac arteries, visceral arteries. Aorta normal caliber. Scattered pelvic  phleboliths. No adenopathy. Reproductive: Uterus and ovaries surgically absent Other: No free air or free fluid. No hernia or inflammatory process. Musculoskeletal: Osseous demineralization. Superior endplate compression deformity L1, unchanged since 05/05/2020. Trace LEFT hip joint effusion. No acute osseous findings. IMPRESSION: Bibasilar effusions and atelectasis greater on LEFT. Small hiatal hernia. Sigmoid diverticulosis without evidence of diverticulitis. Mild LEFT renal collecting system dilatation without obstructing calculus or ureteral dilatation. No  acute intra-abdominal or intrapelvic abnormalities. Aortic Atherosclerosis (ICD10-I70.0). Electronically Signed   By: Lavonia Dana M.D.   On: 08/03/2022 16:37     Assessment and plan- Patient is a 88 y.o. female with new diagnosis of free kappa light chain multiple myeloma  Discussed with patient and her daughter as well as granddaughter in law results of blood work in detail.  Given that her free light chain ratio is more than 100 this is diagnostic of multiple myeloma regardless of crab criteria.  Patient does have anemia but her hemoglobin has improved after blood transfusion and is remaining stable around 10.  Patient also had evidence of AKI with a creatinine of 1.4 over the last 3 months which has gone down to 1.1 presently after pulse dose steroids.  With free light chain multiple myeloma with predominantly see kidney dysfunction and anemia and to a lesser extent bone lesions.  Not surprisingly PET CT scan did not show any active bone lesions.  Peripheral blood NGS testing showed TET2 mutation which can be seen in conditions like MDS or leukemia but have also been found in patients with multiple myeloma.  It does not necessarily carry an adverse prognostic significance with multiple myeloma.  At this time I am awaiting the results of bone marrow biopsy to formally risk stratify her.  Her beta-2 microglobulin is elevated as well as mild elevation of LDH puts her in the high risk category.  I will be await cytogenetics from her bone marrow biopsy as well.  Given her age and frailty I would like to start off with her Revlimid 10 mg 2 weeks on and 1 week off along with Decadron 20 mg weekly as a treatment regimen for light chain multiple myeloma.  I will hold off on starting Velcade at this time given the risk of peripheral neuropathy.  Depending on her response I will consider adding Velcade in a month or 2.  Discussed risks and benefits of Revlimid including all but not limited to nausea,  vomiting, low blood counts, risk of infections as well as thromboembolism, skin rash and diarrhea.  Drug will be delivered to her house tomorrow and she will start taking it tomorrow onwards.  She will start on low-dose aspirin for thromboprophylaxis.  I am also starting her on allopurinol 300 mg daily.  I will repeat her BMP in 1 week and I will see her back tentatively in 3 weeks time with repeat labs including myeloma panel and serum free light chains and discussed the results of bone marrow biopsy with her at that time.  Treatment will be given with a palliative intent.   Visit Diagnosis 1. Multiple myeloma not having achieved remission ([place])   2. Goals of care, counseling/discussion   3. High risk medication use      Dr. Randa Evens, MD, MPH [facility] [phone] 08/29/2022 6:36 PM

## 2022-08-29 NOTE — Progress Notes (Signed)
Pt in for follow up, family member present.  Pt still having decreased appetite, weight down 2 lbs since last weighed in a [facility] clinic.

## 2022-08-29 NOTE — Progress Notes (Signed)
[place]  [phone] [phone]  Patient Care Team: Crecencio Mc, MD as PCP - General (Internal Medicine) Minna Merritts, MD as PCP - Cardiology (Cardiology) Corey Skains, MD as Consulting Physician (Cardiology)   Name of the patient: Deanna White  [id number]  [birth year]   Date of visit: 08/29/22  HPI: Patient is a 88 y.o. female with newly diagnosed kappa light chain multiple myeloma. Planned treatment of Revlimid (lenalidomide) and dexamethasone.   Reason for Consult: Lenalidomide oral chemotherapy education.   PAST MEDICAL HISTORY: Past Medical History:  Diagnosis Date   Actinic keratosis    Allergy    Anemia 02/25/2015   Basal cell carcinoma 06/01/2021   R temple - ED&C   Carotid artery occlusion    Colon polyps    Fall from slipping on ice 11/2013   Heart murmur    Hypertension    Menopause    Pneumonia    Positive TB test    pt denies   Sore throat    Squamous cell carcinoma of skin 04/17/2019   L post lateral ankle, shave removal 10/15/2019   Thyroid disease    Varicose veins     HEMATOLOGY/ONCOLOGY HISTORY:  Oncology History   No history exists.    ALLERGIES:  is allergic to hctz [hydrochlorothiazide], adhesive [tape], and brimonidine.  MEDICATIONS:  Current Outpatient Medications  Medication Sig Dispense Refill   allopurinol (ZYLOPRIM) 300 MG tablet Take 1 tablet (300 mg total) by mouth daily. 30 tablet 0   ASPIRIN 81 PO Take 81 mg by mouth daily.     dexamethasone (DECADRON) 4 MG tablet Take 5 tablets (20 mg total) by mouth once a week. Take with food. 20 tablet 2   amLODipine (NORVASC) 5 MG tablet Take 0.5 tab by mouth every AM, and take 1 tab  by mouth every PM 60 tablet 3   cloNIDine (CATAPRES) 0.1 MG tablet 1 tablet in the am and 1 In the evening 270 tablet 2   cyanocobalamin (,VITAMIN B-12,) 1000 MCG/ML injection Inject 1 mL (1,000 mcg total) into the muscle every 30  (thirty) days. (Patient not taking: Reported on 08/29/2022) 10 mL 0   dorzolamide (TRUSOPT) 2 % ophthalmic solution Place 1 drop into both eyes 3 (three) times daily.   12   fluticasone (FLONASE) 50 MCG/ACT nasal spray Place 2 sprays into both nostrils daily. (Patient not taking: Reported on 08/29/2022) 16 g 6   furosemide (LASIX) 20 MG tablet Take 1 tablet (20 mg total) by mouth every other day. 64 tablet 1   ketoconazole (NIZORAL) 2 % cream Apply 1 Application topically at bedtime. 60 g 11   lenalidomide (REVLIMID) 10 MG capsule Take 1 capsule (10 mg total) by mouth daily. (Patient not taking: Reported on 08/29/2022) 14 capsule 0   levothyroxine (SYNTHROID) 75 MCG tablet TAKE 1 TABLET BY MOUTH EVERY DAY 90 tablet 1   losartan (COZAAR) 100 MG tablet TAKE 1 TABLET BY MOUTH EVERY DAY 90 tablet 1   metoprolol tartrate (LOPRESSOR) 25 MG tablet Take 0.5 tablets (12.5 mg total) by mouth 2 (two) times daily. 30 tablet 1   Multiple Vitamins-Minerals (PRESERVISION AREDS 2) CAPS Take 1 capsule by mouth 2 (two) times daily.      NEEDLE, DISP, 25 G 25G X 1" MISC Use to give b12 injection once monthly. (Patient not taking: Reported on 08/22/2022) 50 each 0   potassium chloride (KLOR-CON) 10 MEQ tablet Take 2 tablets (20  mEq total) by mouth every other day. 128 tablet 1   No current facility-administered medications for this visit.    VITAL SIGNS: There were no vitals taken for this visit. There were no vitals filed for this visit.  Estimated body mass index is 19.7 kg/m as calculated from the following:   Height as of 08/22/22: _0  (1.575 m).   Weight as of an earlier encounter on 08/29/22: 48.9 kg (107 lb 11.2 oz).  LABS: CBC:    Component Value Date/Time   WBC 5.9 08/29/2022 1318   HGB 10.1 (L) 08/29/2022 1318   HCT 30.5 (L) 08/29/2022 1318   PLT 136 (L) 08/29/2022 1318   MCV 97.4 08/29/2022 1318   NEUTROABS 3.7 08/29/2022 1318   LYMPHSABS 1.2 08/29/2022 1318   MONOABS 0.6 08/29/2022 1318    EOSABS 0.1 08/29/2022 1318   BASOSABS 0.0 08/29/2022 1318   Comprehensive Metabolic Panel:    Component Value Date/Time   NA 130 (L) 08/29/2022 1318   NA 137 12/04/2018 0000   K 4.2 08/29/2022 1318   CL 101 08/29/2022 1318   CO2 21 (L) 08/29/2022 1318   BUN 16 08/29/2022 1318   BUN 16 12/04/2018 0000   CREATININE 1.18 (H) 08/29/2022 1318   GLUCOSE 99 08/29/2022 1318   CALCIUM 9.0 08/29/2022 1318   AST 21 08/29/2022 1318   ALT 18 08/29/2022 1318   ALKPHOS 71 08/29/2022 1318   BILITOT 0.4 08/29/2022 1318   PROT 5.8 (L) 08/29/2022 1318   ALBUMIN 3.2 (L) 08/29/2022 1318     Present during today's visit: Patient and her daughter Deanna White plan: Ms. Lupe will start her lenalidomide tomorrow 08/30/22   Patient Education I spoke with patient for overview of new oral chemotherapy medication: lenalidomide   Administration: Counseled patient on administration, dosing, side effects, monitoring, drug-food interactions, safe handling, storage, and disposal. Patient will take 1 capsule (10 mg total) by mouth daily. Take for 14 days, then hold for 7 days. Repeat every 21 days.  *Medication calendar and handout provided, along with NCCN patient guidelines  Side Effects: Side effects include but not limited to: rash/itchy skin, N/V, fatigue, decreased wbc/hgb/plt, constipation or diarrhea.    Drug-drug Interactions (DDI): No current DDIs with lenalidomide  Adherence: After discussion with patient no patient barriers to medication adherence identified.  Reviewed with patient importance of keeping a medication schedule and plan for any missed doses.  Ms. Wachtel voiced understanding and appreciation. All questions answered. Medication handout provided.  Provided patient with [facility] phone number. Patient knows to call the office with questions or concerns. [facility] will continue to follow.  Patient expressed understanding  and was in agreement with this plan. She also understands that She can call clinic at any time with any questions, concerns, or complaints.   Medication Access Issues: Patient fills medication at [facility] It will be delivered tomorrow.   Follow-up plan: RTC in 1 week for labs/Pharm clinic  Thank you for allowing me to participate in the care of this patient.   Time Total: 25 mins  Visit consisted of counseling and education on dealing with issues of symptom management in the setting of serious and potentially life-threatening illness.Greater than 50%  of this time was spent counseling and coordinating care related to the above assessment and plan.  Signed by: Darl Pikes, PharmD, BCPS, Salley Slaughter, CPP Hematology/Oncology Clinical Pharmacist Practitioner [place]/DB/AP [facility] [phone]  08/29/2022 3:47 PM

## 2022-08-30 LAB — BETA 2 MICROGLOBULIN, SERUM: Beta-2 Microglobulin: 15.3 mg/L — ABNORMAL HIGH (ref 0.6–2.4)

## 2022-08-31 ENCOUNTER — Other Ambulatory Visit: Payer: Self-pay | Admitting: Student

## 2022-08-31 ENCOUNTER — Ambulatory Visit: Payer: Medicare PPO | Admitting: Podiatry

## 2022-08-31 DIAGNOSIS — D638 Anemia in other chronic diseases classified elsewhere: Secondary | ICD-10-CM

## 2022-08-31 NOTE — H&P (Signed)
Chief Complaint: Patient was seen in consultation today for bone marrow biopsy with aspiration  Referring Physician(s): Rao,Archana C  Supervising Physician: Juliet Rude  Patient Status: [facility] - Out-pt  History of Present Illness: Deanna White is a 88 y.o. female with a medical history significant for HTN and aortic stenosis. She was recently admitted to the hospital for shortness of breath and labs revealed worsening anemia. A myeloma panel showed monoclonal free kappa light chains. Her free light chain ratio is more than 100 and this is diagnostic of multiple myeloma. Her oncology team would like a bone marrow biopsy for further treatment planning.  Interventional Radiology has been asked to evaluate this patient for an image-guided bone marrow biopsy with aspiration.   Past Medical History:  Diagnosis Date   Actinic keratosis    Allergy    Anemia 02/25/2015   Basal cell carcinoma 06/01/2021   R temple - ED&C   Carotid artery occlusion    Colon polyps    Fall from slipping on ice 11/2013   Heart murmur    Hypertension    Menopause    Pneumonia    Positive TB test    pt denies   Sore throat    Squamous cell carcinoma of skin 04/17/2019   L post lateral ankle, shave removal 10/15/2019   Thyroid disease    Varicose veins     Past Surgical History:  Procedure Laterality Date   ABDOMINAL HYSTERECTOMY     APPENDECTOMY     BILATERAL SALPINGOOPHORECTOMY  1964   CESAREAN SECTION     EYE SURGERY Right March 2016   Cataract   LOWER EXTREMITY ANGIOGRAPHY Left 03/14/2019   Procedure: LOWER EXTREMITY ANGIOGRAPHY;  Surgeon: Katha Cabal, MD;  Location: [facility];  Service: Cardiovascular;  Laterality: Left;   TONSILLECTOMY     VEIN SURGERY Left    Leg    Allergies: Hctz [hydrochlorothiazide], Adhesive [tape], and Brimonidine  Medications: Prior to Admission medications   Medication Sig Start Date End Date Taking? Authorizing Provider   allopurinol (ZYLOPRIM) 300 MG tablet Take 1 tablet (300 mg total) by mouth daily. 08/29/22   Sindy Guadeloupe, MD  amLODipine (NORVASC) 5 MG tablet Take 0.5 tab by mouth every AM, and take 1 tab  by mouth every PM 06/19/22   Idolina Primer, Areta Haber, PA-C  ASPIRIN 81 PO Take 81 mg by mouth daily.    [provider]  cloNIDine (CATAPRES) 0.1 MG tablet 1 tablet in the am and 1 In the evening 08/11/22   Annita Brod, MD  cyanocobalamin (,VITAMIN B-12,) 1000 MCG/ML injection Inject 1 mL (1,000 mcg total) into the muscle every 30 (thirty) days. Patient not taking: Reported on 08/29/2022 02/13/22   Crecencio Mc, MD  dexamethasone (DECADRON) 4 MG tablet Take 5 tablets (20 mg total) by mouth once a week. Take with food. 08/29/22   Sindy Guadeloupe, MD  dorzolamide (TRUSOPT) 2 % ophthalmic solution Place 1 drop into both eyes 3 (three) times daily.  07/18/16   [provider]  fluticasone (FLONASE) 50 MCG/ACT nasal spray Place 2 sprays into both nostrils daily. Patient not taking: Reported on 08/29/2022 07/28/22   Crecencio Mc, MD  furosemide (LASIX) 20 MG tablet Take 1 tablet (20 mg total) by mouth every other day. 08/22/22 11/20/22  Minna Merritts, MD  ketoconazole (NIZORAL) 2 % cream Apply 1 Application topically at bedtime. 06/01/22 05/21/24  Ralene Bathe, MD  lenalidomide (  REVLIMID) 10 MG capsule Take 10 mg by mouth daily. Take for 14 days, then hold for 7 days. Repeat every 21 days. 08/30/22   Sindy Guadeloupe, MD  levothyroxine (SYNTHROID) 75 MCG tablet TAKE 1 TABLET BY MOUTH EVERY DAY 07/03/22   Crecencio Mc, MD  losartan (COZAAR) 100 MG tablet TAKE 1 TABLET BY MOUTH EVERY DAY 08/15/22   Crecencio Mc, MD  metoprolol tartrate (LOPRESSOR) 25 MG tablet Take 0.5 tablets (12.5 mg total) by mouth 2 (two) times daily. 08/11/22   Annita Brod, MD  Multiple Vitamins-Minerals (PRESERVISION AREDS 2) CAPS Take 1 capsule by mouth 2 (two) times daily.     [provider]   NEEDLE, DISP, 25 G 25G X 1" MISC Use to give b12 injection once monthly. Patient not taking: Reported on 08/22/2022 02/13/22   Crecencio Mc, MD  potassium chloride (KLOR-CON) 10 MEQ tablet Take 2 tablets (20 mEq total) by mouth every other day. 08/22/22 11/20/22  Minna Merritts, MD     Family History  Problem Relation Age of Onset   Heart disease Mother        After age 59   Hypertension Mother    Deep vein thrombosis Mother    Heart attack Mother    Kidney disease Father    Alcohol abuse Brother    Heart disease Brother    Cancer Brother        Lung cancer   Cancer Brother        kidney cancer   Varicose Veins Sister    Heart disease Sister        After age 67   Heart attack Sister    Heart disease Sister        After age 42   Other Other        epilepsy   Hypertension Other    Cancer Paternal Grandmother        breast cancer   Cancer Daughter     Social History   Socioeconomic History   Marital status: Widowed    Spouse name: Not on file   Number of children: Not on file   Years of education: Not on file   Highest education level: Not on file  Occupational History   Not on file  Tobacco Use   Smoking status: Never   Smokeless tobacco: Never  Vaping Use   Vaping Use: Never used  Substance and Sexual Activity   Alcohol use: No   Drug use: No   Sexual activity: Never  Other Topics Concern   Not on file  Social History Narrative   Not on file   Social Determinants of Health   Financial Resource Strain: Low Risk  (09/27/2020)   Overall Financial Resource Strain (CARDIA)    Difficulty of Paying Living Expenses: Not hard at all  Food Insecurity: No Food Insecurity (09/27/2020)   Hunger Vital Sign    Worried About Running Out of Food in the Last Year: Never true    Sunflower in the Last Year: Never true  Transportation Needs: No Transportation Needs (09/27/2020)   PRAPARE - Hydrologist (Medical): No    Lack of  Transportation (Non-Medical): No  Physical Activity: Not on file  Stress: No Stress Concern Present (09/27/2020)   [place]    Feeling of Stress : Not at all  Social Connections: Unknown (09/27/2020)   Social  Connection and Isolation Panel [NHANES]    Frequency of Communication with Friends and Family: More than three times a week    Frequency of Social Gatherings with Friends and Family: More than three times a week    Attends Religious Services: Not on Advertising copywriter or Organizations: Not on file    Attends Archivist Meetings: Not on file    Marital Status: Not on file    Review of Systems: A 12 point ROS discussed and pertinent positives are indicated in the HPI above.  All other systems are negative.  Review of Systems  Constitutional:  Negative for appetite change and fatigue.  Respiratory:  Negative for cough and shortness of breath.   Cardiovascular:  Negative for chest pain and leg swelling.  Gastrointestinal:  Negative for abdominal pain, diarrhea, nausea and vomiting.  Neurological:  Negative for dizziness and headaches.    Vital Signs: BP (!) 147/55   Pulse 72   Temp 98 F (36.7 C) (Oral)   Resp 18   Ht _0  (1.575 m)   Wt 102 lb (46.3 kg)   SpO2 95%   BMI 18.66 kg/m   Physical Exam Constitutional:      General: She is not in acute distress.    Appearance: She is not ill-appearing.  HENT:     Mouth/Throat:     Mouth: Mucous membranes are moist.     Pharynx: Oropharynx is clear.  Cardiovascular:     Rate and Rhythm: Normal rate and regular rhythm.     Pulses: Normal pulses.     Heart sounds: Murmur heard.  Pulmonary:     Effort: Pulmonary effort is normal.     Breath sounds: Normal breath sounds.  Abdominal:     General: Bowel sounds are normal.     Palpations: Abdomen is soft.     Tenderness: There is no abdominal tenderness.  Skin:    General: Skin is  warm and dry.  Neurological:     Mental Status: She is alert and oriented to person, place, and time.     Imaging: NM PET Image Initial (PI) Whole Body  Result Date: 08/25/2022 CLINICAL DATA:  Initial treatment strategy for multiple myeloma. EXAM: NUCLEAR MEDICINE PET WHOLE BODY TECHNIQUE: 5.8 mCi F-18 FDG was injected intravenously. Full-ring PET imaging was performed from the head to foot after the radiotracer. CT data was obtained and used for attenuation correction and anatomic localization. Fasting blood glucose: 79 mg/dl COMPARISON:  Abdomen and pelvis CT 08/03/2022. FINDINGS: Mediastinal blood pool activity: SUV max 1.3 HEAD/NECK: No hypermetabolic activity in the scalp. No hypermetabolic cervical lymph nodes. Incidental CT findings: none CHEST: No hypermetabolic mediastinal or hilar nodes. No suspicious pulmonary nodules on the CT scan. Incidental CT findings: The heart is enlarged. Coronary artery calcification is evident. Moderate atherosclerotic calcification is noted in the wall of the thoracic aorta. Small to moderate bilateral pleural effusions evident. Biapical pleuroparenchymal scarring noted in the lungs, right greater than left with scattered areas of architectural distortion/scarring bilaterally. There is bilateral lower lobe collapse/consolidative opacity. ABDOMEN/PELVIS: No abnormal hypermetabolic activity within the liver, pancreas, adrenal glands, or spleen. No hypermetabolic lymph nodes in the abdomen or pelvis. Incidental CT findings: There is moderate atherosclerotic calcification of the abdominal aorta without aneurysm. Diverticular disease noted in the colon without diverticulitis. SKELETON: Diffuse low level marrow uptake identified in the bony anatomy, nonspecific. No discrete suspicious hypermetabolic bone lesion evident. Incidental CT findings: none EXTREMITIES:  No abnormal hypermetabolic activity in the lower extremities. Incidental CT findings: none IMPRESSION: 1. Diffuse  low level nonspecific marrow uptake identified in the bony anatomy. No discrete hypermetabolic bone lesion evident on the current study. 2. No evidence for hypermetabolic soft tissue lesion in the neck, chest, abdomen, or pelvis. No evidence for unexpected hypermetabolism in the extremities. 3. Small to moderate bilateral pleural effusions with bilateral lower lobe collapse/consolidative opacity. 4. Colonic diverticulosis without diverticulitis. 5.  Aortic Atherosclerosis (ICD10-I70.0). Electronically Signed   By: Misty Stanley M.D.   On: 08/25/2022 13:57   DG Chest Port 1 View  Result Date: 08/08/2022 CLINICAL DATA:  Hypoxia EXAM: PORTABLE CHEST 1 VIEW COMPARISON:  08/07/2022 FINDINGS: Cardiomegaly. Aortic atherosclerosis. Widespread pulmonary density shows some worsening, possibly due to a combination of pleural fluid, lower lobe volume loss, edema and pneumonia. IMPRESSION: Worsening of widespread pulmonary density, possibly due to a combination of pleural fluid, lower lobe volume loss, edema and pneumonia. Electronically Signed   By: Nelson Chimes M.D.   On: 08/08/2022 10:26   DG Chest Port 1 View  Result Date: 08/07/2022 CLINICAL DATA:  Provided history: Hypoxia. EXAM: PORTABLE CHEST 1 VIEW COMPARISON:  Prior chest radiographs 08/03/2022 and earlier. FINDINGS: Cardiomegaly. Aortic atherosclerosis. Small bilateral pleural effusions, increased on the right, and similar on the left. Associated bibasilar atelectasis and/or consolidation. Prominence of the interstitial lung markings. While a component of this is chronic, superimposed interstitial edema is suspected. New ill-defined opacity within the right upper lobe, which may reflect atelectasis, asymmetric edema or pneumonia. No evidence of pneumothorax. Spondylosis and levocurvature of the thoracic spine. IMPRESSION: Cardiomegaly with persistent interstitial edema. New ill-defined opacity within the right upper lobe, which may reflect atelectasis,  asymmetric edema or pneumonia. Small bilateral pleural effusions. The right pleural effusion has increased from the prior examination of 08/03/2022. Associated bibasilar atelectasis and/or consolidation. Aortic Atherosclerosis (ICD10-I70.0). Electronically Signed   By: Kellie Simmering D.O.   On: 08/07/2022 08:52   DG Chest Portable 1 View  Result Date: 08/03/2022 CLINICAL DATA:  Weakness EXAM: PORTABLE CHEST 1 VIEW COMPARISON:  Chest x-ray 05/05/2020 FINDINGS: Heart is enlarged. Right pleural effusion has resolved. Small left pleural effusion is unchanged. There is some minimal patchy opacities in the lung bases. Central interstitial prominence has increased. There is no pneumothorax. No acute fractures are seen. IMPRESSION: 1. Cardiomegaly with increasing interstitial edema. 2. Right pleural effusion has resolved. 3. Small left pleural effusion is unchanged. Electronically Signed   By: Ronney Asters M.D.   On: 08/03/2022 17:42   CT ABDOMEN PELVIS W CONTRAST  Result Date: 08/03/2022 CLINICAL DATA:  Anemia, RIGHT lower quadrant abdominal pain, history hypertension TAHBSO, appendectomy EXAM: CT ABDOMEN AND PELVIS WITH CONTRAST TECHNIQUE: Multidetector CT imaging of the abdomen and pelvis was performed using the standard protocol following bolus administration of intravenous contrast. RADIATION DOSE REDUCTION: This exam was performed according to the departmental dose-optimization program which includes automated exposure control, adjustment of the mA and/or kV according to patient size and/or use of iterative reconstruction technique. CONTRAST:  38m OMNIPAQUE IOHEXOL 300 MG/ML SOLN IV. No oral contrast. COMPARISON:  None Available. FINDINGS: Lower chest: Bibasilar effusions and atelectasis greater on LEFT. Mild enlargement of cardiac chambers. Small hiatal hernia. Hepatobiliary: Gallbladder and liver normal appearance Pancreas: Normal appearance Spleen: Irregular spleen with calcifications question sequela of  trauma or infarct. No mass. Adrenals/Urinary Tract: Adrenal glands unremarkable. Mild LEFT renal collecting system dilatation without obstructing calculus or ureteral dilatation. RIGHT kidney and RIGHT ureter  unremarkable. Bladder normal appearance. Stomach/Bowel: Appendix surgically absent by history. Scattered stool throughout colon. Elongated J-shaped stomach with small hiatal hernia. Sigmoid diverticulosis. Remaining bowel loops unremarkable. Vascular/Lymphatic: Atherosclerotic calcifications aorta, iliac arteries, visceral arteries. Aorta normal caliber. Scattered pelvic phleboliths. No adenopathy. Reproductive: Uterus and ovaries surgically absent Other: No free air or free fluid. No hernia or inflammatory process. Musculoskeletal: Osseous demineralization. Superior endplate compression deformity L1, unchanged since 05/05/2020. Trace LEFT hip joint effusion. No acute osseous findings. IMPRESSION: Bibasilar effusions and atelectasis greater on LEFT. Small hiatal hernia. Sigmoid diverticulosis without evidence of diverticulitis. Mild LEFT renal collecting system dilatation without obstructing calculus or ureteral dilatation. No acute intra-abdominal or intrapelvic abnormalities. Aortic Atherosclerosis (ICD10-I70.0). Electronically Signed   By: Lavonia Dana M.D.   On: 08/03/2022 16:37    Labs:  CBC: Recent Labs    08/10/22 0758 08/11/22 0911 08/21/22 1331 08/29/22 1318  WBC 4.3 5.0 5.7 5.9  HGB 7.7* 10.9* 10.4* 10.1*  HCT 23.5* 31.5* 31.8* 30.5*  PLT 99* 115* 154 136*    COAGS: No results for input(s): "INR", "APTT" in the last 8760 hours.  BMP: Recent Labs    08/09/22 0755 08/10/22 0758 08/21/22 1331 08/29/22 1318  NA 136 136 137 130*  K 3.5 3.9 3.5 4.2  CL 103 102 102 101  CO2 _0 21*  GLUCOSE 95 94 162* 99  BUN 24* _1 CALCIUM 9.0 9.1 9.2 9.0  CREATININE 1.57* 1.58* 1.40* 1.18*  GFRNONAA 30* 30* 35* 43*    LIVER FUNCTION TESTS: Recent Labs    06/19/22 1023  08/02/22 1403 08/03/22 1439 08/21/22 1331 08/29/22 1318  BILITOT 0.7  --  0.5 0.6 0.4  AST 22  --  29 41 21  ALT 10  --  _2 ALKPHOS 75  --  93 93 71  PROT 6.3* 5.5* 6.4* 6.2* 5.8*  ALBUMIN 3.7  --  3.4* 3.2* 3.2*    TUMOR MARKERS: No results for input(s): "AFPTM", "CEA", "CA199", "CHROMGRNA" in the last 8760 hours.  Assessment and Plan:  Multiple myeloma: Desere Gwin. [place], 88 year old female, presents today to the [facility] Interventional Radiology department for an image-guided bone marrow biopsy with aspiration.  Risks and benefits of this procedure were discussed with the patient and/or patient's family including, but not limited to bleeding, infection, damage to adjacent structures or low yield requiring additional tests.  All of the questions were answered and there is agreement to proceed. She has been NPO.   Consent signed and in chart.  Thank you for this interesting consult.  I greatly enjoyed meeting SARENA JEZEK and look forward to participating in their care.  A copy of this report was sent to the requesting provider on this date.  Electronically Signed: Soyla Dryer, AGACNP-BC [phone] 09/01/2022, 8:30 AM   I spent a total of  30 Minutes   in face to face in clinical consultation, greater than 50% of which was counseling/coordinating care for bone marrow biopsy with aspiration

## 2022-08-31 NOTE — Progress Notes (Signed)
Patient for Bone Marrow Biopsy on Fri 09/01/2022, I called and spoke with the patient on the phone and gave pre-procedure instructions. Pt was made aware to be here at 7:30a at the new entrance, NPO after MN prior to procedure as well as driver post procedure/recovery/discharge. Pt stated understanding. Called 08/31/2022

## 2022-09-01 ENCOUNTER — Ambulatory Visit
Admission: RE | Admit: 2022-09-01 | Discharge: 2022-09-01 | Disposition: A | Discharge status: Home / Self Care | Payer: Medicare PPO | Source: Ambulatory Visit | Attending: Oncology | Admitting: Oncology

## 2022-09-01 DIAGNOSIS — D513 Other dietary vitamin B12 deficiency anemia: Secondary | ICD-10-CM

## 2022-09-01 DIAGNOSIS — Z1379 Encounter for other screening for genetic and chromosomal anomalies: Secondary | ICD-10-CM | POA: Diagnosis not present

## 2022-09-01 DIAGNOSIS — C9000 Multiple myeloma not having achieved remission: Secondary | ICD-10-CM | POA: Diagnosis not present

## 2022-09-01 DIAGNOSIS — D638 Anemia in other chronic diseases classified elsewhere: Secondary | ICD-10-CM | POA: Insufficient documentation

## 2022-09-01 DIAGNOSIS — D4989 Neoplasm of unspecified behavior of other specified sites: Secondary | ICD-10-CM | POA: Diagnosis not present

## 2022-09-01 DIAGNOSIS — D696 Thrombocytopenia, unspecified: Secondary | ICD-10-CM | POA: Insufficient documentation

## 2022-09-01 DIAGNOSIS — I872 Venous insufficiency (chronic) (peripheral): Secondary | ICD-10-CM | POA: Diagnosis not present

## 2022-09-01 DIAGNOSIS — R7989 Other specified abnormal findings of blood chemistry: Secondary | ICD-10-CM | POA: Diagnosis present

## 2022-09-01 DIAGNOSIS — D630 Anemia in neoplastic disease: Secondary | ICD-10-CM | POA: Diagnosis not present

## 2022-09-01 LAB — CBC WITH DIFFERENTIAL/PLATELET
Abs Immature Granulocytes: 0.06 10*3/uL (ref 0.00–0.07)
Basophils Absolute: 0 10*3/uL (ref 0.0–0.1)
Basophils Relative: 0 %
Eosinophils Absolute: 0 10*3/uL (ref 0.0–0.5)
Eosinophils Relative: 1 %
HCT: 25.8 % — ABNORMAL LOW (ref 36.0–46.0)
Hemoglobin: 8.9 g/dL — ABNORMAL LOW (ref 12.0–15.0)
Immature Granulocytes: 1 %
Lymphocytes Relative: 19 %
Lymphs Abs: 1.3 10*3/uL (ref 0.7–4.0)
MCH: 32 pg (ref 26.0–34.0)
MCHC: 34.5 g/dL (ref 30.0–36.0)
MCV: 92.8 fL (ref 80.0–100.0)
Monocytes Absolute: 0.4 10*3/uL (ref 0.1–1.0)
Monocytes Relative: 5 %
Neutro Abs: 5.1 10*3/uL (ref 1.7–7.7)
Neutrophils Relative %: 74 %
Platelets: 100 10*3/uL — ABNORMAL LOW (ref 150–400)
RBC: 2.78 MIL/uL — ABNORMAL LOW (ref 3.87–5.11)
RDW: 14.9 % (ref 11.5–15.5)
WBC: 6.9 10*3/uL (ref 4.0–10.5)
nRBC: 0 % (ref 0.0–0.2)

## 2022-09-01 MED ORDER — MIDAZOLAM HCL 2 MG/2ML IJ SOLN
INTRAMUSCULAR | Status: AC
  Filled 2022-09-01: qty 2

## 2022-09-01 MED ORDER — HEPARIN SOD (PORK) LOCK FLUSH 100 UNIT/ML IV SOLN
INTRAVENOUS | Status: AC
  Filled 2022-09-01: qty 5

## 2022-09-01 MED ORDER — SODIUM CHLORIDE 0.9 % IV SOLN
INTRAVENOUS | Status: DC
  Administered 2022-09-01: 10 mL via INTRAVENOUS

## 2022-09-01 MED ORDER — FENTANYL CITRATE (PF) 100 MCG/2ML IJ SOLN
INTRAMUSCULAR | Status: AC | PRN
  Administered 2022-09-01: 25 ug via INTRAVENOUS

## 2022-09-01 MED ORDER — LIDOCAINE HCL (PF) 1 % IJ SOLN
8.0000 mL | Freq: Once | INTRAMUSCULAR | Status: AC
  Administered 2022-09-01: 8 mL

## 2022-09-01 MED ORDER — FENTANYL CITRATE (PF) 100 MCG/2ML IJ SOLN
INTRAMUSCULAR | Status: AC
  Filled 2022-09-01: qty 2

## 2022-09-01 MED ORDER — MIDAZOLAM HCL 5 MG/5ML IJ SOLN
INTRAMUSCULAR | Status: AC | PRN
  Administered 2022-09-01: .5 mg via INTRAVENOUS

## 2022-09-01 NOTE — Procedures (Signed)
Interventional Radiology Procedure Note  Date of Procedure: 09/01/2022  Procedure: BMBx  Findings:  1. CT BMBx right ilium    Complications: No immediate complications noted.   Estimated Blood Loss: minimal  Follow-up and Recommendations: 1. Bedrest 1 hour    Albin Felling, MD  Vascular & Interventional Radiology  09/01/2022 9:36 AM

## 2022-09-01 NOTE — Progress Notes (Signed)
Patient clinically stable post BMB per Dr Denna Haggard, tolerated well. Vitals stable pre and post procedure. Denies complaints at present time. Received Versed 0.5 mg along with Fentanyl 25 mcg IV for procedure. Report given to [facility] post procedure/331

## 2022-09-05 ENCOUNTER — Inpatient Hospital Stay: Payer: Medicare PPO | Attending: Oncology

## 2022-09-05 ENCOUNTER — Other Ambulatory Visit: Payer: Self-pay

## 2022-09-05 ENCOUNTER — Inpatient Hospital Stay: Payer: Medicare PPO | Admitting: Pharmacist

## 2022-09-05 DIAGNOSIS — C9000 Multiple myeloma not having achieved remission: Secondary | ICD-10-CM | POA: Diagnosis not present

## 2022-09-05 DIAGNOSIS — D649 Anemia, unspecified: Secondary | ICD-10-CM | POA: Insufficient documentation

## 2022-09-05 DIAGNOSIS — I700 Atherosclerosis of aorta: Secondary | ICD-10-CM

## 2022-09-05 DIAGNOSIS — I10 Essential (primary) hypertension: Secondary | ICD-10-CM

## 2022-09-05 DIAGNOSIS — I350 Nonrheumatic aortic (valve) stenosis: Secondary | ICD-10-CM | POA: Diagnosis not present

## 2022-09-05 DIAGNOSIS — E039 Hypothyroidism, unspecified: Secondary | ICD-10-CM | POA: Insufficient documentation

## 2022-09-05 DIAGNOSIS — I110 Hypertensive heart disease with heart failure: Secondary | ICD-10-CM | POA: Insufficient documentation

## 2022-09-05 DIAGNOSIS — I471 Supraventricular tachycardia, unspecified: Secondary | ICD-10-CM

## 2022-09-05 DIAGNOSIS — I4719 Other supraventricular tachycardia: Secondary | ICD-10-CM

## 2022-09-05 LAB — BASIC METABOLIC PANEL
Anion gap: 9 (ref 5–15)
BUN: 18 mg/dL (ref 8–23)
CO2: 25 mmol/L (ref 22–32)
Calcium: 8.9 mg/dL (ref 8.9–10.3)
Chloride: 99 mmol/L (ref 98–111)
Creatinine, Ser: 1.2 mg/dL — ABNORMAL HIGH (ref 0.44–1.00)
GFR, Estimated: 42 mL/min — ABNORMAL LOW (ref >60)
Glucose, Bld: 86 mg/dL (ref 70–99)
Potassium: 4.7 mmol/L (ref 3.5–5.1)
Sodium: 133 mmol/L — ABNORMAL LOW (ref 135–145)

## 2022-09-05 LAB — SURGICAL PATHOLOGY

## 2022-09-05 LAB — URIC ACID: Uric Acid, Serum: 2.5 mg/dL (ref 2.5–7.1)

## 2022-09-05 NOTE — Progress Notes (Signed)
[facility]  Telephone:([phone] Fax:([phone]  Patient Care Team: Crecencio Mc, MD as PCP - General (Internal Medicine) Minna Merritts, MD as PCP - Cardiology (Cardiology) Corey Skains, MD as Consulting Physician (Cardiology)   Name of the patient: Deanna White  [id number]  1924/10/17   Date of visit: 09/05/22  HPI: Patient is a 88 y.o. female with newly diagnosed kappa light chain multiple myeloma. Patient was started on Revlimid (lenalidomide) and dexamethasone on 08/30/22.  Reason for Consult: Oral chemotherapy follow-up for lenalidomide therapy.   PAST MEDICAL HISTORY: Past Medical History:  Diagnosis Date   Actinic keratosis    Allergy    Anemia 02/25/2015   Basal cell carcinoma 06/01/2021   R temple - ED&C   Carotid artery occlusion    Colon polyps    Fall from slipping on ice 11/2013   Heart murmur    Hypertension    Menopause    Pneumonia    Positive TB test    pt denies   Sore throat    Squamous cell carcinoma of skin 04/17/2019   L post lateral ankle, shave removal 10/15/2019   Thyroid disease    Varicose veins     HEMATOLOGY/ONCOLOGY HISTORY:  Oncology History   No history exists.    ALLERGIES:  is allergic to hctz [hydrochlorothiazide], adhesive [tape], and brimonidine.  MEDICATIONS:  Current Outpatient Medications  Medication Sig Dispense Refill   allopurinol (ZYLOPRIM) 300 MG tablet Take 1 tablet (300 mg total) by mouth daily. 30 tablet 0   amLODipine (NORVASC) 5 MG tablet Take 0.5 tab by mouth every AM, and take 1 tab  by mouth every PM 60 tablet 3   ASPIRIN 81 PO Take 81 mg by mouth daily.     cloNIDine (CATAPRES) 0.1 MG tablet 1 tablet in the am and 1 In the evening 270 tablet 2   cyanocobalamin (,VITAMIN B-12,) 1000 MCG/ML injection Inject 1 mL (1,000 mcg total) into the muscle every 30 (thirty) days. 10 mL 0   dexamethasone (DECADRON) 4 MG tablet Take 5 tablets (20 mg total) by  mouth once a week. Take with food. 20 tablet 2   dorzolamide (TRUSOPT) 2 % ophthalmic solution Place 1 drop into both eyes 3 (three) times daily.   12   fluticasone (FLONASE) 50 MCG/ACT nasal spray Place 2 sprays into both nostrils daily. 16 g 6   furosemide (LASIX) 20 MG tablet Take 1 tablet (20 mg total) by mouth every other day. 64 tablet 1   ketoconazole (NIZORAL) 2 % cream Apply 1 Application topically at bedtime. 60 g 11   lenalidomide (REVLIMID) 10 MG capsule Take 10 mg by mouth daily. Take for 14 days, then hold for 7 days. Repeat every 21 days.     levothyroxine (SYNTHROID) 75 MCG tablet TAKE 1 TABLET BY MOUTH EVERY DAY 90 tablet 1   losartan (COZAAR) 100 MG tablet TAKE 1 TABLET BY MOUTH EVERY DAY 90 tablet 1   metoprolol tartrate (LOPRESSOR) 25 MG tablet Take 0.5 tablets (12.5 mg total) by mouth 2 (two) times daily. 30 tablet 1   Multiple Vitamins-Minerals (PRESERVISION AREDS 2) CAPS Take 1 capsule by mouth 2 (two) times daily.      NEEDLE, DISP, 25 G 25G X 1" MISC Use to give b12 injection once monthly. (Patient not taking: Reported on 08/22/2022) 50 each 0   potassium chloride (KLOR-CON) 10 MEQ tablet Take 2 tablets (20 mEq total) by mouth every  other day. 128 tablet 1   No current facility-administered medications for this visit.    VITAL SIGNS: There were no vitals taken for this visit. There were no vitals filed for this visit.  Estimated body mass index is 18.66 kg/m as calculated from the following:   Height as of 09/01/22: _0  (1.575 m).   Weight as of 09/01/22: 46.3 kg (102 lb).  LABS: CBC:    Component Value Date/Time   WBC 6.9 09/01/2022 0756   HGB 8.9 (L) 09/01/2022 0756   HCT 25.8 (L) 09/01/2022 0756   PLT 100 (L) 09/01/2022 0756   MCV 92.8 09/01/2022 0756   NEUTROABS 5.1 09/01/2022 0756   LYMPHSABS 1.3 09/01/2022 0756   MONOABS 0.4 09/01/2022 0756   EOSABS 0.0 09/01/2022 0756   BASOSABS 0.0 09/01/2022 0756   Comprehensive Metabolic Panel:    Component  Value Date/Time   NA 130 (L) 08/29/2022 1318   NA 137 12/04/2018 0000   K 4.2 08/29/2022 1318   CL 101 08/29/2022 1318   CO2 21 (L) 08/29/2022 1318   BUN 16 08/29/2022 1318   BUN 16 12/04/2018 0000   CREATININE 1.18 (H) 08/29/2022 1318   GLUCOSE 99 08/29/2022 1318   CALCIUM 9.0 08/29/2022 1318   AST 21 08/29/2022 1318   ALT 18 08/29/2022 1318   ALKPHOS 71 08/29/2022 1318   BILITOT 0.4 08/29/2022 1318   PROT 5.8 (L) 08/29/2022 1318   ALBUMIN 3.2 (L) 08/29/2022 1318     Present during today's visit: patient and her daughter  Assessment and Plan: Continue lenalidomide 59m, 14on/7 off   Oral Chemotherapy Side Effect/Intolerance:  Fatigue: mild, patient has noticed an increase in fatigue since starting the lenalidomide, she is already taking the medication in the evening. Encouraged patient to restart her B12 injections that she was doing previously at home but stopped recently Constipation: slight worse since starting lenalidomide, reports using bisacodyl prn which helps No reported rash, edema, or nausea  Oral Chemotherapy Adherence: no missed dose reported No patient barriers to medication adherence identified.   New medications: None reported  Medication Access Issues: No issues, [facility]  Patient expressed understanding and was in agreement with this plan. She also understands that She can call clinic at any time with any questions, concerns, or complaints.   Follow-up plan: RTC in 2 weeks for labs/MD  Thank you for allowing me to participate in the care of this very pleasant patient.   Time Total: 15 mins  Visit consisted of counseling and education on dealing with issues of symptom management in the setting of serious and potentially life-threatening illness.Greater than 50%  of this time was spent counseling and coordinating care related to the above assessment and plan.  Signed by: ADarl Pikes PharmD, BCPS, BSalley Slaughter  CPP Hematology/Oncology Clinical Pharmacist Practitioner [place]/DB/AP [phone] 09/05/2022 2:19 PM

## 2022-09-08 NOTE — Progress Notes (Unsigned)
   [name], female    DOB: 1925-01-21, 88 y.o.   MRN: [id number]  HPI  Deanna White is a 88 y/o female with a history of  Echo report 07/21/22 showed an EF of 60-65% along with moderate LVH, severely elevate PA pressure of 64.0 mmHg, mild/moderate MR, moderate Deanna, moderate TR and mild/ moderate AS.   Admitted 08/03/22 due to worsening anemia and hypoxia. Unclear etiology.  Iron indices seem to indicate anemia of chronic disease. Transfused 1 unit PRBC on 11/4 and then again on 11/9. Cardiology, GI & oncology consults obtained. Initially given lasix but then it was stopped. Elevated troponin thought to be due to demand ischemia. Discharged after 8 days.   She presents today for her initial visit with a chief complaint of  Review of Systems    Physical Exam    Assessment & Plan:  1: Chronic heart failure with preserved ejection fraction with structural changes (LVH)- - NYHA class - on GDMT of  - BNP 08/03/22 was 708.7  2: HTN- - BP - saw PCP Deanna White) 08/21/22 - BMP 09/05/22 showed sodium 133, potassium 4.7, creatinine 1.2 & GFR 42  3: Multiple myeloma- - saw oncology Deanna White) 08/29/22 - hemoglobin 09/01/22 was 8.9  4: Aortic stenosis- - saw cardiology Deanna White) 08/22/22

## 2022-09-10 DIAGNOSIS — I509 Heart failure, unspecified: Secondary | ICD-10-CM | POA: Diagnosis not present

## 2022-09-11 ENCOUNTER — Ambulatory Visit: Payer: Medicare PPO | Attending: Family | Admitting: Family

## 2022-09-11 ENCOUNTER — Encounter (HOSPITAL_COMMUNITY): Payer: Self-pay | Admitting: Oncology

## 2022-09-11 ENCOUNTER — Other Ambulatory Visit: Payer: Self-pay | Admitting: Oncology

## 2022-09-11 ENCOUNTER — Encounter: Payer: Self-pay | Admitting: Family

## 2022-09-11 VITALS — BP 126/43 | HR 76 | Resp 16 | Wt 108.4 lb

## 2022-09-11 DIAGNOSIS — I10 Essential (primary) hypertension: Secondary | ICD-10-CM

## 2022-09-11 DIAGNOSIS — D638 Anemia in other chronic diseases classified elsewhere: Secondary | ICD-10-CM | POA: Insufficient documentation

## 2022-09-11 DIAGNOSIS — E079 Disorder of thyroid, unspecified: Secondary | ICD-10-CM | POA: Insufficient documentation

## 2022-09-11 DIAGNOSIS — I350 Nonrheumatic aortic (valve) stenosis: Secondary | ICD-10-CM | POA: Insufficient documentation

## 2022-09-11 DIAGNOSIS — C9000 Multiple myeloma not having achieved remission: Secondary | ICD-10-CM

## 2022-09-11 DIAGNOSIS — I110 Hypertensive heart disease with heart failure: Secondary | ICD-10-CM | POA: Insufficient documentation

## 2022-09-11 DIAGNOSIS — I5032 Chronic diastolic (congestive) heart failure: Secondary | ICD-10-CM | POA: Diagnosis not present

## 2022-09-11 DIAGNOSIS — I480 Paroxysmal atrial fibrillation: Secondary | ICD-10-CM

## 2022-09-11 NOTE — Patient Instructions (Signed)
Continue weighing daily and call for an overnight weight gain of 3 pounds or more or a weekly weight gain of more than 5 pounds.   If you have voicemail, please make sure your mailbox is cleaned out so that we may leave a message and please make sure to listen to any voicemails.     

## 2022-09-20 ENCOUNTER — Other Ambulatory Visit: Payer: Self-pay

## 2022-09-20 ENCOUNTER — Inpatient Hospital Stay: Payer: Medicare PPO

## 2022-09-20 ENCOUNTER — Inpatient Hospital Stay (HOSPITAL_BASED_OUTPATIENT_CLINIC_OR_DEPARTMENT_OTHER): Payer: Medicare PPO | Admitting: Oncology

## 2022-09-20 ENCOUNTER — Encounter: Payer: Self-pay | Admitting: Oncology

## 2022-09-20 ENCOUNTER — Other Ambulatory Visit: Payer: Self-pay | Admitting: Oncology

## 2022-09-20 VITALS — BP 129/56 | HR 75 | Temp 97.7°F | Resp 18 | Ht 62.0 in | Wt 107.0 lb

## 2022-09-20 DIAGNOSIS — E039 Hypothyroidism, unspecified: Secondary | ICD-10-CM | POA: Diagnosis not present

## 2022-09-20 DIAGNOSIS — I350 Nonrheumatic aortic (valve) stenosis: Secondary | ICD-10-CM | POA: Diagnosis not present

## 2022-09-20 DIAGNOSIS — I110 Hypertensive heart disease with heart failure: Secondary | ICD-10-CM | POA: Diagnosis not present

## 2022-09-20 DIAGNOSIS — C9000 Multiple myeloma not having achieved remission: Secondary | ICD-10-CM

## 2022-09-20 DIAGNOSIS — Z7189 Other specified counseling: Secondary | ICD-10-CM

## 2022-09-20 DIAGNOSIS — D649 Anemia, unspecified: Secondary | ICD-10-CM | POA: Diagnosis not present

## 2022-09-20 LAB — COMPREHENSIVE METABOLIC PANEL
ALT: 15 U/L (ref 0–44)
AST: 15 U/L (ref 15–41)
Albumin: 3.4 g/dL — ABNORMAL LOW (ref 3.5–5.0)
Alkaline Phosphatase: 74 U/L (ref 38–126)
Anion gap: 9 (ref 5–15)
BUN: 18 mg/dL (ref 8–23)
CO2: 21 mmol/L — ABNORMAL LOW (ref 22–32)
Calcium: 8.6 mg/dL — ABNORMAL LOW (ref 8.9–10.3)
Chloride: 99 mmol/L (ref 98–111)
Creatinine, Ser: 1.08 mg/dL — ABNORMAL HIGH (ref 0.44–1.00)
GFR, Estimated: 47 mL/min — ABNORMAL LOW (ref >60)
Glucose, Bld: 149 mg/dL — ABNORMAL HIGH (ref 70–99)
Potassium: 4.8 mmol/L (ref 3.5–5.1)
Sodium: 129 mmol/L — ABNORMAL LOW (ref 135–145)
Total Bilirubin: 0.3 mg/dL (ref 0.3–1.2)
Total Protein: 5.8 g/dL — ABNORMAL LOW (ref 6.5–8.1)

## 2022-09-20 LAB — CBC WITH DIFFERENTIAL/PLATELET
Abs Immature Granulocytes: 0.04 10*3/uL (ref 0.00–0.07)
Basophils Absolute: 0 10*3/uL (ref 0.0–0.1)
Basophils Relative: 0 %
Eosinophils Absolute: 0 10*3/uL (ref 0.0–0.5)
Eosinophils Relative: 1 %
HCT: 24.5 % — ABNORMAL LOW (ref 36.0–46.0)
Hemoglobin: 8.3 g/dL — ABNORMAL LOW (ref 12.0–15.0)
Immature Granulocytes: 1 %
Lymphocytes Relative: 19 %
Lymphs Abs: 0.5 10*3/uL — ABNORMAL LOW (ref 0.7–4.0)
MCH: 33.1 pg (ref 26.0–34.0)
MCHC: 33.9 g/dL (ref 30.0–36.0)
MCV: 97.6 fL (ref 80.0–100.0)
Monocytes Absolute: 0 10*3/uL — ABNORMAL LOW (ref 0.1–1.0)
Monocytes Relative: 1 %
Neutro Abs: 2.1 10*3/uL (ref 1.7–7.7)
Neutrophils Relative %: 78 %
Platelets: 105 10*3/uL — ABNORMAL LOW (ref 150–400)
RBC: 2.51 MIL/uL — ABNORMAL LOW (ref 3.87–5.11)
RDW: 16.8 % — ABNORMAL HIGH (ref 11.5–15.5)
WBC: 2.8 10*3/uL — ABNORMAL LOW (ref 4.0–10.5)
nRBC: 0 % (ref 0.0–0.2)

## 2022-09-20 MED ORDER — DEXAMETHASONE 4 MG PO TABS: 20.0000 mg | ORAL_TABLET | ORAL | 2 refills | Status: AC

## 2022-09-21 LAB — KAPPA/LAMBDA LIGHT CHAINS
Kappa, lambda light chain ratio: 2672.35 — ABNORMAL HIGH (ref 0.26–1.65)
Lambda free light chains: 9.3 mg/L (ref 5.7–26.3)

## 2022-09-25 DIAGNOSIS — C9000 Multiple myeloma not having achieved remission: Secondary | ICD-10-CM | POA: Insufficient documentation

## 2022-09-25 NOTE — Progress Notes (Signed)
Hematology/Oncology Consult note [facility]  [phone] Fax:([phone]  Patient Care Team: Crecencio Mc, MD as PCP - General (Internal Medicine) Minna Merritts, MD as PCP - Cardiology (Cardiology) Corey Skains, MD as Consulting Physician (Cardiology)   Name of the patient: Deanna White  [id number]  07-Dec-1924   Date of visit: 09/25/22  Diagnosis- kappa light chain multiple myeloma  Chief complaint/ Reason for visit- Discuss bone marrow biopsy results and further management  Heme/Onc history: patient is a 88 old female with a past medical history significant for hypertension hypothyroidism And his history of aortic stenosis mild to moderate.  She was admitted to the hospital for shortness of breath with exertion and fatigue.  Labs reveal sudden worsening of anemia.  At baseline patient's hemoglobin up until January 2023 was noted to be 12.7.  It dropped down to 11.1 in March 2023 followed by 8.1 on 07/28/2022 and presently is between 7.5-8.  White cell count has been normal with a normal differential between 4-6.  Platelet count was mildly low at 140 in January 2023 and presently at 125.     Iron studies were checked which on 08/02/2022 which showed a low transferrin of 170.  Ferritin elevated at 763 and TIBC low at 238.  EPO level was 166.  B12 levels were elevated at greater than 1500.  CMP shows low total protein of 6.4.  Creatinine mildly elevated at 1.4.  Serum calcium was normal.  Reticulocyte count low for the degree of anemia at 2.  Smear review was otherwise unremarkable other than mixed RBC population.  LDH mildly elevated at 261.  Stool occult was positive and therefore GI was consulted although there has been no evidence of overt GI bleed.    Myeloma panel showed 0.4 g of monoclonal free kappa light chain protein free light chain ratio was 6536.     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  PET CT scan did not show  any bony lesions.    Bone marrow biopsy showed atypical plasma cells representing 78% of all cells in the aspirate.  Plasma cells display kappa light chain restriction consistent with plasma cell neoplasm.  Ring sideroblasts absent.  Cytogenetics normal.   Interval history-patient is tolerating Revlimid well without any skin rash or diarrhea.  She has mild ongoing fatigue.  ECOG PS- 2 Pain scale- 0   Review of systems- Review of Systems  Constitutional:  Positive for malaise/fatigue. Negative for chills, fever and weight loss.  HENT:  Negative for congestion, ear discharge and nosebleeds.   Eyes:  Negative for blurred vision.  Respiratory:  Negative for cough, hemoptysis, sputum production, shortness of breath and wheezing.   Cardiovascular:  Negative for chest pain, palpitations, orthopnea and claudication.  Gastrointestinal:  Negative for abdominal pain, blood in stool, constipation, diarrhea, heartburn, melena, nausea and vomiting.  Genitourinary:  Negative for dysuria, flank pain, frequency, hematuria and urgency.  Musculoskeletal:  Negative for back pain, joint pain and myalgias.  Skin:  Negative for rash.  Neurological:  Negative for dizziness, tingling, focal weakness, seizures, weakness and headaches.  Endo/Heme/Allergies:  Does not bruise/bleed easily.  Psychiatric/Behavioral:  Negative for depression and suicidal ideas. The patient does not have insomnia.       Allergies  Allergen Reactions   Hctz [Hydrochlorothiazide] Other (See Comments)    Hyponatremia    Adhesive [Tape] Rash    Peels skin   Brimonidine Rash and Other (See Comments)  Pt felt as if throat was closing up, pt also had rash     Past Medical History:  Diagnosis Date   Actinic keratosis    Allergy    Anemia 02/25/2015   Aortic stenosis    Arrhythmia    atrial fibrillation/ SVT   Basal cell carcinoma 06/01/2021   R temple - ED&C   Carotid artery occlusion    CHF (congestive heart failure)  (HCC)    Colon polyps    Fall from slipping on ice 11/2013   Heart murmur    Hypertension    Menopause    Multiple myeloma ([place])    PAD (peripheral artery disease) ([place])    Pneumonia    Positive TB test    pt denies   Sore throat    Squamous cell carcinoma of skin 04/17/2019   L post lateral ankle, shave removal 10/15/2019   Thyroid disease    Varicose veins      Past Surgical History:  Procedure Laterality Date   ABDOMINAL HYSTERECTOMY     APPENDECTOMY     BILATERAL SALPINGOOPHORECTOMY  1964   CESAREAN SECTION     EYE SURGERY Right March 2016   Cataract   LOWER EXTREMITY ANGIOGRAPHY Left 03/14/2019   Procedure: LOWER EXTREMITY ANGIOGRAPHY;  Surgeon: Katha Cabal, MD;  Location: [place] [facility];  Service: Cardiovascular;  Laterality: Left;   TONSILLECTOMY     VEIN SURGERY Left    Leg    Social History   Socioeconomic History   Marital status: Widowed    Spouse name: Not on file   Number of children: Not on file   Years of education: Not on file   Highest education level: Not on file  Occupational History   Not on file  Tobacco Use   Smoking status: Never   Smokeless tobacco: Never  Vaping Use   Vaping Use: Never used  Substance and Sexual Activity   Alcohol use: No   Drug use: No   Sexual activity: Never  Other Topics Concern   Not on file  Social History Narrative   Not on file   Social Determinants of Health   Financial Resource Strain: Low Risk  (09/27/2020)   Overall Financial Resource Strain (CARDIA)    Difficulty of Paying Living Expenses: Not hard at all  Food Insecurity: No Food Insecurity (09/27/2020)   Hunger Vital Sign    Worried About Running Out of Food in the Last Year: Never true    [place] in the Last Year: Never true  Transportation Needs: No Transportation Needs (09/27/2020)   PRAPARE - Hydrologist (Medical): No    Lack of Transportation (Non-Medical): No  Physical Activity: Not on  file  Stress: No Stress Concern Present (09/27/2020)   [place]    Feeling of Stress : Not at all  Social Connections: Unknown (09/27/2020)   Social Connection and Isolation Panel [NHANES]    Frequency of Communication with Friends and Family: More than three times a week    Frequency of Social Gatherings with Friends and Family: More than three times a week    Attends Religious Services: Not on file    Active Member of Clubs or Organizations: Not on file    Attends Archivist Meetings: Not on file    Marital Status: Not on file  Intimate Partner Violence: Not At Risk (09/27/2020)   Humiliation, Afraid,  Rape, and Kick questionnaire    Fear of Current or Ex-Partner: No    Emotionally Abused: No    Physically Abused: No    Sexually Abused: No    Family History  Problem Relation Age of Onset   Heart disease Mother        After age 14   Hypertension Mother    Deep vein thrombosis Mother    Heart attack Mother    Kidney disease Father    Alcohol abuse Brother    Heart disease Brother    Cancer Brother        Lung cancer   Cancer Brother        kidney cancer   Varicose Veins Sister    Heart disease Sister        After age 21   Heart attack Sister    Heart disease Sister        After age 72   Other Other        epilepsy   Hypertension Other    Cancer Paternal Grandmother        breast cancer   Cancer Daughter      Current Outpatient Medications:    amLODipine (NORVASC) 5 MG tablet, Take 0.5 tab by mouth every AM, and take 1 tab  by mouth every PM, Disp: 60 tablet, Rfl: 3   ASPIRIN 81 PO, Take 81 mg by mouth daily., Disp: , Rfl:    cloNIDine (CATAPRES) 0.1 MG tablet, 1 tablet in the am and 1 In the evening, Disp: 270 tablet, Rfl: 2   dorzolamide (TRUSOPT) 2 % ophthalmic solution, Place 1 drop into both eyes 3 (three) times daily. , Disp: , Rfl: 12   furosemide (LASIX) 20 MG tablet, Take 1  tablet (20 mg total) by mouth every other day., Disp: 64 tablet, Rfl: 1   ketoconazole (NIZORAL) 2 % cream, Apply 1 Application topically at bedtime., Disp: 60 g, Rfl: 11   lenalidomide (REVLIMID) 10 MG capsule, Take 1 capsule (10 mg total) by mouth daily. 10 mg revlimid  daily for 14 days and then 7 days off. REMs # [id number] obtained on 09/12/2022, Disp: 14 capsule, Rfl: 1   levothyroxine (SYNTHROID) 75 MCG tablet, TAKE 1 TABLET BY MOUTH EVERY DAY, Disp: 90 tablet, Rfl: 1   losartan (COZAAR) 100 MG tablet, TAKE 1 TABLET BY MOUTH EVERY DAY, Disp: 90 tablet, Rfl: 1   metoprolol tartrate (LOPRESSOR) 25 MG tablet, Take 0.5 tablets (12.5 mg total) by mouth 2 (two) times daily., Disp: 30 tablet, Rfl: 1   Multiple Vitamins-Minerals (PRESERVISION AREDS 2) CAPS, Take 1 capsule by mouth 2 (two) times daily. , Disp: , Rfl:    potassium chloride (KLOR-CON) 10 MEQ tablet, Take 2 tablets (20 mEq total) by mouth every other day., Disp: 128 tablet, Rfl: 1   allopurinol (ZYLOPRIM) 300 MG tablet, TAKE 1 TABLET BY MOUTH EVERY DAY, Disp: 90 tablet, Rfl: 1   dexamethasone (DECADRON) 4 MG tablet, Take 5 tablets (20 mg total) by mouth once a week. Take with food., Disp: 20 tablet, Rfl: 2   NEEDLE, DISP, 25 G 25G X 1" MISC, Use to give b12 injection once monthly. (Patient not taking: Reported on 08/22/2022), Disp: 50 each, Rfl: 0  Physical exam:  Vitals:   09/20/22 1358  BP: (!) 129/56  Pulse: 75  Resp: 18  Temp: 97.7 F (36.5 C)  TempSrc: Tympanic  SpO2: 99%  Weight: 107 lb (48.5 kg)  Height: _0  (1.575 m)  Physical Exam Constitutional:      Comments: Sitting in a wheelchair. Appears in no acute distress  Cardiovascular:     Rate and Rhythm: Normal rate and regular rhythm.     Heart sounds: Normal heart sounds.  Pulmonary:     Effort: Pulmonary effort is normal.     Breath sounds: Normal breath sounds.  Musculoskeletal:     Comments: B/l +1 edema  Skin:    General: Skin is warm and dry.   Neurological:     Mental Status: She is alert and oriented to person, place, and time.         Latest Ref Rng & Units 09/20/2022    1:28 PM  CMP  Glucose 70 - 99 mg/dL 149   BUN 8 - 23 mg/dL 18   Creatinine 0.44 - 1.00 mg/dL 1.08   Sodium 135 - 145 mmol/L 129   Potassium 3.5 - 5.1 mmol/L 4.8   Chloride 98 - 111 mmol/L 99   CO2 22 - 32 mmol/L 21   Calcium 8.9 - 10.3 mg/dL 8.6   Total Protein 6.5 - 8.1 g/dL 5.8   Total Bilirubin 0.3 - 1.2 mg/dL 0.3   Alkaline Phos 38 - 126 U/L 74   AST 15 - 41 U/L 15   ALT 0 - 44 U/L 15       Latest Ref Rng & Units 09/20/2022    1:28 PM  CBC  WBC 4.0 - 10.5 K/uL 2.8   Hemoglobin 12.0 - 15.0 g/dL 8.3   Hematocrit 36.0 - 46.0 % 24.5   Platelets 150 - 400 K/uL 105     No images are attached to the encounter.  CT BONE MARROW BIOPSY & ASPIRATION  Result Date: 09/01/2022 INDICATION: Abnormal myeloma panel EXAM: CT BONE MARROW BIOPSY AND ASPIRATION MEDICATIONS: None. ANESTHESIA/SEDATION: Moderate (conscious) sedation was employed during this procedure. A total of Versed 0.5 mg and Fentanyl 25 mcg was administered intravenously. Moderate Sedation Time: 10 minutes. The patient's level of consciousness and vital signs were monitored continuously by radiology nursing throughout the procedure under my direct supervision. FLUOROSCOPY TIME:  N/a COMPLICATIONS: None immediate. PROCEDURE: Informed written consent was obtained from the patient after a thorough discussion of the procedural risks, benefits and alternatives. All questions were addressed. Maximal Sterile Barrier Technique was utilized including caps, mask, sterile gowns, sterile gloves, sterile drape, hand hygiene and skin antiseptic. A timeout was performed prior to the initiation of the procedure. The patient was placed prone on the CT exam table. Limited CT of the pelvis was performed for planning purposes. Skin entry site was marked, and the overlying skin was prepped and draped in the standard  sterile fashion. Local analgesia was obtained with 1% lidocaine. Using CT guidance, an 11 gauge needle was advanced just deep to the cortex of the right posterior ilium. Subsequently, bone marrow aspiration and core biopsy were performed. Specimens were submitted to lab/pathology for handling. Hemostasis was achieved with manual pressure, and a clean dressing was placed. The patient tolerated the procedure well without immediate complication. IMPRESSION: Successful CT-guided bone marrow aspiration and core biopsy of the right posterior ilium. Electronically Signed   By: Albin Felling M.D.   On: 09/01/2022 10:18     Assessment and plan- Patient is a 88 y.o. female here for high risk kappa light chain multiple myeloma here to discuss bone marrow biopsy results and further management  Patient falls in the high risk group mainly because of her elevated LDH and  beta-2 microglobulin.  Otherwise her cytogenetics were normal.She is presently on revlimid 10 mg 2 weeks on and 1 week off which she is tolerating well without any significant side effects.  Her hemoglobin is currently low at 8.3 likely secondary to her underlying myeloma.  White cell count is 2.8 today but neutrophils remain more than 2.1.  I am planning to continue single agent Revlimid for about 2 cycles before I add Darzalex especially given her age.  I will repeat her myeloma panel free light chains CBC CMP in 3 weeks and see her thereafter.  We did repeat her light chain ratio today which has come down from 6536 to 2672 indicating response to treatment.  Patient does have some mild bilateral lower extremity edema which I suspect is secondary to her weekly Decadron 20 mg.  Have asked her to cut it down from 20 mg to 12 mg weekly.   Visit Diagnosis 1. Light chain myeloma (HCC)   2. Multiple myeloma not having achieved remission ([place])      Dr. Randa Evens, MD, MPH [facility] [phone] 09/25/2022 1:01  PM

## 2022-09-26 LAB — MULTIPLE MYELOMA PANEL, SERUM
Albumin SerPl Elph-Mcnc: 3 g/dL (ref 2.9–4.4)
Albumin/Glob SerPl: 1.4 (ref 0.7–1.7)
Alpha 1: 0.4 g/dL (ref 0.0–0.4)
Alpha2 Glob SerPl Elph-Mcnc: 0.7 g/dL (ref 0.4–1.0)
B-Globulin SerPl Elph-Mcnc: 0.7 g/dL (ref 0.7–1.3)
Gamma Glob SerPl Elph-Mcnc: 0.4 g/dL (ref 0.4–1.8)
Globulin, Total: 2.3 g/dL (ref 2.2–3.9)
IgA: 23 mg/dL — ABNORMAL LOW (ref 64–422)
IgG (Immunoglobin G), Serum: 373 mg/dL — ABNORMAL LOW (ref 586–1602)
IgM (Immunoglobulin M), Srm: 39 mg/dL (ref 26–217)
Total Protein ELP: 5.3 g/dL — ABNORMAL LOW (ref 6.0–8.5)

## 2022-09-27 ENCOUNTER — Other Ambulatory Visit: Payer: Self-pay | Admitting: Oncology

## 2022-09-27 DIAGNOSIS — C9000 Multiple myeloma not having achieved remission: Secondary | ICD-10-CM

## 2022-10-03 ENCOUNTER — Telehealth: Payer: Self-pay | Admitting: Pharmacist

## 2022-10-03 NOTE — Telephone Encounter (Signed)
Oral Chemotherapy Pharmacist Encounter   Patient's daughter Helene Kelp called and LVM to report that her mom was having some skin redness and fatigue.   Called returned to [place] who was with her mother and put me on speaker. Ms. Latosha Gaylord reports red quarter sized blotches on her upper body. She reports itching only in her neck area. She is unable to answer whether that come and go or are consistently there. This started last week.   Ms. Besse has also reported an increase in fatigue over the last week. She has two more doses of Revlimid for this cycle (including today), then she will have a week off.  Shared the above information with Dr. Janese Banks, she would like for Ms. Holzman to complete her final two doses of this cycle but begin taking antihistamine daily. She is scheduled to RTC on 1/10.   Returned call to Helene Kelp and instructed her to have her mom continue with her last two doses of Revlimid and start taking loratadine '10mg'$  daily. She knows to call and report if her mother is doing worse or she feels like she needs to come in to be seen.    Darl Pikes, PharmD, BCPS, BCOP, CPP Hematology/Oncology Clinical Pharmacist [place]/DB/AP [facility] [phone]  10/03/2022 1:35 PM

## 2022-10-06 ENCOUNTER — Other Ambulatory Visit: Payer: Medicare PPO

## 2022-10-09 ENCOUNTER — Ambulatory Visit: Payer: Medicare PPO | Admitting: Oncology

## 2022-10-09 ENCOUNTER — Ambulatory Visit: Payer: Medicare PPO

## 2022-10-10 ENCOUNTER — Encounter (INDEPENDENT_AMBULATORY_CARE_PROVIDER_SITE_OTHER): Payer: Medicare PPO

## 2022-10-10 ENCOUNTER — Ambulatory Visit (INDEPENDENT_AMBULATORY_CARE_PROVIDER_SITE_OTHER): Payer: Medicare PPO | Admitting: Nurse Practitioner

## 2022-10-11 ENCOUNTER — Other Ambulatory Visit: Payer: Self-pay

## 2022-10-11 ENCOUNTER — Inpatient Hospital Stay (HOSPITAL_BASED_OUTPATIENT_CLINIC_OR_DEPARTMENT_OTHER): Payer: Medicare PPO | Admitting: Oncology

## 2022-10-11 ENCOUNTER — Other Ambulatory Visit: Payer: Self-pay | Admitting: *Deleted

## 2022-10-11 ENCOUNTER — Encounter: Payer: Self-pay | Admitting: Oncology

## 2022-10-11 ENCOUNTER — Inpatient Hospital Stay: Payer: Medicare PPO | Attending: Oncology

## 2022-10-11 VITALS — BP 116/56 | HR 76 | Temp 96.3°F | Resp 20 | Wt 112.6 lb

## 2022-10-11 DIAGNOSIS — L270 Generalized skin eruption due to drugs and medicaments taken internally: Secondary | ICD-10-CM | POA: Diagnosis not present

## 2022-10-11 DIAGNOSIS — D702 Other drug-induced agranulocytosis: Secondary | ICD-10-CM

## 2022-10-11 DIAGNOSIS — C9000 Multiple myeloma not having achieved remission: Secondary | ICD-10-CM

## 2022-10-11 DIAGNOSIS — E039 Hypothyroidism, unspecified: Secondary | ICD-10-CM | POA: Diagnosis not present

## 2022-10-11 DIAGNOSIS — I509 Heart failure, unspecified: Secondary | ICD-10-CM | POA: Diagnosis not present

## 2022-10-11 DIAGNOSIS — D649 Anemia, unspecified: Secondary | ICD-10-CM | POA: Insufficient documentation

## 2022-10-11 LAB — CBC WITH DIFFERENTIAL/PLATELET
Abs Immature Granulocytes: 0 10*3/uL (ref 0.00–0.07)
Basophils Absolute: 0 10*3/uL (ref 0.0–0.1)
Basophils Relative: 0 %
Eosinophils Absolute: 0.1 10*3/uL (ref 0.0–0.5)
Eosinophils Relative: 27 %
HCT: 21.3 % — ABNORMAL LOW (ref 36.0–46.0)
Hemoglobin: 7 g/dL — ABNORMAL LOW (ref 12.0–15.0)
Immature Granulocytes: 0 %
Lymphocytes Relative: 31 %
Lymphs Abs: 0.2 10*3/uL — ABNORMAL LOW (ref 0.7–4.0)
MCH: 33.8 pg (ref 26.0–34.0)
MCHC: 32.9 g/dL (ref 30.0–36.0)
MCV: 102.9 fL — ABNORMAL HIGH (ref 80.0–100.0)
Monocytes Absolute: 0 10*3/uL — ABNORMAL LOW (ref 0.1–1.0)
Monocytes Relative: 2 %
Neutro Abs: 0.2 10*3/uL — CL (ref 1.7–7.7)
Neutrophils Relative %: 40 %
Platelets: 60 10*3/uL — ABNORMAL LOW (ref 150–400)
RBC: 2.07 MIL/uL — ABNORMAL LOW (ref 3.87–5.11)
RDW: 19.4 % — ABNORMAL HIGH (ref 11.5–15.5)
WBC: 0.5 10*3/uL — CL (ref 4.0–10.5)
nRBC: 0 % (ref 0.0–0.2)

## 2022-10-11 LAB — COMPREHENSIVE METABOLIC PANEL
ALT: 11 U/L (ref 0–44)
AST: 20 U/L (ref 15–41)
Albumin: 2.8 g/dL — ABNORMAL LOW (ref 3.5–5.0)
Alkaline Phosphatase: 86 U/L (ref 38–126)
Anion gap: 8 (ref 5–15)
BUN: 22 mg/dL (ref 8–23)
CO2: 22 mmol/L (ref 22–32)
Calcium: 8.2 mg/dL — ABNORMAL LOW (ref 8.9–10.3)
Chloride: 98 mmol/L (ref 98–111)
Creatinine, Ser: 1.09 mg/dL — ABNORMAL HIGH (ref 0.44–1.00)
GFR, Estimated: 47 mL/min — ABNORMAL LOW (ref >60)
Glucose, Bld: 136 mg/dL — ABNORMAL HIGH (ref 70–99)
Potassium: 4.6 mmol/L (ref 3.5–5.1)
Sodium: 128 mmol/L — ABNORMAL LOW (ref 135–145)
Total Bilirubin: 0.2 mg/dL — ABNORMAL LOW (ref 0.3–1.2)
Total Protein: 5.1 g/dL — ABNORMAL LOW (ref 6.5–8.1)

## 2022-10-11 LAB — PREPARE RBC (CROSSMATCH)

## 2022-10-11 MED ORDER — AMOXICILLIN-POT CLAVULANATE 875-125 MG PO TABS: 1.0000 | ORAL_TABLET | Freq: Two times a day (BID) | ORAL | 0 refills | Status: DC

## 2022-10-11 MED ORDER — LENALIDOMIDE 2.5 MG PO CAPS: 2.5000 mg | ORAL_CAPSULE | Freq: Every day | ORAL | 0 refills | Status: DC

## 2022-10-11 NOTE — Progress Notes (Signed)
Pt is SOB during exam. Pt wants to discuss which medication she is on that she is allergic too. She has a rash that started in her face and working down to her legs. Possibly has been 2 weeks. Pt is taking OTC Claritin. She has red blanches on her body. RN will make MD aware.

## 2022-10-11 NOTE — Progress Notes (Signed)
Dr. Janese Banks is decreasing lenalidomide dose due to neutropenia and fatigue. Patient to hold therapy for 2 weeks. Dose reduced prescription sent to [facility].   Laray Anger, PharmD PGY-2 Pharmacy Resident Hematology/Oncology [phone]  10/11/2022 3:49 PM

## 2022-10-12 ENCOUNTER — Inpatient Hospital Stay: Payer: Medicare PPO

## 2022-10-12 ENCOUNTER — Telehealth: Payer: Self-pay | Admitting: Cardiovascular Disease

## 2022-10-12 ENCOUNTER — Encounter: Payer: Self-pay | Admitting: Oncology

## 2022-10-12 DIAGNOSIS — E039 Hypothyroidism, unspecified: Secondary | ICD-10-CM | POA: Diagnosis not present

## 2022-10-12 DIAGNOSIS — C9000 Multiple myeloma not having achieved remission: Secondary | ICD-10-CM | POA: Diagnosis not present

## 2022-10-12 DIAGNOSIS — D649 Anemia, unspecified: Secondary | ICD-10-CM

## 2022-10-12 LAB — KAPPA/LAMBDA LIGHT CHAINS
Kappa, lambda light chain ratio: 777.13 — ABNORMAL HIGH (ref 0.26–1.65)
Lambda free light chains: 16.5 mg/L (ref 5.7–26.3)

## 2022-10-12 MED ORDER — SODIUM CHLORIDE 0.9% IV SOLUTION
250.0000 mL | Freq: Once | INTRAVENOUS | Status: DC
  Filled 2022-10-12: qty 250

## 2022-10-12 MED ORDER — ACETAMINOPHEN 325 MG PO TABS
650.0000 mg | ORAL_TABLET | Freq: Once | ORAL | Status: AC
  Administered 2022-10-12: 650 mg via ORAL
  Filled 2022-10-12: qty 2

## 2022-10-12 NOTE — Progress Notes (Signed)
Hematology/Oncology Consult note [facility]  [phone] [phone]  Patient Care Team: Crecencio Mc, MD as PCP - General (Internal Medicine) Minna Merritts, MD as PCP - Cardiology (Cardiology)   Name of the patient: Deanna White  [id number]  1924/11/04   Date of visit: 10/12/22  Diagnosis- kappa light chain multiple myeloma   Chief complaint/ Reason for visit- routine f/u of myeloma on revlimid  Heme/Onc history: patient is a 89 old female with a past medical history significant for hypertension hypothyroidism And his history of aortic stenosis mild to moderate.  She was admitted to the hospital for shortness of breath with exertion and fatigue.  Labs reveal sudden worsening of anemia.  At baseline patient's hemoglobin up until January 2023 was noted to be 12.7.  It dropped down to 11.1 in March 2023 followed by 8.1 on 07/28/2022 and presently is between 7.5-8.  White cell count has been normal with a normal differential between 4-6.  Platelet count was mildly low at 140 in January 2023 and presently at 125.     Iron studies were checked which on 08/02/2022 which showed a low transferrin of 170.  Ferritin elevated at 763 and TIBC low at 238.  EPO level was 166.  B12 levels were elevated at greater than 1500.  CMP shows low total protein of 6.4.  Creatinine mildly elevated at 1.4.  Serum calcium was normal.  Reticulocyte count low for the degree of anemia at 2.  Smear review was otherwise unremarkable other than mixed RBC population.  LDH mildly elevated at 261.  Stool occult was positive and therefore GI was consulted although there has been no evidence of overt GI bleed.    Myeloma panel showed 0.4 g of monoclonal free kappa light chain protein free light chain ratio was 6536.     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  PET CT scan did not show any bony lesions.     Bone marrow biopsy showed atypical plasma cells  representing 78% of all cells in the aspirate.  Plasma cells display kappa light chain restriction consistent with plasma cell neoplasm.  Ring sideroblasts absent.  Cytogenetics normal.    Interval history- Patient has been feeling quite fatigued over the last 1 to 2 weeks.  She has been having generalized itching as well as erythematous rash mainly over her arms and legs which is gradually improving.  She has been using as needed loratadine for the same.  Denies any fever.  ECOG PS- 2-3 Pain scale- 0   Review of systems- Review of Systems  Constitutional:  Positive for malaise/fatigue. Negative for chills, fever and weight loss.  HENT:  Negative for congestion, ear discharge and nosebleeds.   Eyes:  Negative for blurred vision.  Respiratory:  Negative for cough, hemoptysis, sputum production, shortness of breath and wheezing.   Cardiovascular:  Negative for chest pain, palpitations, orthopnea and claudication.  Gastrointestinal:  Negative for abdominal pain, blood in stool, constipation, diarrhea, heartburn, melena, nausea and vomiting.  Genitourinary:  Negative for dysuria, flank pain, frequency, hematuria and urgency.  Musculoskeletal:  Negative for back pain, joint pain and myalgias.  Skin:  Positive for rash.  Neurological:  Negative for dizziness, tingling, focal weakness, seizures, weakness and headaches.  Endo/Heme/Allergies:  Does not bruise/bleed easily.  Psychiatric/Behavioral:  Negative for depression and suicidal ideas. The patient does not have insomnia.       Allergies  Allergen Reactions   Hctz [Hydrochlorothiazide] Other (  See Comments)    Hyponatremia    Adhesive [Tape] Rash    Peels skin   Brimonidine Rash and Other (See Comments)    Pt felt as if throat was closing up, pt also had rash     Past Medical History:  Diagnosis Date   Actinic keratosis    Allergy    Anemia 02/25/2015   Aortic stenosis    Arrhythmia    atrial fibrillation/ SVT   Basal cell  carcinoma 06/01/2021   R temple - ED&C   Carotid artery occlusion    CHF (congestive heart failure) (HCC)    Colon polyps    Fall from slipping on ice 11/2013   Heart murmur    Hypertension    Menopause    Multiple myeloma ([place])    PAD (peripheral artery disease) ([place])    Pneumonia    Positive TB test    pt denies   Sore throat    Squamous cell carcinoma of skin 04/17/2019   L post lateral ankle, shave removal 10/15/2019   Thyroid disease    Varicose veins      Past Surgical History:  Procedure Laterality Date   ABDOMINAL HYSTERECTOMY     APPENDECTOMY     BILATERAL SALPINGOOPHORECTOMY  1964   CESAREAN SECTION     EYE SURGERY Right March 2016   Cataract   LOWER EXTREMITY ANGIOGRAPHY Left 03/14/2019   Procedure: LOWER EXTREMITY ANGIOGRAPHY;  Surgeon: Katha Cabal, MD;  Location: [facility];  Service: Cardiovascular;  Laterality: Left;   TONSILLECTOMY     VEIN SURGERY Left    Leg    Social History   Socioeconomic History   Marital status: Widowed    Spouse name: Not on file   Number of children: Not on file   Years of education: Not on file   Highest education level: Not on file  Occupational History   Not on file  Tobacco Use   Smoking status: Never   Smokeless tobacco: Never  Vaping Use   Vaping Use: Never used  Substance and Sexual Activity   Alcohol use: No   Drug use: No   Sexual activity: Never  Other Topics Concern   Not on file  Social History Narrative   Not on file   Social Determinants of Health   Financial Resource Strain: Low Risk  (09/27/2020)   Overall Financial Resource Strain (CARDIA)    Difficulty of Paying Living Expenses: Not hard at all  Food Insecurity: No Food Insecurity (09/27/2020)   Hunger Vital Sign    Worried About Running Out of Food in the Last Year: Never true    [place] in the Last Year: Never true  Transportation Needs: No Transportation Needs (09/27/2020)   PRAPARE - Radiographer, therapeutic (Medical): No    Lack of Transportation (Non-Medical): No  Physical Activity: Not on file  Stress: No Stress Concern Present (09/27/2020)   [place]    Feeling of Stress : Not at all  Social Connections: Unknown (09/27/2020)   Social Connection and Isolation Panel [NHANES]    Frequency of Communication with Friends and Family: More than three times a week    Frequency of Social Gatherings with Friends and Family: More than three times a week    Attends Religious Services: Not on file    Active Member of Clubs or Organizations: Not on file  Attends Archivist Meetings: Not on file    Marital Status: Not on file  Intimate Partner Violence: Not At Risk (09/27/2020)   Humiliation, Afraid, Rape, and Kick questionnaire    Fear of Current or Ex-Partner: No    Emotionally Abused: No    Physically Abused: No    Sexually Abused: No    Family History  Problem Relation Age of Onset   Heart disease Mother        After age 93   Hypertension Mother    Deep vein thrombosis Mother    Heart attack Mother    Kidney disease Father    Alcohol abuse Brother    Heart disease Brother    Cancer Brother        Lung cancer   Cancer Brother        kidney cancer   Varicose Veins Sister    Heart disease Sister        After age 22   Heart attack Sister    Heart disease Sister        After age 69   Other Other        epilepsy   Hypertension Other    Cancer Paternal Grandmother        breast cancer   Cancer Daughter      Current Outpatient Medications:    allopurinol (ZYLOPRIM) 300 MG tablet, TAKE 1 TABLET BY MOUTH EVERY DAY, Disp: 90 tablet, Rfl: 1   amLODipine (NORVASC) 5 MG tablet, Take 0.5 tab by mouth every AM, and take 1 tab  by mouth every PM, Disp: 60 tablet, Rfl: 3   ASPIRIN 81 PO, Take 81 mg by mouth daily., Disp: , Rfl:    cloNIDine (CATAPRES) 0.1 MG tablet, 1 tablet in the am and 1 In the  evening, Disp: 270 tablet, Rfl: 2   dexamethasone (DECADRON) 4 MG tablet, Take 5 tablets (20 mg total) by mouth once a week. Take with food., Disp: 20 tablet, Rfl: 2   dorzolamide (TRUSOPT) 2 % ophthalmic solution, Place 1 drop into both eyes 3 (three) times daily. , Disp: , Rfl: 12   furosemide (LASIX) 20 MG tablet, Take 1 tablet (20 mg total) by mouth every other day., Disp: 64 tablet, Rfl: 1   ketoconazole (NIZORAL) 2 % cream, Apply 1 Application topically at bedtime., Disp: 60 g, Rfl: 11   levothyroxine (SYNTHROID) 75 MCG tablet, TAKE 1 TABLET BY MOUTH EVERY DAY, Disp: 90 tablet, Rfl: 1   loratadine (CLARITIN) 10 MG tablet, Take 10 mg by mouth at bedtime as needed for allergies (For the rash)., Disp: , Rfl:    losartan (COZAAR) 100 MG tablet, TAKE 1 TABLET BY MOUTH EVERY DAY, Disp: 90 tablet, Rfl: 1   metoprolol tartrate (LOPRESSOR) 25 MG tablet, Take 0.5 tablets (12.5 mg total) by mouth 2 (two) times daily., Disp: 30 tablet, Rfl: 1   Multiple Vitamins-Minerals (PRESERVISION AREDS 2) CAPS, Take 1 capsule by mouth 2 (two) times daily. , Disp: , Rfl:    potassium chloride (KLOR-CON) 10 MEQ tablet, Take 2 tablets (20 mEq total) by mouth every other day., Disp: 128 tablet, Rfl: 1   amoxicillin-clavulanate (AUGMENTIN) 875-125 MG tablet, Take 1 tablet by mouth 2 (two) times daily., Disp: 14 tablet, Rfl: 0   lenalidomide (REVLIMID) 2.5 MG capsule, Take 1 capsule (2.5 mg total) by mouth daily. Take for 14 days, then hold for 7 days. Repeat every 21 days., Disp: 14 capsule, Rfl: 0   NEEDLE,  DISP, 25 G 25G X 1" MISC, Use to give b12 injection once monthly. (Patient not taking: Reported on 08/22/2022), Disp: 50 each, Rfl: 0 No current facility-administered medications for this visit.  Facility-Administered Medications Ordered in Other Visits:    0.9 %  sodium chloride infusion (Manually program via Guardrails IV Fluids), 250 mL, Intravenous, Once, Sindy Guadeloupe, MD  Physical exam:  Vitals:   10/11/22  1328  BP: (!) 116/56  Pulse: 76  Resp: 20  Temp: (!) 96.3 F (35.7 C)  TempSrc: Tympanic  SpO2: 95%  Weight: 112 lb 9.6 oz (51.1 kg)   Physical Exam Constitutional:      Comments: Thin elderly frail woman sitting in a wheelchair.  Appears in no acute distress  Cardiovascular:     Rate and Rhythm: Normal rate and regular rhythm.     Heart sounds: Normal heart sounds.  Pulmonary:     Effort: Pulmonary effort is normal.     Breath sounds: Normal breath sounds.  Abdominal:     General: Bowel sounds are normal.     Palpations: Abdomen is soft.  Musculoskeletal:     Comments: Trace bilateral pedal edema overall improved  Skin:    General: Skin is warm and dry.     Comments: Diffuse erythematous rash noted over trunk bilateral arms and legs  Neurological:     Mental Status: She is alert and oriented to person, place, and time.         Latest Ref Rng & Units 10/11/2022   12:34 PM  CMP  Glucose 70 - 99 mg/dL 136   BUN 8 - 23 mg/dL 22   Creatinine 0.44 - 1.00 mg/dL 1.09   Sodium 135 - 145 mmol/L 128   Potassium 3.5 - 5.1 mmol/L 4.6   Chloride 98 - 111 mmol/L 98   CO2 22 - 32 mmol/L 22   Calcium 8.9 - 10.3 mg/dL 8.2   Total Protein 6.5 - 8.1 g/dL 5.1   Total Bilirubin 0.3 - 1.2 mg/dL 0.2   Alkaline Phos 38 - 126 U/L 86   AST 15 - 41 U/L 20   ALT 0 - 44 U/L 11       Latest Ref Rng & Units 10/11/2022   12:34 PM  CBC  WBC 4.0 - 10.5 K/uL 0.5   Hemoglobin 12.0 - 15.0 g/dL 7.0   Hematocrit 36.0 - 46.0 % 21.3   Platelets 150 - 400 K/uL 60     No images are attached to the encounter.  No results found.   Assessment and plan- Patient is a 89 y.o. female with high risk kappa light chain multiple myeloma here for routine follow-up  Patient has significant leukopenia/neutropenia likely secondary to Revlimid.  Skin rash could be either secondary to Revlimid or allopurinol.  She was supposed to start her next dose of Revlimid today.  Have asked her to hold off on taking any  Revlimid or allopurinol at this time.  I will see her back in 2 weeks with repeat counts.  If her [place] is more than 1 I will restart Revlimid at that time at the lowest dose of 2.5 mg 3 weeks on and 1 week off.  Presently she is on 10 mg 3 weeks on and 1 week off.  Depending on how she tolerates the lowest dose of Revlimid I will consider adding Darzalex in 6 to 8 weeks time.  Given that she is elderly with significant neutropenia I am giving her Augmentin  for antibiotic prophylaxis.  Hemoglobin is down to 7 today and she will receive 1 unit of PRBC transfusion tomorrow.  Kappa light chains are trending down after starting Revlimid indicating response to treatment.   Visit Diagnosis 1. Multiple myeloma not having achieved remission ([place])   2. Anemia, unspecified type   3. Drug-induced neutropenia (HCC)   4. Drug-induced skin rash      Dr. Randa Evens, MD, MPH [facility] at [facility] [phone] 10/12/2022 11:44 AM

## 2022-10-12 NOTE — Telephone Encounter (Signed)
*  STAT* If patient is at the pharmacy, call can be transferred to refill team.   1. Which medications need to be refilled? (please list name of each medication and dose if known) metoprolol tartrate (LOPRESSOR) 25 MG tablet   2. Which pharmacy/location (including street and city if local pharmacy) is medication to be sent to? [id number] [place], [place]- [address]  3. Do they need a 30 day or 90 day supply? 30 day  Patient has 1 day left.

## 2022-10-12 NOTE — Patient Instructions (Signed)
Blood Transfusion, Adult A blood transfusion is a procedure in which you receive blood through an IV tube. You may need this procedure because of: A bleeding disorder. An illness. An injury. A surgery. The blood may come from someone else (a donor). You may also be able to donate blood for yourself before a surgery. The blood given in a transfusion may be made up of different types of cells. You may get: Red blood cells. These carry oxygen to the cells in the body. Platelets. These help your blood to clot. Plasma. This is the liquid part of your blood. It carries proteins and other substances through the body. White blood cells. These help you fight infections. If you have a clotting disorder, you may also get other types of blood products. Depending on the type of blood product, this procedure may take 1-4 hours to complete. Tell your doctor about: Any bleeding problems you have. Any reactions you have had during a blood transfusion in the past. Any allergies you have. All medicines you are taking, including vitamins, herbs, eye drops, creams, and over-the-counter medicines. Any surgeries you have had. Any medical conditions you have. Whether you are pregnant or may be pregnant. What are the risks? Talk with your health care provider about risks. The most common problems include: A mild allergic reaction. This includes red, swollen areas of skin (hives) and itching. Fever or chills. This may be the body's response to new blood cells received. This may happen during or up to 4 hours after the transfusion. More serious problems may include: A serious allergic reaction. This includes breathing trouble or swelling around the face and lips. Too much fluid in the lungs. This may cause breathing problems. Lung injury. This causes breathing trouble and low oxygen in the blood. This can happen within hours of the transfusion or days later. Too much iron. This can happen after getting many blood  transfusions over a period of time. An infection or virus passed through the blood. This is rare. Donated blood is carefully tested before it is given. Your body's defense system (immune system) trying to attack the new blood cells. This is rare. Symptoms may include fever, chills, nausea, low blood pressure, and low back or chest pain. Donated cells attacking healthy tissues. This is rare. What happens before the procedure? You will have a blood test to find out your blood type. The test also finds out what type of blood your body will accept and matches it to the donor type. If you are going to have a planned surgery, you may be able to donate your own blood. This may be done in case you need a transfusion. You will have your temperature, blood pressure, and pulse checked. You may receive medicine to help prevent an allergic reaction. This may be done if you have had a reaction to a transfusion before. This medicine may be given to you by mouth or through an IV tube. What happens during the procedure?  An IV tube will be put into one of your veins. The bag of blood will be attached to your IV tube. Then, the blood will enter through your vein. Your temperature, blood pressure, and pulse will be checked often. This is done to find early signs of a transfusion reaction. Tell your nurse right away if you have any of these symptoms: Shortness of breath or trouble breathing. Chest or back pain. Fever or chills. Red, swollen areas of skin or itching. If you have any signs   or symptoms of a reaction, your transfusion will be stopped. You may also be given medicine. When the transfusion is finished, your IV tube will be taken out. Pressure may be put on the IV site for a few minutes. A bandage (dressing) will be put on the IV site. The procedure may vary among doctors and hospitals. What happens after the procedure? You will be monitored until you leave the hospital or clinic. This includes  checking your temperature, blood pressure, pulse, breathing rate, and blood oxygen level. Your blood may be tested to see how you have responded to the transfusion. You may be warmed with fluids or blankets. This is done to keep the temperature of your body normal. If you have your procedure in an outpatient setting, you will be told whom to contact to report any reactions. Where to find more information Visit the [employer]: redcross.org Summary A blood transfusion is a procedure in which you receive blood through an IV tube. The blood you are given may be made up of different blood cells. You may receive red blood cells, platelets, plasma, or white blood cells. Your temperature, blood pressure, and pulse will be checked often. After the procedure, your blood may be tested to see how you have responded. This information is not intended to replace advice given to you by your health care provider. Make sure you discuss any questions you have with your health care provider. Document Revised: 12/16/2021 Document Reviewed: 12/16/2021 Elsevier Patient Education  2023 Elsevier Inc.  

## 2022-10-13 LAB — BPAM RBC
Blood Product Expiration Date: 202402142359
ISSUE DATE / TIME: 202401110907
Unit Type and Rh: 6200

## 2022-10-13 LAB — TYPE AND SCREEN
ABO/RH(D): A POS
Antibody Screen: NEGATIVE
Unit division: 0

## 2022-10-13 MED ORDER — METOPROLOL TARTRATE 25 MG PO TABS: 12.5000 mg | ORAL_TABLET | Freq: Two times a day (BID) | ORAL | 2 refills | Status: AC

## 2022-10-13 NOTE — Telephone Encounter (Signed)
Requested Prescriptions   Signed Prescriptions Disp Refills   metoprolol tartrate (LOPRESSOR) 25 MG tablet 30 tablet 2    Sig: Take 0.5 tablets (12.5 mg total) by mouth 2 (two) times daily.    Authorizing Provider: Minna Merritts    Ordering User: Raelene Bott, Wynton Hufstetler L

## 2022-10-15 ENCOUNTER — Encounter: Payer: Self-pay | Admitting: Internal Medicine

## 2022-10-15 ENCOUNTER — Inpatient Hospital Stay
Admission: EM | Admit: 2022-10-15 | Discharge: 2022-10-17 | DRG: 291 | Disposition: A | Discharge status: Home Health | Payer: Medicare PPO | Attending: Hospitalist | Admitting: Hospitalist

## 2022-10-15 ENCOUNTER — Emergency Department: Payer: Medicare PPO

## 2022-10-15 DIAGNOSIS — I110 Hypertensive heart disease with heart failure: Secondary | ICD-10-CM | POA: Diagnosis present

## 2022-10-15 DIAGNOSIS — Z79899 Other long term (current) drug therapy: Secondary | ICD-10-CM | POA: Diagnosis not present

## 2022-10-15 DIAGNOSIS — Z7902 Long term (current) use of antithrombotics/antiplatelets: Secondary | ICD-10-CM

## 2022-10-15 DIAGNOSIS — E039 Hypothyroidism, unspecified: Secondary | ICD-10-CM | POA: Diagnosis present

## 2022-10-15 DIAGNOSIS — C9000 Multiple myeloma not having achieved remission: Secondary | ICD-10-CM | POA: Diagnosis present

## 2022-10-15 DIAGNOSIS — R0902 Hypoxemia: Secondary | ICD-10-CM | POA: Diagnosis not present

## 2022-10-15 DIAGNOSIS — K5909 Other constipation: Secondary | ICD-10-CM | POA: Diagnosis present

## 2022-10-15 DIAGNOSIS — Z8249 Family history of ischemic heart disease and other diseases of the circulatory system: Secondary | ICD-10-CM

## 2022-10-15 DIAGNOSIS — J439 Emphysema, unspecified: Secondary | ICD-10-CM | POA: Diagnosis present

## 2022-10-15 DIAGNOSIS — Z66 Do not resuscitate: Secondary | ICD-10-CM | POA: Diagnosis present

## 2022-10-15 DIAGNOSIS — Z801 Family history of malignant neoplasm of trachea, bronchus and lung: Secondary | ICD-10-CM | POA: Diagnosis not present

## 2022-10-15 DIAGNOSIS — I491 Atrial premature depolarization: Secondary | ICD-10-CM | POA: Diagnosis not present

## 2022-10-15 DIAGNOSIS — J90 Pleural effusion, not elsewhere classified: Secondary | ICD-10-CM | POA: Diagnosis present

## 2022-10-15 DIAGNOSIS — Z90710 Acquired absence of both cervix and uterus: Secondary | ICD-10-CM

## 2022-10-15 DIAGNOSIS — Z79890 Hormone replacement therapy (postmenopausal): Secondary | ICD-10-CM | POA: Diagnosis not present

## 2022-10-15 DIAGNOSIS — R0600 Dyspnea, unspecified: Principal | ICD-10-CM

## 2022-10-15 DIAGNOSIS — T451X5A Adverse effect of antineoplastic and immunosuppressive drugs, initial encounter: Secondary | ICD-10-CM | POA: Diagnosis present

## 2022-10-15 DIAGNOSIS — Z8051 Family history of malignant neoplasm of kidney: Secondary | ICD-10-CM

## 2022-10-15 DIAGNOSIS — Z7982 Long term (current) use of aspirin: Secondary | ICD-10-CM | POA: Diagnosis not present

## 2022-10-15 DIAGNOSIS — D61810 Antineoplastic chemotherapy induced pancytopenia: Secondary | ICD-10-CM | POA: Diagnosis present

## 2022-10-15 DIAGNOSIS — Z820 Family history of epilepsy and other diseases of the nervous system: Secondary | ICD-10-CM | POA: Diagnosis not present

## 2022-10-15 DIAGNOSIS — J9621 Acute and chronic respiratory failure with hypoxia: Secondary | ICD-10-CM | POA: Diagnosis present

## 2022-10-15 DIAGNOSIS — Z85828 Personal history of other malignant neoplasm of skin: Secondary | ICD-10-CM

## 2022-10-15 DIAGNOSIS — Z841 Family history of disorders of kidney and ureter: Secondary | ICD-10-CM | POA: Diagnosis not present

## 2022-10-15 DIAGNOSIS — I739 Peripheral vascular disease, unspecified: Secondary | ICD-10-CM | POA: Diagnosis present

## 2022-10-15 DIAGNOSIS — K5900 Constipation, unspecified: Secondary | ICD-10-CM | POA: Diagnosis present

## 2022-10-15 DIAGNOSIS — I5033 Acute on chronic diastolic (congestive) heart failure: Secondary | ICD-10-CM | POA: Diagnosis present

## 2022-10-15 DIAGNOSIS — Z1152 Encounter for screening for COVID-19: Secondary | ICD-10-CM | POA: Diagnosis not present

## 2022-10-15 DIAGNOSIS — I350 Nonrheumatic aortic (valve) stenosis: Secondary | ICD-10-CM | POA: Diagnosis present

## 2022-10-15 DIAGNOSIS — R531 Weakness: Secondary | ICD-10-CM | POA: Diagnosis not present

## 2022-10-15 DIAGNOSIS — Z803 Family history of malignant neoplasm of breast: Secondary | ICD-10-CM | POA: Diagnosis not present

## 2022-10-15 DIAGNOSIS — I959 Hypotension, unspecified: Secondary | ICD-10-CM | POA: Diagnosis not present

## 2022-10-15 DIAGNOSIS — I10 Essential (primary) hypertension: Secondary | ICD-10-CM | POA: Diagnosis present

## 2022-10-15 LAB — RESP PANEL BY RT-PCR (RSV, FLU A&B, COVID)  RVPGX2
Influenza A by PCR: NEGATIVE
Influenza B by PCR: NEGATIVE
Resp Syncytial Virus by PCR: NEGATIVE
SARS Coronavirus 2 by RT PCR: NEGATIVE

## 2022-10-15 LAB — URINALYSIS, ROUTINE W REFLEX MICROSCOPIC
Bacteria, UA: NONE SEEN
Bilirubin Urine: NEGATIVE
Glucose, UA: NEGATIVE mg/dL
Hgb urine dipstick: NEGATIVE
Ketones, ur: NEGATIVE mg/dL
Leukocytes,Ua: NEGATIVE
Nitrite: NEGATIVE
Protein, ur: 100 mg/dL — AB
Specific Gravity, Urine: 1.016 (ref 1.005–1.030)
Squamous Epithelial / HPF: NONE SEEN /HPF (ref 0–5)
pH: 5 (ref 5.0–8.0)

## 2022-10-15 LAB — CBC WITH DIFFERENTIAL/PLATELET
Abs Immature Granulocytes: 0.01 10*3/uL (ref 0.00–0.07)
Basophils Absolute: 0 10*3/uL (ref 0.0–0.1)
Basophils Relative: 1 %
Eosinophils Absolute: 0.8 10*3/uL — ABNORMAL HIGH (ref 0.0–0.5)
Eosinophils Relative: 47 %
HCT: 26.7 % — ABNORMAL LOW (ref 36.0–46.0)
Hemoglobin: 8.6 g/dL — ABNORMAL LOW (ref 12.0–15.0)
Immature Granulocytes: 1 %
Lymphocytes Relative: 21 %
Lymphs Abs: 0.4 10*3/uL — ABNORMAL LOW (ref 0.7–4.0)
MCH: 32.5 pg (ref 26.0–34.0)
MCHC: 32.2 g/dL (ref 30.0–36.0)
MCV: 100.8 fL — ABNORMAL HIGH (ref 80.0–100.0)
Monocytes Absolute: 0 10*3/uL — ABNORMAL LOW (ref 0.1–1.0)
Monocytes Relative: 1 %
Neutro Abs: 0.5 10*3/uL — ABNORMAL LOW (ref 1.7–7.7)
Neutrophils Relative %: 29 %
Platelets: 77 10*3/uL — ABNORMAL LOW (ref 150–400)
RBC: 2.65 MIL/uL — ABNORMAL LOW (ref 3.87–5.11)
RDW: 18.7 % — ABNORMAL HIGH (ref 11.5–15.5)
Smear Review: NORMAL
WBC: 1.7 10*3/uL — ABNORMAL LOW (ref 4.0–10.5)
nRBC: 0 % (ref 0.0–0.2)

## 2022-10-15 LAB — BASIC METABOLIC PANEL
Anion gap: 7 (ref 5–15)
BUN: 15 mg/dL (ref 8–23)
CO2: 20 mmol/L — ABNORMAL LOW (ref 22–32)
Calcium: 8.1 mg/dL — ABNORMAL LOW (ref 8.9–10.3)
Chloride: 106 mmol/L (ref 98–111)
Creatinine, Ser: 0.88 mg/dL (ref 0.44–1.00)
GFR, Estimated: >60 mL/min (ref >60)
Glucose, Bld: 149 mg/dL — ABNORMAL HIGH (ref 70–99)
Potassium: 3.7 mmol/L (ref 3.5–5.1)
Sodium: 133 mmol/L — ABNORMAL LOW (ref 135–145)

## 2022-10-15 LAB — BRAIN NATRIURETIC PEPTIDE: B Natriuretic Peptide: 476 pg/mL — ABNORMAL HIGH (ref 0.0–100.0)

## 2022-10-15 LAB — TSH: TSH: 8.122 u[IU]/mL — ABNORMAL HIGH (ref 0.350–4.500)

## 2022-10-15 MED ORDER — AMOXICILLIN-POT CLAVULANATE 875-125 MG PO TABS
1.0000 | ORAL_TABLET | Freq: Two times a day (BID) | ORAL | Status: DC
  Administered 2022-10-15 – 2022-10-17 (×4): 1 via ORAL
  Filled 2022-10-15 (×5): qty 1

## 2022-10-15 MED ORDER — CLOPIDOGREL BISULFATE 75 MG PO TABS
75.0000 mg | ORAL_TABLET | Freq: Every day | ORAL | Status: DC
  Administered 2022-10-16 – 2022-10-17 (×2): 75 mg via ORAL
  Filled 2022-10-15 (×3): qty 1

## 2022-10-15 MED ORDER — CLONIDINE HCL 0.1 MG PO TABS
0.1000 mg | ORAL_TABLET | ORAL | Status: DC
  Administered 2022-10-15 – 2022-10-17 (×4): 0.1 mg via ORAL
  Filled 2022-10-15 (×4): qty 1

## 2022-10-15 MED ORDER — ACETAMINOPHEN 650 MG RE SUPP: 650.0000 mg | Freq: Four times a day (QID) | RECTAL | Status: DC | PRN

## 2022-10-15 MED ORDER — LEVOTHYROXINE SODIUM 50 MCG PO TABS
75.0000 ug | ORAL_TABLET | Freq: Every day | ORAL | Status: DC
  Administered 2022-10-16 – 2022-10-17 (×2): 75 ug via ORAL
  Filled 2022-10-15: qty 1
  Filled 2022-10-15: qty 2
  Filled 2022-10-15: qty 1

## 2022-10-15 MED ORDER — ACETAMINOPHEN 325 MG PO TABS
650.0000 mg | ORAL_TABLET | Freq: Four times a day (QID) | ORAL | Status: DC | PRN
  Administered 2022-10-16: 325 mg via ORAL
  Filled 2022-10-15: qty 2

## 2022-10-15 MED ORDER — LOSARTAN POTASSIUM 50 MG PO TABS
100.0000 mg | ORAL_TABLET | Freq: Every day | ORAL | Status: DC
  Administered 2022-10-16 – 2022-10-17 (×2): 100 mg via ORAL
  Filled 2022-10-15 (×4): qty 2

## 2022-10-15 MED ORDER — POTASSIUM CHLORIDE CRYS ER 10 MEQ PO TBCR
20.0000 meq | EXTENDED_RELEASE_TABLET | ORAL | Status: DC
  Administered 2022-10-16: 20 meq via ORAL
  Filled 2022-10-15 (×2): qty 2

## 2022-10-15 MED ORDER — TRAZODONE HCL 50 MG PO TABS: 25.0000 mg | ORAL_TABLET | Freq: Every evening | ORAL | Status: DC | PRN

## 2022-10-15 MED ORDER — SODIUM CHLORIDE 0.9 % IV SOLN: 250.0000 mL | INTRAVENOUS | Status: DC | PRN

## 2022-10-15 MED ORDER — AMLODIPINE BESYLATE 5 MG PO TABS
2.5000 mg | ORAL_TABLET | Freq: Two times a day (BID) | ORAL | Status: DC
  Administered 2022-10-15: 5 mg via ORAL
  Administered 2022-10-16: 2.5 mg via ORAL
  Administered 2022-10-16: 5 mg via ORAL
  Administered 2022-10-17: 2.5 mg via ORAL
  Filled 2022-10-15 (×4): qty 1

## 2022-10-15 MED ORDER — FUROSEMIDE 20 MG PO TABS
20.0000 mg | ORAL_TABLET | ORAL | Status: DC
  Administered 2022-10-16: 20 mg via ORAL
  Filled 2022-10-15 (×3): qty 1

## 2022-10-15 MED ORDER — SODIUM CHLORIDE 0.9% FLUSH: 3.0000 mL | INTRAVENOUS | Status: DC | PRN

## 2022-10-15 MED ORDER — METOPROLOL TARTRATE 25 MG PO TABS
25.0000 mg | ORAL_TABLET | Freq: Every day | ORAL | Status: DC
  Administered 2022-10-16 – 2022-10-17 (×2): 25 mg via ORAL
  Filled 2022-10-15 (×3): qty 1

## 2022-10-15 MED ORDER — SENNA 8.6 MG PO TABS
1.0000 | ORAL_TABLET | Freq: Two times a day (BID) | ORAL | Status: DC
  Administered 2022-10-15 – 2022-10-17 (×4): 8.6 mg via ORAL
  Filled 2022-10-15 (×5): qty 1

## 2022-10-15 MED ORDER — HEPARIN SODIUM (PORCINE) 5000 UNIT/ML IJ SOLN
5000.0000 [IU] | Freq: Three times a day (TID) | INTRAMUSCULAR | Status: DC
  Administered 2022-10-15 – 2022-10-17 (×5): 5000 [IU] via SUBCUTANEOUS
  Filled 2022-10-15 (×5): qty 1

## 2022-10-15 MED ORDER — SODIUM CHLORIDE 0.9% FLUSH
3.0000 mL | Freq: Two times a day (BID) | INTRAVENOUS | Status: DC
  Administered 2022-10-15 – 2022-10-17 (×4): 3 mL via INTRAVENOUS

## 2022-10-15 MED ORDER — KETOCONAZOLE 2 % EX CREA: 1.0000 | TOPICAL_CREAM | Freq: Every day | CUTANEOUS | Status: DC | PRN

## 2022-10-15 MED ORDER — IOHEXOL 350 MG/ML SOLN
75.0000 mL | Freq: Once | INTRAVENOUS | Status: AC | PRN
  Administered 2022-10-15: 75 mL via INTRAVENOUS

## 2022-10-15 MED ORDER — SODIUM CHLORIDE 0.9 % IV SOLN: Freq: Once | INTRAVENOUS | Status: DC

## 2022-10-15 NOTE — Assessment & Plan Note (Signed)
Last TSH 12/29/21 1.87  Plan Continue home dose levothyroxine  TSH with dose adjustments as indicated

## 2022-10-15 NOTE — ED Notes (Signed)
Pt oxygen dropped to 82% on [place]. Placed pt back on [place] at 2 liters.

## 2022-10-15 NOTE — H&P (Signed)
History and Physical    ELYSABETH AUST URK:[id number] DOB: 07-10-1925 DOA: 10/15/2022  DOS: the patient was seen and examined on 10/15/2022  PCP: Crecencio Mc, MD   Patient coming from: Home  I have personally briefly reviewed patient's old medical records in [facility]  Ms. Corea, a 89 y/o woman with medical h/o emphysema, hypothroidism, HTN, HFpEF and multiple myeloma. Since her last hospitalization in November '23 she has been on nocturnal oxygen supplementation at 1.5L [place]. For several weeks prior to admission she has had increased weakness and fatigue. At her last oncology visit 10/11/22 she had marked leukopenia and leukemia 2/2 chemotherapy. Her treatments were put on hold, she was started on prophylactic abx and received 1uPRBCs on 10/12/22.  On the day of admission patient had a 4 hour nosebleed along with increased SOB. EMT was called and detected hypoxemia with O2 sat < 90%. Patient was transported to [facility]ED for further evaluation.   ED Course: T 98  153/63  HR 88 RR 19. Elderly woman in no acute distress but hypoxemic. Lab: glucose 149, Cr 0;88, WBC 1.7, Hgb 8.6, Plts 77, diff - 29/21/47 eos. CXR - small left pleural effusion, Stable cardiomegaly, atlectasis. CTA chest - no PE, large bilateral pleural effusions with atelectasis, no pulmonary edema. Oxygen requirement in ED 2L [place]. TRH called to admit for continued oxygen support and consult to IR for thoracentesis.  Review of Systems:  Review of Systems  Constitutional:  Positive for malaise/fatigue. Negative for chills, fever and weight loss.  HENT:  Positive for hearing loss and nosebleeds. Negative for sinus pain and sore throat.   Eyes: Negative.   Respiratory:  Positive for shortness of breath. Negative for cough, hemoptysis, sputum production and wheezing.   Cardiovascular:  Positive for leg swelling. Negative for chest pain and palpitations.  Gastrointestinal: Negative.   Genitourinary: Negative.   Musculoskeletal:  Negative.   Skin:  Positive for rash. Negative for itching.       Persistent skin discoloration/rash 2/2 chemotherapy. Currently w/o pruritus  Neurological: Negative.   Endo/Heme/Allergies:  Bruises/bleeds easily.  Psychiatric/Behavioral: Negative.      Past Medical History:  Diagnosis Date   Actinic keratosis    Allergy    Anemia 02/25/2015   Aortic stenosis    Arrhythmia    atrial fibrillation/ SVT   Basal cell carcinoma 06/01/2021   R temple - ED&C   Carotid artery occlusion    CHF (congestive heart failure) (HCC)    Colon polyps    Fall from slipping on ice 11/2013   Heart murmur    Hypertension    Menopause    Multiple myeloma ([place])    PAD (peripheral artery disease) ([place])    Pneumonia    Positive TB test    pt denies   Sore throat    Squamous cell carcinoma of skin 04/17/2019   L post lateral ankle, shave removal 10/15/2019   Thyroid disease    Varicose veins     Past Surgical History:  Procedure Laterality Date   ABDOMINAL HYSTERECTOMY     APPENDECTOMY     BILATERAL SALPINGOOPHORECTOMY  1964   CESAREAN SECTION     EYE SURGERY Right March 2016   Cataract   LOWER EXTREMITY ANGIOGRAPHY Left 03/14/2019   Procedure: LOWER EXTREMITY ANGIOGRAPHY;  Surgeon: Katha Cabal, MD;  Location: [facility];  Service: Cardiovascular;  Laterality: Left;   TONSILLECTOMY     VEIN SURGERY Left    Leg  Soc Hx- long term widow. Has one daughter, one son, 4 grands, 2 great grands. She lives alone. 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 Very independent. Discussed code status, daughter present, and she clearly states she does not want cardiac resuscitation or intubation.   reports that she has never smoked. She has never used smokeless tobacco. She reports that she does not drink alcohol and does not use drugs.  Allergies  Allergen Reactions   Hctz [Hydrochlorothiazide] Other (See Comments)    Hyponatremia    Adhesive [Tape] Rash     Peels skin   Brimonidine Rash and Other (See Comments)    Pt felt as if throat was closing up, pt also had rash    Family History  Problem Relation Age of Onset   Heart disease Mother        After age 72   Hypertension Mother    Deep vein thrombosis Mother    Heart attack Mother    Kidney disease Father    Alcohol abuse Brother    Heart disease Brother    Cancer Brother        Lung cancer   Cancer Brother        kidney cancer   Varicose Veins Sister    Heart disease Sister        After age 49   Heart attack Sister    Heart disease Sister        After age 1   Other Other        epilepsy   Hypertension Other    Cancer Paternal Grandmother        breast cancer   Cancer Daughter     Prior to Admission medications   Medication Sig Start Date End Date Taking? Authorizing Provider  clopidogrel (PLAVIX) 75 MG tablet Take 75 mg by mouth daily. 10/12/22  Yes [provider]  allopurinol (ZYLOPRIM) 300 MG tablet TAKE 1 TABLET BY MOUTH EVERY DAY 09/20/22   Sindy Guadeloupe, MD  amLODipine (NORVASC) 5 MG tablet Take 0.5 tab by mouth every AM, and take 1 tab  by mouth every PM 06/19/22   Dunn, Areta Haber, PA-C  amoxicillin-clavulanate (AUGMENTIN) 875-125 MG tablet Take 1 tablet by mouth 2 (two) times daily. 10/11/22   Sindy Guadeloupe, MD  ASPIRIN 81 PO Take 81 mg by mouth daily.    [provider]  cloNIDine (CATAPRES) 0.1 MG tablet 1 tablet in the am and 1 In the evening 08/11/22   Annita Brod, MD  dexamethasone (DECADRON) 4 MG tablet Take 5 tablets (20 mg total) by mouth once a week. Take with food. 09/20/22   Sindy Guadeloupe, MD  dorzolamide (TRUSOPT) 2 % ophthalmic solution Place 1 drop into both eyes 3 (three) times daily.  07/18/16   [provider]  furosemide (LASIX) 20 MG tablet Take 1 tablet (20 mg total) by mouth every other day. 08/22/22 11/20/22  Minna Merritts, MD  ketoconazole (NIZORAL) 2 % cream Apply 1 Application topically at bedtime. 06/01/22  05/21/24  Ralene Bathe, MD  lenalidomide (REVLIMID) 2.5 MG capsule Take 1 capsule (2.5 mg total) by mouth daily. Take for 14 days, then hold for 7 days. Repeat every 21 days. 10/11/22   Sindy Guadeloupe, MD  levothyroxine (SYNTHROID) 75 MCG tablet TAKE 1 TABLET BY MOUTH EVERY DAY 07/03/22   Crecencio Mc, MD  loratadine (CLARITIN) 10 MG tablet Take 10 mg by mouth at bedtime  as needed for allergies (For the rash).    [provider]  losartan (COZAAR) 100 MG tablet TAKE 1 TABLET BY MOUTH EVERY DAY 08/15/22   Crecencio Mc, MD  metoprolol tartrate (LOPRESSOR) 25 MG tablet Take 0.5 tablets (12.5 mg total) by mouth 2 (two) times daily. 10/13/22   Minna Merritts, MD  Multiple Vitamins-Minerals (PRESERVISION AREDS 2) CAPS Take 1 capsule by mouth 2 (two) times daily.     [provider]  NEEDLE, DISP, 25 G 25G X 1" MISC Use to give b12 injection once monthly. Patient not taking: Reported on 08/22/2022 02/13/22   Crecencio Mc, MD  potassium chloride (KLOR-CON) 10 MEQ tablet Take 2 tablets (20 mEq total) by mouth every other day. 08/22/22 11/20/22  Minna Merritts, MD    Physical Exam: Vitals:   10/15/22 1700 10/15/22 1809 10/15/22 1847 10/15/22 1930  BP: (!) 140/70 (!) 163/92 (!) 153/63 (!) 159/59  Pulse: 85 91 88 86  Resp: '14 19 19 12  '$ Temp:    98.4 F (36.9 C)  TempSrc:    Oral  SpO2: 92% 94% 99% 98%  Weight:      Height:        Physical Exam Vitals and nursing note reviewed.  Constitutional:      Comments: Thin, chronically ill in appearance  HENT:     Head: Normocephalic and atraumatic.     Mouth/Throat:     Mouth: Mucous membranes are moist.     Pharynx: Oropharynx is clear. No oropharyngeal exudate.  Eyes:     Extraocular Movements: Extraocular movements intact.     Conjunctiva/sclera: Conjunctivae normal.     Pupils: Pupils are equal, round, and reactive to light.     Comments: Drooping right eyelid-chronic condition  Neck:     Vascular: No carotid  bruit.  Cardiovascular:     Rate and Rhythm: Normal rate and regular rhythm.     Heart sounds: Murmur heard.     Comments: 2+ radial pulse, trace DP pulse (c/w PAD), slow capillary refill at toes.  Grade III/VI holosystic mm at RSB, LSB, apex Pulmonary:     Effort: Pulmonary effort is normal. No respiratory distress.     Breath sounds: No wheezing.     Comments: Decreased BS at bases, no wheezing. No labored respirations.  Abdominal:     General: Bowel sounds are normal. There is no distension.     Palpations: Abdomen is soft. There is no mass.     Tenderness: There is no abdominal tenderness. There is no guarding.  Musculoskeletal:        General: Normal range of motion.     Cervical back: Normal range of motion and neck supple.     Right lower leg: No edema.     Left lower leg: No edema.  Lymphadenopathy:     Cervical: No cervical adenopathy.  Skin:    General: Skin is warm.     Capillary Refill: Capillary refill takes less than 2 seconds.     Findings: Bruising and rash present.     Comments: Erythema with areas of palor.  Abrasions over both shins.  Neurological:     General: No focal deficit present.     Mental Status: She is alert and oriented to person, place, and time. Mental status is at baseline.     Cranial Nerves: No cranial nerve deficit.  Psychiatric:        Mood and Affect: Mood normal.  Behavior: Behavior normal.      Labs on Admission: I have personally reviewed following labs and imaging studies  CBC: Recent Labs  Lab 10/11/22 1234 10/15/22 1332  WBC 0.5* 1.7*  NEUTROABS 0.2* 0.5*  HGB 7.0* 8.6*  HCT 21.3* 26.7*  MCV 102.9* 100.8*  PLT 60* 77*   Basic Metabolic Panel: Recent Labs  Lab 10/11/22 1234 10/15/22 1332  NA 128* 133*  K 4.6 3.7  CL 98 106  CO2 22 20*  GLUCOSE 136* 149*  BUN 22 15  CREATININE 1.09* 0.88  CALCIUM 8.2* 8.1*   GFR: Estimated Creatinine Clearance: 30.2 mL/min (by C-G formula based on SCr of 0.88  mg/dL). Liver Function Tests: Recent Labs  Lab 10/11/22 1234  AST 20  ALT 11  ALKPHOS 86  BILITOT 0.2*  PROT 5.1*  ALBUMIN 2.8*   No results for input(s): "LIPASE", "AMYLASE" in the last 168 hours. No results for input(s): "AMMONIA" in the last 168 hours. Coagulation Profile: No results for input(s): "INR", "PROTIME" in the last 168 hours. Cardiac Enzymes: No results for input(s): "CKTOTAL", "CKMB", "CKMBINDEX", "TROPONINI" in the last 168 hours. BNP (last 3 results) No results for input(s): "PROBNP" in the last 8760 hours. HbA1C: No results for input(s): "HGBA1C" in the last 72 hours. CBG: No results for input(s): "GLUCAP" in the last 168 hours. Lipid Profile: No results for input(s): "CHOL", "HDL", "LDLCALC", "TRIG", "CHOLHDL", "LDLDIRECT" in the last 72 hours. Thyroid Function Tests: No results for input(s): "TSH", "T4TOTAL", "FREET4", "T3FREE", "THYROIDAB" in the last 72 hours. Anemia Panel: No results for input(s): "VITAMINB12", "FOLATE", "FERRITIN", "TIBC", "IRON", "RETICCTPCT" in the last 72 hours. Urine analysis:    Component Value Date/Time   COLORURINE YELLOW (A) 10/15/2022 1332   APPEARANCEUR CLEAR (A) 10/15/2022 1332   LABSPEC 1.016 10/15/2022 1332   PHURINE 5.0 10/15/2022 1332   GLUCOSEU NEGATIVE 10/15/2022 1332   HGBUR NEGATIVE 10/15/2022 1332   BILIRUBINUR NEGATIVE 10/15/2022 1332   KETONESUR NEGATIVE 10/15/2022 1332   PROTEINUR 100 (A) 10/15/2022 1332   NITRITE NEGATIVE 10/15/2022 1332   LEUKOCYTESUR NEGATIVE 10/15/2022 1332    Radiological Exams on Admission: I have personally reviewed images CT Angio Chest PE W and/or Wo Contrast  Result Date: 10/15/2022 CLINICAL DATA:  Hypoxia.  Nose bleeds.  Weakness. EXAM: CT ANGIOGRAPHY CHEST WITH CONTRAST TECHNIQUE: Multidetector CT imaging of the chest was performed using the standard protocol during bolus administration of intravenous contrast. Multiplanar CT image reconstructions and MIPs were obtained to  evaluate the vascular anatomy. RADIATION DOSE REDUCTION: This exam was performed according to the departmental dose-optimization program which includes automated exposure control, adjustment of the mA and/or kV according to patient size and/or use of iterative reconstruction technique. CONTRAST:  20m OMNIPAQUE IOHEXOL 350 MG/ML SOLN COMPARISON:  One-view chest x-ray 10/15/2022. PET scan 08/23/2022. CT of the abdomen and pelvis 08/03/2022 FINDINGS: Cardiovascular: The heart size is mildly enlarged. Coronary artery calcifications are present. Calcifications are present at the aortic valve and aortic arch. No focal stenosis or aneurysm is present. Calcifications are present in the descending thoracic aorta. Pulmonary artery opacification is excellent. No focal filling defects are present to suggest pulmonary embolus. Pulmonary artery size is normal. Mediastinum/Nodes: No enlarged mediastinal, hilar, or axillary lymph nodes. Thyroid gland, trachea, and esophagus demonstrate no significant findings. Lungs/Pleura: Large pleural effusions are present bilaterally. Partial collapse of both lower lobes noted. No central obstructing lesion is present. Patchy airspace opacity is separate from the collapse in the right lower lobe. No other  focal airspace disease is present. No nodule or mass lesion is present. The airways are patent. Upper Abdomen: Atherosclerotic calcifications are present in the aorta. Visualized upper abdomen is otherwise unremarkable. Musculoskeletal: Further collapse of the L1 compression fracture noted. The body heights are otherwise maintained. Review of the MIP images confirms the above findings. IMPRESSION: 1. No pulmonary embolus. 2. Large pleural effusions bilaterally with partial collapse of both lower lobes. 3. Patchy airspace opacity is separate from the collapse in the right lower lobe. This likely represents atelectasis. Infection is not excluded. 4. Further collapse of the L1 compression  fracture. 5. Coronary artery disease. 6.  Aortic Atherosclerosis (ICD10-I70.0). Electronically Signed   By: San Morelle M.D.   On: 10/15/2022 16:43   DG Chest Portable 1 View  Result Date: 10/15/2022 CLINICAL DATA:  Hypoxia EXAM: PORTABLE CHEST 1 VIEW COMPARISON:  08/08/2022 chest radiograph. FINDINGS: Stable cardiomediastinal silhouette with mild cardiomegaly. No pneumothorax. Small left pleural effusion. Trace right pleural effusion. No overt pulmonary edema. Hazy bibasilar lung opacities. IMPRESSION: 1. Small left pleural effusion. Trace right pleural effusion. 2. Hazy bibasilar lung opacities, favor atelectasis. 3. Stable mild cardiomegaly without overt pulmonary edema. Electronically Signed   By: Ilona Sorrel M.D.   On: 10/15/2022 14:08    EKG: I have personally reviewed EKG:  no new EKG  Assessment/Plan Principal Problem:   Pleural effusion, bilateral Active Problems:   Multiple myeloma not having achieved remission (HCC)   Pleural effusion   Emphysema, unspecified (HCC)   Acute on chronic diastolic CHF (congestive heart failure) (HCC)   Benign essential hypertension   Hypothyroidism   Constipation    Assessment and Plan: Pleural effusion Patient on nocturnal oxygen since November '23  in setting of emphysema. At presentation hypoxemic withRA O2 sat 84%. CXR and CTA reveal large bilateral pleural effusions with associated collapse of lower lobes. Decompensation HFpEF cannot be ruled out - BNP ordered and pending.   Plan Obs admit  IR consultation for guided thoracentesis to relieve hypoxemia/atelectasis  Multiple myeloma not having achieved remission ([place]) Followe by Dr. Janese Banks - last visit 10/11/22. Treatment currently on hold. At that visit WBC 0.5, Hgb 7.0g. At admission WBC 1.7, Hgb 8.6  Plan Continue augmentin as Rx'd by Dr. Janese Banks  Acute on chronic diastolic CHF (congestive heart failure) ([place]) Last ECHO 07/21/22 Grade II DD, moderated MoV and AoV stenosis. Last  admission 08/03/22 for decompensated HFpEF. Now with hypoxemia most likely due to pleural effusions. She does have pedal edema. BNP pending.  Plan No indication for telemetry monitoring  Continue all cardiac meds including lasix qOD unless BNP elevated   Emphysema, unspecified (HCC) Patient has no pulmonary meds/inhalers on her med list. She has no wheezing or increased WOB on exam. Hypoxemia likely due to pleural effusion.  Plan Continue supplemental oxygen  Pulse ox reading with vitals  SABA if needed wheezing    Constipation Patient reports chronic constipation but denies abdominal pain or discomfort  Plan [place] daily  Hypothyroidism Last TSH 12/29/21 1.87  Plan Continue home dose levothyroxine  TSH with dose adjustments as indicated  Benign essential hypertension BP mildly elevated in ED.  Plan Continue home regimen       DVT prophylaxis: SQ Heparin Code Status: DNR/DNI(Do NOT Intubate) Family Communication: daughter present during interview.   Disposition Plan: home 24-48 hr  Consults called: IR for thoracentesis  Admission status: Observation, Med-Surg   Adella Hare, MD [facility] 10/15/2022, 8:16 PM

## 2022-10-15 NOTE — Subjective & Objective (Signed)
Ms. Ingram, a 89 y/o woman with medical h/o emphysema, hypothroidism, HTN, HFpEF and multiple myeloma. Since her last hospitalization in November '23 she has been on nocturnal oxygen supplementation at 1.5L [place]. For several weeks prior to admission she has had increased weakness and fatigue. At her last oncology visit 10/11/22 she had marked leukopenia and leukemia 2/2 chemotherapy. Her treatments were put on hold, she was started on prophylactic abx and received 1uPRBCs on 10/12/22.  On the day of admission patient had a 4 hour nosebleed along with increased SOB. EMT was called and detected hypoxemia with O2 sat < 90%. Patient was transported to [facility]ED for further evaluation.

## 2022-10-15 NOTE — ED Notes (Signed)
1 set of BC and blood work has been collected at this time

## 2022-10-15 NOTE — Assessment & Plan Note (Signed)
Last ECHO 07/21/22 Grade II DD, moderated MoV and AoV stenosis. Last admission 08/03/22 for decompensated HFpEF. Now with hypoxemia most likely due to pleural effusions. She does have pedal edema. BNP pending.  Plan No indication for telemetry monitoring  Continue all cardiac meds including lasix qOD unless BNP elevated

## 2022-10-15 NOTE — Assessment & Plan Note (Signed)
Followe by Dr. Janese Banks - last visit 10/11/22. Treatment currently on hold. At that visit WBC 0.5, Hgb 7.0g. At admission WBC 1.7, Hgb 8.6  Plan Continue augmentin as Rx'd by Dr. Janese Banks

## 2022-10-15 NOTE — ED Notes (Signed)
Pt removed from oxygen to assess baseline per MD.

## 2022-10-15 NOTE — ED Triage Notes (Signed)
Pt comes by EMS from home for weakness and nosebleed. Family orginally called EMS for nosebleed that has been bleeding for the past 4 hours. By the time EMS arrived bleeding had stopped. Pt denies being on blood thinners. EMS stated that pt was + for sepsis alert. Pt has had weakness for the past 4 days. Pt has a HX of cancer and last treatment was on 10/12/22. EMS noticed a drop in BP when pt stood up(BP 100/60) making her + for orthostatics. Pt is A&Ox4 during triage. Pt is currently on abx for a UTI. Pt is normally on RA (92% on RA). EMS placed 2L [place] (100%).

## 2022-10-15 NOTE — ED Notes (Signed)
Pt ambulated to toilet with assistance.

## 2022-10-15 NOTE — ED Notes (Signed)
Pt has maintained sat of 94% on RA.

## 2022-10-15 NOTE — Assessment & Plan Note (Signed)
Patient has no pulmonary meds/inhalers on her med list. She has no wheezing or increased WOB on exam. Hypoxemia likely due to pleural effusion.  Plan Continue supplemental oxygen  Pulse ox reading with vitals  SABA if needed wheezing

## 2022-10-15 NOTE — Assessment & Plan Note (Addendum)
Patient on nocturnal oxygen since November '23  in setting of emphysema. At presentation hypoxemic withRA O2 sat 84%. CXR and CTA reveal large bilateral pleural effusions with associated collapse of lower lobes. Decompensation HFpEF cannot be ruled out - BNP ordered and pending.   Plan Obs admit  IR consultation for guided thoracentesis to relieve hypoxemia/atelectasis

## 2022-10-15 NOTE — ED Provider Notes (Signed)
[facility] Provider Note    Event Date/Time   First MD Initiated Contact with Patient 10/15/22 1315     (approximate)   History   Epistaxis and Weakness   HPI  Deanna White is a 89 y.o. female  who presents to the emergency department today because of concern for nosebleed. She states it started when she woke up. It has been going on and off for about four hours. She says she has had nosebleeds in the past but none that have lasted this long. The patient does state that she has been feeling fatigued over the past few days. Does have history of MM and had blood transfusion 3 days ago. She denies any fevers.    Physical Exam   Triage Vital Signs: ED Triage Vitals  Enc Vitals Group     BP 10/15/22 1323 (!) 133/51     Pulse Rate 10/15/22 1323 90     Resp 10/15/22 1323 20     Temp 10/15/22 1323 97.7 F (36.5 C)     Temp Source 10/15/22 1323 Oral     SpO2 10/15/22 1316 100 %     Weight 10/15/22 1324 112 lb 10.5 oz (51.1 kg)     Height 10/15/22 1324 '5\' 2"'$  (1.575 m)     Head Circumference --      Peak Flow --      Pain Score 10/15/22 1324 0     Pain Loc --      Pain Edu? --      Excl. in [place]? --     Most recent vital signs: Vitals:   10/15/22 1321 10/15/22 1323  BP:  (!) 133/51  Pulse:  90  Resp:  20  Temp:  97.7 F (36.5 C)  SpO2: 100% 100%   General: Awake, alert, oriented. CV:  Good peripheral perfusion. Regular rate and rhythm. Resp:  Normal effort. Lungs clear. Abd:  No distention.     ED Results / Procedures / Treatments   Labs (all labs ordered are listed, but only abnormal results are displayed) Labs Reviewed  CBC WITH DIFFERENTIAL/PLATELET - Abnormal; Notable for the following components:      Result Value   WBC 1.7 (*)    RBC 2.65 (*)    Hemoglobin 8.6 (*)    HCT 26.7 (*)    MCV 100.8 (*)    RDW 18.7 (*)    Platelets 77 (*)    Neutro Abs 0.5 (*)    Lymphs Abs 0.4 (*)    Monocytes Absolute 0.0 (*)    Eosinophils  Absolute 0.8 (*)    All other components within normal limits  BASIC METABOLIC PANEL - Abnormal; Notable for the following components:   Sodium 133 (*)    CO2 20 (*)    Glucose, Bld 149 (*)    Calcium 8.1 (*)    All other components within normal limits  URINALYSIS, ROUTINE W REFLEX MICROSCOPIC - Abnormal; Notable for the following components:   Color, Urine YELLOW (*)    APPearance CLEAR (*)    Protein, ur 100 (*)    All other components within normal limits  RESP PANEL BY RT-PCR (RSV, FLU A&B, COVID)  [id number]  PATHOLOGIST SMEAR REVIEW  TYPE AND SCREEN     EKG  None   RADIOLOGY I independently interpreted and visualized the CXR. My interpretation: left sided pleural effusion Radiology interpretation:  IMPRESSION:  1. Small left pleural effusion. Trace right pleural effusion.  2. Hazy bibasilar lung opacities, favor atelectasis.  3. Stable mild cardiomegaly without overt pulmonary edema.      PROCEDURES:  Critical Care performed: No  Procedures   MEDICATIONS ORDERED IN ED: Medications - No data to display   IMPRESSION / MDM / [place] / ED COURSE  I reviewed the triage vital signs and the nursing notes.                              Differential diagnosis includes, but is not limited to, pneumonia, covid, UTI.  Patient's presentation is most consistent with acute presentation with potential threat to life or bodily function.  Patient presents to the emergency department today because of concern for nose bleed. At the time of my exam patient is no longer bleeding. Additionally she states she is having some weakness and was slightly hypoxic. Blood work does show known leukopenia.  UA without findings concerning for infection. Given hypoxia will obtain CT angio PE.   FINAL CLINICAL IMPRESSION(S) / ED DIAGNOSES   Nose bleed    Note:  This document was prepared using Dragon voice recognition software and may include unintentional dictation  errors.    Nance Pear, MD 10/15/22 1540

## 2022-10-15 NOTE — Assessment & Plan Note (Signed)
BP mildly elevated in ED.  Plan Continue home regimen

## 2022-10-15 NOTE — ED Provider Notes (Signed)
-----------------------------------------   7:01 PM on 10/15/2022 ----------------------------------------- Patient care assumed from Dr. Archie Balboa.  Patient CTA is negative for PE but does show large bilateral pleural effusions.  Patient is hypoxic 85% with a good waveform on room air.  Given these large pleural effusions I discussed with the patient and daughter admission for possible thoracentesis.  They are agreeable to this plan.  We had a long discussion regarding care goals hospice or palliative involvement.  Patient would like to involve palliative care during her admission as well.  I do believe the patient would benefit from thoracentesis and possibly with increase patient's quality of life even if she did enter hospice or palliative care.  We will admit to the hospital service for further treatment.   Harvest Dark, MD 10/15/22 1902

## 2022-10-15 NOTE — ED Notes (Signed)
Pt refused purewick and wanted to walk to toilet. Pt ambulated with one person assist and then back to bed.

## 2022-10-15 NOTE — Assessment & Plan Note (Signed)
Patient reports chronic constipation but denies abdominal pain or discomfort  Plan [place] daily

## 2022-10-16 ENCOUNTER — Encounter: Payer: Self-pay | Admitting: Internal Medicine

## 2022-10-16 ENCOUNTER — Encounter: Payer: Self-pay | Admitting: Oncology

## 2022-10-16 ENCOUNTER — Observation Stay: Payer: Medicare PPO | Admitting: Radiology

## 2022-10-16 ENCOUNTER — Other Ambulatory Visit: Payer: Self-pay

## 2022-10-16 DIAGNOSIS — Z8249 Family history of ischemic heart disease and other diseases of the circulatory system: Secondary | ICD-10-CM | POA: Diagnosis not present

## 2022-10-16 DIAGNOSIS — J439 Emphysema, unspecified: Secondary | ICD-10-CM | POA: Diagnosis present

## 2022-10-16 DIAGNOSIS — C9000 Multiple myeloma not having achieved remission: Secondary | ICD-10-CM | POA: Diagnosis present

## 2022-10-16 DIAGNOSIS — Z841 Family history of disorders of kidney and ureter: Secondary | ICD-10-CM | POA: Diagnosis not present

## 2022-10-16 DIAGNOSIS — E039 Hypothyroidism, unspecified: Secondary | ICD-10-CM | POA: Diagnosis present

## 2022-10-16 DIAGNOSIS — Z820 Family history of epilepsy and other diseases of the nervous system: Secondary | ICD-10-CM | POA: Diagnosis not present

## 2022-10-16 DIAGNOSIS — Z90710 Acquired absence of both cervix and uterus: Secondary | ICD-10-CM | POA: Diagnosis not present

## 2022-10-16 DIAGNOSIS — Z85828 Personal history of other malignant neoplasm of skin: Secondary | ICD-10-CM | POA: Diagnosis not present

## 2022-10-16 DIAGNOSIS — Z79899 Other long term (current) drug therapy: Secondary | ICD-10-CM | POA: Diagnosis not present

## 2022-10-16 DIAGNOSIS — I739 Peripheral vascular disease, unspecified: Secondary | ICD-10-CM | POA: Diagnosis present

## 2022-10-16 DIAGNOSIS — J90 Pleural effusion, not elsewhere classified: Secondary | ICD-10-CM | POA: Diagnosis not present

## 2022-10-16 DIAGNOSIS — I110 Hypertensive heart disease with heart failure: Secondary | ICD-10-CM | POA: Diagnosis present

## 2022-10-16 DIAGNOSIS — Z7902 Long term (current) use of antithrombotics/antiplatelets: Secondary | ICD-10-CM | POA: Diagnosis not present

## 2022-10-16 DIAGNOSIS — Z801 Family history of malignant neoplasm of trachea, bronchus and lung: Secondary | ICD-10-CM | POA: Diagnosis not present

## 2022-10-16 DIAGNOSIS — Z8051 Family history of malignant neoplasm of kidney: Secondary | ICD-10-CM | POA: Diagnosis not present

## 2022-10-16 DIAGNOSIS — I350 Nonrheumatic aortic (valve) stenosis: Secondary | ICD-10-CM | POA: Diagnosis present

## 2022-10-16 DIAGNOSIS — Z803 Family history of malignant neoplasm of breast: Secondary | ICD-10-CM | POA: Diagnosis not present

## 2022-10-16 DIAGNOSIS — K5909 Other constipation: Secondary | ICD-10-CM | POA: Diagnosis present

## 2022-10-16 DIAGNOSIS — I5033 Acute on chronic diastolic (congestive) heart failure: Secondary | ICD-10-CM | POA: Diagnosis present

## 2022-10-16 DIAGNOSIS — Z7982 Long term (current) use of aspirin: Secondary | ICD-10-CM | POA: Diagnosis not present

## 2022-10-16 DIAGNOSIS — D61810 Antineoplastic chemotherapy induced pancytopenia: Secondary | ICD-10-CM | POA: Diagnosis present

## 2022-10-16 DIAGNOSIS — Z79890 Hormone replacement therapy (postmenopausal): Secondary | ICD-10-CM | POA: Diagnosis not present

## 2022-10-16 DIAGNOSIS — Z1152 Encounter for screening for COVID-19: Secondary | ICD-10-CM | POA: Diagnosis not present

## 2022-10-16 DIAGNOSIS — J9621 Acute and chronic respiratory failure with hypoxia: Secondary | ICD-10-CM | POA: Diagnosis present

## 2022-10-16 DIAGNOSIS — Z66 Do not resuscitate: Secondary | ICD-10-CM | POA: Diagnosis present

## 2022-10-16 LAB — MULTIPLE MYELOMA PANEL, SERUM
Albumin SerPl Elph-Mcnc: 2.9 g/dL (ref 2.9–4.4)
Albumin/Glob SerPl: 1.5 (ref 0.7–1.7)
Alpha 1: 0.4 g/dL (ref 0.0–0.4)
Alpha2 Glob SerPl Elph-Mcnc: 0.7 g/dL (ref 0.4–1.0)
B-Globulin SerPl Elph-Mcnc: 0.7 g/dL (ref 0.7–1.3)
Gamma Glob SerPl Elph-Mcnc: 0.3 g/dL — ABNORMAL LOW (ref 0.4–1.8)
Globulin, Total: 2 g/dL — ABNORMAL LOW (ref 2.2–3.9)
IgA: 27 mg/dL — ABNORMAL LOW (ref 64–422)
IgG (Immunoglobin G), Serum: 277 mg/dL — ABNORMAL LOW (ref 586–1602)
IgM (Immunoglobulin M), Srm: 37 mg/dL (ref 26–217)
Total Protein ELP: 4.9 g/dL — ABNORMAL LOW (ref 6.0–8.5)

## 2022-10-16 LAB — PATHOLOGIST SMEAR REVIEW

## 2022-10-16 MED ORDER — LIDOCAINE HCL 1 % IJ SOLN
INTRAMUSCULAR | Status: AC
  Filled 2022-10-16: qty 20

## 2022-10-16 NOTE — ED Notes (Signed)
Pt placed in hospital bed for comfort.

## 2022-10-16 NOTE — Progress Notes (Signed)
Patient was brought to IR for ultrasound-guided thoracentesis d/t bilateral pleural effusions. Upon examination with ultrasound, no safely accessible fluid pocket was able to be visualized on either side due to involvement of lung tissue. No thoracentesis was attempted today. IR can reevaluate patient if symptoms worsen or if symptoms continue without improvement.  Please contact IR with any questions or concerns.  Lura Em, PA-C 10/16/2022 11:27 AM

## 2022-10-16 NOTE — Progress Notes (Signed)
[facility] PROGRESS NOTE  RADIANCE DEADY QQV:[id number] DOB: 08-27-1925 DOA: 10/15/2022 PCP: Crecencio Mc, MD  Status is: Inpatient  The patient will require care spanning > 2 midnights and should be moved to inpatient because: Pleural effusions, shortness of breath  Barriers to discharge: Social:  Clinical:  Level of care: Med-Surg   Code Status: DNR Family Communication: Daughter DVT prophylaxis:  COVID vaccination status:  Foley catheter: POA: Daughter Date inserted:  HPI:  Subjective:   Objective: Vitals:   10/16/22 0600 10/16/22 0612  BP: (!) 122/56 (!) 122/56  Pulse: 69 75  Resp: 12 13  Temp:    SpO2: 90% 97%    Intake/Output Summary (Last 24 hours) at 10/16/2022 0933 Last data filed at 10/15/2022 1316 Gross per 24 hour  Intake 50 ml  Output --  Net 50 ml   Filed Weights   10/15/22 1324  Weight: 51.1 kg    Exam:  Constitutional: NAD, calm, comfortable Eyes: PERRL, lids and conjunctivae normal ENMT: Mucous membranes are moist. Posterior pharynx clear of any exudate or lesions.Normal dentition.  Neck: normal, supple, no masses, no thyromegaly Respiratory: clear to auscultation bilaterally, no wheezing, no crackles. Normal respiratory effort. No accessory muscle use.  Cardiovascular: Regular rate and rhythm, no murmurs / rubs / gallops. No extremity edema. 2+ pedal pulses. No carotid bruits.  Abdomen: no tenderness, no masses palpated. No hepatosplenomegaly. Bowel sounds positive.  Musculoskeletal: no clubbing / cyanosis. No joint deformity upper and lower extremities. Good ROM, no contractures. Normal muscle tone.  Skin: no rashes, lesions, ulcers. No induration Neurologic: CN 2-12 grossly intact. Sensation intact, DTR normal. Strength 5/5 x all 4 extremities.  Psychiatric: Normal judgment and insight. Alert and oriented x 3. Normal mood.    Assessment/Plan:  89 year old female with history of emphysema, hypothyroidism, hypertension,  HFpEF, multiple myeloma,  chronic respiratory failure on 1.5 L of oxygen at night, has been experiencing increased weakness, fatigue for the last few weeks.  At her last visit to Oncology on 10/11/2022 was found to have marked leukopenia  due to chemotherapy.  Treatments have been on hold, has been on prophylactic antibiotics also required 1 unit of packed RBC on 10/12/2022.  Patient came to the emergency department with complaints of nose bleed with increased shortness of breath.  Patient was found to have oxygen saturations less than 90s.  Workup in the emergency department patient is found to have WBC 1.7, hemoglobin 8.6, platelet count 77.  Chest x-ray showed small left pleural effusion.  CTA chest negative for PE.  Showed large bilateral pleural effusions and atelectasis.    Bilateral pleural effusions large - DD X malignant, congestive heart failure in the setting of aortic stenosis, MR -thoracentesis -send fluid for analysis, including cytology-unsuccessful due to loculated effusion -echocardiogram done from August 2023 showed preserved left ventricular function. -Continue with the diuresis for now  Acute on chronic hypoxic respiratory failure -secondary to large pleural effusions, atelectasis - continue with the thoracentesis and follow-up  -continue with oxygen through nasal cannula.    Nose bleeds  - be from thrombocytopenia -get PT PTT INR looking into any correctable coagulation.   - consult Heme-Onc. -No further episodes  Pancytopenia -chemo induced - closely follow up with CBC.    Multiple myeloma -consult Heme-Onc  Emphysema  - continue with duo nebs as needed.    Constipation -continue with the scheduled senna, MiraLax as needed.    Hypothyroidism - check TSH -continue with Synthroid.    Hypertension -  continue home regimen for now, closely monitor if needed any correction   Other problems:    Data Reviewed: Basic Metabolic Panel: Recent Labs  Lab  10/11/22 1234 10/15/22 1332  NA 128* 133*  K 4.6 3.7  CL 98 106  CO2 22 20*  GLUCOSE 136* 149*  BUN 22 15  CREATININE 1.09* 0.88  CALCIUM 8.2* 8.1*   Liver Function Tests: Recent Labs  Lab 10/11/22 1234  AST 20  ALT 11  ALKPHOS 86  BILITOT 0.2*  PROT 5.1*  ALBUMIN 2.8*   No results for input(s): "LIPASE", "AMYLASE" in the last 168 hours. No results for input(s): "AMMONIA" in the last 168 hours. CBC: Recent Labs  Lab 10/11/22 1234 10/15/22 1332  WBC 0.5* 1.7*  NEUTROABS 0.2* 0.5*  HGB 7.0* 8.6*  HCT 21.3* 26.7*  MCV 102.9* 100.8*  PLT 60* 77*   Cardiac Enzymes: No results for input(s): "CKTOTAL", "CKMB", "CKMBINDEX", "TROPONINI" in the last 168 hours. BNP (last 3 results) Recent Labs    08/03/22 1439 10/15/22 1332  BNP 708.7* 476.0*    ProBNP (last 3 results) No results for input(s): "PROBNP" in the last 8760 hours.  CBG: No results for input(s): "GLUCAP" in the last 168 hours.  Recent Results (from the past 240 hour(s))  Resp panel by RT-PCR (RSV, Flu A&B, Covid) Anterior Nasal Swab     Status: None   Collection Time: 10/15/22  1:32 PM   Specimen: Anterior Nasal Swab  Result Value Ref Range Status   SARS Coronavirus 2 by RT PCR NEGATIVE NEGATIVE Final    Comment: (NOTE) SARS-CoV-2 target nucleic acids are NOT DETECTED.  The SARS-CoV-2 RNA is generally detectable in upper respiratory specimens during the acute phase of infection. The lowest concentration of SARS-CoV-2 viral copies this assay can detect is 138 copies/mL. A negative result does not preclude SARS-Cov-2 infection and should not be used as the sole basis for treatment or other patient management decisions. A negative result may occur with  improper specimen collection/handling, submission of specimen other than nasopharyngeal swab, presence of viral mutation(s) within the areas targeted by this assay, and inadequate number of viral copies(<138 copies/mL). A negative result must be  combined with clinical observations, patient history, and epidemiological information. The expected result is Negative.  Fact Sheet for Patients:  EntrepreneurPulse.com.au  Fact Sheet for Healthcare Providers:  IncredibleEmployment.be  This test is no t yet approved or cleared by the [place] FDA and  has been authorized for detection and/or diagnosis of SARS-CoV-2 by FDA under an Emergency Use Authorization (EUA). This EUA will remain  in effect (meaning this test can be used) for the duration of the COVID-19 declaration under Section 564(b)(1) of the Act, 21 U.S.C.section 360bbb-3(b)(1), unless the authorization is terminated  or revoked sooner.       Influenza A by PCR NEGATIVE NEGATIVE Final   Influenza B by PCR NEGATIVE NEGATIVE Final    Comment: (NOTE) The Xpert Xpress SARS-CoV-2/FLU/RSV plus assay is intended as an aid in the diagnosis of influenza from Nasopharyngeal swab specimens and should not be used as a sole basis for treatment. Nasal washings and aspirates are unacceptable for Xpert Xpress SARS-CoV-2/FLU/RSV testing.  Fact Sheet for Patients: EntrepreneurPulse.com.au  Fact Sheet for Healthcare Providers: IncredibleEmployment.be  This test is not yet approved or cleared by the [place] FDA and has been authorized for detection and/or diagnosis of SARS-CoV-2 by FDA under an Emergency Use Authorization (EUA). This EUA will remain in effect (meaning this  test can be used) for the duration of the COVID-19 declaration under Section 564(b)(1) of the Act, 21 U.S.C. section 360bbb-3(b)(1), unless the authorization is terminated or revoked.     Resp Syncytial Virus by PCR NEGATIVE NEGATIVE Final    Comment: (NOTE) Fact Sheet for Patients: EntrepreneurPulse.com.au  Fact Sheet for Healthcare Providers: IncredibleEmployment.be  This test is not yet  approved or cleared by the [place] FDA and has been authorized for detection and/or diagnosis of SARS-CoV-2 by FDA under an Emergency Use Authorization (EUA). This EUA will remain in effect (meaning this test can be used) for the duration of the COVID-19 declaration under Section 564(b)(1) of the Act, 21 U.S.C. section 360bbb-3(b)(1), unless the authorization is terminated or revoked.  Performed at [facility], [address], [place], [place] [address]      Studies: CT Angio Chest PE W and/or Wo Contrast  Result Date: 10/15/2022 CLINICAL DATA:  Hypoxia.  Nose bleeds.  Weakness. EXAM: CT ANGIOGRAPHY CHEST WITH CONTRAST TECHNIQUE: Multidetector CT imaging of the chest was performed using the standard protocol during bolus administration of intravenous contrast. Multiplanar CT image reconstructions and MIPs were obtained to evaluate the vascular anatomy. RADIATION DOSE REDUCTION: This exam was performed according to the departmental dose-optimization program which includes automated exposure control, adjustment of the mA and/or kV according to patient size and/or use of iterative reconstruction technique. CONTRAST:  40m OMNIPAQUE IOHEXOL 350 MG/ML SOLN COMPARISON:  One-view chest x-ray 10/15/2022. PET scan 08/23/2022. CT of the abdomen and pelvis 08/03/2022 FINDINGS: Cardiovascular: The heart size is mildly enlarged. Coronary artery calcifications are present. Calcifications are present at the aortic valve and aortic arch. No focal stenosis or aneurysm is present. Calcifications are present in the descending thoracic aorta. Pulmonary artery opacification is excellent. No focal filling defects are present to suggest pulmonary embolus. Pulmonary artery size is normal. Mediastinum/Nodes: No enlarged mediastinal, hilar, or axillary lymph nodes. Thyroid gland, trachea, and esophagus demonstrate no significant findings. Lungs/Pleura: Large pleural effusions are present bilaterally. Partial  collapse of both lower lobes noted. No central obstructing lesion is present. Patchy airspace opacity is separate from the collapse in the right lower lobe. No other focal airspace disease is present. No nodule or mass lesion is present. The airways are patent. Upper Abdomen: Atherosclerotic calcifications are present in the aorta. Visualized upper abdomen is otherwise unremarkable. Musculoskeletal: Further collapse of the L1 compression fracture noted. The body heights are otherwise maintained. Review of the MIP images confirms the above findings. IMPRESSION: 1. No pulmonary embolus. 2. Large pleural effusions bilaterally with partial collapse of both lower lobes. 3. Patchy airspace opacity is separate from the collapse in the right lower lobe. This likely represents atelectasis. Infection is not excluded. 4. Further collapse of the L1 compression fracture. 5. Coronary artery disease. 6.  Aortic Atherosclerosis (ICD10-I70.0). Electronically Signed   By: CSan MorelleM.D.   On: 10/15/2022 16:43   DG Chest Portable 1 View  Result Date: 10/15/2022 CLINICAL DATA:  Hypoxia EXAM: PORTABLE CHEST 1 VIEW COMPARISON:  08/08/2022 chest radiograph. FINDINGS: Stable cardiomediastinal silhouette with mild cardiomegaly. No pneumothorax. Small left pleural effusion. Trace right pleural effusion. No overt pulmonary edema. Hazy bibasilar lung opacities. IMPRESSION: 1. Small left pleural effusion. Trace right pleural effusion. 2. Hazy bibasilar lung opacities, favor atelectasis. 3. Stable mild cardiomegaly without overt pulmonary edema. Electronically Signed   By: JIlona SorrelM.D.   On: 10/15/2022 14:08    Scheduled Meds:  amLODipine  2.5-5 mg Oral BID  amoxicillin-clavulanate  1 tablet Oral BID   cloNIDine  0.1-0.2 mg Oral BH-qamhs   clopidogrel  75 mg Oral Daily   furosemide  20 mg Oral QODAY   heparin  5,000 Units Subcutaneous Q8H   levothyroxine  75 mcg Oral Daily   losartan  100 mg Oral Daily    metoprolol tartrate  25 mg Oral Daily   potassium chloride  20 mEq Oral QODAY   senna  1 tablet Oral BID   sodium chloride flush  3 mL Intravenous Q12H   Continuous Infusions:  sodium chloride     sodium chloride      Principal Problem:   Pleural effusion, bilateral Active Problems:   Multiple myeloma not having achieved remission (HCC)   Pleural effusion   Emphysema, unspecified (HCC)   Acute on chronic diastolic CHF (congestive heart failure) ([place])   Benign essential hypertension   Hypothyroidism   Constipation   Consultants:   Heme-Onc IR  Procedures:  thoracentesis  Antibiotics:  (indicate start date, and stop date if known)   Time spent: 40 min    Alecxis Baltzell ANP  [facility] 7 am - 330 pm/M-F for direct patient care and secure chat Please refer to Amion for contact info 0  days

## 2022-10-17 ENCOUNTER — Other Ambulatory Visit: Payer: Self-pay | Admitting: Oncology

## 2022-10-17 DIAGNOSIS — J90 Pleural effusion, not elsewhere classified: Secondary | ICD-10-CM | POA: Diagnosis not present

## 2022-10-17 DIAGNOSIS — C9000 Multiple myeloma not having achieved remission: Secondary | ICD-10-CM

## 2022-10-17 LAB — CBC WITH DIFFERENTIAL/PLATELET
Abs Immature Granulocytes: 0.03 10*3/uL (ref 0.00–0.07)
Basophils Absolute: 0 10*3/uL (ref 0.0–0.1)
Basophils Relative: 0 %
Eosinophils Absolute: 0.3 10*3/uL (ref 0.0–0.5)
Eosinophils Relative: 21 %
HCT: 23.9 % — ABNORMAL LOW (ref 36.0–46.0)
Hemoglobin: 8.1 g/dL — ABNORMAL LOW (ref 12.0–15.0)
Immature Granulocytes: 2 %
Lymphocytes Relative: 28 %
Lymphs Abs: 0.4 10*3/uL — ABNORMAL LOW (ref 0.7–4.0)
MCH: 33.8 pg (ref 26.0–34.0)
MCHC: 33.9 g/dL (ref 30.0–36.0)
MCV: 99.6 fL (ref 80.0–100.0)
Monocytes Absolute: 0 10*3/uL — ABNORMAL LOW (ref 0.1–1.0)
Monocytes Relative: 2 %
Neutro Abs: 0.7 10*3/uL — ABNORMAL LOW (ref 1.7–7.7)
Neutrophils Relative %: 47 %
Platelets: 86 10*3/uL — ABNORMAL LOW (ref 150–400)
RBC: 2.4 MIL/uL — ABNORMAL LOW (ref 3.87–5.11)
RDW: 18.8 % — ABNORMAL HIGH (ref 11.5–15.5)
Smear Review: NORMAL
WBC: 1.5 10*3/uL — ABNORMAL LOW (ref 4.0–10.5)
nRBC: 0 % (ref 0.0–0.2)

## 2022-10-17 LAB — BASIC METABOLIC PANEL
Anion gap: 5 (ref 5–15)
BUN: 14 mg/dL (ref 8–23)
CO2: 24 mmol/L (ref 22–32)
Calcium: 8.4 mg/dL — ABNORMAL LOW (ref 8.9–10.3)
Chloride: 104 mmol/L (ref 98–111)
Creatinine, Ser: 0.86 mg/dL (ref 0.44–1.00)
GFR, Estimated: >60 mL/min (ref >60)
Glucose, Bld: 117 mg/dL — ABNORMAL HIGH (ref 70–99)
Potassium: 3.9 mmol/L (ref 3.5–5.1)
Sodium: 133 mmol/L — ABNORMAL LOW (ref 135–145)

## 2022-10-17 LAB — MAGNESIUM: Magnesium: 2 mg/dL (ref 1.7–2.4)

## 2022-10-17 MED ORDER — BLISTEX MEDICATED EX OINT
TOPICAL_OINTMENT | CUTANEOUS | Status: DC | PRN
  Filled 2022-10-17: qty 6.3

## 2022-10-17 NOTE — Progress Notes (Signed)
   10/17/22 1400  Spiritual Encounters  Type of Visit Initial  Care provided to: Pt and family  Referral source Chaplain team  Reason for visit Routine spiritual support  OnCall Visit No   Chaplain met with patient and daughter. Chaplain provided compassionate presence and reflective listening as patient and daughter spoke about health challenges. Patient was tired but engaging. Chaplain services are available for follow up as needed.

## 2022-10-17 NOTE — TOC Initial Note (Addendum)
Transition of Care [facility]) - Initial/Assessment Note    Patient Details  Name: Deanna White MRN: [id number] Date of Birth: [birth year]  Transition of Care [facility]) CM/SW Contact:    Gerilyn Pilgrim, LCSW Phone Number: 10/17/2022, 1:54 PM  Clinical Narrative:  Pt active with [facility]. HH ordered for PT/OT/RN/AID. Georgina Snell with [facility] notified. Pt has oxygen with [employer]. Palliative notified that pt needs outpatient palliative care. SW spoke with son who choses [facility] for palliative care. [facility] notified.                      Patient Goals and CMS Choice            Expected Discharge Plan and Services                                              Prior Living Arrangements/Services                       Activities of Daily Living Home Assistive Devices/Equipment: Cane (specify quad or straight), Eyeglasses, Hearing aid, Oxygen ADL Screening (condition at time of admission) Patient's cognitive ability adequate to safely complete daily activities?: Yes Is the patient deaf or have difficulty hearing?: No Does the patient have difficulty seeing, even when wearing glasses/contacts?: No Does the patient have difficulty concentrating, remembering, or making decisions?: No Patient able to express need for assistance with ADLs?: Yes Does the patient have difficulty dressing or bathing?: No Independently performs ADLs?: Yes (appropriate for developmental age) Does the patient have difficulty walking or climbing stairs?: Yes Weakness of Legs: Both Weakness of Arms/Hands: None  Permission Sought/Granted                  Emotional Assessment              Admission diagnosis:  Pleural effusion [J90] Hypoxia [R09.02] Pleural effusion, bilateral [J90] Dyspnea, unspecified type [R06.00] Patient Active Problem List   Diagnosis Date Noted   Pleural effusion 10/15/2022   Pleural effusion, bilateral 10/15/2022   Multiple myeloma not having achieved remission  ([place]) 09/25/2022   Acute hypoxic respiratory failure ([place]) 08/22/2022   Hospital discharge follow-up 08/21/2022   Acute decompensated heart failure ([place]) 08/05/2022   Anemia of chronic disease 08/04/2022   Symptomatic anemia 08/03/2022   Aortic stenosis 08/03/2022   Iron deficiency anemia due to chronic blood loss 08/03/2022   Acute on chronic diastolic CHF (congestive heart failure) ([place]) 08/03/2022   Eustachian tube dysfunction, bilateral 07/30/2022   Osteoarthritis 07/30/2022   Wood splinter in palm of hand 01/22/2022   B12 deficiency 01/22/2022   Cough in adult 12/30/2021   Myalgia due to statin 07/01/2021   Pain due to onychomycosis of toenails of both feet 12/09/2020   Combined forms of age-related cataract of right eye 08/11/2020   Emphysema, unspecified ([place]) 06/10/2020   Aortic atherosclerosis ([place]) 05/12/2020   Chronic pain of left ankle 04/19/2020   Edema of left lower extremity 04/19/2020   Varicella zoster 09/24/2019   Trichiasis of right upper eyelid 11/06/2018   Venous insufficiency of both lower extremities 04/17/2018   Near syncope 01/26/2018   Pseudophakia, left eye 01/16/2018   LVH (left ventricular hypertrophy) due to hypertensive disease, without heart failure 12/03/2017   Moderate mitral insufficiency 04/17/2017   Constipation 03/03/2017   Mixed hyperlipidemia 03/27/2016  Medicare annual wellness visit, subsequent 03/03/2016   Encounter for preventive health examination 03/03/2016   Age-related reticular degeneration of both retinas 12/22/2015   Nonexudative age-related macular degeneration 12/22/2015   Open angle with borderline findings and low glaucoma risk in both eyes 12/22/2015   Bilateral ocular hypertension 12/22/2015   Optic neuropathy, left 12/22/2015   B12 deficiency anemia 02/25/2015   Hypothyroidism 02/23/2015   Cataract (lens) fragments in eye following cataract surgery, left eye 01/20/2015   Senile osteoporosis 02/22/2014   Osteoporosis  of vertebra 02/22/2014   Benign essential hypertension 02/20/2014   PAD (peripheral artery disease) ([place]) 09/15/2011   PCP:  Crecencio Mc, MD Pharmacy:   [id number] [place] [id number]: [phone]: [address] [address] [place]Phone: [phone]: [phone]    Social Determinants of Health (SDOH) Social History: SDOH Screenings   Food Insecurity: No Food Insecurity (10/16/2022)  Housing: Low Risk  (10/16/2022)  Transportation Needs: No Transportation Needs (10/16/2022)  Utilities: Not At Risk (10/16/2022)  Depression (PHQ2-9): Low Risk  (07/28/2022)  Financial Resource Strain: Low Risk  (09/27/2020)  Social Connections: Unknown (09/27/2020)  Stress: No Stress Concern Present (09/27/2020)  Tobacco Use: Low Risk  (10/16/2022)   SDOH Interventions:     Readmission Risk Interventions     No data to display

## 2022-10-17 NOTE — Progress Notes (Signed)
Pt being discharged home, discharge instructions reviewed with pt and daughter, states understanding, pt with no complaints

## 2022-10-17 NOTE — Progress Notes (Signed)
[facility] Liaison Note  Notified by TOC/Darrian of patient/family request of [facility] Paliative services.  [facility] hospital liaison will follow patient for discharge disposition.   Please call with any questions/concerns.    Thank you for the opportunity to participate in this patient's care.   Daphene Calamity, MSW [facility] Liaison  [phone]

## 2022-10-17 NOTE — Progress Notes (Addendum)
SATURATION QUALIFICATIONS: (This note is used to comply with regulatory documentation for home oxygen)  Patient Saturations on Room Air at Rest = 90%  Patient Saturations on Room Air while Ambulating = 83%  Patient Saturations on 2  Liters of oxygen while Ambulating = 88%  Please briefly explain why patient needs home oxygen: Pt needs O2 to maintain O2 saturations with acitivity  Note entered by Ivan Anchors, RN   Cosigned by Enzo Bi, MD

## 2022-10-17 NOTE — TOC Transition Note (Addendum)
Transition of Care [facility]) - CM/SW Discharge Note   Patient Details  Name: Deanna White MRN: [id number] Date of Birth: 12-23-1924  Transition of Care [facility]) CM/SW Contact:  Gerilyn Pilgrim, LCSW Phone Number: 10/17/2022, 3:52 PM   Clinical Narrative:  SW waiting for saturations note and eval for new portable oxygen per [employer]. Per Cyril Mourning pt has enough oxygen to be delivered she has a portable tank and a concentrator. Per Dorann Lodge will deliver tank to hospital room. Home set up will be set up tomorrow. [facility] referral made for palliative care. HH arranged through [facility]. CSW signing off.      Final next level of care: [place] Barriers to Discharge: Barriers Resolved   Patient Goals and CMS Choice   Choice offered to / list presented to : Adult Children  Discharge Placement                         Discharge Plan and Services Additional resources added to the After Visit Summary for                  DME Arranged: Oxygen DME Agency: [employer] Date DME Agency Contacted: 10/17/22 Time DME Agency Contacted: 0200 Representative spoke with at DME Agency: Erasmo Downer HH Arranged: PT, OT, RN [facility] Agency: [place] Date [place]: 10/17/22 Time [place]: 0300    Social Determinants of Health ([place]) Interventions SDOH Screenings   Food Insecurity: No Food Insecurity (10/16/2022)  Housing: Low Risk  (10/16/2022)  Transportation Needs: No Transportation Needs (10/16/2022)  Utilities: Not At Risk (10/16/2022)  Depression (PHQ2-9): Low Risk  (07/28/2022)  Financial Resource Strain: Low Risk  (09/27/2020)  Social Connections: Unknown (09/27/2020)  Stress: No Stress Concern Present (09/27/2020)  Tobacco Use: Low Risk  (10/16/2022)     Readmission Risk Interventions     No data to display

## 2022-10-17 NOTE — Discharge Summary (Signed)
Physician Discharge Summary   Deanna White  female DOB: 1924/10/28  JGG:[id number]  PCP: Crecencio Mc, MD  Admit date: 10/15/2022 Discharge date: 10/17/2022  Admitted From: home Disposition:  home Daughter updated at bedside prior to discharge. CODE STATUS: DNR   Hospital Course:  For full details, please see H&P, progress notes, consult notes and ancillary notes.  Briefly,  Deanna White is a 89 year old female with history of emphysema, hypothyroidism, hypertension, HFpEF, multiple myeloma, chronic respiratory failure on 1.5 L of oxygen at night, has been experiencing increased weakness, fatigue for the last few weeks.    At her last visit to Oncology on 10/11/2022 was found to have marked leukopenia  due to chemotherapy.  Treatments have been on hold, has been on prophylactic antibiotics also required 1 unit of packed RBC on 10/12/2022.  Patient came to the emergency department with complaints of nose bleed with increased shortness of breath.  Patient was found to have oxygen saturations less than 90s.  Workup in the emergency department patient is found to have WBC 1.7, hemoglobin 8.6, platelet count 77.   Acute on chronic hypoxic respiratory failure --pt has multiple comorbidities that are progressive and affecting O2 sats.  Prior to discharge, pt had ambulation test that showed desat down to 83% with ambulating, so pt was discharged on 2L O2.   Bilateral pleural effusions  --CXR showed small left pleural effusion and trace right pleural effusion.  However, CT chest read as Large pleural effusions bilaterally with partial collapse of both lower lobes.  Thoracentesis attempted, however, fluid collection was found to small to tap.    Epistaxis  --bleeding stopped prior to presentation.  Pt does have previous nose bleed from the same right nostril, which was exacerbated by thrombocytopenia.   --advised outpatient ENT f/u if epistaxis continues to be an issue.    Pancytopenia Multiple myeloma -pancytopenia was chemo induced --all cell counts improving prior to discharge.  I messaged oncall oncology, and confirmed there was no need for inpatient oncology consult.  Pt will follow up with outpatient onc Dr. Janese Banks.   Emphysema  --stable   Hypothyroidism -continue with Synthroid.     Hypertension - continue home regimen as below   Unless noted above, medications under "STOP" list are ones pt was not taking PTA.  Discharge Diagnoses:  Principal Problem:   Pleural effusion, bilateral Active Problems:   Multiple myeloma not having achieved remission (HCC)   Pleural effusion   Emphysema, unspecified (HCC)   Acute on chronic diastolic CHF (congestive heart failure) (HCC)   Benign essential hypertension   Hypothyroidism   Constipation   30 Day Unplanned Readmission Risk Score    Flowsheet Row ED to Hosp-Admission (Current) from 10/15/2022 in [facility]  30 Day Unplanned Readmission Risk Score (%) 20.32 Filed at 10/17/2022 1200       This score is the patient's risk of an unplanned readmission within 30 days of being discharged (0 -100%). The score is based on dignosis, age, lab data, medications, orders, and past utilization.   Low:  0-14.9   Medium: 15-21.9   High: 22-29.9   Extreme: 30 and above         Discharge Instructions:  Allergies as of 10/17/2022       Reactions   Hctz [hydrochlorothiazide] Other (See Comments)   Hyponatremia   Adhesive [tape] Rash   Peels skin   Brimonidine Rash, Other (See Comments)   Pt  felt as if throat was closing up, pt also had rash        Medication List     STOP taking these medications    allopurinol 300 MG tablet Commonly known as: ZYLOPRIM   clopidogrel 75 MG tablet Commonly known as: PLAVIX   lenalidomide 2.5 MG capsule Commonly known as: REVLIMID   NEEDLE (DISP) 25 G 25G X 1" Misc       TAKE these medications    amLODipine 5  MG tablet Commonly known as: NORVASC Take 0.5 tab by mouth every AM, and take 1 tab  by mouth every PM   amoxicillin-clavulanate 875-125 MG tablet Commonly known as: AUGMENTIN Take 1 tablet by mouth 2 (two) times daily.   ASPIRIN 81 PO Take 81 mg by mouth daily.   cloNIDine 0.1 MG tablet Commonly known as: CATAPRES 1 tablet in the am and 1 In the evening   dexamethasone 4 MG tablet Commonly known as: DECADRON Take 5 tablets (20 mg total) by mouth once a week. Take with food.   dorzolamide 2 % ophthalmic solution Commonly known as: TRUSOPT Place 1 drop into both eyes 3 (three) times daily.   furosemide 20 MG tablet Commonly known as: LASIX Take 1 tablet (20 mg total) by mouth every other day.   ketoconazole 2 % cream Commonly known as: NIZORAL Apply 1 Application topically at bedtime.   levothyroxine 75 MCG tablet Commonly known as: SYNTHROID TAKE 1 TABLET BY MOUTH EVERY DAY   loratadine 10 MG tablet Commonly known as: CLARITIN Take 10 mg by mouth at bedtime as needed for allergies (For the rash).   losartan 100 MG tablet Commonly known as: COZAAR TAKE 1 TABLET BY MOUTH EVERY DAY   metoprolol tartrate 25 MG tablet Commonly known as: LOPRESSOR Take 0.5 tablets (12.5 mg total) by mouth 2 (two) times daily.   potassium chloride 10 MEQ tablet Commonly known as: KLOR-CON Take 2 tablets (20 mEq total) by mouth every other day.   PreserVision AREDS 2 Caps Take 1 capsule by mouth 2 (two) times daily.               Durable Medical Equipment  (From admission, onward)           Start     Ordered   10/17/22 1452  For home use only DME oxygen  Once       Question Answer Comment  Length of Need 6 Months   Mode or (Route) Nasal cannula   Liters per Minute 3   Frequency Continuous (stationary and portable oxygen unit needed)   Oxygen delivery system Gas      10/17/22 1451             Follow-up Information     Crecencio Mc, MD Follow up in 1  week(s).   Specialty: Internal Medicine Contact information: [address] [place] [address] [phone]                 Allergies  Allergen Reactions   Hctz [Hydrochlorothiazide] Other (See Comments)    Hyponatremia    Adhesive [Tape] Rash    Peels skin   Brimonidine Rash and Other (See Comments)    Pt felt as if throat was closing up, pt also had rash     The results of significant diagnostics from this hospitalization (including imaging, microbiology, ancillary and laboratory) are listed below for reference.   Consultations:   Procedures/Studies: IR US CHEST  Result  Date: 10/16/2022 CLINICAL DATA:  89 year old woman with history of pleural effusion presents to IR for thoracentesis. EXAM: CHEST ULTRASOUND COMPARISON:  CT chest 10/15/2022 FINDINGS: Small bilateral pleural effusions. No safe window could be identified for percutaneous drainage. IMPRESSION: Small bilateral pleural effusions. No safe window could be identified for percutaneous drainage. Electronically Signed   By: Miachel Roux M.D.   On: 10/16/2022 12:36   CT Angio Chest PE W and/or Wo Contrast  Result Date: 10/15/2022 CLINICAL DATA:  Hypoxia.  Nose bleeds.  Weakness. EXAM: CT ANGIOGRAPHY CHEST WITH CONTRAST TECHNIQUE: Multidetector CT imaging of the chest was performed using the standard protocol during bolus administration of intravenous contrast. Multiplanar CT image reconstructions and MIPs were obtained to evaluate the vascular anatomy. RADIATION DOSE REDUCTION: This exam was performed according to the departmental dose-optimization program which includes automated exposure control, adjustment of the mA and/or kV according to patient size and/or use of iterative reconstruction technique. CONTRAST:  67m OMNIPAQUE IOHEXOL 350 MG/ML SOLN COMPARISON:  One-view chest x-ray 10/15/2022. PET scan 08/23/2022. CT of the abdomen and pelvis 08/03/2022 FINDINGS: Cardiovascular: The heart size is  mildly enlarged. Coronary artery calcifications are present. Calcifications are present at the aortic valve and aortic arch. No focal stenosis or aneurysm is present. Calcifications are present in the descending thoracic aorta. Pulmonary artery opacification is excellent. No focal filling defects are present to suggest pulmonary embolus. Pulmonary artery size is normal. Mediastinum/Nodes: No enlarged mediastinal, hilar, or axillary lymph nodes. Thyroid gland, trachea, and esophagus demonstrate no significant findings. Lungs/Pleura: Large pleural effusions are present bilaterally. Partial collapse of both lower lobes noted. No central obstructing lesion is present. Patchy airspace opacity is separate from the collapse in the right lower lobe. No other focal airspace disease is present. No nodule or mass lesion is present. The airways are patent. Upper Abdomen: Atherosclerotic calcifications are present in the aorta. Visualized upper abdomen is otherwise unremarkable. Musculoskeletal: Further collapse of the L1 compression fracture noted. The body heights are otherwise maintained. Review of the MIP images confirms the above findings. IMPRESSION: 1. No pulmonary embolus. 2. Large pleural effusions bilaterally with partial collapse of both lower lobes. 3. Patchy airspace opacity is separate from the collapse in the right lower lobe. This likely represents atelectasis. Infection is not excluded. 4. Further collapse of the L1 compression fracture. 5. Coronary artery disease. 6.  Aortic Atherosclerosis (ICD10-I70.0). Electronically Signed   By: CSan MorelleM.D.   On: 10/15/2022 16:43   DG Chest Portable 1 View  Result Date: 10/15/2022 CLINICAL DATA:  Hypoxia EXAM: PORTABLE CHEST 1 VIEW COMPARISON:  08/08/2022 chest radiograph. FINDINGS: Stable cardiomediastinal silhouette with mild cardiomegaly. No pneumothorax. Small left pleural effusion. Trace right pleural effusion. No overt pulmonary edema. Hazy bibasilar  lung opacities. IMPRESSION: 1. Small left pleural effusion. Trace right pleural effusion. 2. Hazy bibasilar lung opacities, favor atelectasis. 3. Stable mild cardiomegaly without overt pulmonary edema. Electronically Signed   By: JIlona SorrelM.D.   On: 10/15/2022 14:08      Labs: BNP (last 3 results) Recent Labs    08/03/22 1439 10/15/22 1332  BNP 708.7* 4741.2   Basic Metabolic Panel: Recent Labs  Lab 10/11/22 1234 10/15/22 1332 10/17/22 1046  NA 128* 133* 133*  K 4.6 3.7 3.9  CL 98 106 104  CO2 22 20* 24  GLUCOSE 136* 149* 117*  BUN '22 15 14  '$ CREATININE 1.09* 0.88 0.86  CALCIUM 8.2* 8.1* 8.4*  MG  --   --  2.0  Liver Function Tests: Recent Labs  Lab 10/11/22 1234  AST 20  ALT 11  ALKPHOS 86  BILITOT 0.2*  PROT 5.1*  ALBUMIN 2.8*   No results for input(s): "LIPASE", "AMYLASE" in the last 168 hours. No results for input(s): "AMMONIA" in the last 168 hours. CBC: Recent Labs  Lab 10/11/22 1234 10/15/22 1332 10/17/22 1046  WBC 0.5* 1.7* 1.5*  NEUTROABS 0.2* 0.5* 0.7*  HGB 7.0* 8.6* 8.1*  HCT 21.3* 26.7* 23.9*  MCV 102.9* 100.8* 99.6  PLT 60* 77* 86*   Cardiac Enzymes: No results for input(s): "CKTOTAL", "CKMB", "CKMBINDEX", "TROPONINI" in the last 168 hours. BNP: Invalid input(s): "POCBNP" CBG: No results for input(s): "GLUCAP" in the last 168 hours. D-Dimer No results for input(s): "DDIMER" in the last 72 hours. Hgb A1c No results for input(s): "HGBA1C" in the last 72 hours. Lipid Profile No results for input(s): "CHOL", "HDL", "LDLCALC", "TRIG", "CHOLHDL", "LDLDIRECT" in the last 72 hours. Thyroid function studies Recent Labs    10/15/22 1332  TSH 8.122*   Anemia work up No results for input(s): "VITAMINB12", "FOLATE", "FERRITIN", "TIBC", "IRON", "RETICCTPCT" in the last 72 hours. Urinalysis    Component Value Date/Time   COLORURINE YELLOW (A) 10/15/2022 1332   APPEARANCEUR CLEAR (A) 10/15/2022 1332   LABSPEC 1.016 10/15/2022 1332    PHURINE 5.0 10/15/2022 1332   GLUCOSEU NEGATIVE 10/15/2022 1332   HGBUR NEGATIVE 10/15/2022 1332   BILIRUBINUR NEGATIVE 10/15/2022 1332   KETONESUR NEGATIVE 10/15/2022 1332   PROTEINUR 100 (A) 10/15/2022 1332   NITRITE NEGATIVE 10/15/2022 1332   LEUKOCYTESUR NEGATIVE 10/15/2022 1332   Sepsis Labs Recent Labs  Lab 10/11/22 1234 10/15/22 1332 10/17/22 1046  WBC 0.5* 1.7* 1.5*   Microbiology Recent Results (from the past 240 hour(s))  Resp panel by RT-PCR (RSV, Flu A&B, Covid) Anterior Nasal Swab     Status: None   Collection Time: 10/15/22  1:32 PM   Specimen: Anterior Nasal Swab  Result Value Ref Range Status   SARS Coronavirus 2 by RT PCR NEGATIVE NEGATIVE Final    Comment: (NOTE) SARS-CoV-2 target nucleic acids are NOT DETECTED.  The SARS-CoV-2 RNA is generally detectable in upper respiratory specimens during the acute phase of infection. The lowest concentration of SARS-CoV-2 viral copies this assay can detect is 138 copies/mL. A negative result does not preclude SARS-Cov-2 infection and should not be used as the sole basis for treatment or other patient management decisions. A negative result may occur with  improper specimen collection/handling, submission of specimen other than nasopharyngeal swab, presence of viral mutation(s) within the areas targeted by this assay, and inadequate number of viral copies(<138 copies/mL). A negative result must be combined with clinical observations, patient history, and epidemiological information. The expected result is Negative.  Fact Sheet for Patients:  EntrepreneurPulse.com.au  Fact Sheet for Healthcare Providers:  IncredibleEmployment.be  This test is no t yet approved or cleared by the [place] FDA and  has been authorized for detection and/or diagnosis of SARS-CoV-2 by FDA under an Emergency Use Authorization (EUA). This EUA will remain  in effect (meaning this test can be used)  for the duration of the COVID-19 declaration under Section 564(b)(1) of the Act, 21 U.S.C.section 360bbb-3(b)(1), unless the authorization is terminated  or revoked sooner.       Influenza A by PCR NEGATIVE NEGATIVE Final   Influenza B by PCR NEGATIVE NEGATIVE Final    Comment: (NOTE) The Xpert Xpress SARS-CoV-2/FLU/RSV plus assay is intended as an aid in the  diagnosis of influenza from Nasopharyngeal swab specimens and should not be used as a sole basis for treatment. Nasal washings and aspirates are unacceptable for Xpert Xpress SARS-CoV-2/FLU/RSV testing.  Fact Sheet for Patients: EntrepreneurPulse.com.au  Fact Sheet for Healthcare Providers: IncredibleEmployment.be  This test is not yet approved or cleared by the [place] FDA and has been authorized for detection and/or diagnosis of SARS-CoV-2 by FDA under an Emergency Use Authorization (EUA). This EUA will remain in effect (meaning this test can be used) for the duration of the COVID-19 declaration under Section 564(b)(1) of the Act, 21 U.S.C. section 360bbb-3(b)(1), unless the authorization is terminated or revoked.     Resp Syncytial Virus by PCR NEGATIVE NEGATIVE Final    Comment: (NOTE) Fact Sheet for Patients: EntrepreneurPulse.com.au  Fact Sheet for Healthcare Providers: IncredibleEmployment.be  This test is not yet approved or cleared by the [place] FDA and has been authorized for detection and/or diagnosis of SARS-CoV-2 by FDA under an Emergency Use Authorization (EUA). This EUA will remain in effect (meaning this test can be used) for the duration of the COVID-19 declaration under Section 564(b)(1) of the Act, 21 U.S.C. section 360bbb-3(b)(1), unless the authorization is terminated or revoked.  Performed at [facility], [address] [place], [place] [address]      Total time spend on discharging this  patient, including the last patient exam, discussing the hospital stay, instructions for ongoing care as it relates to all pertinent caregivers, as well as preparing the medical discharge records, prescriptions, and/or referrals as applicable, is 45 minutes.    Enzo Bi, MD  [facility] 10/17/2022, 2:54 PM

## 2022-10-17 NOTE — Evaluation (Signed)
Physical Therapy Evaluation Patient Details Name: Deanna White MRN: [id number] DOB: [birth year] Today's Date: 10/17/2022  History of Present Illness  Pt is a 89 y/o woman with PMH that includes: emphysema, hypothroidism, HTN, HFpEF and multiple myeloma, on nocturnal 1.5LO2/min since prior admission 08/2022.  For several weeks prior to admission she has had increased weakness and fatigue. At her last oncology visit 10/11/22 she had marked leukopenia due to chemotherapy. Her treatments were put on hold. On the day of admission patient had a 4 hour nosebleed along with increased SOB and hypoxemia with O2 sat < 90%. Patient was transported to [facility]ED for further evaluation.  MD assessment includes: Bilateral pleural effusions, acute on chronic hypoxic respiratory failure, pancytopenia, and HTN.   Clinical Impression  Pt was pleasant and motivated to participate during the session and put forth good effort throughout. Pt required no physical assistance during the session but did need extra time and effort with functional tasks. Pt was generally steady in standing during transfers and gait with SpO2 dropping to a low of 88% on 2LO2/min after amb 20 feet.  Pt reported no adverse symptoms during the session with dtr present and in agreement of below recommendations.  Pt will benefit from HHPT upon discharge to safely address deficits listed in patient problem list for decreased caregiver assistance and eventual return to PLOF.         Recommendations for follow up therapy are one component of a multi-disciplinary discharge planning process, led by the attending physician.  Recommendations may be updated based on patient status, additional functional criteria and insurance authorization.  Follow Up Recommendations Home health PT      Assistance Recommended at Discharge Frequent or constant Supervision/Assistance  Patient can return home with the following  A little help with walking and/or transfers;A  little help with bathing/dressing/bathroom;Assistance with cooking/housework;Help with stairs or ramp for entrance;Assist for transportation    Equipment Recommendations None recommended by PT  Recommendations for Other Services       Functional Status Assessment Patient has had a recent decline in their functional status and demonstrates the ability to make significant improvements in function in a reasonable and predictable amount of time.     Precautions / Restrictions Precautions Precautions: Fall Restrictions Weight Bearing Restrictions: No      Mobility  Bed Mobility Overal bed mobility: Modified Independent             General bed mobility comments: Increased time and effort only    Transfers Overall transfer level: Needs assistance Equipment used: Rolling walker (2 wheels) Transfers: Sit to/from Stand Sit to Stand: Supervision                Ambulation/Gait Ambulation/Gait assistance: Supervision Gait Distance (Feet): 20 Feet Assistive device: Rolling walker (2 wheels) Gait Pattern/deviations: Step-through pattern, Decreased step length - right, Decreased step length - left Gait velocity: decreased     General Gait Details: Slow cadence with short B step length but steady without LOB  Stairs            Wheelchair Mobility    Modified Rankin (Stroke Patients Only)       Balance Overall balance assessment: Needs assistance   Sitting balance-Leahy Scale: Good     Standing balance support: Bilateral upper extremity supported, During functional activity Standing balance-Leahy Scale: Good  Pertinent Vitals/Pain Pain Assessment Pain Assessment: No/denies pain    Home Living Family/patient expects to be discharged to:: Private residence Living Arrangements: Children Available Help at Discharge: Family;Available 24 hours/day Type of Home: House Home Access: Stairs to enter Entrance  Stairs-Rails: None Entrance Stairs-Number of Steps: 2     Home Equipment: Air cabin crew (4 wheels);Cane - single point      Prior Function Prior Level of Function : Needs assist       Physical Assist : Mobility (physical);ADLs (physical) Mobility (physical): Gait;Stairs ADLs (physical): Bathing;Dressing Mobility Comments: Mod Ind amb with SPC mostly household distances, CGA ascending/descending steps, family assists with driving, no fall history ADLs Comments: Family assists with ADLs     Hand Dominance   Dominant Hand: Right    Extremity/Trunk Assessment   Upper Extremity Assessment Upper Extremity Assessment: Generalized weakness    Lower Extremity Assessment Lower Extremity Assessment: Generalized weakness       Communication   Communication: HOH  Cognition Arousal/Alertness: Awake/alert Behavior During Therapy: WFL for tasks assessed/performed Overall Cognitive Status: Within Functional Limits for tasks assessed                                          General Comments      Exercises Total Joint Exercises Long Arc Quad: AROM, Strengthening, Both, 15 reps Knee Flexion: AROM, Strengthening, Both, 15 reps Marching in Standing: Strengthening, Both, 10 reps, Standing   Assessment/Plan    PT Assessment Patient needs continued PT services  PT Problem List Decreased strength;Decreased activity tolerance;Decreased balance;Decreased mobility;Decreased knowledge of use of DME       PT Treatment Interventions DME instruction;Gait training;Stair training;Functional mobility training;Therapeutic activities;Therapeutic exercise;Balance training;Patient/family education    PT Goals (Current goals can be found in the Care Plan section)  Acute Rehab PT Goals Patient Stated Goal: To get stronger PT Goal Formulation: With patient Time For Goal Achievement: 10/30/22 Potential to Achieve Goals: Good    Frequency Min 2X/week      Co-evaluation               AM-PAC PT "6 Clicks" Mobility  Outcome Measure Help needed turning from your back to your side while in a flat bed without using bedrails?: A Little Help needed moving from lying on your back to sitting on the side of a flat bed without using bedrails?: A Little Help needed moving to and from a bed to a chair (including a wheelchair)?: A Little Help needed standing up from a chair using your arms (e.g., wheelchair or bedside chair)?: A Little Help needed to walk in hospital room?: A Little Help needed climbing 3-5 steps with a railing? : A Little 6 Click Score: 18    End of Session Equipment Utilized During Treatment: Gait belt;Oxygen Activity Tolerance: Patient tolerated treatment well Patient left: in chair;with call bell/phone within reach;with chair alarm set;with family/visitor present Nurse Communication: Mobility status PT Visit Diagnosis: Difficulty in walking, not elsewhere classified (R26.2);Muscle weakness (generalized) (M62.81)    Time: 7902-4097 PT Time Calculation (min) (ACUTE ONLY): 31 min   Charges:   PT Evaluation $PT Eval Moderate Complexity: 1 Mod          D. Scott Maurianna Benard PT, DPT 10/17/22, 2:02 PM

## 2022-10-18 ENCOUNTER — Encounter: Payer: Medicare PPO | Admitting: Family

## 2022-10-18 ENCOUNTER — Other Ambulatory Visit: Payer: Self-pay | Admitting: Internal Medicine

## 2022-10-18 ENCOUNTER — Encounter: Payer: Self-pay | Admitting: Internal Medicine

## 2022-10-18 ENCOUNTER — Telehealth: Payer: Self-pay | Admitting: Internal Medicine

## 2022-10-18 ENCOUNTER — Telehealth: Payer: Self-pay | Admitting: *Deleted

## 2022-10-18 DIAGNOSIS — J9601 Acute respiratory failure with hypoxia: Secondary | ICD-10-CM

## 2022-10-18 NOTE — Telephone Encounter (Signed)
Kristin from [employer] called wanting to know about the referral that the provider put in. She wanted to know if it was for hospice care or palliative care

## 2022-10-18 NOTE — Progress Notes (Signed)
REFERRING TO PALLIATIVE CARE

## 2022-10-18 NOTE — Patient Outreach (Signed)
  Care Coordination [facility] Note Transition Care Management Unsuccessful Follow-up Telephone Call  Date of discharge and from where:  [facility] [phone]   Attempts:  1st Attempt  Reason for unsuccessful TCM follow-up call:  Left voice message  [facility] [phone]

## 2022-10-19 ENCOUNTER — Encounter: Payer: Self-pay | Admitting: *Deleted

## 2022-10-19 ENCOUNTER — Telehealth: Payer: Self-pay | Admitting: *Deleted

## 2022-10-19 NOTE — Telephone Encounter (Signed)
[facility] is aware.

## 2022-10-19 NOTE — Patient Outreach (Signed)
Care Coordination [facility] Note Transition Care Management Follow-up Telephone Call Date of discharge and from where: Tuesday, 10/17/22 [place], bilateral pleural effusion in setting of multiple myeloma How have you been since you were released from the hospital? "I am doing much better, not having any problems.  My daughter is staying with me at night and helping me if I need it, but I don't really need much help.  I am doing my own medicines, and she double-checks behind me.  We went over all the medicine changes from the hospital together and I am taking everything like I am supposed to.  I will be going to see my oncologist tomorrow, and if I don't hear back from the home health people, I will call you or have my daughter call you" Any questions or concerns? No  Items Reviewed: Did the pt receive and understand the discharge instructions provided? Yes  Reviewed thoroughly with patient; she verbalizes a good general understanding of of hospital discharge instructions and denies questions/ concerns Medications obtained and verified? Yes  verified post-hospital discharge medication changes; patient declines full medication review, stating she and her daughter went over all instructions at time of hospital discharge; confirms she normally self-manages medications, daughter assists as/ if indicated; she endorses 100% adherence to taking all medications as prescribed Other? No  Any new allergies since your discharge? No  Dietary orders reviewed? Yes Do you have support at home? Yes  patient reports she is essentially independent in self-care; reports daughter has been temporarily staying with her at night after recent hospitalization and assists as needed with care needs  Home Care and Equipment/Supplies: Were home health services ordered? yes If so, what is the name of the agency? [facility]- PT/ OT/ RN  Has the agency set up a time to come to the patient's home? No- provided phone numbers for [facility]; provided  my direct contact information as well, should she have difficulty contacting [facility] is she does not hear from them by tomorrow  Were any new equipment or medical supplies ordered?  No What is the name of the medical supply agency? N/A Were you able to get the supplies/equipment? not applicable Do you have any questions related to the use of the equipment or supplies? No N/A  Functional Questionnaire: (I = Independent and D = Dependent) ADLs: I  daughter assists as needed/ indicated  Bathing/Dressing- I  daughter assists as needed/ indicated  Meal Prep- I  daughter assists as needed/ indicated  Eating- I  Maintaining continence- I  Transferring/Ambulation- I  Managing Meds- I  daughter assists as needed/ indicated  Follow up appointments reviewed:  PCP Hospital f/u appt confirmed? No  Scheduled to see - on - @ [facility] f/u appt confirmed? Yes  Scheduled to see oncology provider on Friday 10/11/2022 @ 11:00 am Are transportation arrangements needed? No  If their condition worsens, is the pt aware to call PCP or go to the Emergency Dept.? Yes Was the patient provided with contact information for the PCP's office or ED? No- reports already has contact information for all care providers Was to pt encouraged to call back with questions or concerns? Yes- patient declines ongoing follow up with RN CM Care Coordinator; provided my direct phone number should questions/ concerns/ needs arise in the future  SDOH assessments and interventions completed:   Yes SDOH Interventions Today    Flowsheet Row Most Recent Value  SDOH Interventions   Food Insecurity Interventions Intervention Not Indicated  Transportation Interventions  Intervention Not Indicated  [daughter provides transportation]      Care Coordination Interventions:  Provided education around nature of/ process to get home health services initiated; provided contact information for home health team and provided my number  as well, should patient need assistance in the future    Encounter Outcome:  Pt. Visit Completed    Oneta Rack, RN, BSN, CCRN Alumnus RN CM Care Coordination/ Transition of [place] Management [phone]: direct office

## 2022-10-20 ENCOUNTER — Telehealth: Payer: Self-pay

## 2022-10-20 DIAGNOSIS — I509 Heart failure, unspecified: Secondary | ICD-10-CM | POA: Diagnosis not present

## 2022-10-20 NOTE — Telephone Encounter (Signed)
PC SW outreached patients daughter, Helene Kelp. To schedule initial PC visit/consult.   Visit scheduled for Tue 10/24/22 '@1130am'$  with RN/SW team.

## 2022-10-23 ENCOUNTER — Telehealth: Payer: Self-pay | Admitting: Cardiovascular Disease

## 2022-10-23 ENCOUNTER — Telehealth: Payer: Self-pay | Admitting: Internal Medicine

## 2022-10-23 MED ORDER — POTASSIUM CHLORIDE ER 10 MEQ PO TBCR: 20.0000 meq | EXTENDED_RELEASE_TABLET | Freq: Every day | ORAL | 3 refills | Status: AC

## 2022-10-23 MED ORDER — FUROSEMIDE 20 MG PO TABS: 20.0000 mg | ORAL_TABLET | Freq: Every day | ORAL | 3 refills | Status: AC

## 2022-10-23 NOTE — Telephone Encounter (Signed)
Spoke with pt's daughter and she stated that pt's health seems to be deteriorating more since her last hospital stay. Daughter stated that it is hard to get the pt to take her medications or eat. Pt has left a message with Cardiology as well because the pt is having some swelling in her legs and abdomen. Pt is now on oxygen 24/7 since leaving the hospital. Daughter is just wanting to make sure she is doing everything she can to help keep her mother from having to go back to the hospital. Palliative care team is going out to her home tomorrow at 11:30 and I have scheduled pt for a virtual with you tomorrow afternoon per daughter's request.

## 2022-10-23 NOTE — Telephone Encounter (Signed)
Pt c/o swelling: STAT is pt has developed SOB within 24 hours  If swelling, where is the swelling located?   Belly and legs  How much weight have you gained and in what time span?   No  Have you gained 3 pounds in a day or 5 pounds in a week?   No  Do you have a log of your daily weights (if so, list)?   Yes  Are you currently taking a fluid pill?  Yes  Are you currently SOB?   On exertion  Have you traveled recently?  No  Daughter stated patient is unable to sleep in her bed and had to sleep in her recliner in a position she can breath.  Daughter stated patient is on oxygen which was 92 at 2.5 liters.

## 2022-10-23 NOTE — Telephone Encounter (Signed)
Pt daughter called in staying that she would like to speak to Dr Derrel Nip or her assistant concerning her mom health. As per daughter, her mom was in the hospital last week Tuesday, and she is worried about her. She would like a call back '@[phone]'$ -[phone].

## 2022-10-23 NOTE — Telephone Encounter (Signed)
Pt's daughter called stating pt was recently admitted on 1/14 due to nose bleed and SOB. Pt was found to have bilateral pleural effusion. Thoracenteses was attempted, however, unsuccessful due to small fluid collection.  Daughter stated per to discharge, pt only used oxygen at night, but since discharge, pt is requiring 2 L continuously.   She reports pt is currently experiencing abdomen and bilateral leg swelling. She also reported pt hasn't been able to lay flat the past two nights. She denies significant weight increase. BP 128/57  Dr. Rockey Situ made aware and recommended pt wear compression stockings and increase lasix and potassium to 20 mg daily and track weights.  Pt's daughter made aware and verbalized understanding.

## 2022-10-23 NOTE — Telephone Encounter (Signed)
I contacted patient's daughter Helene Kelp by phone,  and reviewed her concerns .  Reviewed the reasons for unsuccessful thoracentesis. Addressed the orthopnea present.   She has already given Ms Worthey furosemide( per Dr Donivan Scull advice increased dose from qod to qd). Palliative Care is coming for first visit o Jan 23 at 11:30 and hospital bed will be ordered.

## 2022-10-24 ENCOUNTER — Encounter: Payer: Self-pay | Admitting: Internal Medicine

## 2022-10-24 ENCOUNTER — Other Ambulatory Visit: Payer: Self-pay

## 2022-10-24 ENCOUNTER — Telehealth (INDEPENDENT_AMBULATORY_CARE_PROVIDER_SITE_OTHER): Admitting: Internal Medicine

## 2022-10-24 VITALS — BP 126/49 | HR 79 | Ht 62.0 in | Wt 105.0 lb

## 2022-10-24 VITALS — HR 81

## 2022-10-24 DIAGNOSIS — J90 Pleural effusion, not elsewhere classified: Secondary | ICD-10-CM

## 2022-10-24 DIAGNOSIS — Z515 Encounter for palliative care: Secondary | ICD-10-CM

## 2022-10-24 DIAGNOSIS — C9000 Multiple myeloma not having achieved remission: Secondary | ICD-10-CM | POA: Diagnosis not present

## 2022-10-24 DIAGNOSIS — Z09 Encounter for follow-up examination after completed treatment for conditions other than malignant neoplasm: Secondary | ICD-10-CM

## 2022-10-24 NOTE — Progress Notes (Unsigned)
Virtual Visit via [place]   Note   This format is felt to be most appropriate for this patient at this time.  All issues noted in this document were discussed and addressed.  No physical exam was performed (except for noted visual exam findings with Video Visits).   I connected with Deanna White  on 10/24/22 at  4:30 PM EST by a video enabled telemedicine application and verified that I am speaking with the correct person using two identifiers. Location patient: home Location provider: work or home office Persons participating in the virtual visit: patient, provider and daughter Helene Kelp    I discussed the limitations, risks, security and privacy concerns of performing an evaluation and management service by telephone and the availability of in person appointments. I also discussed with the patient that there may be a patient responsible charge related to this service. The patient expressed understanding and agreed to proceed.  Reason for visit: hospital follow up respiratory failure, comfort care measures in place  [facility])  HPI:  Deanna White is a delightful 89 yr old female recently admitted to [facility] with acute hypoxic respiratory failure    ROS: See pertinent positives and negatives per HPI.  Past Medical History:  Diagnosis Date   Actinic keratosis    Allergy    Anemia 02/25/2015   Aortic stenosis    Arrhythmia    atrial fibrillation/ SVT   Basal cell carcinoma 06/01/2021   R temple - ED&C   Carotid artery occlusion    CHF (congestive heart failure) (HCC)    Colon polyps    Fall from slipping on ice 11/2013   Heart murmur    Hypertension    Menopause    Multiple myeloma ([place])    PAD (peripheral artery disease) ([place])    Pneumonia    Positive TB test    pt denies   Sore throat    Squamous cell carcinoma of skin 04/17/2019   L post lateral ankle, shave removal 10/15/2019   Thyroid disease    Varicose veins     Past Surgical History:  Procedure Laterality Date   ABDOMINAL  HYSTERECTOMY     APPENDECTOMY     BILATERAL SALPINGOOPHORECTOMY  1964   CESAREAN SECTION     EYE SURGERY Right March 2016   Cataract   LOWER EXTREMITY ANGIOGRAPHY Left 03/14/2019   Procedure: LOWER EXTREMITY ANGIOGRAPHY;  Surgeon: Katha Cabal, MD;  Location: [place] [facility];  Service: Cardiovascular;  Laterality: Left;   TONSILLECTOMY     VEIN SURGERY Left    Leg    Family History  Problem Relation Age of Onset   Heart disease Mother        After age 54   Hypertension Mother    Deep vein thrombosis Mother    Heart attack Mother    Kidney disease Father    Alcohol abuse Brother    Heart disease Brother    Cancer Brother        Lung cancer   Cancer Brother        kidney cancer   Varicose Veins Sister    Heart disease Sister        After age 26   Heart attack Sister    Heart disease Sister        After age 72   Other Other        epilepsy   Hypertension Other    Cancer Paternal Grandmother        breast cancer  Cancer Daughter     SOCIAL HX: ***   Current Outpatient Medications:    amLODipine (NORVASC) 5 MG tablet, Take 0.5 tab by mouth every AM, and take 1 tab  by mouth every PM, Disp: 60 tablet, Rfl: 3   ASPIRIN 81 PO, Take 81 mg by mouth daily., Disp: , Rfl:    cloNIDine (CATAPRES) 0.1 MG tablet, 1 tablet in the am and 1 In the evening, Disp: 270 tablet, Rfl: 2   dexamethasone (DECADRON) 4 MG tablet, Take 5 tablets (20 mg total) by mouth once a week. Take with food., Disp: 20 tablet, Rfl: 2   dorzolamide (TRUSOPT) 2 % ophthalmic solution, Place 1 drop into both eyes 3 (three) times daily. , Disp: , Rfl: 12   furosemide (LASIX) 20 MG tablet, Take 1 tablet (20 mg total) by mouth daily., Disp: 90 tablet, Rfl: 3   ketoconazole (NIZORAL) 2 % cream, Apply 1 Application topically at bedtime., Disp: 60 g, Rfl: 11   levothyroxine (SYNTHROID) 75 MCG tablet, TAKE 1 TABLET BY MOUTH EVERY DAY, Disp: 90 tablet, Rfl: 1   loratadine (CLARITIN) 10 MG tablet, Take 10 mg  by mouth at bedtime as needed for allergies (For the rash)., Disp: , Rfl:    losartan (COZAAR) 100 MG tablet, TAKE 1 TABLET BY MOUTH EVERY DAY, Disp: 90 tablet, Rfl: 1   metoprolol tartrate (LOPRESSOR) 25 MG tablet, Take 0.5 tablets (12.5 mg total) by mouth 2 (two) times daily., Disp: 30 tablet, Rfl: 2   Multiple Vitamins-Minerals (PRESERVISION AREDS 2) CAPS, Take 1 capsule by mouth 2 (two) times daily. , Disp: , Rfl:    potassium chloride (KLOR-CON) 10 MEQ tablet, Take 2 tablets (20 mEq total) by mouth daily., Disp: 180 tablet, Rfl: 3  EXAM:  VITALS per patient if applicable:  GENERAL: alert, oriented, appears well and in no acute distress  HEENT: atraumatic, conjunttiva clear, no obvious abnormalities on inspection of external nose and ears  NECK: normal movements of the head and neck  LUNGS: on inspection no signs of respiratory distress, breathing rate appears normal, no obvious gross SOB, gasping or wheezing  CV: no obvious cyanosis  MS: moves all visible extremities without noticeable abnormality  PSYCH/NEURO: pleasant and cooperative, no obvious depression or anxiety, speech and thought processing grossly intact  ASSESSMENT AND PLAN: There are no diagnoses linked to this encounter.    I discussed the assessment and treatment plan with the patient. The patient was provided an opportunity to ask questions and all were answered. The patient agreed with the plan and demonstrated an understanding of the instructions.   The patient was advised to call back or seek an in-person evaluation if the symptoms worsen or if the condition fails to improve as anticipated.   I spent 30 minutes dedicated to the care of this patient on the date of this encounter to include pre-visit review of his medical history,  Face-to-face time with the patient , and post visit ordering of testing and therapeutics.    Crecencio Mc, MD

## 2022-10-24 NOTE — Progress Notes (Signed)
PATIENT NAME: Deanna White DOB: Jul 07, 1925 MRN: [id number]  PRIMARY CARE PROVIDER: Crecencio Mc, MD  RESPONSIBLE PARTY:  Acct ID - Guarantor Home Phone Work Phone Relationship Acct Type  [id number] Kelton [phone]  Self P/F     [address], [place] [address]   Home visit completed with patient, Placido Sou, private caregiver and Lorelee Market, LPN.  ACP:  Reviewed current code status and patient confirms DNR.  She has the form in the chair with her.  Son placed this on the freezer where it can be easily accessible and visible.  Discussed medical goals and patient desires to remain home and have her symptoms managed.  She does not wish to be hospitalized any further.  Appetite:  Has been poor since return from the hospital. Encouraged to offer foods/liquids as patient is able to tolerate.  Hospice vs Palliative Care: Discussed both programs with patient.  Recommend hospice given what her goals are and patient is agreeable.  Secure message sent to PCP with request.  Approval received and [facility] Referral Intake has been notified.  Patient will be scheduled as soon as possible.  Home Health:  Patient is scheduled to be seen by [facility].  I have contacted the [facility] office to advise them of pending hospice referral.   Hospitalization:  Patient was hospitalized from 1/14-1/16/23 and found to have bilateral pleural effusion.  Attempted thoracentesis but fluid amount was to small.    Multiple Myeloma:  Treatment currently on hold due to abnormal lab values.   Respiratory:  Patient endorses difficulty breathing since her return home.  Lasix was increased to 20 mg daily yesterday by cardiology.  Patient was on 2 L via [place] this am.  This was increased to 5 L due to oxygen saturation in the low 80's.  Patient is currently at 5 L via [place] and oxygen is at 84%.  She is longer able to sleep in her bed due to difficulty breathing.  A wedge pillow was used but this was not  effective.  Patient is now remaining in her recliner chair to sleep at night.  Patient would benefit from hospital bed.   Weakness:  family notes worsening weakness since return home. Patient is mostly remaining in her recliner chair and often falling asleep.     CODE STATUS: DNR form in the home. ADVANCED DIRECTIVES: Not on file MOST FORM: No PPS: 40% weak   PHYSICAL EXAM:   VITALS: Today's Vitals   10/24/22 1147  Pulse: 81  SpO2: (!) 84%    LUNGS:  CTA with diminished breath sounds to right lower lobe. CARDIAC: Cor RRR}  EXTREMITIES: 1+ bilateral ankle edema SKIN: Skin color, texture, turgor normal. No rashes or lesions or dry   NEURO: positive for weakness       Lorenza Burton, RN

## 2022-10-25 ENCOUNTER — Other Ambulatory Visit: Payer: Self-pay

## 2022-10-25 ENCOUNTER — Telehealth: Payer: Self-pay | Admitting: Internal Medicine

## 2022-10-25 MED ORDER — MORPHINE SULFATE (CONCENTRATE) 20 MG/ML PO SOLN: 5.0000 mg | ORAL | 0 refills | Status: AC | PRN

## 2022-10-25 NOTE — Telephone Encounter (Signed)
Nicole Kindred called in in regards of pt [place], she said she would like for Dr. Derrel Nip to prescribed pt Morphine because pt its having sob, and oxygen levels are low. She's available '@[phone]'$ [phone] or [phone] if questions.

## 2022-10-25 NOTE — Telephone Encounter (Signed)
Liquid morphine rx sent to [facility].  Hospice RN contacted

## 2022-10-25 NOTE — Telephone Encounter (Signed)
Message was not marked high priority so did not see message until now.

## 2022-10-25 NOTE — Assessment & Plan Note (Signed)
She did not tolerate treatment due to profound pancytopenia

## 2022-10-25 NOTE — Assessment & Plan Note (Signed)
Patient is  declining  post discharge secondary to increasing hypoxemia. Palliative care has been upgraded to hospice care. Hospital bed,  increased oxygen support,  purewick catheter recommended to manage diuresis

## 2022-10-25 NOTE — Assessment & Plan Note (Signed)
Not amenable to thoracentesis.  Continue diuresis

## 2022-10-27 ENCOUNTER — Inpatient Hospital Stay: Payer: Medicare PPO

## 2022-10-27 ENCOUNTER — Inpatient Hospital Stay: Payer: Medicare PPO | Admitting: Oncology

## 2022-10-30 ENCOUNTER — Inpatient Hospital Stay: Payer: Medicare PPO

## 2022-11-01 ENCOUNTER — Other Ambulatory Visit: Payer: Self-pay | Admitting: Oncology

## 2022-11-01 DIAGNOSIS — C9000 Multiple myeloma not having achieved remission: Secondary | ICD-10-CM

## 2022-11-02 DEATH — deceased

## 2022-11-16 ENCOUNTER — Ambulatory Visit: Payer: Medicare PPO | Admitting: Podiatry

## 2022-11-27 ENCOUNTER — Ambulatory Visit: Payer: Medicare PPO | Admitting: Cardiovascular Disease

## 2023-06-27 ENCOUNTER — Encounter: Payer: Medicare PPO | Admitting: Dermatology
